# Patient Record
Sex: Female | Born: 1950 | Race: Black or African American | Hispanic: No | Smoking: Never smoker
Health system: Southern US, Community
[De-identification: ages and names within clinical notes are randomized; demographics above are authoritative.]

## PROBLEM LIST (undated history)

## (undated) DIAGNOSIS — R102 Pelvic and perineal pain unspecified side: Secondary | ICD-10-CM

## (undated) DIAGNOSIS — K219 Gastro-esophageal reflux disease without esophagitis: Secondary | ICD-10-CM

## (undated) DIAGNOSIS — F419 Anxiety disorder, unspecified: Secondary | ICD-10-CM

## (undated) DIAGNOSIS — I6529 Occlusion and stenosis of unspecified carotid artery: Secondary | ICD-10-CM

## (undated) DIAGNOSIS — Z9889 Other specified postprocedural states: Secondary | ICD-10-CM

## (undated) DIAGNOSIS — G8929 Other chronic pain: Secondary | ICD-10-CM

## (undated) DIAGNOSIS — K589 Irritable bowel syndrome without diarrhea: Secondary | ICD-10-CM

## (undated) DIAGNOSIS — K449 Diaphragmatic hernia without obstruction or gangrene: Secondary | ICD-10-CM

## (undated) DIAGNOSIS — K2970 Gastritis, unspecified, without bleeding: Secondary | ICD-10-CM

## (undated) DIAGNOSIS — R112 Nausea with vomiting, unspecified: Secondary | ICD-10-CM

## (undated) DIAGNOSIS — E039 Hypothyroidism, unspecified: Secondary | ICD-10-CM

## (undated) DIAGNOSIS — E669 Obesity, unspecified: Secondary | ICD-10-CM

## (undated) DIAGNOSIS — B9681 Helicobacter pylori [H. pylori] as the cause of diseases classified elsewhere: Secondary | ICD-10-CM

## (undated) DIAGNOSIS — I2510 Atherosclerotic heart disease of native coronary artery without angina pectoris: Secondary | ICD-10-CM

## (undated) HISTORY — DX: Pelvic and perineal pain unspecified side: R10.20

## (undated) HISTORY — DX: Anxiety disorder, unspecified: F41.9

## (undated) HISTORY — PX: COLONOSCOPY: SHX174

## (undated) HISTORY — DX: Gastro-esophageal reflux disease without esophagitis: K21.9

## (undated) HISTORY — PX: UPPER GASTROINTESTINAL ENDOSCOPY: SHX188

## (undated) HISTORY — DX: Obesity, unspecified: E66.9

## (undated) HISTORY — DX: Hypothyroidism, unspecified: E03.9

## (undated) HISTORY — DX: Irritable bowel syndrome without diarrhea: K58.9

## (undated) HISTORY — PX: OTHER SURGICAL HISTORY: SHX169

## (undated) HISTORY — DX: Pelvic and perineal pain: R10.2

## (undated) HISTORY — PX: LAPAROSCOPY: SHX197

## (undated) HISTORY — DX: Gastritis, unspecified, without bleeding: K29.70

## (undated) HISTORY — PX: SALPINGOOPHORECTOMY: SHX82

## (undated) HISTORY — DX: Other chronic pain: G89.29

## (undated) HISTORY — DX: Helicobacter pylori (H. pylori) as the cause of diseases classified elsewhere: B96.81

## (undated) HISTORY — PX: ABDOMINAL HYSTERECTOMY: SHX81

## (undated) HISTORY — DX: Diaphragmatic hernia without obstruction or gangrene: K44.9

---

## 1997-11-15 ENCOUNTER — Ambulatory Visit (HOSPITAL_COMMUNITY): Admission: RE | Admit: 1997-11-15 | Discharge: 1997-11-15 | Payer: Self-pay | Admitting: Gastroenterology

## 1998-08-14 ENCOUNTER — Other Ambulatory Visit: Admission: RE | Admit: 1998-08-14 | Discharge: 1998-08-14 | Payer: Self-pay | Admitting: *Deleted

## 1999-06-14 ENCOUNTER — Encounter: Admission: RE | Admit: 1999-06-14 | Discharge: 1999-06-14 | Payer: Self-pay | Admitting: Obstetrics and Gynecology

## 1999-06-14 ENCOUNTER — Encounter: Payer: Self-pay | Admitting: Obstetrics and Gynecology

## 1999-11-21 ENCOUNTER — Encounter: Payer: Self-pay | Admitting: Internal Medicine

## 1999-11-21 ENCOUNTER — Ambulatory Visit (HOSPITAL_COMMUNITY): Admission: RE | Admit: 1999-11-21 | Discharge: 1999-11-21 | Payer: Self-pay | Admitting: Internal Medicine

## 2000-06-15 ENCOUNTER — Encounter: Payer: Self-pay | Admitting: Obstetrics and Gynecology

## 2000-06-15 ENCOUNTER — Encounter: Admission: RE | Admit: 2000-06-15 | Discharge: 2000-06-15 | Payer: Self-pay | Admitting: Obstetrics and Gynecology

## 2001-03-30 ENCOUNTER — Emergency Department (HOSPITAL_COMMUNITY): Admission: EM | Admit: 2001-03-30 | Discharge: 2001-03-30 | Payer: Self-pay | Admitting: *Deleted

## 2001-05-25 ENCOUNTER — Encounter: Admission: RE | Admit: 2001-05-25 | Discharge: 2001-05-25 | Payer: Self-pay | Admitting: Internal Medicine

## 2001-05-25 ENCOUNTER — Encounter: Payer: Self-pay | Admitting: Internal Medicine

## 2001-06-15 ENCOUNTER — Other Ambulatory Visit: Admission: RE | Admit: 2001-06-15 | Discharge: 2001-06-15 | Payer: Self-pay | Admitting: Obstetrics and Gynecology

## 2001-06-18 ENCOUNTER — Encounter: Admission: RE | Admit: 2001-06-18 | Discharge: 2001-06-18 | Payer: Self-pay | Admitting: Obstetrics and Gynecology

## 2001-06-18 ENCOUNTER — Encounter: Admission: RE | Admit: 2001-06-18 | Discharge: 2001-06-18 | Payer: Self-pay | Admitting: Internal Medicine

## 2001-06-18 ENCOUNTER — Encounter: Payer: Self-pay | Admitting: Obstetrics and Gynecology

## 2001-06-30 DIAGNOSIS — K589 Irritable bowel syndrome without diarrhea: Secondary | ICD-10-CM

## 2001-06-30 DIAGNOSIS — K449 Diaphragmatic hernia without obstruction or gangrene: Secondary | ICD-10-CM

## 2001-06-30 HISTORY — DX: Irritable bowel syndrome, unspecified: K58.9

## 2001-06-30 HISTORY — DX: Diaphragmatic hernia without obstruction or gangrene: K44.9

## 2001-08-11 ENCOUNTER — Ambulatory Visit (HOSPITAL_COMMUNITY): Admission: RE | Admit: 2001-08-11 | Discharge: 2001-08-11 | Payer: Self-pay | Admitting: Internal Medicine

## 2001-08-18 ENCOUNTER — Ambulatory Visit (HOSPITAL_COMMUNITY): Admission: RE | Admit: 2001-08-18 | Discharge: 2001-08-18 | Payer: Self-pay | Admitting: Cardiology

## 2001-08-18 ENCOUNTER — Encounter: Payer: Self-pay | Admitting: Cardiology

## 2002-02-02 ENCOUNTER — Encounter: Payer: Self-pay | Admitting: Gastroenterology

## 2002-02-02 ENCOUNTER — Encounter: Admission: RE | Admit: 2002-02-02 | Discharge: 2002-02-02 | Payer: Self-pay | Admitting: Gastroenterology

## 2002-03-29 ENCOUNTER — Observation Stay (HOSPITAL_COMMUNITY): Admission: RE | Admit: 2002-03-29 | Discharge: 2002-03-30 | Payer: Self-pay | Admitting: General Surgery

## 2002-06-21 ENCOUNTER — Encounter: Payer: Self-pay | Admitting: Obstetrics and Gynecology

## 2002-06-21 ENCOUNTER — Encounter: Admission: RE | Admit: 2002-06-21 | Discharge: 2002-06-21 | Payer: Self-pay | Admitting: Obstetrics and Gynecology

## 2002-06-21 ENCOUNTER — Other Ambulatory Visit: Admission: RE | Admit: 2002-06-21 | Discharge: 2002-06-21 | Payer: Self-pay | Admitting: Obstetrics and Gynecology

## 2002-12-05 ENCOUNTER — Encounter: Payer: Self-pay | Admitting: Internal Medicine

## 2002-12-05 ENCOUNTER — Encounter: Admission: RE | Admit: 2002-12-05 | Discharge: 2002-12-05 | Payer: Self-pay | Admitting: Internal Medicine

## 2003-06-14 ENCOUNTER — Other Ambulatory Visit: Admission: RE | Admit: 2003-06-14 | Discharge: 2003-06-14 | Payer: Self-pay | Admitting: Obstetrics and Gynecology

## 2003-07-19 ENCOUNTER — Encounter: Admission: RE | Admit: 2003-07-19 | Discharge: 2003-07-19 | Payer: Self-pay | Admitting: Obstetrics and Gynecology

## 2003-10-27 ENCOUNTER — Ambulatory Visit (HOSPITAL_COMMUNITY): Admission: RE | Admit: 2003-10-27 | Discharge: 2003-10-27 | Payer: Self-pay | Admitting: Internal Medicine

## 2003-10-27 ENCOUNTER — Encounter (INDEPENDENT_AMBULATORY_CARE_PROVIDER_SITE_OTHER): Payer: Self-pay | Admitting: *Deleted

## 2004-02-15 ENCOUNTER — Encounter: Admission: RE | Admit: 2004-02-15 | Discharge: 2004-02-15 | Payer: Self-pay | Admitting: Obstetrics & Gynecology

## 2004-08-19 ENCOUNTER — Encounter: Admission: RE | Admit: 2004-08-19 | Discharge: 2004-08-19 | Payer: Self-pay | Admitting: Obstetrics and Gynecology

## 2005-08-01 ENCOUNTER — Encounter: Admission: RE | Admit: 2005-08-01 | Discharge: 2005-08-01 | Payer: Self-pay | Admitting: Obstetrics and Gynecology

## 2005-08-08 ENCOUNTER — Encounter: Admission: RE | Admit: 2005-08-08 | Discharge: 2005-08-08 | Payer: Self-pay | Admitting: Obstetrics and Gynecology

## 2005-08-26 ENCOUNTER — Ambulatory Visit: Payer: Self-pay | Admitting: Internal Medicine

## 2005-08-27 ENCOUNTER — Ambulatory Visit: Payer: Self-pay | Admitting: Internal Medicine

## 2005-08-27 ENCOUNTER — Encounter (INDEPENDENT_AMBULATORY_CARE_PROVIDER_SITE_OTHER): Payer: Self-pay | Admitting: Specialist

## 2006-08-20 ENCOUNTER — Encounter: Admission: RE | Admit: 2006-08-20 | Discharge: 2006-08-20 | Payer: Self-pay | Admitting: Obstetrics and Gynecology

## 2006-09-14 ENCOUNTER — Ambulatory Visit (HOSPITAL_COMMUNITY): Admission: RE | Admit: 2006-09-14 | Discharge: 2006-09-14 | Payer: Self-pay | Admitting: Gastroenterology

## 2006-09-14 ENCOUNTER — Ambulatory Visit: Payer: Self-pay | Admitting: Gastroenterology

## 2006-09-16 ENCOUNTER — Emergency Department (HOSPITAL_COMMUNITY): Admission: EM | Admit: 2006-09-16 | Discharge: 2006-09-16 | Payer: Self-pay | Admitting: Emergency Medicine

## 2007-07-01 DIAGNOSIS — E039 Hypothyroidism, unspecified: Secondary | ICD-10-CM

## 2007-07-01 HISTORY — DX: Hypothyroidism, unspecified: E03.9

## 2007-09-09 ENCOUNTER — Encounter: Admission: RE | Admit: 2007-09-09 | Discharge: 2007-09-09 | Payer: Self-pay | Admitting: Obstetrics and Gynecology

## 2007-09-22 ENCOUNTER — Encounter: Admission: RE | Admit: 2007-09-22 | Discharge: 2007-09-22 | Payer: Self-pay | Admitting: Family Medicine

## 2008-05-24 ENCOUNTER — Ambulatory Visit (HOSPITAL_COMMUNITY): Admission: RE | Admit: 2008-05-24 | Discharge: 2008-05-24 | Payer: Self-pay | Admitting: Family Medicine

## 2008-09-11 ENCOUNTER — Encounter: Admission: RE | Admit: 2008-09-11 | Discharge: 2008-09-11 | Payer: Self-pay | Admitting: Obstetrics and Gynecology

## 2009-01-18 ENCOUNTER — Emergency Department (HOSPITAL_COMMUNITY): Admission: EM | Admit: 2009-01-18 | Discharge: 2009-01-18 | Payer: Self-pay | Admitting: Emergency Medicine

## 2009-06-07 ENCOUNTER — Encounter (INDEPENDENT_AMBULATORY_CARE_PROVIDER_SITE_OTHER): Payer: Self-pay | Admitting: *Deleted

## 2009-06-07 ENCOUNTER — Ambulatory Visit: Payer: Self-pay | Admitting: Gastroenterology

## 2009-06-07 ENCOUNTER — Telehealth (INDEPENDENT_AMBULATORY_CARE_PROVIDER_SITE_OTHER): Payer: Self-pay

## 2009-06-07 DIAGNOSIS — R1013 Epigastric pain: Secondary | ICD-10-CM

## 2009-06-07 DIAGNOSIS — R1310 Dysphagia, unspecified: Secondary | ICD-10-CM | POA: Insufficient documentation

## 2009-06-07 DIAGNOSIS — R1084 Generalized abdominal pain: Secondary | ICD-10-CM | POA: Insufficient documentation

## 2009-06-07 DIAGNOSIS — K3189 Other diseases of stomach and duodenum: Secondary | ICD-10-CM | POA: Insufficient documentation

## 2009-06-07 DIAGNOSIS — K219 Gastro-esophageal reflux disease without esophagitis: Secondary | ICD-10-CM | POA: Insufficient documentation

## 2009-06-08 ENCOUNTER — Encounter: Payer: Self-pay | Admitting: Gastroenterology

## 2009-06-18 ENCOUNTER — Ambulatory Visit: Payer: Self-pay | Admitting: Gastroenterology

## 2009-06-18 ENCOUNTER — Ambulatory Visit (HOSPITAL_COMMUNITY): Admission: RE | Admit: 2009-06-18 | Discharge: 2009-06-18 | Payer: Self-pay | Admitting: Gastroenterology

## 2009-06-21 ENCOUNTER — Encounter: Payer: Self-pay | Admitting: Gastroenterology

## 2009-06-28 ENCOUNTER — Telehealth (INDEPENDENT_AMBULATORY_CARE_PROVIDER_SITE_OTHER): Payer: Self-pay

## 2009-06-28 ENCOUNTER — Telehealth (INDEPENDENT_AMBULATORY_CARE_PROVIDER_SITE_OTHER): Payer: Self-pay | Admitting: *Deleted

## 2009-07-31 ENCOUNTER — Ambulatory Visit: Payer: Self-pay | Admitting: Gastroenterology

## 2009-08-20 ENCOUNTER — Ambulatory Visit (HOSPITAL_COMMUNITY): Admission: RE | Admit: 2009-08-20 | Discharge: 2009-08-20 | Payer: Self-pay | Admitting: Family Medicine

## 2009-09-13 ENCOUNTER — Encounter: Admission: RE | Admit: 2009-09-13 | Discharge: 2009-09-13 | Payer: Self-pay | Admitting: Obstetrics and Gynecology

## 2009-11-05 ENCOUNTER — Encounter: Payer: Self-pay | Admitting: Physician Assistant

## 2009-11-08 ENCOUNTER — Encounter: Payer: Self-pay | Admitting: Physician Assistant

## 2009-11-08 ENCOUNTER — Encounter: Admission: RE | Admit: 2009-11-08 | Discharge: 2009-11-08 | Payer: Self-pay | Admitting: Family Medicine

## 2009-11-09 ENCOUNTER — Ambulatory Visit: Payer: Self-pay | Admitting: Family Medicine

## 2009-11-09 DIAGNOSIS — E039 Hypothyroidism, unspecified: Secondary | ICD-10-CM | POA: Insufficient documentation

## 2009-11-09 DIAGNOSIS — R259 Unspecified abnormal involuntary movements: Secondary | ICD-10-CM | POA: Insufficient documentation

## 2009-11-13 ENCOUNTER — Telehealth: Payer: Self-pay | Admitting: Physician Assistant

## 2009-12-13 ENCOUNTER — Encounter (INDEPENDENT_AMBULATORY_CARE_PROVIDER_SITE_OTHER): Payer: Self-pay | Admitting: *Deleted

## 2009-12-17 ENCOUNTER — Encounter: Payer: Self-pay | Admitting: Physician Assistant

## 2009-12-17 ENCOUNTER — Ambulatory Visit: Payer: Self-pay | Admitting: Family Medicine

## 2009-12-17 DIAGNOSIS — G479 Sleep disorder, unspecified: Secondary | ICD-10-CM | POA: Insufficient documentation

## 2009-12-17 DIAGNOSIS — F411 Generalized anxiety disorder: Secondary | ICD-10-CM | POA: Insufficient documentation

## 2010-01-01 ENCOUNTER — Ambulatory Visit: Admission: RE | Admit: 2010-01-01 | Discharge: 2010-01-01 | Payer: Self-pay | Admitting: Family Medicine

## 2010-01-16 ENCOUNTER — Telehealth: Payer: Self-pay | Admitting: Family Medicine

## 2010-02-11 ENCOUNTER — Ambulatory Visit: Payer: Self-pay | Admitting: Gastroenterology

## 2010-02-11 DIAGNOSIS — R109 Unspecified abdominal pain: Secondary | ICD-10-CM | POA: Insufficient documentation

## 2010-03-12 ENCOUNTER — Ambulatory Visit: Payer: Self-pay | Admitting: Family Medicine

## 2010-03-12 DIAGNOSIS — M542 Cervicalgia: Secondary | ICD-10-CM | POA: Insufficient documentation

## 2010-03-12 DIAGNOSIS — M79609 Pain in unspecified limb: Secondary | ICD-10-CM | POA: Insufficient documentation

## 2010-03-14 ENCOUNTER — Encounter: Payer: Self-pay | Admitting: Physician Assistant

## 2010-04-10 ENCOUNTER — Telehealth (INDEPENDENT_AMBULATORY_CARE_PROVIDER_SITE_OTHER): Payer: Self-pay

## 2010-05-02 ENCOUNTER — Telehealth: Payer: Self-pay | Admitting: Family Medicine

## 2010-05-15 ENCOUNTER — Encounter (INDEPENDENT_AMBULATORY_CARE_PROVIDER_SITE_OTHER): Payer: Self-pay | Admitting: *Deleted

## 2010-06-11 ENCOUNTER — Ambulatory Visit: Payer: Self-pay | Admitting: Gastroenterology

## 2010-06-11 DIAGNOSIS — R1013 Epigastric pain: Secondary | ICD-10-CM | POA: Insufficient documentation

## 2010-07-26 ENCOUNTER — Encounter: Payer: Self-pay | Admitting: Gastroenterology

## 2010-08-01 NOTE — Letter (Signed)
Summary: Out of Work Note  [facility]  [address]   [place], [place] [address]   Phone: [phone]  Fax: [phone]    06/07/2009  TO: WHOM IT MAY CONCERN  RE: Kim Grimes [address] [place] 01/03/1951       The above named individual is currently under my care and will be out of work    FROM: 06/07/2009   THROUGH: 06/08/2009    REASON: Under Doctor's care.    MAY RETURN ON: 06/08/09     If you have any further questions or need additional information, please call.     Sincerely,     [facility] R. Roetta Sessions, M.D.    Kassie Mends, M.D. Lorenza Burton, FNP-BC    Tana Coast, PA-C Phone: [phone]    Fax: [phone]

## 2010-08-01 NOTE — Assessment & Plan Note (Signed)
Summary: EPIGSTRIC PAIN,GERD   Visit Type:  Follow-up Visit Primary Care Provider:  Lodema Hong, M.D.  Chief Complaint:  follow up visit- still having burning in throat and stomach and pain when eating.  History of Present Illness: C/o burning in her stomach after eating. Feeling full after eating. Nausea: 1-2x/week. No vomiting. Feels like things bubble up. Sun: only had salmon and baked potatoes, calorie: Dr. Reino Kent, [place] Si Tea: Verdis Frederickson, Caffeine. Felt baking soda made her feel better. PT CONCERNED HER HIATAL HERNIA IS CAUSING PAIN.  Current Medications (verified): 1)  Estradiol 1 Mg Tabs (Estradiol) .... One Tab By Mouth Once Daily 2)  Levothroid 75 Mcg Tabs (Levothyroxine Sodium) .... One Tab By Mouth Once Daily 3)  Norel Sr 8-50-40-325 Mg Xr12h-Tab (Chlorphen-Phenyltolox-Pe-Apap) .... One Tab By Mouth Two Times A Day For Congestion As Needed 4)  Diazepam 2 Mg Tabs (Diazepam) .... Take 1-2 Tabs Every 8 Hrs As Needed For Muscle Spasms 5)  Paroxetine Hcl 20 Mg Tabs (Paroxetine Hcl) .... Take 1 At Bedtime As Needed 6)  Lansoprazole 30 Mg Cpdr (Lansoprazole) .... Two Times A Day  Allergies (verified): 1)  ! Steroids 2)  ! Valium  Past History:  Past Surgical History: Last updated: 06/07/2009 Hysterectomy 2003 LAPAROSCOPY/ADHESIOLYSIS: dense adhesion between rectum and sigmoid, & pelvis SALPINGOOPHORECTOMY Bilateral R FOOT SX   Past Medical History: IBS 2003 Anxiety Disorder Endometriosis/CHRONIC PELVIC PAIN OSTEOPOROSIS H. pylori gastritis (Dr. Juanda Chance) HIATAL HERNIA ON UGI 2003 TCS 2003: abd pain/diarhea-WNLS TCS 2008: WNLs Hypothyroidism GERD  Vital Signs:  Patient profile:   60 year old female Height:      59 inches Weight:      145 pounds BMI:     29.39 Temp:     97.7 degrees F oral Pulse rate:   64 / minute BP sitting:   104 / 78  (left arm) Cuff size:   regular  Vitals Entered By: Hendricks Limes LPN (June 11, 2010 8:23 AM)  Physical Exam  General:  Well  developed, well nourished, no acute distress. Head:  Normocephalic and atraumatic. Lungs:  Clear throughout to auscultation. Heart:  Regular rate and rhythm; no murmurs. Abdomen:  Soft, mild TTP in epigastrium and suprapubic area w/o rebound or guarding, nondistended. No masses, hepatosplenomegaly or hernias noted. Normal bowel sounds.  Impression & Recommendations:  Problem # 1:  EPIGASTRIC PAIN (ICD-789.06) Assessment Unchanged likley 2o to IBS and/or uncontrolled GERD. The differential diagnosis includes non-ulcer dypspesia, OR less likely intermittent SBO from adhesions. Weight stable. Pt advised against caffeine and carbonated beverages. Continue two times a day PPI and Paroxetine.   Problem # 2:  GERD (ICD-530.81) Assessment: Improved Low fat diet. two times a day PPI. OPV in 6 mos.  CC: PCP  Appended Document: Orders Update    Clinical Lists Changes  Orders: Added new Service order of Est. Patient Level III (95284) - Signed

## 2010-08-01 NOTE — Progress Notes (Signed)
Summary: reflux and hemorroids  Phone Note Call from Patient Call back at Home Phone [phone]   Caller: Patient Summary of Call: pt called- she is having alot of burning in her throat. she is taking omeprazole three times a day now and its not helping. wants to know if she can try something else. pt has already tried nexium and her insurance will not pay for aciphex. She has BCBS and it will only pay for generics.   pt also got constipated last week and had bleeding with her hemorroids x 1 day. She is having alot of burning now with bm.   Pt would like generic ppi and something for hemorroids sent to [facility] [address] [place]. please advise Initial call taken by: Hendricks Limes LPN,  April 10, 2010 2:01 PM     Appended Document: reflux and hemorroids Please call pt. She may use Prevacid 30 mg 30 minutes before meals for two times a day one month then one 30 minutes before her first meal, #60 rfx5. She should follow a full lqiuid diet for 3 days the a low fat diet. For her hemorrhoidal pain she may have Anusol HC 1 pr two times a day for 10 days, #20, rfx0.  Appended Document: reflux and hemorroids Rx called to [facility] [place]-[phone]  Appended Document: reflux and hemorroids pt called- [facility]- call to [phone]. Rxs called in

## 2010-08-01 NOTE — Letter (Signed)
Summary: EGD/ED ORDER  EGD/ED ORDER   Imported By: Ave Filter 06/08/2009 13:08:06  _____________________________________________________________________  External Attachment:    Type:   Image     Comment:   External Document

## 2010-08-01 NOTE — Progress Notes (Signed)
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 can she have a statement that it is not safe? Additional Follow-up by: Adella Hare LPN,  May 02, 2010 2:31 PM    Additional Follow-up for Phone Call Additional follow up Details #2::    pls let her know no neurology repts on file to establish this , she  needs this dx established review of record stated that this was to be obtained. Alternatively to get done faster I suggest she just call the neurologist directly for the letter Follow-up by: [name] MD,  May 02, 2010 5:21 PM  Additional Follow-up for Phone Call Additional follow up Details #3:: Details for Additional Follow-up Action Taken: patient aware, states that [facility] had put her on paroxetine, wants to know if it is safe i did advise to call neurologist but she is still asking questions Additional Follow-up by: Adella Hare LPN,  May 03, 2010 3:57 PM  she needs to have all "safety" questions re her job answered by the neurologist patient calling neurologist Adella Hare LPN  May 06, 2010 2:49 PM

## 2010-08-01 NOTE — Progress Notes (Signed)
Summary: CTA Request  ---- Converted from flag ---- ---- 06/28/2009 10:16 AM, West Bali MD wrote: No will wait for pt's OPV to reassess.  ---- 06/27/2009 4:44 PM, Ave Filter wrote: Your office note states pt needs CTA pancreatic protocol..Do you still want her to have this done? ------------------------------

## 2010-08-01 NOTE — Progress Notes (Signed)
Summary: pt seeing two family practices  Phone Note Call from Patient   Summary of Call: this pt just saw dr. hill on 11/05/2009. he is family practice to. so she is seeing [place] and him  shenika from dr. hills office called to let us know.  Initial call taken by: Rudene Anda,  Nov 13, 2009 9:21 AM  Follow-up for Phone Call        Pls call them back & let them know I think she came here more out of fear & for another opinion.   If she comes to [place] again I'll let her know she needs to choose, & can't have 2 family practice docs. Follow-up by: Esperanza Sheets PA,  Nov 13, 2009 11:56 AM  Additional Follow-up for Phone Call Additional follow up Details #1::        called dr. hills office and explained and they are okay with this.  Additional Follow-up by: Rudene Anda,  Nov 13, 2009 1:40 PM

## 2010-08-01 NOTE — Assessment & Plan Note (Signed)
Summary: GERD/ABD PAIN   Visit Type:  Follow-up Visit Primary Care Provider:  Mirna Mires, M.D.  Chief Complaint:  F/U GERD/ABD PAIN.  History of Present Illness: Been exercising. Lost 5 lbs. Concerned about pain in left flank. Counting calories. Gave up coffee. Thyroid medicine adjusted. Taking omeprazole two times a day. Wants more probiotics GDF.  Current Medications (verified): 1)  Fosamax 70 Mg Tabs (Alendronate Sodium) .Marland Kitchen.. 1 Oral Tablet Once A Week 2)  Estradiol .... 1mg   By Mouth Once Daily 3)  Omeprazole 20 Mg Cpdr (Omeprazole) .... Take 1 Tablet By Mouth 30 Minutes Before Breakfast and Supper 4)  Synthroid 88 Mcg Tabs (Levothyroxine Sodium) .... Take 1 Tablet By Mouth Once A Day  Allergies (verified): No Known Drug Allergies  Past History:  Past Medical History: Last updated: 06/07/2009 IBS 2003 Anxiety Disorder Endometriosis/CHRONIC PELVIC PAIN OSTEOPOROSIS H. pylori gastritis (Dr. Juanda Chance) TCS 2003: abd pain/diarhea-WNLS TCS 2008: WNLs  Vital Signs:  Patient profile:   60 year old female Height:      58.5 inches Weight:      143 pounds BMI:     29.48 Temp:     98.1 degrees F oral Pulse rate:   72 / minute BP sitting:   120 / 80  (right arm) Cuff size:   regular  Vitals Entered By: Cloria Spring LPN (July 31, 2009 9:48 AM)  Physical Exam  General:  Well developed, well nourished, no acute distress. Head:  Normocephalic and atraumatic. Lungs:  Clear throughout to auscultation. Heart:  Regular rate and rhythm; no murmurs Abdomen:  Soft, nondistended. No masses, or hernias noted. Normal bowel sounds. Mild TTP in LUQ without guarding and without rebound.    Impression & Recommendations:  Problem # 1:  GASTROESOPHAGEAL REFLUX DISEASE (ICD-530.81) Assessment Improved  Continue OMP two times a day. OPV in 4 mos.  Orders: Est. Patient Level II (04540)  Problem # 2:  ABDOMINAL PAIN, GENERALIZED, CHRONIC (ICD-789.07) Assessment: Unchanged  c/o  Left flank pain and mild LUQ TTP likely 2o to increased physical activity. DA-GDF #32 given.  CC: PCP  Orders: Est. Patient Level II ([id number])

## 2010-08-01 NOTE — Assessment & Plan Note (Signed)
Summary: office visit   Vital Signs:  Patient profile:   60 year old female Height:      59 inches Weight:      144.75 pounds O2 Sat:      98 % on Room air Pulse rate:   73 / minute BP sitting:   122 / 78  (right arm) CC: follow up and states her left arm hurt for about a week and her face went a little numb, not feeling this way now, off and on for awhile, not sure if its how she is sitting at work or not. She is also interested in getting checked for diabeties it runs in her family. room 1 Is Patient Diabetic? No   Primary Provider:  Esperanza Sheets, PA  CC:  follow up and states her left arm hurt for about a week and her face went a little numb, not feeling this way now, off and on for awhile, and not sure if its how she is sitting at work or not. She is also interested in getting checked for diabeties it runs in her family. room 1.  History of Present Illness: Pt reports pain in her entire Lt arm from shoulder to fingers last week.  Is better this week.  Seemed to be aggrevated by leaning on Lt arm.  Also had numbness in the Lt side of her face last week.  Pt states the symptoms were persistent, not episodic.  She had normal sensation to touch in her face, but states it felt numb when she wasnt touching it.  She denies chest pain, palpitations, difficulty breathing, or diaphoresis. Hx of intermittent neck pain.  Pt points to Lt C6-7 area.  No trauman.  Also recently dx with anxiety d/o.  Had muscle jerking episodes.  Head CT and neurological eval neg.  Was told by neurologist that this was due to anxiety and no need to return. She has a prescription for Paxil but is not taking it.  States the episodes of muscle jerking have improved.  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    Allergies: 1)  ! Steroids 2)  ! Valium  Past History:  Past medical history reviewed for relevance to current acute and chronic problems.  Past Medical  History: Reviewed history from 11/09/2009 and no changes required. IBS 2003 Anxiety Disorder Endometriosis/CHRONIC PELVIC PAIN OSTEOPOROSIS H. pylori gastritis (Dr. Juanda Chance) TCS 2003: abd pain/diarhea-WNLS TCS 2008: WNLs Hypothyroidism GERD  Review of Systems General:  Denies chills and fever. CV:  Denies chest pain or discomfort, lightheadness, and palpitations. Resp:  Denies shortness of breath. MS:  Complains of muscle aches; denies joint pain, joint swelling, loss of strength, low back pain, and mid back pain. Neuro:  Complains of numbness; denies headaches, tingling, and visual disturbances. Psych:  Complains of anxiety.  Physical Exam  General:  Well-developed,well-nourished,in no acute distress; alert,appropriate and cooperative throughout examination Head:  Normocephalic and atraumatic without obvious abnormalities. No apparent alopecia or balding. Ears:  External ear exam shows no significant lesions or deformities.  Otoscopic examination reveals clear canals, tympanic membranes are intact bilaterally without bulging, retraction, inflammation or discharge. Hearing is grossly normal bilaterally. Nose:  External nasal examination shows no deformity or inflammation. Nasal mucosa are pink and moist without lesions or exudates. Mouth:  Oral mucosa and oropharynx without lesions or exudates.   Neck:  No deformities, masses, or tenderness noted. Lungs:  Normal respiratory effort, chest expands symmetrically.  Lungs are clear to auscultation, no crackles or wheezes. Heart:  Normal rate and regular rhythm. S1 and S2 normal without gallop, murmur, click, rub or other extra sounds. Msk:  LUE:  normal ROM, no joint tenderness, no joint swelling, no joint deformities, no joint instability, and no crepitation.   CSPine:  mild TTP Lt C6-7.  FROM. Pulses:  R radial normal and L radial normal.   Extremities:  No clubbing, cyanosis, edema, or deformity noted with normal full range of motion of  all joints.   Neurologic:  alert & oriented X3, strength normal in all extremities, sensation intact to light touch, gait normal, and DTRs symmetrical and normal.   Skin:  Intact without suspicious lesions or rashes Cervical Nodes:  No lymphadenopathy noted Psych:  Cognition and judgment appear intact. Alert and cooperative with normal attention span and concentration. No apparent delusions, illusions, hallucinations   Impression & Recommendations:  Problem # 1:  NECK PAIN, CHRONIC (ICD-723.1) Assessment Comment Only  Orders: Miscellaneous Other Radiology (Misc Other Rad)  Problem # 2:  ARM PAIN, LEFT (ICD-729.5) Assessment: Improved  Orders: EKG w/ Interpretation (93000)  Problem # 3:  ANXIETY DISORDER (ICD-300.00) Assessment: Unchanged Encouraged pt to take Paxil daily.  Her updated medication list for this problem includes:    Diazepam 2 Mg Tabs (Diazepam) .Marland Kitchen... Take 1-2 tabs every 8 hrs as needed for muscle spasms    Paroxetine Hcl 20 Mg Tabs (Paroxetine hcl) .Marland Kitchen... Take 1 at bedtime as needed  Complete Medication List: 1)  Omeprazole 20 Mg Cpdr (Omeprazole) .... One cap by mouth two times a day 2)  Estradiol 1 Mg Tabs (Estradiol) .... One tab by mouth once daily 3)  Levothroid 75 Mcg Tabs (Levothyroxine sodium) .... One tab by mouth once daily 4)  Norel Sr 8-50-40-325 Mg Xr12h-tab (Chlorphen-phenyltolox-pe-apap) .... One tab by mouth two times a day for congestion 5)  Diazepam 2 Mg Tabs (Diazepam) .... Take 1-2 tabs every 8 hrs as needed for muscle spasms 6)  Paroxetine Hcl 20 Mg Tabs (Paroxetine hcl) .... Take 1 at bedtime as needed  Patient Instructions: 1)  Please schedule a follow-up appointment in 1 month. 2)  Have xray of your neck done today. 3)  Take Tylenol or Aleve as needed for neck or Lt arm pain. 4)  Take your Paroxetine every day at bedtime.  This is for anxiety.

## 2010-08-01 NOTE — Progress Notes (Signed)
Summary: sleep study  Phone Note Call from Patient   Summary of Call: results of sleep study [phone] ext [phone] Initial call taken by: Rudene Anda,  January 16, 2010 10:05 AM  Follow-up for Phone Call        patient aware study was unremarkable Follow-up by: Adella Hare LPN,  January 16, 2010 11:46 AM

## 2010-08-01 NOTE — Assessment & Plan Note (Signed)
Summary: DIFFICULTY SWALLOWING & ABD PAIN- CG   Visit Type:  Follow-up Visit Primary Care Provider:  Mirna Mires, M.D.  Chief Complaint:  difficulty swallowing/ abd pain.  History of Present Illness: Pt c/o bloating and regurgitation. Sx been bothering her for 1-2 years. Also c/o difficulty swalllowing. Insurance won't pay for Aciphex. Feels heartburn: fluid bubbling back up. When swallows solid food, it hurts in middle underneath ribs and then liquid comes. Sx usu last  ~5-10 minutes. Has Sx whenever she eats. Sx have gotten worse over the past 5 years. Nausea: twice a week. No vomiting or weight loss. Exercises 3-4 x/wk and sometimes 2x/day. Been on Fosamax over 5 years. Sometimes triggers a bowel movement.  Bowels have changed. Stools were pasty, normal, or diarrhea. Stoppped spicy. Can't tie it down to a food. No milk. Eats ice cream 2x/mo/ Cheese: 2x/mo. Can't drink Pepsi or Coca Cola. Pain in LLQ with the diarrhea and it has happened for about 20 years ago after she had a hysterectomy. No blood in stool, no black tarry stool.   No ASA, BC, Goody's, or Ibuprofen. Takes Aleve: last dose over one year ago.  Preventive Screening-Counseling & Management  Alcohol-Tobacco     Smoking Status: never  Current Medications (verified): 1)  Fosamax 70 Mg Tabs (Alendronate Sodium) .Marland Kitchen.. 1 Oral Tablet Once A Week 2)  Estradiol .... 1mg   By Mouth Once Daily 3)  Omeprazole 20 Mg Cpdr (Omeprazole) .... Take 1 Tablet By Mouth Once A Day 4)  Synthroid 75 Mcg Tabs (Levothyroxine Sodium) .... Take 1 Tablet By Mouth Once A Day  Allergies (verified): No Known Drug Allergies  Past History:  Past Medical History: IBS 2003 Anxiety Disorder Endometriosis/CHRONIC PELVIC PAIN OSTEOPOROSIS H. pylori gastritis (Dr. Juanda Chance) TCS 2003: abd pain/diarhea-WNLS TCS 2008: WNLs  Past Surgical History: Hysterectomy 2003 LAPAROSCOPY/ADHESIOLYSIS: dense adhesion between rectum and sigmoid, &  pelvis SALPINGOOPHORECTOMY Bilateral R FOOT SX   Family History: FH of Colon Cancer: DAD, age < 5 yo Family History of Pancreatic Cancer: MOTHER  Social History: Occupation: [employer] Patient has never smoked.  Alcohol Use - wine 2x/year 1 kid: 89, divorced Smoking Status:  never  Review of Systems       CT A/P w/ ivc: 2003, NAIAP, U/S: 2007-RIGHT HEPATIC CYST, NL PANCREAS  Per HPI otherwise all systems are negative.  Vital Signs:  Patient profile:   60 year old female Height:      58 inches Weight:      148.50 pounds BMI:     31.15 Temp:     98.0 degrees F oral Pulse rate:   72 / minute BP sitting:   110 / 80  (left arm) Cuff size:   regular  Vitals Entered By: Cloria Spring LPN (June 07, 2009 8:57 AM)  Physical Exam  General:  Well developed, well nourished, no acute distress. Head:  Normocephalic and atraumatic. Eyes:  PERRLA, no icterus. Mouth:  No deformity or lesions. Neck:  Supple; no masses. Lungs:  Clear throughout to auscultation. Heart:  Regular rate and rhythm; no murmurs, rubs,  or bruits. Abdomen:  Soft, nondistended. No masses noted. Normal bowel sounds. obese.  generalized TTP most pronounced in the epigastrium without rebound or guarding. Msk:  Symmetrical with no gross deformities. Normal posture. Extremities:  No cyanosis, edema or deformities noted. Neurologic:  Alert and  oriented x4;  grossly normal neurologically.  Impression & Recommendations:  Problem # 1:  DYSPEPSIA (ICD-536.8) Assessment New Most likely 2o to non-ulcer  dyspepsia, lactose intolerance, IBS-mixed pattern, uncontrolled GERD, NSAID gastritis and low likelihood of gastric/esophageal malignancy or refractory H. pylori. EGD planned. Lactose free diet.  Problem # 2:  DYSPHAGIA UNSPECIFIED (ICD-787.20) Assessment: New Most likely 2o to non-ulcer dyspepsia,uncontrolled GERD, and low likelihood of gastric/esophageal malignancy or peptic stricture or stricture 2o to Fosamax.  EGD/dilation planned.    Problem # 3:  ABDOMINAL PAIN, GENERALIZED, CHRONIC (ICD-789.07) Assessment: Unchanged Most likely 2o to non-ulcer dyspepsia, lactose intolerance, IBS-mixed pattern, gastritis and low likelihood of adhesions or pancreatic CA.  ADD PROBIOTICS DAILY. Last imaging 2007. EGD/dil and if no source for pain revealed. Pt needs CTA-pancreatic protocol. OPV in 2 mos. IF no stricture identified, could consider Bravo study.  Problem # 4:  GASTROESOPHAGEAL REFLUX DISEASE (ICD-530.81) Assessment: Deteriorated Sx most likely 2o to non-ulcer dyspepsia and low likelihood of uncontrolled GERD, gastritis,or gastric/esophageal malignancy or refractory H. pylori. Increase omeprazole to two times a day. Lose 10 lbs. WILL GIVE GERD HO AFTER EGD.  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  CC: Dr. Loleta Chance  Patient Instructions: 1)  Lactose free diet. See handout. 2)  Upper endoscopy and possible dilation within the next week. 3)  Take Omeprazole two times a day. 4)  Lose 10 lbs which will help your reflux and feeling full after eating. 5)  Add probiotics daily to prevent gas and bloating. 6)  Return in 2 mos. 7)  The medication list was reviewed and reconciled.  All changed / newly prescribed medications were explained.  A complete medication list was provided to the patient / caregiver. 8)  The medication list was reviewed and reconciled.  All changed / newly prescribed medications were explained.  A complete medication list was provided to the patient / caregiver. Prescriptions: OMEPRAZOLE 20 MG CPDR (OMEPRAZOLE) Take 1 tablet by mouth 30 minutes before breakfast and supper  #60 x 5   Entered and Authorized by:   West Bali MD   Signed by:   West Bali MD on 06/07/2009   Method used:   Electronically to        [facility]  [address] (retail)       [address]       [place], [place]   [address]       Ph: [phone]       Fax: [phone]   RxID:   [id number]      Appended Document: Orders Update-charge    Clinical Lists Changes  Orders: Added new Service order of Est. Patient Level V (84132) - Signed

## 2010-08-01 NOTE — Letter (Signed)
Summary: MEDICAL RELEASE  MEDICAL RELEASE   Imported By: Lind Guest 12/19/2009 13:02:10  _____________________________________________________________________  External Attachment:    Type:   Image     Comment:   External Document

## 2010-08-01 NOTE — Medication Information (Signed)
Summary: prevacid rx  prevacid rx   Imported By: Hendricks Limes LPN 56/21/3086 57:84:69  _____________________________________________________________________  External Attachment:    Type:   Image     Comment:   External Document

## 2010-08-01 NOTE — Assessment & Plan Note (Signed)
Summary: follow up- room 1   Vital Signs:  Patient profile:   60 year old female Height:      59 inches Weight:      146.50 pounds BMI:     29.70 O2 Sat:      99 % on Room air Pulse rate:   85 / minute Resp:     16 per minute BP sitting:   90 / 58  (left arm)  Vitals Entered By: Adella Hare LPN (December 17, 2009 4:00 PM) CC: follow up Is Patient Diabetic? No Comments states she is experiencing soreness in neck and left arm from jerking   Primary Provider:  Esperanza Sheets PA  CC:  follow up.  History of Present Illness: See 11-09-09 new pt OV. Pt is transferring care here from Dr Loleta Chance.  She has seen the neurologist.  States she was dx with a "nervous" condition.  Did not prescribe any meds, order any further tests  or change her medication.  [facility].  Was told to come back as needed.  Pt admits that she does not sleep well.  Awakens frequently though is able to get back to sleep.  She thinks she may be jerking and moving during the night because her neck is sore when she awakens in the morning.  She has no bed partners to comment on this.  She does snore and sometimes awakens herself.      Current Medications (verified): 1)  Omeprazole 20 Mg Cpdr (Omeprazole) .... One Cap By Mouth Two Times A Day 2)  Estradiol 1 Mg Tabs (Estradiol) .... One Tab By Mouth Once Daily 3)  Levothroid 75 Mcg Tabs (Levothyroxine Sodium) .... One Tab By Mouth Once Daily 4)  Norel Sr 8-50-40-325 Mg Xr12h-Tab (Chlorphen-Phenyltolox-Pe-Apap) .... One Tab By Mouth Two Times A Day For Congestion 5)  Diazepam 2 Mg Tabs (Diazepam) .... Take 1-2 Tabs Every 8 Hrs As Needed For Muscle Spasms  Allergies (verified): 1)  ! Steroids 2)  ! Valium  Past History:  Past medical history reviewed for relevance to current acute and chronic problems.  Past Medical History: Reviewed history from 11/09/2009 and no changes required. IBS 2003 Anxiety Disorder Endometriosis/CHRONIC PELVIC  PAIN OSTEOPOROSIS H. pylori gastritis (Dr. Juanda Chance) TCS 2003: abd pain/diarhea-WNLS TCS 2008: WNLs Hypothyroidism GERD  Review of Systems CV:  Denies chest pain or discomfort and palpitations. Resp:  Denies shortness of breath. MS:  Denies joint pain, low back pain, and mid back pain. Neuro:  Denies headaches, numbness, tingling, and tremors. Psych:  Complains of anxiety; denies depression, suicidal thoughts/plans, and thoughts /plans of harming others.  Physical Exam  General:  Well-developed,well-nourished,in no acute distress; alert,appropriate and cooperative throughout examination Head:  Normocephalic and atraumatic without obvious abnormalities. No apparent alopecia or balding. Ears:  External ear exam shows no significant lesions or deformities.  Otoscopic examination reveals clear canals, tympanic membranes are intact bilaterally without bulging, retraction, inflammation or discharge. Hearing is grossly normal bilaterally. Nose:  External nasal examination shows no deformity or inflammation. Nasal mucosa are pink and moist without lesions or exudates. Mouth:  Oral mucosa and oropharynx without lesions or exudates.  Teeth in good repair. Neck:  No deformities, masses, or tenderness noted. Lungs:  Normal respiratory effort, chest expands symmetrically. Lungs are clear to auscultation, no crackles or wheezes. Heart:  Normal rate and regular rhythm. S1 and S2 normal without gallop, murmur, click, rub or other extra sounds. Msk:  UE:  normal ROM, no joint  swelling, and no joint instability.   Pulses:  R radial normal and L radial normal.   Extremities:  No clubbing, cyanosis, edema, or deformity noted with normal full range of motion of all joints.   Neurologic:  alert & oriented X3, strength normal in all extremities, sensation intact to light touch, gait normal, and DTRs symmetrical and normal.   Cervical Nodes:  No lymphadenopathy noted Psych:  Cognition and judgment appear intact.  Alert and cooperative with normal attention span and concentration. No apparent delusions, illusions, hallucinations   Impression & Recommendations:  Problem # 1:  ANXIETY DISORDER (ICD-300.00) Assessment Unchanged  Her updated medication list for this problem includes:    Diazepam 2 Mg Tabs (Diazepam) .Marland Kitchen... Take 1-2 tabs every 8 hrs as needed for muscle spasms    Paroxetine Hcl 20 Mg Tabs (Paroxetine hcl) .Marland Kitchen... Take 1 at bedtime  Problem # 2:  SLEEP DISORDER (ICD-780.50) Assessment: Comment Only  Orders: Sleep Study (Sleep Study)  Problem # 3:  ABNORMAL INVOLUNTARY MOVEMENTS (ICD-781.0) Assessment: Unchanged Probably due to anxiety d/o.  Await neurology reports.  Complete Medication List: 1)  Omeprazole 20 Mg Cpdr (Omeprazole) .... One cap by mouth two times a day 2)  Estradiol 1 Mg Tabs (Estradiol) .... One tab by mouth once daily 3)  Levothroid 75 Mcg Tabs (Levothyroxine sodium) .... One tab by mouth once daily 4)  Norel Sr 8-50-40-325 Mg Xr12h-tab (Chlorphen-phenyltolox-pe-apap) .... One tab by mouth two times a day for congestion 5)  Diazepam 2 Mg Tabs (Diazepam) .... Take 1-2 tabs every 8 hrs as needed for muscle spasms 6)  Paroxetine Hcl 20 Mg Tabs (Paroxetine hcl) .... Take 1 at bedtime  Patient Instructions: 1)  Please schedule a follow-up appointment in 2 months. 2)  I have ordered a sleep study for you. 3)  I have prescribed Paxil (paroxetine) for you to take at bedtime.  This should help you sleep and will help with your nervous condition. Prescriptions: PAROXETINE HCL 20 MG TABS (PAROXETINE HCL) take 1 at bedtime  #30 x 2   Entered and Authorized by:   Esperanza Sheets PA   Signed by:   Esperanza Sheets PA on 12/17/2009   Method used:   Electronically to        [facility]  [address] (retail)       [address]       [place], [place]  [address]       Ph: [phone]       Fax: [phone]   RxID:   [id number]

## 2010-08-01 NOTE — Progress Notes (Signed)
  Phone Note Call from Patient   Caller: Patient Details for Reason: inquire about drinking tea Summary of Call: Pt said tea is on the list not to drink. She wants to know if it is OK to drink green tea or herbal tea. She is aware this might have to wait til next week. Initial call taken by: Cloria Spring LPN,  June 28, 2009 1:49 PM     Appended Document:  Call pt. She may have green or herbal tea.  Appended Document:  LMOM that pt can have green or herbal tea.  Appended Document:  Pt informed

## 2010-08-01 NOTE — Assessment & Plan Note (Signed)
Summary: ABD PAIN, GERD   Visit Type:  Follow-up Visit Primary Care Provider:  Esperanza Sheets, [place]  Chief Complaint:  Gerd/Abd pain.  History of Present Illness: c/o incision from Hsy hurting. Had lysis of adhesions. Ate fried fish and made her constipated. Hemorrhoid burning. Exercising: 2-3 times a week. Losing inches.   Current Medications (verified): 1)  Omeprazole 20 Mg Cpdr (Omeprazole) .... One Cap By Mouth Two Times A Day 2)  Estradiol 1 Mg Tabs (Estradiol) .... One Tab By Mouth Once Daily 3)  Levothroid 75 Mcg Tabs (Levothyroxine Sodium) .... One Tab By Mouth Once Daily 4)  Norel Sr 8-50-40-325 Mg Xr12h-Tab (Chlorphen-Phenyltolox-Pe-Apap) .... One Tab By Mouth Two Times A Day For Congestion 5)  Diazepam 2 Mg Tabs (Diazepam) .... Take 1-2 Tabs Every 8 Hrs As Needed For Muscle Spasms 6)  Paroxetine Hcl 20 Mg Tabs (Paroxetine Hcl) .... Take 1 At Bedtime As Needed  Allergies (verified): 1)  ! Steroids 2)  ! Valium  Past History:  Past Medical History: Last updated: 11/09/2009 IBS 2003 Anxiety Disorder Endometriosis/CHRONIC PELVIC PAIN OSTEOPOROSIS H. pylori gastritis (Dr. Juanda Chance) TCS 2003: abd pain/diarhea-WNLS TCS 2008: WNLs Hypothyroidism GERD  Past Surgical History: Last updated: 06/07/2009 Hysterectomy 2003 LAPAROSCOPY/ADHESIOLYSIS: dense adhesion between rectum and sigmoid, & pelvis SALPINGOOPHORECTOMY Bilateral R FOOT SX   Social History: Last updated: 11/09/2009 Occupation: fulltime [employer] divorced one grown child Patient has never smoked.  Alcohol Use - wine 2x/year Drug use-no Regular exercise-yes  Vital Signs:  Patient profile:   60 year old female Height:      59 inches Weight:      145 pounds BMI:     29.39 Temp:     97.8 degrees F oral Pulse rate:   88 / minute BP sitting:   120 / 82  (right arm) Cuff size:   regular  Vitals Entered By: Cloria Spring LPN (February 11, 2010 4:10 PM)  Impression &  Recommendations:  Problem # 1:  ABDOMINAL PAIN, LOWER (ICD-789.09) Assessment Unchanged  Pt having pain 2o to IBS-c +/- adhesions. Seen and evaluated by dr. Cherly Hensen. No surgical intervention warranted unless pt having NV, and persistent unremitting pain. Avoid constipation. Eat the right things. Encourage exercise and weight loss efforts.  Orders: Est. Patient Level II (13086)  Problem # 2:  GERD (ICD-530.81) Assessment: Improved  Continue PPI. OPV in 4 mos.  CC: PCP  Orders: Est. Patient Level II (57846)  Appended Document: ABD PAIN, GERD 4 MONTH OPV IS IN THE COMPUTER

## 2010-08-01 NOTE — Letter (Signed)
Summary: Recall Office Visit  [facility]  [address]   [place], [place] [address]   Phone: [phone]  Fax: [phone]      December 13, 2009   Kim Grimes [address] [place], [place]  [address] 10/05/1950   Dear Ms. Kassin,   According to our records, it is time for you to schedule a follow-up office visit with [place].   At your convenience, please call [phone] to schedule an office visit. If you have any questions, concerns, or feel that this letter is in error, we would appreciate your call.   Sincerely,    Diana Eves  [facility] Ph: [phone]   Fax: [phone]

## 2010-08-01 NOTE — Letter (Signed)
Summary: Recall Office Visit  [facility]  [address]   [place], [place] [address]   Phone: [phone]  Fax: [phone]      May 15, 2010   RUHANI UMLAND [address] [place], [place]  [address] 1950-10-12   Dear Ms. Ornstein,   According to our records, it is time for you to schedule a follow-up office visit with [place].   At your convenience, please call [phone] to schedule an office visit. If you have any questions, concerns, or feel that this letter is in error, we would appreciate your call.   Sincerely,    Diana Eves  [facility] Ph: [phone]   Fax: [phone]

## 2010-08-01 NOTE — Progress Notes (Signed)
  Phone Note Call from Patient   Caller: Patient Summary of Call: T/C from pt, said [facility] did not have her Rx for Omeprazole that Dr. Darrick Penna was going to send over. I checked with [facility] @ [facility]  and he said they did not have it, so he took the verbal order for #60 two times a day and 5 refills. Initial call taken by: Cloria Spring LPN,  June 07, 2009 2:41 PM

## 2010-08-01 NOTE — Assessment & Plan Note (Signed)
Summary: new patient- room 2   Vital Signs:  Patient profile:   60 year old female Height:      59 inches Weight:      145 pounds BMI:     29.39 O2 Sat:      100 % on Room air Pulse rate:   78 / minute Resp:     16 per minute BP sitting:   120 / 80  (left arm)  Vitals Entered By: Adella Hare LPN (Nov 09, 2009 9:05 AM) CC: new patient Is Patient Diabetic? No Pain Assessment Patient in pain? no      Comments patient has recently been suffering from involuntary muscle movement, has occasional fits where her body jerks, onset on week ago   Primary Provider:  Esperanza Sheets PA  CC:  new patient.  History of Present Illness: New pt here to establish care with new PCP. Onset of involuntary muscle mvmt x 1 week.  No hx of previously.  No FH of neurological d/o. Started after an argument with her grandson. Began having the involuntary mvmts, then passed out x 1 min or less.  When she got up she felt funny, but no loss of bowel or bladder control.  Mvmts have persisted since then.  No more syncopal episodes.  Did see her previous physcian, Dr. Loleta Chance.  Per patient had normal labs done.  Had Head CT yesterday at [facility] imaging.  Has not received the results yet.  Dr Loleta Chance referred her to a neurologist, and she has an appt on Monday 11/12/09.  Thought processes are clear.  No difficulty with voluntary mvmts, including picking things up, driving, folding laundry etc.  Some days are worse than others.  Sleep is good, no awakening.  No numbness or tingling.  No weakness.  No visiual changes, except the other night 8's looked like 6's.  Reading magazine today & letters and numbers were normal.  Cramping in bilat calves x about 2 weeks.  Not full cramps, but wakes up feeling like going to have cramps in them.  Pt states Dr Loleta Chance wanted to give her a prescription for valium until she saw the neurologist.  She didnt take it because she doesnt like to take these sorts of medicines, and wants to find  out what is wrong with her.      Current Medications (verified): 1)  Omeprazole 20 Mg Cpdr (Omeprazole) .... One Cap By Mouth Once Daily 2)  Estradiol 1 Mg Tabs (Estradiol) .... One Tab By Mouth Once Daily 3)  Levothroid 75 Mcg Tabs (Levothyroxine Sodium) .... One Tab By Mouth Once Daily 4)  Norel Sr 8-50-40-325 Mg Xr12h-Tab (Chlorphen-Phenyltolox-Pe-Apap) .... One Tab By Mouth Two Times A Day For Congestion  Allergies (verified): 1)  ! Steroids 2)  ! Valium  Past History:  Past medical, surgical, family and social histories (including risk factors) reviewed for relevance to current acute and chronic problems.  Past Medical History: IBS 2003 Anxiety Disorder Endometriosis/CHRONIC PELVIC PAIN OSTEOPOROSIS H. pylori gastritis (Dr. Juanda Chance) TCS 2003: abd pain/diarhea-WNLS TCS 2008: WNLs Hypothyroidism GERD  Past Surgical History: Reviewed history from 06/07/2009 and no changes required. Hysterectomy 2003 LAPAROSCOPY/ADHESIOLYSIS: dense adhesion between rectum and sigmoid, & pelvis SALPINGOOPHORECTOMY Bilateral R FOOT SX   Family History: Reviewed history from 06/07/2009 and no changes required. father deceased-prostate and colon cancer mother deceased- pancreatic cancer two sisters living- htn, thyroid dz two brothers living- DM, htn, heart dz  Social History: Reviewed history from 06/07/2009 and no changes required.  Occupation: fulltime [employer] divorced one grown child Patient has never smoked.  Alcohol Use - wine 2x/year Drug use-no Regular exercise-yes Drug Use:  no Does Patient Exercise:  yes  Review of Systems General:  Denies chills, fever, sleep disorder, and weakness. Eyes:  Denies blurring, double vision, vision loss-1 eye, and vision loss-both eyes. ENT:  Complains of nasal congestion; denies difficulty swallowing, earache, sinus pressure, and sore throat. CV:  Denies chest pain or discomfort and palpitations. Resp:  Denies cough and  shortness of breath. GI:  Denies abdominal pain and change in bowel habits. GU:  Denies incontinence. MS:  Complains of cramps; denies joint pain, joint swelling, loss of strength, low back pain, mid back pain, muscle weakness, and stiffness. Neuro:  Complains of visual disturbances; denies brief paralysis, headaches, inability to speak, memory loss, numbness, tingling, and weakness. Psych:  Complains of anxiety.  Physical Exam  General:  Well-developed,well-nourished,in no acute distress; alert,appropriate and cooperative throughout examination Head:  Normocephalic and atraumatic without obvious abnormalities. No apparent alopecia or balding. Eyes:  pupils equal, pupils round, pupils reactive to light, pupils react to accomodation, no injection, no optic disk abnormalities, no retinal abnormalitiies, and no nystagmus.   Ears:  External ear exam shows no significant lesions or deformities.  Otoscopic examination reveals clear canals, tympanic membranes are intact bilaterally without bulging, retraction, inflammation or discharge. Hearing is grossly normal bilaterally. Nose:  External nasal examination shows no deformity or inflammation. Nasal mucosa are pink and moist without lesions or exudates. Mouth:  Oral mucosa and oropharynx without lesions or exudates.   Neck:  No deformities, masses, or tenderness noted. Lungs:  Normal respiratory effort, chest expands symmetrically. Lungs are clear to auscultation, no crackles or wheezes. Heart:  Normal rate and regular rhythm. S1 and S2 normal without gallop, murmur, click, rub or other extra sounds. Msk:  normal ROM, no joint tenderness, and no joint swelling.   Pulses:  R radial normal, R posterior tibial normal, R dorsalis pedis normal, L radial normal, L posterior tibial normal, and L dorsalis pedis normal.   Extremities:  No clubbing, cyanosis, edema, or deformity noted with normal full range of motion of all joints.  Involuntary mvmt of  extremities noted during the visit, mvmts are bilat, and more pronounced in UE. Neurologic:  alert & oriented X3, cranial nerves II-XII intact, strength normal in all extremities, sensation intact to light touch, and gait normal.   DTR  +3/4 bilat patellar, biceps and achilles. Skin:  Intact without suspicious lesions or rashes Cervical Nodes:  No lymphadenopathy noted Psych:  Cognition and judgment appear intact. Alert and cooperative with normal attention span and concentration. No apparent delusions, illusions, hallucinations   Impression & Recommendations:  Problem # 1:  ABNORMAL INVOLUNTARY MOVEMENTS (ICD-781.0) Assessment New Discussed with pt that she apparently has some sort of neurological disorder and that she will have to wait until she sees the Neurologist on Monday for further evaluation & recommendations.  That beyond what Dr Loleta Chance has already ordered there is nothing more that I could order or test for before Monday. I also discussed with pt that the Valium is a good idea.  May help with some of her anxiety about this, but also is a muscle relaxer.  Pt agrees to try it only if it "is a very low dose."  Also advised pt that she is not to be driving until she has clearance from the neurologist.  Not only due to the severity of her  spasms/involuntary mvmts, but also because she had a syncopal episode.  Complete Medication List: 1)  Omeprazole 20 Mg Cpdr (Omeprazole) .... One cap by mouth once daily 2)  Estradiol 1 Mg Tabs (Estradiol) .... One tab by mouth once daily 3)  Levothroid 75 Mcg Tabs (Levothyroxine sodium) .... One tab by mouth once daily 4)  Norel Sr 8-50-40-325 Mg Xr12h-tab (Chlorphen-phenyltolox-pe-apap) .... One tab by mouth two times a day for congestion 5)  Diazepam 2 Mg Tabs (Diazepam) .... Take 1-2 tabs every 8 hrs as needed for muscle spasms  Patient Instructions: 1)  Please schedule a follow-up appointment in 1 month. 2)  We have requested your CT results.  If  they send them to [place] today we will notify you of the results. 3)  I have prescribed the lowest dose of Valium available to try to help with some of the spasms. 4)  YOU SHOULD NOT DRIVE AT THIS TIME.  YOU ARE A POTENTIAL DANGER TO YOURSELF AND OTHERS WHILE DRIVING.  YOU NEED CLEARANCE FROM THE NEUROLOGIST TO DRIVE. Prescriptions: DIAZEPAM 2 MG TABS (DIAZEPAM) take 1-2 tabs every 8 hrs as needed for muscle spasms  #30 x 0   Entered and Authorized by:   Esperanza Sheets PA   Signed by:   Esperanza Sheets PA on 11/09/2009   Method used:   Printed then faxed to ...       [facility]  [address] [facility] (retail)       [address]       [place], [place]  [address]       Ph: [phone]       Fax: [phone]   RxID:   [id number]

## 2010-09-03 ENCOUNTER — Other Ambulatory Visit: Payer: Self-pay | Admitting: Obstetrics and Gynecology

## 2010-09-03 DIAGNOSIS — Z1231 Encounter for screening mammogram for malignant neoplasm of breast: Secondary | ICD-10-CM

## 2010-09-16 ENCOUNTER — Ambulatory Visit: Payer: Self-pay

## 2010-09-19 ENCOUNTER — Ambulatory Visit
Admission: RE | Admit: 2010-09-19 | Discharge: 2010-09-19 | Disposition: A | Discharge status: Home / Self Care | Payer: BC Managed Care – PPO | Source: Ambulatory Visit | Attending: Obstetrics and Gynecology | Admitting: Obstetrics and Gynecology

## 2010-09-19 DIAGNOSIS — Z1231 Encounter for screening mammogram for malignant neoplasm of breast: Secondary | ICD-10-CM

## 2010-10-21 ENCOUNTER — Encounter: Payer: Self-pay | Admitting: Family Medicine

## 2010-10-21 ENCOUNTER — Encounter: Payer: Self-pay | Admitting: Physician Assistant

## 2010-10-22 ENCOUNTER — Encounter: Payer: Self-pay | Admitting: Family Medicine

## 2010-10-22 ENCOUNTER — Encounter: Payer: Self-pay | Admitting: Physician Assistant

## 2010-10-22 ENCOUNTER — Ambulatory Visit (INDEPENDENT_AMBULATORY_CARE_PROVIDER_SITE_OTHER): Payer: BC Managed Care – PPO | Admitting: Family Medicine

## 2010-10-22 VITALS — BP 104/80 | HR 68 | Resp 16 | Ht 59.5 in | Wt 142.1 lb

## 2010-10-22 DIAGNOSIS — E785 Hyperlipidemia, unspecified: Secondary | ICD-10-CM

## 2010-10-22 DIAGNOSIS — M549 Dorsalgia, unspecified: Secondary | ICD-10-CM

## 2010-10-22 DIAGNOSIS — F411 Generalized anxiety disorder: Secondary | ICD-10-CM

## 2010-10-22 DIAGNOSIS — M949 Disorder of cartilage, unspecified: Secondary | ICD-10-CM

## 2010-10-22 DIAGNOSIS — K219 Gastro-esophageal reflux disease without esophagitis: Secondary | ICD-10-CM

## 2010-10-22 DIAGNOSIS — M899 Disorder of bone, unspecified: Secondary | ICD-10-CM

## 2010-10-22 DIAGNOSIS — R5381 Other malaise: Secondary | ICD-10-CM

## 2010-10-22 DIAGNOSIS — E039 Hypothyroidism, unspecified: Secondary | ICD-10-CM

## 2010-10-22 DIAGNOSIS — R5383 Other fatigue: Secondary | ICD-10-CM

## 2010-10-22 LAB — BASIC METABOLIC PANEL
BUN: 14 mg/dL (ref 6–23)
CO2: 27 mEq/L (ref 19–32)
Calcium: 9.3 mg/dL (ref 8.4–10.5)
Chloride: 106 mEq/L (ref 96–112)
Creat: 0.83 mg/dL (ref 0.40–1.20)
Glucose, Bld: 84 mg/dL (ref 70–99)
Potassium: 4.4 mEq/L (ref 3.5–5.3)
Sodium: 143 mEq/L (ref 135–145)

## 2010-10-22 LAB — CBC WITH DIFFERENTIAL/PLATELET
Basophils Absolute: 0 10*3/uL (ref 0.0–0.1)
Basophils Relative: 1 % (ref 0–1)
Eosinophils Absolute: 0.1 10*3/uL (ref 0.0–0.7)
Eosinophils Relative: 2 % (ref 0–5)
HCT: 42.1 % (ref 36.0–46.0)
Hemoglobin: 14.2 g/dL (ref 12.0–15.0)
Lymphocytes Relative: 36 % (ref 12–46)
Lymphs Abs: 1.9 10*3/uL (ref 0.7–4.0)
MCH: 29.3 pg (ref 26.0–34.0)
MCHC: 33.7 g/dL (ref 30.0–36.0)
MCV: 87 fL (ref 78.0–100.0)
Monocytes Absolute: 0.4 10*3/uL (ref 0.1–1.0)
Monocytes Relative: 8 % (ref 3–12)
Neutro Abs: 2.8 10*3/uL (ref 1.7–7.7)
Neutrophils Relative %: 53 % (ref 43–77)
Platelets: 220 10*3/uL (ref 150–400)
RBC: 4.84 MIL/uL (ref 3.87–5.11)
RDW: 13 % (ref 11.5–15.5)
WBC: 5.3 10*3/uL (ref 4.0–10.5)

## 2010-10-22 LAB — LIPID PANEL
Cholesterol: 205 mg/dL — ABNORMAL HIGH (ref 0–200)
HDL: 55 mg/dL (ref >39)
LDL Cholesterol: 130 mg/dL — ABNORMAL HIGH (ref 0–99)
Total CHOL/HDL Ratio: 3.7 Ratio
Triglycerides: 100 mg/dL (ref <150)
VLDL: 20 mg/dL (ref 0–40)

## 2010-10-22 MED ORDER — KETOROLAC TROMETHAMINE 30 MG/ML IJ SOLN
60.0000 mg | Freq: Once | INTRAMUSCULAR | Status: AC
  Administered 2010-10-22: 60 mg via INTRAMUSCULAR

## 2010-10-22 MED ORDER — IBUPROFEN 800 MG PO TABS: 800.0000 mg | ORAL_TABLET | Freq: Three times a day (TID) | ORAL | Status: AC | PRN

## 2010-10-22 MED ORDER — DIAZEPAM 2 MG PO TABS: 2.0000 mg | ORAL_TABLET | Freq: Three times a day (TID) | ORAL | Status: DC | PRN

## 2010-10-22 MED ORDER — METHYLPREDNISOLONE ACETATE 80 MG/ML IJ SUSP
80.0000 mg | Freq: Once | INTRAMUSCULAR | Status: AC
  Administered 2010-10-22: 80 mg via INTRAMUSCULAR

## 2010-10-22 NOTE — Progress Notes (Signed)
  Subjective:    Patient ID: Kim Grimes, female    DOB: 03/08/1951, 60 y.o.   MRN: [id number]  HPI Left leg pain from buttock to post left thigh, sometimes down to the toes, for several years, worse in the past 2 months.Has had injection in the past which helped by orthopedics Pain is 10 plus keeps her up, she has left arm numbness at times  Big toe feels as if it has been hit with a hammer at times Reports weakness in the left leg which has actually given out at times , No falling, reports urinary incontinence, she is unaware of the need to go at some times, this in the past 2 months Sleep deprived, pain keeps her awake She has no other complaints at this time  Review of Systems Denies recent fever or chills. Denies sinus pressure, nasal congestion, ear pain or sore throat. Denies chest congestion, productive cough or wheezing. Denies chest pains, palpitations, paroxysmal nocturnal dyspnea, orthopnea and leg swelling Denies abdominal pain, nausea, vomiting,diarrhea or constipation.  Denies rectal bleeding or change in bowel movement. Denies dysuria, frequency, hesitancy or incontinence. Denies headaches, seizure, numbness, or tingling. Denies uncontrolled  Depression or  anxiety , her sleep is disturbed by pain Denies skin break down or rash.        Objective:   Physical Exam Patient alert and oriented and in no Cardiopulmonary distress.Pt in pain  HEENT: No facial asymmetry, EOMI, no sinus tenderness, TM's clear, Oropharynx pink and moist.  Neck supple no adenopathy.  Chest: Clear to auscultation bilaterally.  CVS: S1, S2 no murmurs, no S3.  ABD: Soft non tender. Bowel sounds normal.  Ext: No edema  WU:[id number] ROM thoracic and  lumbar  Spine,adequate in  shoulders, hips and knees.  Skin: Intact, no ulcerations or rash noted.  Psych: Good eye contact, normal affect. Memory intact not anxious or depressed appearing.  CNS: CN 2-12 intact,reduced  power, tone and  sensation in left lower extremity       Assessment & Plan:

## 2010-10-22 NOTE — Patient Instructions (Signed)
F/u in 4 months.  You will get 2 injections today for your back pain, toradol and depomedrol.  You will be referred for an MRI of your mid and low back to evaluate the pain  cBC , fasting chem 7, lipid, Vit D  Today.  Med is sent to your pharmacy. Start ibuprofen tomorrow

## 2010-10-23 LAB — VITAMIN D 25 HYDROXY (VIT D DEFICIENCY, FRACTURES): Vit D, 25-Hydroxy: 19 ng/mL — ABNORMAL LOW (ref 30–89)

## 2010-10-28 ENCOUNTER — Encounter: Payer: Self-pay | Admitting: Family Medicine

## 2010-10-28 ENCOUNTER — Telehealth: Payer: Self-pay | Admitting: Physician Assistant

## 2010-10-28 DIAGNOSIS — M549 Dorsalgia, unspecified: Secondary | ICD-10-CM | POA: Insufficient documentation

## 2010-10-28 NOTE — Assessment & Plan Note (Signed)
Deteriorated x 2 months, injections and tests

## 2010-10-28 NOTE — Assessment & Plan Note (Signed)
Controlled on current medication, no change

## 2010-10-28 NOTE — Assessment & Plan Note (Signed)
Followed by GI was seen in March

## 2010-10-28 NOTE — Assessment & Plan Note (Signed)
Will need tsh checked if does not follow with endocrine, currently on medication

## 2010-10-28 NOTE — Telephone Encounter (Signed)
Orders have been entered for MRI without contrast on thoracic and lumbar spine pls set up as soon as possible and let her know sorry about the delay

## 2010-10-29 NOTE — Telephone Encounter (Signed)
Patient aware, does she thyroid specialist, and states she received two injections in the office

## 2010-10-31 ENCOUNTER — Ambulatory Visit (HOSPITAL_COMMUNITY)
Admission: RE | Admit: 2010-10-31 | Discharge: 2010-10-31 | Disposition: A | Discharge status: Home / Self Care | Payer: BC Managed Care – PPO | Source: Ambulatory Visit | Attending: Family Medicine | Admitting: Family Medicine

## 2010-10-31 DIAGNOSIS — M51379 Other intervertebral disc degeneration, lumbosacral region without mention of lumbar back pain or lower extremity pain: Secondary | ICD-10-CM | POA: Insufficient documentation

## 2010-10-31 DIAGNOSIS — M549 Dorsalgia, unspecified: Secondary | ICD-10-CM

## 2010-10-31 DIAGNOSIS — M5137 Other intervertebral disc degeneration, lumbosacral region: Secondary | ICD-10-CM | POA: Insufficient documentation

## 2010-10-31 DIAGNOSIS — M545 Low back pain, unspecified: Secondary | ICD-10-CM | POA: Insufficient documentation

## 2010-11-02 ENCOUNTER — Telehealth: Payer: Self-pay | Admitting: Family Medicine

## 2010-11-02 DIAGNOSIS — M549 Dorsalgia, unspecified: Secondary | ICD-10-CM

## 2010-11-02 NOTE — Progress Notes (Signed)
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 I recommend pain management referral , if she agrees pls refer I am entering the referral , if she refuses pls document

## 2010-11-02 NOTE — Telephone Encounter (Signed)
Response to abn MRI

## 2010-11-04 ENCOUNTER — Encounter: Payer: Self-pay | Admitting: Family Medicine

## 2010-11-15 NOTE — Op Note (Signed)
Kim Grimes, Kim Grimes           ACCOUNT NO.:  [id number]   MEDICAL RECORD NO.:  [id number]          PATIENT TYPE:  AMB   LOCATION:  DAY                           FACILITY:  [facility]   PHYSICIAN:  Kassie Mends, M.D.      DATE OF BIRTH:  12/27/1950   DATE OF PROCEDURE:  09/14/2006  DATE OF DISCHARGE:                                PROCEDURE NOTE   REFERRING PHYSICIAN:  Maxie Better, M.D.   PROCEDURE:  Colonoscopy.   ENDOSCOPIST:  Kassie Mends, M.D.   INDICATION FOR EXAM:  Kim Grimes is a 61 year old female who had a  first-degree relative with colon cancer diagnosed at age less than 59.  She presents with for colon cancer screening.  Her last colonoscopy was  in 2002.   FINDINGS:  1. Normal colon without evidence of polyps, masses, inflammatory      changes, diverticula or AVMs.  2. Normal retroflexed view of the rectum.   RECOMMENDATIONS:  1. High-fiber diet.  Kim Grimes was given a handout on high-fiber      diet.  High-fiber diet is associated with decreased risk of colon      cancer.  2. Follow up with Dr. Maxie Better.  3. Screening colonoscopy in 5 years.   MEDICATIONS:  1. Demerol 75 mg IV.  2. Versed 6 mg IV.  3. Phenergan 12.5 mg IV given prior to procedure secondary to nausea      induced by bowel prep.   PROCEDURAL TECHNIQUE:  Physical exam was performed and informed consent  was obtained from the patient after explaining the benefits, risks and  alternatives to the procedure.  The patient was connected to a monitor  and placed in the left lateral position.  Continuous oxygen was provided  by nasal cannula and IV medicine administered through an indwelling  cannula.  After administration of sedation and rectal exam, the  patient's rectum was intubated  and scope was advanced under direct visualization to the cecum.  The  scope was subsequently removed slowly by carefully examining the color,  texture, anatomy and integrity of the mucosa on the  way out.  The  patient was recovered in the endoscopy suite and discharged to home in  satisfactory condition.      Kassie Mends, M.D.  Electronically Signed     SM/MEDQ  D:  09/14/2006  T:  09/15/2006  Job:  [id number]   cc:   Maxie Better, M.D.  Fax: [phone]

## 2010-11-15 NOTE — Discharge Summary (Signed)
   Kim Grimes, Kim Grimes                       ACCOUNT NO.:  [id number]   MEDICAL RECORD NO.:  [id number]                  PATIENT TYPE:   LOCATION:                                       FACILITY:   PHYSICIAN:  Dirk Dress. Katrinka Blazing, M.D.                DATE OF BIRTH:   DATE OF ADMISSION:  03/29/2002  DATE OF DISCHARGE:  03/30/2002                                 DISCHARGE SUMMARY   POSTOPERATIVE DIAGNOSES:  1. 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  2. Endometriosis.  3. Irritable bowel syndrome.   SPECIAL PROCEDURE:  Diagnostic laparoscopy with adhesiolysis.   DISPOSITION:  The patient discharged home in stable satisfactory condition.   DISCHARGE MEDICATIONS:  1. Tylox one every 4 hours as needed for pain.  2. Levsin 0.125 mg q.i.d.   SUMMARY:  The patient is a 60 year old female with a history of pain in her  left lower abdomen for quite some time.  She had been treated with multiple  medications without improvement.  CT of the abdomen, small-bowel follow-  through, colonoscopy, upper endoscopy were normal.  The pain was severe and  occurred with all movement.  It was mostly in the left lower quadrant and  extended to her left flank.  There was no evidence of diverticulosis.  The  patient has a history of endometriosis but this had been quiescent for quite  some time.  She was scheduled for diagnostic laparoscopy.  On examination  she had no abnormal findings except for moderate tenderness in the left  lower quadrant in the suprapubic area extending into the left flank.  The  patient underwent diagnostic laparoscopy on March 29, 2002.  She was  found to have dense adhesions of the distal sigmoid colon to the left  lateral sidewall and pelvis.  Adhesiolysis was done and this totally freed  up.  There were no adhesions in the deep pelvis.  There were no small bowel  adhesions noted.  The patient did well in the postoperative period.  She was  stable and was discharged  home the first postoperative day in satisfactory  condition.                                               Dirk Dress. Katrinka Blazing, M.D.    LCS/MEDQ  D:  06/19/2002  T:  06/20/2002  Job:  [id number]

## 2010-11-15 NOTE — Op Note (Signed)
   NAME:  Kim Grimes, Kim Grimes                     ACCOUNT NO.:  [id number]   MEDICAL RECORD NO.:  [id number]                   PATIENT TYPE:  AMB   LOCATION:  DAY                                  FACILITY:  [facility]   PHYSICIAN:  Jerolyn Shin C. Katrinka Blazing, M.D.                DATE OF BIRTH:  01-25-1951   DATE OF PROCEDURE:  DATE OF DISCHARGE:                                 OPERATIVE REPORT   PREOPERATIVE DIAGNOSIS:  Chronic pelvic pain.   POSTOPERATIVE DIAGNOSIS:  Chronic pelvic pain and pelvic and sigmoid colon  effusions.   PROCEDURE:  Diagnostic laparoscopy with adhesiolysis.   SURGEON:  Dirk Dress. Katrinka Blazing, M.D.   DESCRIPTION OF PROCEDURE:  Under general anesthesia the patient's abdomen  was prepped and draped in a sterile field.  A supraumbilical incision was  made and Veress needle was inserted.  Abdomen was then insufflated with 2.5  L of CO2.  Using a Vis-A-Port guide a 10 mm port was placed uneventfully.  Laparoscope was placed.  Under videoscopic guidance a 5-mm port was placed  in the suprapubic area and another in the left lateral abdomen.   Inspection of the small-bowel revealed no evidence of small-bowel adhesions  in the pelvis.  The pelvis was free.  There were dense adhesions between the  rectum and sigmoid and the right lateral abdomen in the upper pelvis.  These  adhesions were taken down using endoshears to totally free up the sigmoid  extending all the way down to the rectum.  No other adhesions were noted.   Inspection of the upper abdomen was unremarkable.  The patient tolerated the  procedure well.  It was felt that Interceed placed in this area would not be  of any significance, it would just be an added cost so it was not placed.  The patient tolerated the procedure well.   Copious irrigation was carried out.  There was no significant bleeding.  CO2  was allowed to escape from the abdomen and then the ports were removed.  The  large incision was closed with #0 Dexon on  the fascia.  The skin of each  incision was closed with staples.  The patient tolerated the procedure well.  Foley catheter was removed.  Sterile dressings were placed.  She was  awakened from anesthesia, transferred to a bed, and taken to the  postanesthetic care unit.                                               Dirk Dress. Katrinka Blazing, M.D.    LCS/MEDQ  D:  03/29/2002  T:  03/29/2002  Job:  [id number]

## 2010-11-15 NOTE — H&P (Signed)
NAME:  Kim Grimes, Kim Grimes                     ACCOUNT NO.:  [id number]   MEDICAL RECORD NO.:  [id number]                   PATIENT TYPE:  AMB   LOCATION:  DAY                                  FACILITY:  [facility]   PHYSICIAN:  Jerolyn Shin C. Katrinka Blazing, M.D.                DATE OF BIRTH:  Mar 23, 1951   DATE OF ADMISSION:  DATE OF DISCHARGE:                                HISTORY & PHYSICAL   HISTORY OF PRESENT ILLNESS:  The patient is a 60 year old  female with a  history of pain in the left lower quadrant.  The pain has always been on the  left side.  She has been treated with multiple medications without them  being effective.  CT of the abdomen, small follow through, colonoscopy and  upper endoscopy were normal.  The pain is quite severe.  She has pain with  movement.  The pain is mostly in her left lower quadrant and expands to her  left flank.  She did not have diverticulosis on colonoscopy.  She does have  a history of endometriosis.  She has had laparoscopy in the past many years  ago.  The patient is scheduled for diagnostic laparoscopy and possible  adhesiolysis.   PAST SURGICAL HISTORY:  1. Total abdominal hysterectomy.  2. Diagnostic laparoscopy.   OTHER MEDICAL CONDITIONS:  Osteoporosis.   MEDICATIONS:  1. Fosamax 10 mg q.d.  2. Estrace 1 q.d.  3. Levbid 0.375 mg b.i.d.   FAMILY HISTORY:  Positive for colon cancer, prostate cancer, pancreatic  cancer and diabetes.   ALLERGIES:  She has no known drug allergies.   SOCIAL HISTORY:  She is divorced.  Employed.  She does not drink, smoke or  use drugs.   PHYSICAL EXAMINATION:   VITAL SIGNS:  On exam blood pressure 126/82, pulse 76, respirations 16 and  weight 129 pounds.   HEENT:  Unremarkable.   NECK:  Supple without JVD or bruit.   CHEST:  Clear to auscultation.  No rales, rubs, rhonchi or wheezes.   HEART:  Regular rate and rhythm without murmur, gallop or rub.   ABDOMEN:  Soft with moderate tenderness in left lower  quadrant and  suprapubic area and extending into the left flank.  There is no CVA  tenderness.   RECTAL:  There is tenderness on rectal, but no palpable masses.   EXTREMITIES:  No cyanosis, clubbing or edema.   NEUROLOGIC EXAMINATION:  No focal motor, sensory or cerebellar deficit.   IMPRESSION:  1. Chronic severe pelvic pain of uncertain etiology probably due to adhesive     disease.  2. Endometriosis.  3. Irritable bowel syndrome.   PLAN:  Diagnostic laparoscopy with adhesiolysis if indicated.  Dirk Dress. Katrinka Blazing, M.D.    LCS/MEDQ  D:  03/28/2002  T:  03/29/2002  Job:  [id number]

## 2010-11-15 NOTE — Procedures (Signed)
[place]  Patient:    Kim Grimes, Kim Grimes Visit Number: [id number] MRN: [id number]          Service Type: END Location: ENDO Attending Physician:  Mervin Hack Dictated by:   Hedwig Morton. Juanda Chance, M.D. [facility] Proc. Date: 08/11/01 Admit Date:  08/11/2001   CC:         Barbette Hair. Vaughan Basta., M.D.   Procedure Report  PROCEDURE: Colonoscopy.  ENDOSCOPIST: Hedwig Morton. Juanda Chance, M.D.  INDICATIONS FOR PROCEDURE: This 60 year old female has complained of severe left lower quadrant abdominal pain radiating to her back.  She had a normal CT scan of the abdomen and pelvis three months ago, but the pain has been getting worse.  She has positive family history of colon cancer in her father, who died in his 41s.  Mother died of pancreatic CA.  Her stool was slightly Hemoccult positive.  Her urinalysis was normal, but she did have CVA tenderness on the left side.  She has complained of nocturnal diarrhea.  The patient says that she had a colonoscopy elsewhere about five to six years ago and was found to have polyps.  She is now undergoing colonoscopy to evaluate her left lower quadrant abdominal pain.  ENDOSCOPE: Fujinon single-channel video endoscope.  SEDATION: Versed 12 mg IV, Fentanyl 100 mcg IV.  FINDINGS: The Fujinon single-channel video endoscope was passed under direct vision through the rectum to the sigmoid colon.  The patient was monitored by pulse oximetry and oxygen saturations were 95-98%.  She had a lot of discomfort as the endoscope traversed through the sigmoid colon.  There were no hemorrhoids.  The sigmoid colon was somewhat tortuous but mucosa was normal.  There was no obstruction, no diverticula, no evidence of colitis.  As the endoscope withdrew the sigmoid and descending colon the procedure was very easy, with the scope advancing through to the transverse colon and hepatic flexure to the ascending colon, and easier to the cecum.   Appendiceal opening and cecal pouch were normal.  The colonoscope was then retracted and again the left colon was examined thoroughly, but appeared normal.  There was no evidence of extrinsic compression.  IMPRESSION: Essentially normal colonoscopy to the cecum.  Nothing to account for left lower quadrant abdominal pain.  PLAN:  1. We will proceed with CT scan of the abdomen and pelvis, with attention to    the left lower quadrant.  2. May need gynecologic evaluation to further work up her pain. Dictated by:   Hedwig Morton. Juanda Chance, M.D. [facility] Attending Physician:  Mervin Hack DD:  08/11/01 TD:  08/11/01 Job: [id number]

## 2010-11-18 ENCOUNTER — Other Ambulatory Visit (HOSPITAL_COMMUNITY): Payer: Self-pay | Admitting: Physical Medicine and Rehabilitation

## 2010-11-18 DIAGNOSIS — R200 Anesthesia of skin: Secondary | ICD-10-CM

## 2010-11-19 ENCOUNTER — Ambulatory Visit (HOSPITAL_COMMUNITY)
Admission: RE | Admit: 2010-11-19 | Discharge: 2010-11-19 | Disposition: A | Discharge status: Home / Self Care | Payer: BC Managed Care – PPO | Source: Ambulatory Visit | Attending: Physical Medicine and Rehabilitation | Admitting: Physical Medicine and Rehabilitation

## 2010-11-19 DIAGNOSIS — M5020 Other cervical disc displacement, unspecified cervical region: Secondary | ICD-10-CM | POA: Insufficient documentation

## 2010-11-19 DIAGNOSIS — R209 Unspecified disturbances of skin sensation: Secondary | ICD-10-CM | POA: Insufficient documentation

## 2010-11-19 DIAGNOSIS — R200 Anesthesia of skin: Secondary | ICD-10-CM

## 2010-11-19 DIAGNOSIS — M542 Cervicalgia: Secondary | ICD-10-CM | POA: Insufficient documentation

## 2010-11-19 DIAGNOSIS — M25519 Pain in unspecified shoulder: Secondary | ICD-10-CM | POA: Insufficient documentation

## 2010-12-19 ENCOUNTER — Encounter: Payer: BC Managed Care – PPO | Attending: Physical Medicine & Rehabilitation

## 2010-12-19 ENCOUNTER — Ambulatory Visit (HOSPITAL_BASED_OUTPATIENT_CLINIC_OR_DEPARTMENT_OTHER): Payer: BC Managed Care – PPO | Admitting: Physical Medicine & Rehabilitation

## 2010-12-19 DIAGNOSIS — R209 Unspecified disturbances of skin sensation: Secondary | ICD-10-CM

## 2010-12-19 DIAGNOSIS — G5610 Other lesions of median nerve, unspecified upper limb: Secondary | ICD-10-CM

## 2010-12-19 DIAGNOSIS — M79609 Pain in unspecified limb: Secondary | ICD-10-CM

## 2011-01-06 ENCOUNTER — Ambulatory Visit: Payer: BC Managed Care – PPO | Admitting: Gastroenterology

## 2011-01-07 ENCOUNTER — Encounter: Payer: Self-pay | Admitting: Gastroenterology

## 2011-01-07 ENCOUNTER — Ambulatory Visit (INDEPENDENT_AMBULATORY_CARE_PROVIDER_SITE_OTHER): Payer: BC Managed Care – PPO | Admitting: Gastroenterology

## 2011-01-07 VITALS — BP 121/82 | HR 81 | Temp 97.1°F | Ht <= 58 in | Wt 143.6 lb

## 2011-01-07 DIAGNOSIS — K589 Irritable bowel syndrome without diarrhea: Secondary | ICD-10-CM

## 2011-01-07 DIAGNOSIS — K219 Gastro-esophageal reflux disease without esophagitis: Secondary | ICD-10-CM

## 2011-01-07 MED ORDER — LANSOPRAZOLE 30 MG PO CPDR: 30.0000 mg | DELAYED_RELEASE_CAPSULE | Freq: Two times a day (BID) | ORAL | Status: DC

## 2011-01-07 NOTE — Assessment & Plan Note (Signed)
Sx fairly well controlled.  Pt declines antispasmodics because she says it makes her Sx worse. Continue diet modification. HO given in IBS and etiology of pain explained. Follow up in 6 mos.

## 2011-01-07 NOTE — Progress Notes (Signed)
Cc to PCP 

## 2011-01-07 NOTE — Assessment & Plan Note (Addendum)
Sx fairly well controlled. Pt not strictly adhering to diet recommendations.  Continue Prevacid BID. Refill X 1 year. Continue low fat diet and weight loss efforts. OPV in 6 mos.

## 2011-01-07 NOTE — Patient Instructions (Signed)
Continue weight loss efforts. AVOID CAFFEINE and chocolate. It will make you have more reflux Sx. Follow up in 6 mos.  Irritable Bowel Syndrome (Spastic Colon) Irritable Bowel Syndrome (IBS) is caused by a disturbance of normal bowel function. Other terms used are spastic colon, mucous colitis, and irritable colon. It does not require surgery, nor does it lead to cancer. There is no cure for IBS. But with proper diet, stress reduction, and medication, you will find that your problems (symptoms) will gradually disappear or improve. IBS is a common digestive disorder. It usually appears in late adolescence or early adulthood. Women develop it twice as often as men.  CAUSES After food has been digested and absorbed in the small intestine, waste material is moved into the colon (large intestine). In the colon, water and salts are absorbed from the undigested products coming from the small intestine. The remaining residue, or fecal material, is held for elimination. Under normal circumstances, gentle, rhythmic contractions on the bowel walls push the fecal material along the colon towards the rectum. In IBS, however, these contractions are irregular and poorly coordinated. The fecal material is either retained too long, resulting in constipation, or expelled too soon, producing diarrhea.  SYMPTOMS  The most common symptom of IBS is pain. It is typically in the lower left side of the belly (abdomen). But it may occur anywhere in the abdomen. It can be felt as heartburn, backache, or even as a dull pain in the arms or shoulders. The pain comes from excessive bowel-muscle spasms and from the buildup of gas and fecal material in the colon. This pain:  Can range from sharp belly (abdominal) cramps to a dull, continuous ache.   Usually worsens soon after eating.   Is typically relieved by having a bowel movement or passing gas.  Abdominal pain is usually accompanied by constipation. But it may also produce  diarrhea. The diarrhea typically occurs right after a meal or upon arising in the morning. The stools are typically soft and watery. They are often flecked with secretions (mucus). Other symptoms of IBS include:  Bloating.  Loss of appetite.   Heartburn.  Feeling sick to your stomach  (nausea).   Belching  Vomiting   Gas.  IBS may also cause a number of symptoms that are unrelated to the digestive system:  Fatigue.  Headaches.   Anxiety  Shortness of breath   Difficulty in concentrating.  Dizziness.   These symptoms tend to come and go.  DIAGNOSIS The symptoms of IBS closely mimic the symptoms of other, more serious digestive disorders. So your caregiver may wish to perform a variety of additional tests to exclude these disorders. He/she wants to be certain of learning what is wrong (diagnosis). The nature and purpose of each test will be explained to you.  TREATMENT A number of medications are available to help correct bowel function and/or relieve bowel spasms and abdominal pain. Among the drugs available are:  Mild, non-irritating laxatives for severe constipation and to help restore normal bowel habits.   Specific anti-diarrheal medications to treat severe or prolonged diarrhea.   Anti-spasmodic agents to relieve intestinal cramps.   Your caregiver may also decide to treat you with a mild tranquilizer or sedative during unusually stressful periods in your life.    The important thing to remember is that if any drug is prescribed for you, make sure that you take it exactly as directed. Make sure that your caregiver knows how well it worked for you.  HOME CARE INSTRUCTIONS   Avoid foods that are high in fat or oils. Some examples [id number] cream, butter, frankfurters, sausage, and other fatty meats.   Avoid foods that have a laxative effect, such as fruit, fruit juice, and dairy products.   Cut out carbonated drinks, chewing gum, and "gassy" foods, such as beans and  cabbage. This may help relieve bloating and belching.   Bran taken with plenty of liquids may help relieve constipation.   Keep track of what foods seem to trigger your symptoms.   Avoid emotionally charged situations or circumstances that produce anxiety.   Start or continue exercising.   Get plenty of rest and sleep.  Document Released: 06/16/2005 Document Re-Released: 11/02/2008 [facility] Patient Information 2011 [place], [place].

## 2011-01-07 NOTE — Progress Notes (Signed)
Subjective:    Patient ID: Kim Grimes, female    DOB: 04/01/1951, 60 y.o.   MRN: [id number]  PCP: [facility]  HPI Exercising, but having back pain. Able to workout 3x/wk. Queasy feeling in stomach 3x/week. BMs: can be 3x/day-bad day, good day:1x/day. Can have days when she goes once a week: still diarrhea. Nausea: a little, 1-2x/wk. Reflux Sx occur-this week, burning in throat. Had a Pepsi and drinking chocolate protein drinks. Also drinks Green Tea. Drinks coffee, caffeinated. Needs refill on Prevacid.   Past Medical History  Diagnosis Date  . IBS (irritable bowel syndrome) 2003  . Anxiety disorder   . Endometriosis   . Chronic pelvic pain in female   . Osteoporosis   . Helicobacter pylori gastritis     Dr. Juanda Chance   . Hiatal hernia 2003    on UGI   . GERD (gastroesophageal reflux disease)   . Hypothyroidism   . Obesity (BMI 30-39.9) DEC 2011 145 LBS   Past Surgical History  Procedure Date  . Vesicovaginal fistula closure w/ tah   . Laparoscopy   . Adhesiolysis     dense adhesion between rectum and sigmoid,  & pelvis   . Salpingoophorectomy   . Rt foot sx   . Colonoscopy 03: AP/D & 08: SCREENING    WNL'S  . Upper gastrointestinal endoscopy DEC 2010: DYSPEPSIA/DYSPHAGIA    SCH RING/ESO DIL 16 MM, GASTRITIS/DUODENITIS 2o to Aleve/on OMP PRN   Allergies  Allergen Reactions  . Diazepam     REACTION: aggitation   Current Outpatient Prescriptions  Medication Sig Dispense Refill  . estradiol (ESTRACE) 1 MG tablet Take 1 mg by mouth daily.        . lansoprazole (PREVACID) 30 MG capsule Take 1 capsule (30 mg total) by mouth 2 (two) times daily.  120 capsule  3  . levothyroxine (SYNTHROID, LEVOTHROID) 75 MCG tablet Take 75 mcg by mouth daily.        Marland Kitchen DISCONTD: lansoprazole (PREVACID) 30 MG capsule Take 30 mg by mouth 2 (two) times daily.        . diazepam (VALIUM) 2 MG tablet Take 1 tablet (2 mg total) by mouth every 8 (eight) hours as needed for anxiety. Take 1-2 tablets  by mouth every 8 hours as needed for muscle spasms  30 tablet  3  . PARoxetine (PAXIL) 20 MG tablet Take 20 mg by mouth at bedtime as needed.         Family History  Problem Relation Age of Onset  . Prostate cancer Father   . Colon cancer Father     < 60 YO  .         Marland Kitchen Pancreatic cancer Mother   .         Marland Kitchen Diabetes Mother   . Diabetes Brother   . Colon polyps Neg Hx      Review of Systems  All other systems reviewed and are negative.       Objective:   Physical Exam  Vitals reviewed. Constitutional: She is oriented to person, place, and time. She appears well-developed and well-nourished. No distress.  HENT:  Head: Normocephalic and atraumatic.  Cardiovascular: Normal rate, regular rhythm and normal heart sounds.   Pulmonary/Chest: Effort normal and breath sounds normal.  Abdominal: Soft. Bowel sounds are normal. She exhibits no distension. There is tenderness (mild TTP in LLQ).  Musculoskeletal: She exhibits no edema.  Neurological: She is alert and oriented to person, place, and time.  Assessment & Plan:

## 2011-01-14 NOTE — Progress Notes (Signed)
Reminder in epic for 70m follow appt

## 2011-02-21 ENCOUNTER — Ambulatory Visit: Payer: BC Managed Care – PPO | Admitting: Family Medicine

## 2011-06-02 ENCOUNTER — Other Ambulatory Visit: Payer: Self-pay | Admitting: Obstetrics and Gynecology

## 2011-06-02 DIAGNOSIS — Z1231 Encounter for screening mammogram for malignant neoplasm of breast: Secondary | ICD-10-CM

## 2011-06-18 ENCOUNTER — Encounter: Payer: Self-pay | Admitting: Gastroenterology

## 2011-07-16 ENCOUNTER — Encounter: Payer: Self-pay | Admitting: Gastroenterology

## 2011-07-16 ENCOUNTER — Ambulatory Visit (INDEPENDENT_AMBULATORY_CARE_PROVIDER_SITE_OTHER): Payer: BC Managed Care – PPO | Admitting: Gastroenterology

## 2011-07-16 VITALS — BP 106/76 | HR 80 | Temp 98.1°F | Ht <= 58 in | Wt 141.2 lb

## 2011-07-16 DIAGNOSIS — R1030 Lower abdominal pain, unspecified: Secondary | ICD-10-CM

## 2011-07-16 DIAGNOSIS — Z1211 Encounter for screening for malignant neoplasm of colon: Secondary | ICD-10-CM

## 2011-07-16 DIAGNOSIS — R109 Unspecified abdominal pain: Secondary | ICD-10-CM

## 2011-07-16 MED ORDER — SOD PHOS MONO-SOD PHOS DIBASIC 1.102-0.398 G PO TABS: ORAL_TABLET | ORAL | Status: DC

## 2011-07-16 NOTE — Assessment & Plan Note (Signed)
MOST LIKELY DUE TO IBS FLARE.  UA TO LOOK FOR CYSTITIS. FOLLOW UP IN 6 MOS.

## 2011-07-16 NOTE — Patient Instructions (Signed)
Your symptoms are most likely due to IBS.  SUBMIT YOUR URINE SAMPLE. COLONOSCOPY JAN 28. FOLLOW UP IN 6 MOS.  Irritable Bowel Syndrome (Spastic Colon) Irritable Bowel Syndrome (IBS) is caused by a disturbance of normal bowel function. Other terms used are spastic colon, mucous colitis, and irritable colon. It does not require surgery, nor does it lead to cancer. There is no cure for IBS. But with proper diet, stress reduction, and medication, you will find that your problems (symptoms) will gradually disappear or improve. IBS is a common digestive disorder. It usually appears in late adolescence or early adulthood. Women develop it twice as often as men.  CAUSES After food has been digested and absorbed in the small intestine, waste material is moved into the colon (large intestine). In the colon, water and salts are absorbed from the undigested products coming from the small intestine. The remaining residue, or fecal material, is held for elimination. Under normal circumstances, gentle, rhythmic contractions on the bowel walls push the fecal material along the colon towards the rectum. In IBS, however, these contractions are irregular and poorly coordinated. The fecal material is either retained too long, resulting in constipation, or expelled too soon, producing diarrhea.  SYMPTOMS  The most common symptom of IBS is pain. It is typically in the lower left side of the belly (abdomen). But it may occur anywhere in the abdomen. It can be felt as heartburn, backache, or even as a dull pain in the arms or shoulders. The pain comes from excessive bowel-muscle spasms and from the buildup of gas and fecal material in the colon. This pain:   Can range from sharp belly (abdominal) cramps to a dull, continuous ache.   Usually worsens soon after eating.   Is typically relieved by having a bowel movement or passing gas.   Abdominal pain is usually accompanied by constipation. But it may also produce  diarrhea. The diarrhea typically occurs right after a meal or upon arising in the morning. The stools are typically soft and watery. They are often flecked with secretions (mucus).  Other symptoms of IBS include:  Bloating.  Loss of appetite.   Heartburn.  Feeling sick to your stomach  (nausea).   Belching  Vomiting   Gas.  IBS may also cause a number of symptoms that are unrelated to the digestive system:  Fatigue.  Headaches.   Anxiety  Shortness of breath   Difficulty in concentrating.  Dizziness.   These symptoms tend to come and go.  TREATMENT A number of medications are available to help correct bowel function and/or relieve bowel spasms and abdominal pain. Among the drugs available are:   Mild, non-irritating laxatives for severe constipation and to help restore normal bowel habits.   Specific anti-diarrheal medications to treat severe or prolonged diarrhea.   Anti-spasmodic agents to relieve intestinal cramps.   HOME CARE INSTRUCTIONS   Avoid foods that are high in fat or oils. Some examples ZOX:WRUEA cream, butter, frankfurters, sausage, and other fatty meats.   Avoid foods that have a laxative effect, such as fruit, fruit juice, and dairy products.   Cut out carbonated drinks, chewing gum, and "gassy" foods, such as beans and cabbage. This may help relieve bloating and belching.   Bran taken with plenty of liquids may help relieve constipation.   Keep track of what foods seem to trigger your symptoms.   Avoid emotionally charged situations or circumstances that produce anxiety.   Start or continue exercising.   Get plenty  of rest and sleep.

## 2011-07-16 NOTE — Progress Notes (Signed)
Cc to PCP 

## 2011-07-16 NOTE — Assessment & Plan Note (Addendum)
FATHER HAD COLON CA AGE < 60. LAST TCS 2008.  TCS JAN 2013.

## 2011-07-16 NOTE — Progress Notes (Signed)
  Subjective:    Patient ID: Kim Grimes, female    DOB: 12/22/1950, 61 y.o.   MRN: [id number]  PCP: SIMPSON, MD  HPI Since last visit had one flare 3 weeks ago to where it hurt in her lower abdomen in LLQ but then got  real tender on left side. Fells like flare is still going on. No nausea or vomiting. Had diarrhea 3/day-no blood. ?one stool looked red. Has rectal itching and burning. Hasn't tried anything yet. Bms: now hard stool & then nl stool yesterday. Feels bloated when she drinks carbonated. ALSO DRINKS DECAF COFFEE. NO DYSURIA OR HEMATURIA.  Past Medical History  Diagnosis Date  . IBS (irritable bowel syndrome) 2003  . Anxiety disorder   . Endometriosis   . Chronic pelvic pain in female   . Osteoporosis   . Helicobacter pylori gastritis     Dr. Brodie   . Hiatal hernia 2003    on UGI   . GERD (gastroesophageal reflux disease)   . Hypothyroidism   . Obesity (BMI 30-39.9) DEC 2011 145 LBS   Past Surgical History  Procedure Date  . Vesicovaginal fistula closure w/ tah   . Laparoscopy   . Adhesiolysis     dense adhesion between rectum and sigmoid,  & pelvis   . Salpingoophorectomy   . Rt foot sx   . Colonoscopy 03: AP/D & 08: SCREENING    WNL'S  . Upper gastrointestinal endoscopy DEC 2010: DYSPEPSIA/DYSPHAGIA    SCH RING/ESO DIL 16 MM, GASTRITIS/DUODENITIS 2o to Aleve/on OMP PRN       Review of Systems     Objective:   Physical Exam  Vitals reviewed. Constitutional: She is oriented to person, place, and time. She appears well-developed and well-nourished. No distress.  HENT:  Head: Normocephalic and atraumatic.  Mouth/Throat: Oropharynx is clear and moist. No oropharyngeal exudate.  Eyes: Pupils are equal, round, and reactive to light. No scleral icterus.  Neck: Neck supple.  Cardiovascular: Normal rate, regular rhythm and normal heart sounds.   Pulmonary/Chest: Effort normal and breath sounds normal. No respiratory distress.  Abdominal: Bowel sounds  are normal. She exhibits no distension. There is tenderness (MILD TTP IN LLQ). There is no rebound and no guarding.  Musculoskeletal: Normal range of motion. She exhibits no edema.  Lymphadenopathy:    She has no cervical adenopathy.  Neurological: She is alert and oriented to person, place, and time.       NO FOCAL DEFICITS   Skin: She is not diaphoretic.  Psychiatric:       ANXIOUS MOOD          Assessment & Plan:   

## 2011-07-17 LAB — URINALYSIS, ROUTINE W REFLEX MICROSCOPIC
Bilirubin Urine: NEGATIVE
Glucose, UA: NEGATIVE mg/dL
Hgb urine dipstick: NEGATIVE
Ketones, ur: NEGATIVE mg/dL
Leukocytes, UA: NEGATIVE
Nitrite: NEGATIVE
Protein, ur: NEGATIVE mg/dL
Specific Gravity, Urine: 1.019 (ref 1.005–1.030)
Urobilinogen, UA: 0.2 mg/dL (ref 0.0–1.0)
pH: 6.5 (ref 5.0–8.0)

## 2011-07-17 NOTE — Progress Notes (Signed)
Quick Note:  Pt called back and was informed. ______ 

## 2011-07-17 NOTE — Progress Notes (Signed)
Quick Note:  LMOM for pt to call. ______ 

## 2011-07-22 ENCOUNTER — Encounter (HOSPITAL_COMMUNITY): Payer: Self-pay | Admitting: Pharmacy Technician

## 2011-07-25 MED ORDER — SODIUM CHLORIDE 0.45 % IV SOLN
Freq: Once | INTRAVENOUS | Status: AC
  Administered 2011-07-28: 10:00:00 via INTRAVENOUS

## 2011-07-25 NOTE — Progress Notes (Signed)
Reminder in epic to follow up in 6 months °

## 2011-07-27 NOTE — Interval H&P Note (Signed)
History and Physical Interval Note:  07/27/2011 4:46 PM  Kim Grimes  has presented today for surgery, with the diagnosis of screening  The various methods of treatment have been discussed with the patient and family. After consideration of risks, benefits and other options for treatment, the patient has consented to  Procedure(s): COLONOSCOPY as a surgical intervention .  The patients' history has been reviewed, patient examined, no change in status, stable for surgery.  I have reviewed the patients' chart and labs.  Questions were answered to the patient's satisfaction.     [employer]

## 2011-07-27 NOTE — H&P (View-Only) (Signed)
  Subjective:    Patient ID: Kim Grimes, female    DOB: 07-29-1950, 61 y.o.   MRN: [id number]  PCP: Lodema Hong, MD  HPI Since last visit had one flare 3 weeks ago to where it hurt in her lower abdomen in LLQ but then got  real tender on left side. Fells like flare is still going on. No nausea or vomiting. Had diarrhea 3/day-no blood. ?one stool looked red. Has rectal itching and burning. Hasn't tried anything yet. Bms: now hard stool & then nl stool yesterday. Feels bloated when she drinks carbonated. ALSO DRINKS DECAF COFFEE. NO DYSURIA OR HEMATURIA.  Past Medical History  Diagnosis Date  . IBS (irritable bowel syndrome) 2003  . Anxiety disorder   . Endometriosis   . Chronic pelvic pain in female   . Osteoporosis   . Helicobacter pylori gastritis     Dr. Juanda Chance   . Hiatal hernia 2003    on UGI   . GERD (gastroesophageal reflux disease)   . Hypothyroidism   . Obesity (BMI 30-39.9) DEC 2011 145 LBS   Past Surgical History  Procedure Date  . Vesicovaginal fistula closure w/ tah   . Laparoscopy   . Adhesiolysis     dense adhesion between rectum and sigmoid,  & pelvis   . Salpingoophorectomy   . Rt foot sx   . Colonoscopy 03: AP/D & 08: SCREENING    WNL'S  . Upper gastrointestinal endoscopy DEC 2010: DYSPEPSIA/DYSPHAGIA    SCH RING/ESO DIL 16 MM, GASTRITIS/DUODENITIS 2o to Aleve/on OMP PRN       Review of Systems     Objective:   Physical Exam  Vitals reviewed. Constitutional: She is oriented to person, place, and time. She appears well-developed and well-nourished. No distress.  HENT:  Head: Normocephalic and atraumatic.  Mouth/Throat: Oropharynx is clear and moist. No oropharyngeal exudate.  Eyes: Pupils are equal, round, and reactive to light. No scleral icterus.  Neck: Neck supple.  Cardiovascular: Normal rate, regular rhythm and normal heart sounds.   Pulmonary/Chest: Effort normal and breath sounds normal. No respiratory distress.  Abdominal: Bowel sounds  are normal. She exhibits no distension. There is tenderness (MILD TTP IN LLQ). There is no rebound and no guarding.  Musculoskeletal: Normal range of motion. She exhibits no edema.  Lymphadenopathy:    She has no cervical adenopathy.  Neurological: She is alert and oriented to person, place, and time.       NO FOCAL DEFICITS   Skin: She is not diaphoretic.  Psychiatric:       ANXIOUS MOOD          Assessment & Plan:

## 2011-07-28 ENCOUNTER — Encounter (HOSPITAL_COMMUNITY)
Admission: RE | Disposition: A | Discharge status: Home / Self Care | Payer: Self-pay | Source: Ambulatory Visit | Attending: Gastroenterology

## 2011-07-28 ENCOUNTER — Other Ambulatory Visit: Payer: Self-pay | Admitting: Gastroenterology

## 2011-07-28 ENCOUNTER — Encounter (HOSPITAL_COMMUNITY): Payer: Self-pay

## 2011-07-28 ENCOUNTER — Ambulatory Visit (HOSPITAL_COMMUNITY)
Admission: RE | Admit: 2011-07-28 | Discharge: 2011-07-28 | Disposition: A | Discharge status: Home / Self Care | Payer: BC Managed Care – PPO | Source: Ambulatory Visit | Attending: Gastroenterology | Admitting: Gastroenterology

## 2011-07-28 DIAGNOSIS — Z800 Family history of malignant neoplasm of digestive organs: Secondary | ICD-10-CM | POA: Insufficient documentation

## 2011-07-28 DIAGNOSIS — D126 Benign neoplasm of colon, unspecified: Secondary | ICD-10-CM | POA: Insufficient documentation

## 2011-07-28 DIAGNOSIS — K648 Other hemorrhoids: Secondary | ICD-10-CM | POA: Insufficient documentation

## 2011-07-28 DIAGNOSIS — Z1211 Encounter for screening for malignant neoplasm of colon: Secondary | ICD-10-CM

## 2011-07-28 HISTORY — DX: Nausea with vomiting, unspecified: R11.2

## 2011-07-28 HISTORY — DX: Other specified postprocedural states: Z98.890

## 2011-07-28 HISTORY — PX: COLONOSCOPY: SHX5424

## 2011-07-28 SURGERY — COLONOSCOPY
Anesthesia: Moderate Sedation

## 2011-07-28 MED ORDER — STERILE WATER FOR IRRIGATION IR SOLN
Status: DC | PRN
  Administered 2011-07-28: 11:00:00

## 2011-07-28 MED ORDER — PROMETHAZINE HCL 25 MG/ML IJ SOLN
INTRAMUSCULAR | Status: DC | PRN
  Administered 2011-07-28: 12.5 mg via INTRAVENOUS

## 2011-07-28 MED ORDER — MIDAZOLAM HCL 5 MG/5ML IJ SOLN
INTRAMUSCULAR | Status: DC | PRN
  Administered 2011-07-28: 1 mg via INTRAVENOUS
  Administered 2011-07-28: 2 mg via INTRAVENOUS

## 2011-07-28 MED ORDER — MEPERIDINE HCL 100 MG/ML IJ SOLN
INTRAMUSCULAR | Status: DC | PRN
  Administered 2011-07-28: 50 mg via INTRAVENOUS

## 2011-07-28 MED ORDER — PROMETHAZINE HCL 25 MG/ML IJ SOLN
INTRAMUSCULAR | Status: AC
  Filled 2011-07-28: qty 1

## 2011-07-28 MED ORDER — MIDAZOLAM HCL 5 MG/5ML IJ SOLN
INTRAMUSCULAR | Status: AC
  Filled 2011-07-28: qty 10

## 2011-07-28 MED ORDER — MEPERIDINE HCL 100 MG/ML IJ SOLN
INTRAMUSCULAR | Status: AC
  Filled 2011-07-28: qty 2

## 2011-07-28 MED ORDER — SODIUM CHLORIDE 0.9 % IJ SOLN
INTRAMUSCULAR | Status: AC
  Filled 2011-07-28: qty 10

## 2011-07-28 NOTE — Discharge Instructions (Signed)
You had 1 small polyps removed. You have internal hemorrhoids. THE LAST PART OF YOUR SMALL BOWEL IS NORMAL.  Next colonoscopy in 5 years. FOLLOW A HIGH FIBER DIET. AVOID ITEMS THAT CAUSE BLOATING. SEE INFO BELOW. YOUR BIOPSY RESULTS SHOULD BE BACK IN 7 DAYS.  ENDOSCOPY Care After Read the instructions outlined below and refer to this sheet in the next week. These discharge instructions provide you with general information on caring for yourself after you leave the hospital. While your treatment has been planned according to the most current medical practices available, unavoidable complications occasionally occur. If you have any problems or questions after discharge, call DR. Jaceon Heiberger, [phone].  ACTIVITY  You may resume your regular activity, but move at a slower pace for the next 24 hours.   Take frequent rest periods for the next 24 hours.   Walking will help get rid of the air and reduce the bloated feeling in your belly (abdomen).   No driving for 24 hours (because of the medicine (anesthesia) used during the test).   You may shower.   Do not sign any important legal documents or operate any machinery for 24 hours (because of the anesthesia used during the test).    NUTRITION  Drink plenty of fluids.   You may resume your normal diet as instructed by your doctor.   Begin with a light meal and progress to your normal diet. Heavy or fried foods are harder to digest and may make you feel sick to your stomach (nauseated).   Avoid alcoholic beverages for 24 hours or as instructed.    MEDICATIONS  You may resume your normal medications.   WHAT YOU CAN EXPECT TODAY  Some feelings of bloating in the abdomen.   Passage of more gas than usual.   Spotting of blood in your stool or on the toilet paper  .  IF YOU HAD POLYPS REMOVED DURING THE ENDOSCOPY:  Eat a soft diet IF YOU HAVE NAUSEA, BLOATING, ABDOMINAL PAIN, OR VOMITING.    FINDING OUT THE RESULTS OF YOUR  TEST Not all test results are available during your visit. DR. Darrick Penna WILL CALL YOU WITHIN 7 DAYS OF YOUR PROCEDUE WITH YOUR RESULTS. Do not assume everything is normal if you have not heard from DR. Alverda Nazzaro IN ONE WEEK, CALL HER OFFICE AT [phone].  SEEK IMMEDIATE MEDICAL ATTENTION AND CALL THE OFFICE: [phone] IF:  You have more than a spotting of blood in your stool.   Your belly is swollen (abdominal distention).   You are nauseated or vomiting.   You have a temperature over 101F.   You have abdominal pain or discomfort that is severe or gets worse throughout the day.  Polyps, Colon  A polyp is extra tissue that grows inside your body. Colon polyps grow in the large intestine. The large intestine, also called the colon, is part of your digestive system. It is a long, hollow tube at the end of your digestive tract where your body makes and stores stool. Most polyps are not dangerous. They are benign. This means they are not cancerous. But over time, some types of polyps can turn into cancer. Polyps that are smaller than a pea are usually not harmful. But larger polyps could someday become or may already be cancerous. To be safe, doctors remove all polyps and test them.   WHO GETS POLYPS? Anyone can get polyps, but certain people are more likely than others. You may have a greater chance of getting polyps  if:  You are over 50.   You have had polyps before.   Someone in your family has had polyps.   Someone in your family has had cancer of the large intestine.   Find out if someone in your family has had polyps. You may also be more likely to get polyps if you:   Eat a lot of fatty foods   Smoke   Drink alcohol   Do not exercise  Eat too much   TREATMENT  The caregiver will remove the polyp during sigmoidoscopy or colonoscopy.  PREVENTION There is not one sure way to prevent polyps. You might be able to lower your risk of getting them if you:  Eat more fruits and  vegetables and less fatty food.   Do not smoke.   Avoid alcohol.   Exercise every day.   Lose weight if you are overweight.   Eating more calcium and folate can also lower your risk of getting polyps. Some foods that are rich in calcium are milk, cheese, and broccoli. Some foods that are rich in folate are chickpeas, kidney beans, and spinach.   High-Fiber Diet A high-fiber diet changes your normal diet to include more whole grains, legumes, fruits, and vegetables. Changes in the diet involve replacing refined carbohydrates with unrefined foods. The calorie level of the diet is essentially unchanged. The Dietary Reference Intake (recommended amount) for adult males is 38 grams per day. For adult females, it is 25 grams per day. Pregnant and lactating women should consume 28 grams of fiber per day. Fiber is the intact part of a plant that is not broken down during digestion. Functional fiber is fiber that has been isolated from the plant to provide a beneficial effect in the body. PURPOSE  Increase stool bulk.   Ease and regulate bowel movements.   Lower cholesterol.  INDICATIONS THAT YOU NEED MORE FIBER  Constipation and hemorrhoids.   Uncomplicated diverticulosis (intestine condition) and irritable bowel syndrome.   Weight management.   As a protective measure against hardening of the arteries (atherosclerosis), diabetes, and cancer.   GUIDELINES FOR INCREASING FIBER IN THE DIET  Start adding fiber to the diet slowly. A gradual increase of about 5 more grams (2 slices of whole-wheat bread, 2 servings of most fruits or vegetables, or 1 bowl of high-fiber cereal) per day is best. Too rapid an increase in fiber may result in constipation, flatulence, and bloating.   Drink enough water and fluids to keep your urine clear or pale yellow. Water, juice, or caffeine-free drinks are recommended. Not drinking enough fluid may cause constipation.   Eat a variety of high-fiber foods rather  than one type of fiber.   Try to increase your intake of fiber through using high-fiber foods rather than fiber pills or supplements that contain small amounts of fiber.   The goal is to change the types of food eaten. Do not supplement your present diet with high-fiber foods, but replace foods in your present diet.  INCLUDE A VARIETY OF FIBER SOURCES  Replace refined and processed grains with whole grains, canned fruits with fresh fruits, and incorporate other fiber sources. White rice, white breads, and most bakery goods contain little or no fiber.   Brown whole-grain rice, buckwheat oats, and many fruits and vegetables are all good sources of fiber. These include: broccoli, Brussels sprouts, cabbage, cauliflower, beets, sweet potatoes, white potatoes (skin on), carrots, tomatoes, eggplant, squash, berries, fresh fruits, and dried fruits.   Cereals appear  to be the richest source of fiber. Cereal fiber is found in whole grains and bran. Bran is the fiber-rich outer coat of cereal grain, which is largely removed in refining. In whole-grain cereals, the bran remains. In breakfast cereals, the largest amount of fiber is found in those with "bran" in their names. The fiber content is sometimes indicated on the label.   You may need to include additional fruits and vegetables each day.   In baking, for 1 cup white flour, you may use the following substitutions:   1 cup whole-wheat flour minus 2 tablespoons.   1/2 cup white flour plus 1/2 cup whole-wheat flour.   Hemorrhoids Hemorrhoids are dilated (enlarged) veins around the rectum. Sometimes clots will form in the veins. This makes them swollen and painful. These are called thrombosed hemorrhoids. Causes of hemorrhoids include:  Constipation.   Straining to have a bowel movement.   HEAVY LIFTING HOME CARE INSTRUCTIONS  Eat a well balanced diet and drink 6 to 8 glasses of water every day to avoid constipation. You may also use a bulk  laxative.   Avoid straining to have bowel movements.   Keep anal area dry and clean.   Do not use a donut shaped pillow or sit on the toilet for long periods. This increases blood pooling and pain.   Move your bowels when your body has the urge; this will require less straining and will decrease pain and pressure.

## 2011-07-28 NOTE — Op Note (Signed)
[facility] [address] [place], [place]  [address]  COLONOSCOPY PROCEDURE REPORT  PATIENT:  Kim Grimes, Kim Grimes  MR#:  [id number] BIRTHDATE:  [birth year], 61 yrs. old  GENDER:  female  ENDOSCOPIST:  Jonette Eva, MD REF. BY:  [name], M.D. ASSISTANT:  PROCEDURE DATE:  07/28/2011 PROCEDURE:  ILEOColonoscopy with biopsy  INDICATIONS:  SCREENING FAM hX COLON CA AGE < 60 PT REQUESTED EGD DUE TO ABD PIN THAT OCURRED AFTER SHE STARTED VOMITING FROM THE OSMOPREP.  MEDICATIONS:   Demerol 50 mg IV, Versed 3 mg IV, promethazine (Phenergan) 12.5 mg IV  DESCRIPTION OF PROCEDURE:    Physical exam was performed. Informed consent was obtained from the patient after explaining the benefits, risks, and alternatives to procedure.  The patient was connected to monitor and placed in left lateral position. Continuous oxygen was provided by nasal cannula and IV medicine administered through an indwelling cannula.  After administration of sedation and rectal exam, the patient's rectum was intubated and the EC-[id number] ([id number]) colonoscope was advanced under direct visualization to the ILEUM.  The scope was removed slowly by carefully examining the color, texture, anatomy, and integrity mucosa on the way out.  The patient was recovered in endoscopy and discharged home in satisfactory condition. <<PROCEDUREIMAGES>>  FINDINGS:  A 2 MM sessile polyp was found in the ascending colon  REMOVED VIA COLD FORCEPS. NL ILEUM  10 CM VISUALIZED. SMALL Internal Hemorrhoids were found.  PREP QUALITY: EXCELLENT CECAL W/D TIME:    10 minutes  COMPLICATIONS:    None  ENDOSCOPIC IMPRESSION: 1) Sessile polyp in the ascending colon 2) Internal hemorrhoids 3)ANGULATED RS JUNCTION  RECOMMENDATIONS: TCS IN 5 YEARS WITH  PREPOPIK, PEDS SCOPE, AND PT SUPINE/LLQ PRESSURE HIGH FIBER DIET AWAIT BIOPSIES IBS MANAGEMENT  REPEAT EXAM:  No  ______________________________ Jonette Eva,  MD  CC:  [name], M.D.  n. eSIGNED:   Sandi Fields at 07/28/2011 11:32 AM  Ashok Croon, [phone]

## 2011-07-30 ENCOUNTER — Telehealth: Payer: Self-pay | Admitting: Gastroenterology

## 2011-07-30 NOTE — Telephone Encounter (Signed)
Pt returned call. I called and got VM again. So I left complete message of results on her VM.

## 2011-07-30 NOTE — Telephone Encounter (Signed)
LMOM to call.

## 2011-07-30 NOTE — Telephone Encounter (Signed)
Please call pt. She had A simple adenoma removed from her colon. TCS in 5 years. High fiber diet.  

## 2011-07-30 NOTE — Telephone Encounter (Signed)
Results Cc to PCP  

## 2011-08-05 ENCOUNTER — Encounter (HOSPITAL_COMMUNITY): Payer: Self-pay | Admitting: Gastroenterology

## 2011-08-11 ENCOUNTER — Telehealth: Payer: Self-pay

## 2011-08-11 NOTE — Telephone Encounter (Signed)
Called pt. Said she had colonoscopy x 2 weeks ago. Ever since she has had a salty taste in her mouth. She thinks it came from the pills of her prep. She has not used anything to try to get rid of the taste. Brushes teeth only once a day in the AM. Has not been using mouthwash, she is afraid it will cause her to vomit. She has had some nausea. She has had to use a suppository or enema to have BM's. Last BM was yesterday and  it was very hard. Please advise!

## 2011-08-11 NOTE — Telephone Encounter (Signed)
Pt called this morning about a bad taste in her month and wanted to know if it was from her test. Can you please call her back at [phone] to see what we can do for her.

## 2011-08-11 NOTE — Telephone Encounter (Signed)
pLEASE CALL PT. A BAD TASTE AFTER tcs IS NOT A USUAL SIDE EFFECT ESPECIALLY 2 WEEKS LATER. sHE MAY TRY SALINE RINSES TWICE DIALY AND SEE HER DENTIST IF THE BAD TASTE CONTINUES. SHE SHOULD USE MOM CAPSULE 2 PO BID TO HELP SOFTEN STOOL.

## 2011-08-12 NOTE — Telephone Encounter (Signed)
Pt returned call and was informed.  

## 2011-08-12 NOTE — Telephone Encounter (Signed)
LMOM to call.

## 2011-08-18 ENCOUNTER — Other Ambulatory Visit: Payer: Self-pay | Admitting: Obstetrics and Gynecology

## 2011-08-26 ENCOUNTER — Other Ambulatory Visit: Payer: Self-pay | Admitting: Urgent Care

## 2011-08-26 ENCOUNTER — Telehealth: Payer: Self-pay

## 2011-08-26 DIAGNOSIS — K219 Gastro-esophageal reflux disease without esophagitis: Secondary | ICD-10-CM

## 2011-08-26 MED ORDER — LANSOPRAZOLE 30 MG PO CPDR: 30.0000 mg | DELAYED_RELEASE_CAPSULE | Freq: Two times a day (BID) | ORAL | Status: DC

## 2011-08-26 NOTE — Telephone Encounter (Signed)
Pt called and said she has been getting her prescriptions through a mail order/ [facility]. Her insurance has changed and she now needs prescription called to [facility] in [place] for the Lansoprazole 30 mg bid.

## 2011-09-04 ENCOUNTER — Telehealth: Payer: Self-pay | Admitting: Family Medicine

## 2011-09-04 NOTE — Telephone Encounter (Signed)
Needs an ov scheduled, has not been here since 09/2010, nurse cannot address this over telephone, pls explain and sched a visit

## 2011-09-12 NOTE — Telephone Encounter (Signed)
Pt has appt with dr. Lodema Hong on 09/18/2011 9:15. Pt was called and aware

## 2011-09-17 ENCOUNTER — Ambulatory Visit
Admission: RE | Admit: 2011-09-17 | Discharge: 2011-09-17 | Disposition: A | Discharge status: Home / Self Care | Payer: BC Managed Care – PPO | Source: Ambulatory Visit | Attending: Obstetrics and Gynecology | Admitting: Obstetrics and Gynecology

## 2011-09-17 DIAGNOSIS — Z1231 Encounter for screening mammogram for malignant neoplasm of breast: Secondary | ICD-10-CM

## 2011-09-18 ENCOUNTER — Encounter: Payer: Self-pay | Admitting: Family Medicine

## 2011-09-18 ENCOUNTER — Ambulatory Visit (INDEPENDENT_AMBULATORY_CARE_PROVIDER_SITE_OTHER): Payer: BC Managed Care – PPO | Admitting: Family Medicine

## 2011-09-18 VITALS — BP 114/78 | HR 76 | Resp 18 | Ht 59.5 in | Wt 140.0 lb

## 2011-09-18 DIAGNOSIS — E039 Hypothyroidism, unspecified: Secondary | ICD-10-CM

## 2011-09-18 DIAGNOSIS — R5383 Other fatigue: Secondary | ICD-10-CM

## 2011-09-18 DIAGNOSIS — Z23 Encounter for immunization: Secondary | ICD-10-CM

## 2011-09-18 DIAGNOSIS — R002 Palpitations: Secondary | ICD-10-CM

## 2011-09-18 DIAGNOSIS — Z13220 Encounter for screening for lipoid disorders: Secondary | ICD-10-CM

## 2011-09-18 DIAGNOSIS — R5381 Other malaise: Secondary | ICD-10-CM

## 2011-09-18 DIAGNOSIS — R0989 Other specified symptoms and signs involving the circulatory and respiratory systems: Secondary | ICD-10-CM

## 2011-09-18 DIAGNOSIS — Z2911 Encounter for prophylactic immunotherapy for respiratory syncytial virus (RSV): Secondary | ICD-10-CM

## 2011-09-18 NOTE — Progress Notes (Signed)
  Subjective:    Patient ID: Kim Grimes, female    DOB: Apr 10, 1951, 61 y.o.   MRN: [id number]  HPI The PT is here for follow up and re-evaluation of chronic medical conditions, medication management and review of any available recent lab and radiology data.  Preventive health is updated, specifically  Cancer screening and Immunization.   Questions or concerns regarding consultations or procedures which the PT has had in the interim are  addressed. The PT denies any adverse reactions to current medications since the last visit.  3 year h/o intermittent heart racing, and loud heartbeat in the  Right ear, happens only while asleep, on avg 3 to 4 times per month.Duration less than 5 mins, she also reports intermittent left chest discomfort, non radiating, not aggravated by activity, and not associated with light headedness       Review of Systems See HPI Denies recent fever or chills. Denies sinus pressure, nasal congestion, ear pain or sore throat. Denies chest congestion, productive cough or wheezing.  Denies abdominal pain, nausea, vomiting,diarrhea or constipation.   Denies dysuria, frequency, hesitancy or incontinence. Denies joint pain, swelling and limitation in mobility. Denies headaches, seizures, numbness, or tingling. Denies depression, does report mild anxiety, but nothing "out of the norm" Denies skin break down or rash.        Objective:   Physical Exam  Patient alert and oriented and in no cardiopulmonary distress.  HEENT: No facial asymmetry, EOMI, no sinus tenderness,  oropharynx pink and moist.  Neck supple no adenopathy.Carotid bruits  Chest: Clear to auscultation bilaterally.  CVS: S1, S2 no murmurs, no S3.  ABD: Soft non tender. Bowel sounds normal.  Ext: No edema  MS: Adequate ROM spine, shoulders, hips and knees.  Skin: Intact, no ulcerations or rash noted.  Psych: Good eye contact, normal affect. Memory intact mildly  anxious not   depressed appearing.  CNS: CN 2-12 intact, power, tone and sensation normal throughout.       Assessment & Plan:

## 2011-09-18 NOTE — Patient Instructions (Signed)
F/u in 6 month.  Your EKG is totally normal, you are referred for a study to evaluate neck arteries for blockage.  CBC , fasting chem 7, lipid and vit D end April please.  Your cholesterol was high and vit D level low last April, I will specifically look at these to see if you need any treatment.  Calcium 1200mg  with vit D 1000IU one gel capsule once daily is recommended for bone health. This is OTC  You will get TdaP and zostavax vaccines today  It is important that you exercise regularly at least 30 minutes 5 times a week. If you develop chest pain, have severe difficulty breathing, or feel very tired, stop exercising immediately and seek medical attention   A healthy diet is rich in fruit, vegetables and whole grains. Poultry fish, nuts and beans are a healthy choice for protein rather then red meat. A low sodium diet and drinking 64 ounces of water daily is generally recommended. Oils and sweet should be limited.  l. It is important to eat on a regular schedule, at least 3 times daily. Snacks should be primarily fruits, vegetables or nuts.

## 2011-09-20 NOTE — Assessment & Plan Note (Signed)
Intermittent palpitations, with atypical chest pain , office EKG: normal sinus rythm , no ischemic changes

## 2011-09-20 NOTE — Assessment & Plan Note (Signed)
Followed by endo.  

## 2011-09-20 NOTE — Assessment & Plan Note (Signed)
Refer for carotid doppler 

## 2011-09-22 ENCOUNTER — Other Ambulatory Visit (HOSPITAL_COMMUNITY): Payer: BC Managed Care – PPO

## 2011-09-24 ENCOUNTER — Ambulatory Visit (HOSPITAL_COMMUNITY)
Admission: RE | Admit: 2011-09-24 | Discharge: 2011-09-24 | Disposition: A | Discharge status: Home / Self Care | Payer: BC Managed Care – PPO | Source: Ambulatory Visit | Attending: Family Medicine | Admitting: Family Medicine

## 2011-09-24 DIAGNOSIS — R0989 Other specified symptoms and signs involving the circulatory and respiratory systems: Secondary | ICD-10-CM | POA: Insufficient documentation

## 2011-09-24 DIAGNOSIS — I6529 Occlusion and stenosis of unspecified carotid artery: Secondary | ICD-10-CM | POA: Insufficient documentation

## 2011-09-29 ENCOUNTER — Telehealth: Payer: Self-pay | Admitting: Family Medicine

## 2011-09-29 ENCOUNTER — Encounter: Payer: Self-pay | Admitting: Family Medicine

## 2011-10-01 ENCOUNTER — Other Ambulatory Visit: Payer: Self-pay | Admitting: Family Medicine

## 2011-10-01 DIAGNOSIS — I771 Stricture of artery: Secondary | ICD-10-CM

## 2011-10-01 NOTE — Telephone Encounter (Signed)
Can you interpret her ultrasound results. I don't know how to explain it to her

## 2011-10-01 NOTE — Telephone Encounter (Signed)
Pt aware that she needs further testing of arteries, she agrees to the test. Pls reprint her lab sheet, she is coming on Thursday to collect it.Pls order the chenm 7 only, as a sta, incase she can   have the test tomorrow , she is off work then   [employer] pls order study for pt that I am entering to further evaluate subclavian artery, pt off work tomorrow, so if she can get it then that would be good

## 2011-10-02 ENCOUNTER — Other Ambulatory Visit: Payer: Self-pay

## 2011-10-02 DIAGNOSIS — M549 Dorsalgia, unspecified: Secondary | ICD-10-CM

## 2011-10-02 NOTE — Telephone Encounter (Signed)
Pt received lab order and lab aware that only bmp should be stat.

## 2011-10-02 NOTE — Telephone Encounter (Signed)
Pt has appt at [facility] for 10/03/2011. Pt is aware

## 2011-10-03 ENCOUNTER — Ambulatory Visit (HOSPITAL_COMMUNITY)
Admission: RE | Admit: 2011-10-03 | Discharge: 2011-10-03 | Disposition: A | Discharge status: Home / Self Care | Payer: BC Managed Care – PPO | Source: Ambulatory Visit | Attending: Family Medicine | Admitting: Family Medicine

## 2011-10-03 DIAGNOSIS — R0989 Other specified symptoms and signs involving the circulatory and respiratory systems: Secondary | ICD-10-CM | POA: Insufficient documentation

## 2011-10-03 DIAGNOSIS — I771 Stricture of artery: Secondary | ICD-10-CM | POA: Insufficient documentation

## 2011-10-03 LAB — BASIC METABOLIC PANEL
BUN: 16 mg/dL (ref 6–23)
CO2: 29 mEq/L (ref 19–32)
Calcium: 9.6 mg/dL (ref 8.4–10.5)
Chloride: 105 mEq/L (ref 96–112)
Creat: 0.83 mg/dL (ref 0.50–1.10)
Glucose, Bld: 90 mg/dL (ref 70–99)
Potassium: 4.1 mEq/L (ref 3.5–5.3)
Sodium: 141 mEq/L (ref 135–145)

## 2011-10-03 LAB — CBC
HCT: 42.1 % (ref 36.0–46.0)
Hemoglobin: 13.6 g/dL (ref 12.0–15.0)
MCH: 29.1 pg (ref 26.0–34.0)
MCHC: 32.3 g/dL (ref 30.0–36.0)
MCV: 90.1 fL (ref 78.0–100.0)
Platelets: 239 10*3/uL (ref 150–400)
RBC: 4.67 MIL/uL (ref 3.87–5.11)
RDW: 13.5 % (ref 11.5–15.5)
WBC: 4.3 10*3/uL (ref 4.0–10.5)

## 2011-10-03 LAB — VITAMIN D 25 HYDROXY (VIT D DEFICIENCY, FRACTURES): Vit D, 25-Hydroxy: 22 ng/mL — ABNORMAL LOW (ref 30–89)

## 2011-10-03 LAB — LIPID PANEL
Cholesterol: 209 mg/dL — ABNORMAL HIGH (ref 0–200)
HDL: 68 mg/dL (ref >39)
LDL Cholesterol: 123 mg/dL — ABNORMAL HIGH (ref 0–99)
Total CHOL/HDL Ratio: 3.1 Ratio
Triglycerides: 89 mg/dL (ref <150)
VLDL: 18 mg/dL (ref 0–40)

## 2011-10-03 MED ORDER — GADOBENATE DIMEGLUMINE 529 MG/ML IV SOLN
20.0000 mL | Freq: Once | INTRAVENOUS | Status: AC | PRN
  Administered 2011-10-03: 20 mL via INTRAVENOUS

## 2011-10-07 ENCOUNTER — Telehealth: Payer: Self-pay | Admitting: Family Medicine

## 2011-10-07 NOTE — Telephone Encounter (Signed)
Advised pt no blockage in subclavian arteries and no further tests needed.  Reviewed labs , she is to take calcium with D 1200mg  /1000IU one gel capsule once daily  Also advised her to reduce fatty food intake to improve her cholesterol

## 2011-10-13 ENCOUNTER — Telehealth: Payer: Self-pay | Admitting: Family Medicine

## 2011-10-13 NOTE — Telephone Encounter (Signed)
Nothing else needs to be done at this time, pls let her know, (I discussed this with her personally already)

## 2011-10-13 NOTE — Telephone Encounter (Signed)
Pt had angiogram 2 weeks ago and had some blockages and wants to know if there is anything she needs to be doing. Wants a call back

## 2011-10-13 NOTE — Telephone Encounter (Signed)
Called patient and left message for them to return call at the office   

## 2011-11-20 ENCOUNTER — Telehealth: Payer: Self-pay

## 2011-11-20 NOTE — Telephone Encounter (Signed)
VM from [place], from [employer] of [address].  I transferred VM to [facility] phone. She was calling in reference to the pt's billing and the coding of colonoscopy. The number called from was [phone].

## 2011-11-25 NOTE — Telephone Encounter (Signed)
I called and spoke with a rep about the patient.  They wanted know what codes were used for the claim.

## 2011-12-09 ENCOUNTER — Telehealth: Payer: Self-pay

## 2011-12-09 NOTE — Telephone Encounter (Signed)
Needs next available regular OV please

## 2011-12-09 NOTE — Telephone Encounter (Signed)
LMOM needs to schedule OV.

## 2011-12-09 NOTE — Telephone Encounter (Signed)
Pt called and said that she is taking Lansoprazole 30 mg bid and for the last 2 weeks she has had a burning in her throat that she cannot get rid of. Please advise!

## 2011-12-10 NOTE — Telephone Encounter (Signed)
Informed pt. She has OV on 12/30/2011 @ 8:30 Am with Tana Coast, PA.

## 2011-12-29 ENCOUNTER — Encounter: Payer: Self-pay | Admitting: Gastroenterology

## 2011-12-30 ENCOUNTER — Encounter: Payer: Self-pay | Admitting: Gastroenterology

## 2011-12-30 ENCOUNTER — Ambulatory Visit (INDEPENDENT_AMBULATORY_CARE_PROVIDER_SITE_OTHER): Payer: BC Managed Care – PPO | Admitting: Gastroenterology

## 2011-12-30 ENCOUNTER — Other Ambulatory Visit: Payer: Self-pay | Admitting: Gastroenterology

## 2011-12-30 VITALS — BP 125/83 | HR 72 | Temp 97.3°F | Ht 58.5 in | Wt 139.4 lb

## 2011-12-30 DIAGNOSIS — R1310 Dysphagia, unspecified: Secondary | ICD-10-CM | POA: Insufficient documentation

## 2011-12-30 DIAGNOSIS — J387 Other diseases of larynx: Secondary | ICD-10-CM

## 2011-12-30 DIAGNOSIS — K219 Gastro-esophageal reflux disease without esophagitis: Secondary | ICD-10-CM

## 2011-12-30 MED ORDER — DEXLANSOPRAZOLE 60 MG PO CPDR: 60.0000 mg | DELAYED_RELEASE_CAPSULE | Freq: Every day | ORAL | Status: DC

## 2011-12-30 NOTE — Assessment & Plan Note (Signed)
Odynophagia for three weeks. Patient reports taking lansoprazole 60mg  in am, but now 30mg  bid for 3 days. No better. No typical heartburn. No dysphagia. Sticking with soft foods due to pain. No erythema of pharynx today on exam. ?LPR vs viral esophagitis vs candida esophagitis. Recommend EGD/ED in near future.  I have discussed the risks, alternatives, benefits with regards to but not limited to the risk of reaction to medication, bleeding, infection, perforation and the patient is agreeable to proceed. Written consent to be obtained.  Stop lansoprazole. Start Dexilant 60mg  daily in am before breakfast. Sampels provided.   Call if symptoms worsen. If EGD unremarkable and she does not respond to change in PPI, she may need ENT evaluation.

## 2011-12-30 NOTE — Progress Notes (Signed)
Faxed to PCP

## 2011-12-30 NOTE — Patient Instructions (Addendum)
Stop lansoprazole. Start Dexilant 60mg  30 minutes before breakfast daily.  Upper endoscopy as planned.

## 2011-12-30 NOTE — Progress Notes (Signed)
Primary Care Physician: [name], MD  Primary Gastroenterologist:  Jonette Eva, MD   Chief Complaint  Patient presents with  . Dysphagia    burns when she eats anything    HPI: RAILEY GLAD is a 61 y.o. female here for burning in her throat with eating. Last seen in 07/2011 by Dr. Darrick Penna.   Patient c/o sorethroat/hoarseness/burning for three weeks.  No typical heartburn. Eating solid food makes it worse. Significant burning and pain with eating. Drinking okay. No dysphagia. Previously failed omeprazole once daily. Aciphex worked great, once daily but insurance would not pay. Lately taking lansoprazole 30mg , two in AM. Over past one week, BID instead. BM 3 times day, then may not go for three days. No melena, brbpr. No NSAIDS/ASA on regular basis.     Current Outpatient Prescriptions  Medication Sig Dispense Refill  . estradiol (ESTRACE) 1 MG tablet Take 1 mg by mouth daily.        . lansoprazole (PREVACID) 30 MG capsule Take 1 capsule (30 mg total) by mouth 2 (two) times daily.  31 capsule  11  . levothyroxine (SYNTHROID, LEVOTHROID) 88 MCG tablet Take 88 mcg by mouth daily.       Marland Kitchen DISCONTD: levothyroxine (SYNTHROID, LEVOTHROID) 75 MCG tablet Take 88 mcg by mouth daily.         Allergies as of 12/30/2011 - Review Complete 12/30/2011  Allergen Reaction Noted  . Diazepam     Past Medical History  Diagnosis Date  . IBS (irritable bowel syndrome) 2003  . Anxiety disorder   . Endometriosis   . Chronic pelvic pain in female   . Osteoporosis   . Helicobacter pylori gastritis     Dr. Juanda Chance   . Hiatal hernia 2003    on UGI   . GERD (gastroesophageal reflux disease)   . Hypothyroidism   . Obesity (BMI 30-39.9) DEC 2011 145 LBS  . PONV (postoperative nausea and vomiting)    Past Surgical History  Procedure Date  . S/p hysterectomy   . Laparoscopy   . Adhesiolysis     dense adhesion between rectum and sigmoid,  & pelvis   . Salpingoophorectomy   . Rt foot sx     . Colonoscopy 03: AP/D & 08: SCREENING    WNL'S  . Upper gastrointestinal endoscopy DEC 2010: DYSPEPSIA/DYSPHAGIA    SCH RING/ESO DIL 16 MM, GASTRITIS/DUODENITIS 2o to Aleve/on OMP PRN  . Colonoscopy 07/28/2011    sessile polyp in ascending colon/internal hemorrhoids   Family History  Problem Relation Age of Onset  . Prostate cancer Father   . Colon cancer Father     < 60 YO  . Cancer Father     prostate and colon  . Pancreatic cancer Mother   . Cancer Mother     pancreas  . Diabetes Mother   . Diabetes Brother   . Colon polyps Neg Hx    History   Social History  . Marital Status: Divorced    Spouse Name: N/A    Number of Children: N/A  . Years of Education: N/A   Occupational History  . fulltime at [employer]     Social History Main Topics  . Smoking status: Never Smoker   . Smokeless tobacco: None  . Alcohol Use: No     occasional  . Drug Use: No  . Sexually Active: None   Other Topics Concern  . None   Social History Narrative  . None  ROS:  General: Negative for anorexia, weight loss, fever, chills, fatigue, weakness. ENT: see hpi. No nasal congestion. CV: Negative for chest pain, angina, palpitations, dyspnea on exertion, peripheral edema.  Respiratory: Negative for dyspnea at rest, dyspnea on exertion, cough, sputum, wheezing.  GI: See history of present illness. GU:  Negative for dysuria, hematuria, urinary incontinence, urinary frequency, nocturnal urination.  Endo: Negative for unusual weight change.    Physical Examination:   BP 125/83  Pulse 72  Temp 97.3 F (36.3 C) (Temporal)  Ht 4' 10.5" (1.486 m)  Wt 139 lb 6.4 oz (63.231 kg)  BMI 28.64 kg/m2  General: Well-nourished, well-developed in no acute distress.  Eyes: No icterus. Mouth: Oropharyngeal mucosa moist and pink , no lesions erythema or exudate. Lungs: Clear to auscultation bilaterally.  Heart: Regular rate and rhythm, no murmurs rubs or gallops.  Abdomen: Bowel  sounds are normal, nontender, nondistended, no hepatosplenomegaly or masses, no abdominal bruits or hernia , no rebound or guarding.   Extremities: No lower extremity edema. No clubbing or deformities. Neuro: Alert and oriented x 4   Skin: Warm and dry, no jaundice.   Psych: Alert and cooperative, normal mood and affect.

## 2012-01-06 ENCOUNTER — Encounter (HOSPITAL_COMMUNITY): Payer: Self-pay | Admitting: Pharmacy Technician

## 2012-01-19 ENCOUNTER — Ambulatory Visit (HOSPITAL_COMMUNITY)
Admission: RE | Admit: 2012-01-19 | Discharge: 2012-01-19 | Disposition: A | Discharge status: Home / Self Care | Payer: BC Managed Care – PPO | Source: Ambulatory Visit | Attending: Gastroenterology | Admitting: Gastroenterology

## 2012-01-19 ENCOUNTER — Encounter (HOSPITAL_COMMUNITY): Payer: Self-pay | Admitting: *Deleted

## 2012-01-19 ENCOUNTER — Encounter (HOSPITAL_COMMUNITY)
Admission: RE | Disposition: A | Discharge status: Home / Self Care | Payer: Self-pay | Source: Ambulatory Visit | Attending: Gastroenterology

## 2012-01-19 DIAGNOSIS — K449 Diaphragmatic hernia without obstruction or gangrene: Secondary | ICD-10-CM

## 2012-01-19 DIAGNOSIS — K219 Gastro-esophageal reflux disease without esophagitis: Secondary | ICD-10-CM

## 2012-01-19 DIAGNOSIS — R1310 Dysphagia, unspecified: Secondary | ICD-10-CM | POA: Insufficient documentation

## 2012-01-19 DIAGNOSIS — R070 Pain in throat: Secondary | ICD-10-CM

## 2012-01-19 DIAGNOSIS — K2940 Chronic atrophic gastritis without bleeding: Secondary | ICD-10-CM | POA: Insufficient documentation

## 2012-01-19 DIAGNOSIS — K2970 Gastritis, unspecified, without bleeding: Secondary | ICD-10-CM

## 2012-01-19 DIAGNOSIS — K2990 Gastroduodenitis, unspecified, without bleeding: Secondary | ICD-10-CM

## 2012-01-19 DIAGNOSIS — K3189 Other diseases of stomach and duodenum: Secondary | ICD-10-CM | POA: Insufficient documentation

## 2012-01-19 HISTORY — PX: ESOPHAGOGASTRODUODENOSCOPY: SHX5428

## 2012-01-19 HISTORY — DX: Occlusion and stenosis of unspecified carotid artery: I65.29

## 2012-01-19 HISTORY — PX: BRAVO PH STUDY: SHX5421

## 2012-01-19 HISTORY — DX: Atherosclerotic heart disease of native coronary artery without angina pectoris: I25.10

## 2012-01-19 SURGERY — EGD (ESOPHAGOGASTRODUODENOSCOPY)
Anesthesia: Moderate Sedation

## 2012-01-19 MED ORDER — MEPERIDINE HCL 100 MG/ML IJ SOLN
INTRAMUSCULAR | Status: AC
  Filled 2012-01-19: qty 2

## 2012-01-19 MED ORDER — MEPERIDINE HCL 100 MG/ML IJ SOLN
INTRAMUSCULAR | Status: DC | PRN
  Administered 2012-01-19: 50 mg via INTRAVENOUS

## 2012-01-19 MED ORDER — STERILE WATER FOR IRRIGATION IR SOLN
Status: DC | PRN
  Administered 2012-01-19: 10:00:00

## 2012-01-19 MED ORDER — SODIUM CHLORIDE 0.45 % IV SOLN
Freq: Once | INTRAVENOUS | Status: AC
  Administered 2012-01-19: 09:00:00 via INTRAVENOUS

## 2012-01-19 MED ORDER — MIDAZOLAM HCL 5 MG/5ML IJ SOLN
INTRAMUSCULAR | Status: AC
  Filled 2012-01-19: qty 10

## 2012-01-19 MED ORDER — MIDAZOLAM HCL 5 MG/5ML IJ SOLN
INTRAMUSCULAR | Status: DC | PRN
  Administered 2012-01-19: 2 mg via INTRAVENOUS
  Administered 2012-01-19: 1 mg via INTRAVENOUS

## 2012-01-19 NOTE — H&P (Signed)
Primary Care Physician:  [name], MD Primary Gastroenterologist:  Dr. Darrick Penna  Pre-Procedure History & Physical: HPI:  Kim Grimes is a 61 y.o. female here for DYSPEPSIA.  Past Medical History  Diagnosis Date  . IBS (irritable bowel syndrome) 2003  . Anxiety disorder   . Endometriosis   . Chronic pelvic pain in female   . Osteoporosis   . Helicobacter pylori gastritis     Dr. Juanda Chance   . Hiatal hernia 2003    on UGI   . GERD (gastroesophageal reflux disease)   . Hypothyroidism   . Obesity (BMI 30-39.9) DEC 2011 145 LBS  . PONV (postoperative nausea and vomiting)   . Coronary artery disease   . Carotid artery stenosis     Past Surgical History  Procedure Date  . S/p hysterectomy   . Laparoscopy   . Adhesiolysis     dense adhesion between rectum and sigmoid,  & pelvis   . Salpingoophorectomy   . Rt foot sx   . Colonoscopy 03: AP/D & 08: SCREENING    WNL'S  . Upper gastrointestinal endoscopy DEC 2010: DYSPEPSIA/DYSPHAGIA    SCH RING/ESO DIL 16 MM, GASTRITIS/DUODENITIS 2o to Aleve/on OMP PRN  . Colonoscopy 07/28/2011    sessile polyp in ascending colon/internal hemorrhoids    Prior to Admission medications   Medication Sig Start Date End Date Taking? Authorizing Provider  dexlansoprazole (DEXILANT) 60 MG capsule Take 1 capsule (60 mg total) by mouth daily. Take 30 mins before breakfast. 12/30/11 01/29/12  Tiffany Kocher, PA  estradiol (ESTRACE) 1 MG tablet Take 1 mg by mouth daily.      Historical Provider, MD  levothyroxine (SYNTHROID, LEVOTHROID) 88 MCG tablet Take 88 mcg by mouth daily.  11/29/11   Historical Provider, MD  PRESCRIPTION MEDICATION Apply 1 application topically daily as needed. A cream that consists of Amantadine, Diclofenac, Baclofen, Bupivacaine, Propylene. Used to help with leg pain.    Historical Provider, MD    Allergies as of 12/30/2011 - Review Complete 12/30/2011  Allergen Reaction Noted  . Diazepam      Family History  Problem  Relation Age of Onset  . Prostate cancer Father   . Colon cancer Father     < 60 YO  . Cancer Father     prostate and colon  . Pancreatic cancer Mother   . Cancer Mother     pancreas  . Diabetes Mother   . Diabetes Brother   . Colon polyps Neg Hx     History   Social History  . Marital Status: Divorced    Spouse Name: N/A    Number of Children: N/A  . Years of Education: N/A   Occupational History  . fulltime at [employer]     Social History Main Topics  . Smoking status: Never Smoker   . Smokeless tobacco: Not on file  . Alcohol Use: Yes     occasional, maybe once/year  . Drug Use: No  . Sexually Active: Not on file   Other Topics Concern  . Not on file   Social History Narrative  . No narrative on file    Review of Systems: See HPI, otherwise negative ROS   Physical Exam: BP 135/91  Temp 97.7 F (36.5 C) (Oral)  Resp 17  Ht 4' 10.5" (1.486 m)  Wt 139 lb (63.05 kg)  BMI 28.56 kg/m2  SpO2 97% General:   Alert,  pleasant and cooperative in NAD Head:  Normocephalic and  atraumatic. Neck:  Supple;  Lungs:  Clear throughout to auscultation.    Heart:  Regular rate and rhythm. Abdomen:  Soft, nontender and nondistended. Normal bowel sounds, without guarding, and without rebound.   Neurologic:  Alert and  oriented x4;  grossly normal neurologically.  Impression/Plan:     DYSPEPSIA  PLAN:  EGD TODAY

## 2012-01-19 NOTE — Op Note (Signed)
[facility] [address] [place], [place]  [address]  ENDOSCOPY PROCEDURE REPORT  PATIENT:  Kim, Grimes  MR#:  [id number] BIRTHDATE:  28-Feb-1951, 61 yrs. old  GENDER:  female  ENDOSCOPIST:  Jonette Eva, MD Referred by:  Kim Grimes, M.D.  PROCEDURE DATE:  01/19/2012 PROCEDURE:  EGD with biopsy, 43239 WITH BRAVO CAPSULE PLACEMET ASA CLASS: INDICATIONS:  BURNING THROAT PAIN MADE BETTER WITH DEXILANT  MEDICATIONS:   Demerol 50 mg IV, Versed 3 mg IV TOPICAL ANESTHETIC:  Cetacaine Spray  DESCRIPTION OF PROCEDURE:     Physical exam was performed. Informed consent was obtained from the patient after explaining the benefits, risks, and alternatives to the procedure.  The patient was connected to the monitor and placed in the left lateral position.  Continuous oxygen was provided by nasal cannula and IV medicine administered through an indwelling cannula.  After administration of sedation, the patient's esophagus was intubated and the EG-[id number] ([id number]) endoscope was advanced under direct visualization to the second portion of the duodenum.  The scope was removed slowly by carefully examining the color, texture, anatomy, and integrity of the mucosa on the way out.  The patient was recovered in endoscopy and discharged home in satisfactory condition. <<PROCEDUREIMAGES>>  NO BARRETT'S. GE JXN LOCATED 31 CM FROM THE TEETH. BRAVO CAPSULE PLACED 25-26 CM FROM THE TEETH. A 2-3 CM hiatal hernia was found. Mild gastritis was found & BIOPSIED VIA COLD FORCEPS. NL DUODENUM.  COMPLICATIONS:    None  ENDOSCOPIC IMPRESSION: 1) SMALL Hiatal hernia 2) Mild gastritis  RECOMMENDATIONS: AWAIT BIOPSY CONTINUE DEXILANT 48 HOUR BRAVO STUDY LOW FAT DIET LOSE WEIGHT OPV IN 4 MOS  REPEAT EXAM:  No  ______________________________ Jonette Eva, MD  CC:  n. eSIGNED:   Sandi Fields at 01/19/2012 03:44 PM  [name], [name], [phone]

## 2012-01-22 ENCOUNTER — Encounter (HOSPITAL_COMMUNITY): Payer: Self-pay | Admitting: Gastroenterology

## 2012-01-22 ENCOUNTER — Telehealth: Payer: Self-pay | Admitting: Gastroenterology

## 2012-01-22 NOTE — Telephone Encounter (Signed)
Called and informed pt. [facility] [place].

## 2012-01-22 NOTE — Telephone Encounter (Signed)
PLEASE CALL PT.  HER STOMACH BIOPSIES SHOW BEING STOMACH POLYPS. HER BRAVO STUDY SHOWS SHE HER DEXILANT SHUTS DOWN HER ACID PUMPS JUST FINE. SHE HAS NON-ULCER DYSPEPSIA. IN 48 HOURS SHE HAD 29 EPISODE OF REFLUX WHICH IS NOT SURPRISING IF SHE IS EATING CHEESES, LUNCH MEAT,BACON, AND HAMBURGER. SHE MAY ADD A MEDICINE AT BEDTIME TO HELP CONTROL HER BURNING THROAT PAIN: NORTRIPTYLINE 10 MG QHS FOR 7 DAYS THEN 2 PO QHS FOR 7 DAYS THEN 3 PO QHS, #57M [id number]. SHE SHOULD MODIFY HER DIET & LOSE WEIGHT. FOLLOW UP IN 4 MOS.

## 2012-01-22 NOTE — Brief Op Note (Signed)
01/19/2012  3:21 PM  PATIENT:  Kim Grimes  61 y.o. female  PRE-OPERATIVE DIAGNOSIS:  GERD, Dysphagia  POST-OPERATIVE DIAGNOSIS:  GE junction at 31, gastritis, hiatal hernia  PROCEDURE:  Procedure(s) (LRB): ESOPHAGOGASTRODUODENOSCOPY (EGD) (N/A) BRAVO PH STUDY ()  SURGEON:  Surgeon(s) and Role:    * West Bali, MD - Primary   POST-OPERATIVE DIAGNOSIS:  NON-ULCER DYSPEPSIA   PROCEDURE:  Procedure(s): BRAVO PH STUDY ON NEXIUM BID  SURGEON:  Surgeon(s): Arlyce Harman, MD  FINDINGS:  PT HAD RECORDER FOR 1 DAY AND 20 HOURS+   FRACTION Ph TOTAL:  DAY 1 0.9  DAY 2 0.4   # OF REFLUXES:    DAY 1 18 DAY 2 11   LONG REFLUX > 5:   DAY 1 0 DAY 2 0    LONGEST REFLUX:   DAY 1 2 DAY 2 1   REFLUX TABLE: UPRIGHT 24 EPISODES, SUPINE 5  DEMEESTER SCORE DAY 1:  3.6 (NL < 14.72)  DEMEESTER SCORE DAY 2:  1.8 SAP TABLE: ACID REFLUX ANALYSIS: 82.8 % SUPINE  DIAGNOSIS: NON-ULCER DYSPEPSIA  PLAN: 1. ADD IMIPRAMINE 10 MG QHS AND INCREASE TO 30 MG QHS. 2. CONTINUE DEXILANT. LOW FAT DIET. LOSE WEIGHT. 3. OPV IN NOV 2013-E:30 VISIT.

## 2012-01-22 NOTE — Telephone Encounter (Signed)
Recall made for 4 months

## 2012-02-10 ENCOUNTER — Telehealth: Payer: Self-pay | Admitting: Gastroenterology

## 2012-02-10 NOTE — Telephone Encounter (Signed)
Pt said her Dexilant would be $54.00  And she cannot afford. Please advise!

## 2012-02-10 NOTE — Telephone Encounter (Signed)
Pt called while nurse was at lunch. She doesn't remember name of medicine but it costs too much and she wants [facility] [place]. [phone] Please advise and call patient

## 2012-02-11 NOTE — Telephone Encounter (Signed)
If she does not have Medicare plan, you can give her a rebate card.   If she does have Medicare, then we can try generic omeprazole 20mg  BID before a meal. #60, 11 refills.

## 2012-02-11 NOTE — Telephone Encounter (Signed)
Pt is not on Medicare. She requested samples of Dexilant. I left 2 boxes at front for pick up, #10. I also left instant savings and rebate info.

## 2012-04-06 ENCOUNTER — Telehealth: Payer: Self-pay | Admitting: *Deleted

## 2012-04-06 NOTE — Telephone Encounter (Signed)
Kim Grimes called today. She is concerned about her losing her voice. She would like to know what she can do about this.

## 2012-04-06 NOTE — Telephone Encounter (Signed)
I called pt. She said she is still having problems with losing her voice. 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 By the end of the day, her voice is gone. She said she saw ENT doctor and also endocrinologist about thyroid and both said that it was coming from her reflux. She is taking Lansoprazole 30 mg bid. She could not afford the Dexilant. Please advise! ( she is aware that Dr. Darrick Penna is at the hospital and it will probably be tomorrow before I call her back).

## 2012-04-07 NOTE — Telephone Encounter (Signed)
Called and informed pt. Mailed the list copy to her.

## 2012-04-07 NOTE — Progress Notes (Signed)
Pt aware I am mailing  her an Psychologist, counselling Card.

## 2012-04-07 NOTE — Telephone Encounter (Signed)
PLEASE CALL PT.  IF SHE IS CONCERNED ABOUT CONTROLLING HER REFLUX,  SHE SHOULD      1. Control HER weight. SHE SHOULD LOSE 10LBS.   2. Eat smaller meals. 4 TO 6 MEALS A DAY.     3. Loosen your belt.    4. Eliminate heartburn triggers. Everyone has specific triggers.     5. AVOID Common triggers such as fatty or fried foods, spicy food, tomato sauce, carbonated beverages, alcohol, chocolate, mint, garlic, onion, caffeine and nicotine may make heartburn worse.      6. Don't lie down after a meal. Wait at least three to four hours after eating before going to bed, and don't lie down right after eating.      7. FIGURE OUT A WAY TO GET HER DEXILANT

## 2012-05-10 ENCOUNTER — Encounter: Payer: Self-pay | Admitting: Gastroenterology

## 2012-06-07 ENCOUNTER — Other Ambulatory Visit: Payer: Self-pay

## 2012-06-07 MED ORDER — DEXLANSOPRAZOLE 60 MG PO CPDR: 60.0000 mg | DELAYED_RELEASE_CAPSULE | Freq: Every day | ORAL | Status: DC

## 2012-07-21 ENCOUNTER — Encounter: Payer: Self-pay | Admitting: Gastroenterology

## 2012-07-22 ENCOUNTER — Ambulatory Visit (INDEPENDENT_AMBULATORY_CARE_PROVIDER_SITE_OTHER): Payer: BC Managed Care – PPO | Admitting: Gastroenterology

## 2012-07-22 ENCOUNTER — Encounter: Payer: Self-pay | Admitting: Gastroenterology

## 2012-07-22 VITALS — BP 127/78 | HR 76 | Temp 98.2°F | Ht 63.0 in | Wt 143.2 lb

## 2012-07-22 DIAGNOSIS — K219 Gastro-esophageal reflux disease without esophagitis: Secondary | ICD-10-CM

## 2012-07-22 DIAGNOSIS — K3189 Other diseases of stomach and duodenum: Secondary | ICD-10-CM

## 2012-07-22 DIAGNOSIS — K589 Irritable bowel syndrome without diarrhea: Secondary | ICD-10-CM

## 2012-07-22 MED ORDER — RABEPRAZOLE SODIUM 20 MG PO TBEC: 20.0000 mg | DELAYED_RELEASE_TABLET | Freq: Every day | ORAL | Status: DC

## 2012-07-22 MED ORDER — NORTRIPTYLINE HCL 10 MG PO CAPS: ORAL_CAPSULE | ORAL | Status: DC

## 2012-07-22 NOTE — Progress Notes (Signed)
Faxed to PCP

## 2012-07-22 NOTE — Progress Notes (Signed)
Subjective:    Patient ID: Kim Grimes, female    DOB: 1951-04-02, 62 y.o.   MRN: [id number]  PCP: Lodema Hong  HPI STARTED DEXILANT. IT HELPS. DIDN'T PICK UP NORTRIPTYLINE. CAN GET ACIPHEX CHEAPER THAN ACIPHEX. SUN WAS IN PAIN IN LEFT SIDE OF ABD. GOT BETTER ON ITS OWN. STILL TENDER TO THE TOUCH. HAPPENED AFTER SHE DID NOT GO TO HAVE A BM. MAY HAPPEN IF SHE EATS THE WRONG THING. WALKING 2 MILES A DAY. CHANGES ROUTE IN CASE SOMEONE IS WATCHING HER.  ORIGINALLY FROM [place], [place].  Past Medical History  Diagnosis Date  . IBS (irritable bowel syndrome) 2003  . Anxiety disorder   . Endometriosis   . Chronic pelvic pain in female   . Osteoporosis   . Helicobacter pylori gastritis     Dr. Juanda Chance   . Hiatal hernia 2003    on UGI   . GERD (gastroesophageal reflux disease)   . Hypothyroidism   . Obesity (BMI 30-39.9) DEC 2011 145 LBS  . PONV (postoperative nausea and vomiting)   . Coronary artery disease   . Carotid artery stenosis     Past Surgical History  Procedure Date  . S/p hysterectomy   . Laparoscopy   . Adhesiolysis     dense adhesion between rectum and sigmoid,  & pelvis   . Salpingoophorectomy   . Rt foot sx   . Colonoscopy 03: AP/D & 08: SCREENING    WNL'S  . Upper gastrointestinal endoscopy DEC 2010: DYSPEPSIA/DYSPHAGIA    SCH RING/ESO DIL 16 MM, GASTRITIS/DUODENITIS 2o to Aleve/on OMP PRN  . Colonoscopy 07/28/2011    sessile polyp in ascending colon/internal hemorrhoids  . Esophagogastroduodenoscopy 01/19/2012    SLF: SMALL Hiatal hernia/Mild gastritis  . Bravo ph study 01/19/2012    Procedure: BRAVO PH STUDY;  Surgeon: West Bali, MD;  [facility]: [facility];  Service: Endoscopy;;   Allergies  Allergen Reactions  . Diazepam Other (See Comments)    Aggitation    Current Outpatient Prescriptions  Medication Sig Dispense Refill  . dexlansoprazole (DEXILANT) 60 MG capsule Take 1 capsule (60 mg total) by mouth daily. Take 30 mins before breakfast.    .  estradiol (ESTRACE) 1 MG tablet Take 1 mg by mouth daily.        Marland Kitchen levothyroxine (SYNTHROID, LEVOTHROID) 88 MCG tablet Take 88 mcg by mouth daily.       Marland Kitchen PRESCRIPTION MEDICATION Apply 1 application topically daily as needed. A cream that consists of Amantadine, Diclofenac, Baclofen, Bupivacaine, Propylene. Used to help with leg pain.          Review of Systems     Objective:   Physical Exam  Vitals reviewed. Constitutional: She is oriented to person, place, and time. She appears well-nourished. No distress.  HENT:  Head: Normocephalic and atraumatic.  Mouth/Throat: Oropharynx is clear and moist. No oropharyngeal exudate.  Eyes: Pupils are equal, round, and reactive to light. No scleral icterus.  Cardiovascular: Normal rate and normal heart sounds.   Pulmonary/Chest: Effort normal and breath sounds normal. No respiratory distress.  Abdominal: Soft. Bowel sounds are normal. She exhibits no distension. There is tenderness (MILD TTP IN LLQ). There is no rebound and no guarding.  Musculoskeletal: She exhibits no edema.  Neurological: She is alert and oriented to person, place, and time.       NO FOCAL DEFICITS   Psychiatric: She has a normal mood and affect.          Assessment &  Plan:

## 2012-07-22 NOTE — Progress Notes (Signed)
Reminder in epic to follow up with SF in E30 in 6 months °

## 2012-07-22 NOTE — Assessment & Plan Note (Signed)
SX FAIRLY WELL CONTROLLED.  ADD PAMELOR QHS. MED SIDE EFFECTS DISCUSSED. OPV IN 6 MOS.

## 2012-07-22 NOTE — Patient Instructions (Signed)
CONTINUE TO WALK.  CONTINUE ACIPHEX EVERY MORNING.  FOLLOW UP IN 6 MOS.  Irritable Bowel Syndrome Irritable Bowel Syndrome (IBS) is caused by a disturbance of normal bowel function. Other terms used are spastic colon, mucous colitis, and irritable colon. It does not require surgery, nor does it lead to cancer. There is no cure for IBS. But with proper diet, stress reduction, and medication, you will find that your problems (symptoms) will gradually disappear or improve. IBS is a common digestive disorder. It usually appears in late adolescence or early adulthood. Women develop it twice as often as men.  CAUSES  After food has been digested and absorbed in the small intestine, waste material is moved into the colon (large intestine). In the colon, water and salts are absorbed from the undigested products coming from the small intestine. The remaining residue, or fecal material, is held for elimination. Under normal circumstances, gentle, rhythmic contractions on the bowel walls push the fecal material along the colon towards the rectum. In IBS, however, these contractions are irregular and poorly coordinated. The fecal material is either retained too long, resulting in constipation, or expelled too soon, producing diarrhea.  SYMPTOMS  The most common symptom of IBS is pain. It is typically in the lower left side of the belly (abdomen). But it may occur anywhere in the abdomen. It can be felt as heartburn, backache, or even as a dull pain in the arms or shoulders. The pain comes from excessive bowel-muscle spasms and from the buildup of gas and fecal material in the colon. This pain:  Can range from sharp belly (abdominal) cramps to a dull, continuous ache.   Usually worsens soon after eating.   Is typically relieved by having a bowel movement or passing gas.  Abdominal pain is usually accompanied by constipation. But it may also produce diarrhea. The diarrhea typically occurs right after a meal or  upon arising in the morning. The stools are typically soft and watery. They are often flecked with secretions (mucus). Other symptoms of IBS include:  Bloating.   Loss of appetite.   Heartburn.   Feeling sick to your stomach (nausea).   Belching   Vomiting   Gas.  IBS may also cause a number of symptoms that are unrelated to the digestive system:  Fatigue.   Headaches.   Anxiety   Shortness of breath   Difficulty in concentrating.   Dizziness.  These symptoms tend to come and go.  DIAGNOSIS  The symptoms of IBS closely mimic the symptoms of other, more serious digestive disorders. So your caregiver may wish to perform a variety of additional tests to exclude these disorders. He/she wants to be certain of learning what is wrong (diagnosis). The nature and purpose of each test will be explained to you.  TREATMENT A number of medications are available to help correct bowel function and/or relieve bowel spasms and abdominal pain. Among the drugs available are:  Mild, non-irritating laxatives for severe constipation and to help restore normal bowel habits.   Specific anti-diarrheal medications to treat severe or prolonged diarrhea.   Anti-spasmodic agents to relieve intestinal cramps.   Your caregiver may also decide to treat you with a mild tranquilizer or sedative during unusually stressful periods in your life.  The important thing to remember is that if any drug is prescribed for you, make sure that you take it exactly as directed. Make sure that your caregiver knows how well it worked for you.  HOME CARE INSTRUCTIONS  Avoid foods that are high in fat or oils. Some examples AVW:UJWJX cream, butter, frankfurters, sausage, and other fatty meats.   Avoid foods that have a laxative effect, such as fruit, fruit juice, and dairy products.   Cut out carbonated drinks, chewing gum, and "gassy" foods, such as beans and cabbage. This may help relieve bloating and belching.     Bran taken with plenty of liquids may help relieve constipation.   Keep track of what foods seem to trigger your symptoms.   Avoid emotionally charged situations or circumstances that produce anxiety.   Start or continue exercising.   Get plenty of rest and sleep.

## 2012-07-22 NOTE — Assessment & Plan Note (Signed)
DUE TO NON-ULCER DYSPEPSIA  ADD PAMELOR QHS. MED WARNING GIVEN. OPV IN 6 MOS.

## 2012-07-22 NOTE — Assessment & Plan Note (Signed)
SX CONTROLLED WITH DEXILANT. ACIPHEX WORKED IN THE PAST AND IS CHEAPER FOR HER.  STOP DEXILANT. ACIPHEX EVERY AM. OPV IN 6 MOS

## 2012-08-19 ENCOUNTER — Other Ambulatory Visit: Payer: Self-pay | Admitting: Obstetrics and Gynecology

## 2012-08-19 DIAGNOSIS — Z1231 Encounter for screening mammogram for malignant neoplasm of breast: Secondary | ICD-10-CM

## 2012-09-17 ENCOUNTER — Ambulatory Visit (INDEPENDENT_AMBULATORY_CARE_PROVIDER_SITE_OTHER): Payer: BC Managed Care – PPO | Admitting: Family Medicine

## 2012-09-17 ENCOUNTER — Encounter: Payer: Self-pay | Admitting: Family Medicine

## 2012-09-17 ENCOUNTER — Ambulatory Visit
Admission: RE | Admit: 2012-09-17 | Discharge: 2012-09-17 | Disposition: A | Discharge status: Home / Self Care | Payer: BC Managed Care – PPO | Source: Ambulatory Visit | Attending: Obstetrics and Gynecology | Admitting: Obstetrics and Gynecology

## 2012-09-17 VITALS — BP 104/76 | HR 88 | Resp 18 | Ht 59.5 in | Wt 140.0 lb

## 2012-09-17 DIAGNOSIS — Z13220 Encounter for screening for lipoid disorders: Secondary | ICD-10-CM

## 2012-09-17 DIAGNOSIS — J387 Other diseases of larynx: Secondary | ICD-10-CM

## 2012-09-17 DIAGNOSIS — J0110 Acute frontal sinusitis, unspecified: Secondary | ICD-10-CM

## 2012-09-17 DIAGNOSIS — Z1231 Encounter for screening mammogram for malignant neoplasm of breast: Secondary | ICD-10-CM

## 2012-09-17 DIAGNOSIS — F411 Generalized anxiety disorder: Secondary | ICD-10-CM

## 2012-09-17 DIAGNOSIS — E039 Hypothyroidism, unspecified: Secondary | ICD-10-CM

## 2012-09-17 DIAGNOSIS — J309 Allergic rhinitis, unspecified: Secondary | ICD-10-CM | POA: Insufficient documentation

## 2012-09-17 DIAGNOSIS — E559 Vitamin D deficiency, unspecified: Secondary | ICD-10-CM

## 2012-09-17 DIAGNOSIS — K219 Gastro-esophageal reflux disease without esophagitis: Secondary | ICD-10-CM

## 2012-09-17 MED ORDER — ERGOCALCIFEROL 1.25 MG (50000 UT) PO CAPS: 50000.0000 [IU] | ORAL_CAPSULE | ORAL | Status: DC

## 2012-09-17 MED ORDER — AZITHROMYCIN 250 MG PO TABS: ORAL_TABLET | ORAL | Status: AC

## 2012-09-17 NOTE — Patient Instructions (Addendum)
F/u in 6 month, please call if you need me before.  One multivitamin eg centrum is recommended once daily also  You are being treated for acute frontal sinusitis, antibiotics are prescribed for 5 days.  You are having uncontrolled allergies, start zyrtec 1 daily, and for the next 5 days take 1 sudafed tablet once daily to reduce rigth ear pressure and excess drainage.  You are vit D deficient, take vit D one weekly, sent to your pharmacy  You need calcium with D supplement . I recommend OTC gel capsule calcium1200mg  /1000IU Vit D once daily    It is important that you exercise regularly at least 30 minutes 5 times a week. If you develop chest pain, have severe difficulty breathing, or feel very tired, stop exercising immediately and seek medical attention    Fasting lipid, bloog glucose and vit D in 6 month

## 2012-09-17 NOTE — Progress Notes (Signed)
  Subjective:    Patient ID: Kim Grimes, female    DOB: 1950/09/26, 62 y.o.   MRN: [id number]  HPI Right ear pressure and excessive coughing and choking this week.Does have intermittent nasal stuffiness and drainage, has ha chills, but no documented fever On reflux meds daily, states that since on acipphex reflux symptoms are controlled well.  Has been to ENT last year for hoarseness,reports normal exam, review of record shows conclusion that intermittent hoarseness not due to laryngeal pathology , but gERD   Review of Systems See HPI  Denies chest pains, palpitations and leg swelling Denies abdominal pain, nausea, vomiting,diarrhea or constipation.   Denies dysuria, frequency, hesitancy or incontinence. Denies joint pain, swelling and limitation in mobility. Denies headaches, seizures, numbness, or tingling. Denies depression, anxiety or insomnia. Denies skin break down or rash.        Objective:   Physical Exam Patient alert and oriented and in no cardiopulmonary distress.  HEENT: No facial asymmetry, EOMI, frontal  sinus tenderness,  oropharynx pink and moist.  Neck supple left anterior cervical  Adenopathy.TM clear bilaterally  Chest: Clear to auscultation bilaterally.  CVS: S1, S2 no murmurs, no S3.  ABD: Soft non tender. Bowel sounds normal.  Ext: No edema  MS: Adequate ROM spine, shoulders, hips and knees.  Skin: Intact, no ulcerations or rash noted.  Psych: Good eye contact, normal affect. Memory intact not anxious or depressed appearing.  CNS: CN 2-12 intact, power, tone and sensation normal throughout.        Assessment & Plan:

## 2012-09-18 NOTE — Assessment & Plan Note (Signed)
Acute infection, zpack prescribed

## 2012-09-18 NOTE — Assessment & Plan Note (Signed)
Importance of supplementation to correct stressed and med prescribed. Pt to also start calcium with D daily, unsure why already not on this

## 2012-09-18 NOTE — Assessment & Plan Note (Signed)
Improved control on aciphx which pt will continue

## 2012-09-18 NOTE — Assessment & Plan Note (Signed)
Established dx by ENT. Most recent laryngoscopy was 1 year ago

## 2012-09-18 NOTE — Assessment & Plan Note (Signed)
Managed by endo with supplemental med

## 2012-09-18 NOTE — Assessment & Plan Note (Signed)
Reports use of estrogen as needed for anxiety, prescribed by gyne. Data on recommendation to d/c ERT after 5 yrs presented to pt

## 2012-09-22 ENCOUNTER — Ambulatory Visit: Payer: BC Managed Care – PPO

## 2012-10-25 ENCOUNTER — Other Ambulatory Visit: Payer: Self-pay

## 2012-10-25 MED ORDER — RABEPRAZOLE SODIUM 20 MG PO TBEC: DELAYED_RELEASE_TABLET | ORAL | Status: DC

## 2012-12-09 ENCOUNTER — Ambulatory Visit (INDEPENDENT_AMBULATORY_CARE_PROVIDER_SITE_OTHER): Payer: BC Managed Care – PPO | Admitting: Family Medicine

## 2012-12-09 ENCOUNTER — Encounter: Payer: Self-pay | Admitting: Family Medicine

## 2012-12-09 VITALS — BP 104/72 | HR 78 | Temp 98.4°F | Resp 18 | Ht 59.5 in | Wt 139.0 lb

## 2012-12-09 DIAGNOSIS — E559 Vitamin D deficiency, unspecified: Secondary | ICD-10-CM

## 2012-12-09 DIAGNOSIS — K219 Gastro-esophageal reflux disease without esophagitis: Secondary | ICD-10-CM

## 2012-12-09 DIAGNOSIS — J209 Acute bronchitis, unspecified: Secondary | ICD-10-CM

## 2012-12-09 DIAGNOSIS — J309 Allergic rhinitis, unspecified: Secondary | ICD-10-CM

## 2012-12-09 MED ORDER — AZITHROMYCIN 250 MG PO TABS: ORAL_TABLET | ORAL | Status: AC

## 2012-12-09 MED ORDER — PSEUDOEPHEDRINE HCL 30 MG PO TABS: ORAL_TABLET | ORAL | Status: DC

## 2012-12-09 MED ORDER — LORATADINE 10 MG PO TABS: 10.0000 mg | ORAL_TABLET | Freq: Every day | ORAL | Status: DC

## 2012-12-09 MED ORDER — AZELASTINE HCL 0.1 % NA SOLN: 2.0000 | Freq: Two times a day (BID) | NASAL | Status: DC

## 2012-12-09 NOTE — Progress Notes (Signed)
  Subjective:    Patient ID: Kim Grimes, female    DOB: 12-21-1950, 62 y.o.   MRN: [id number]  HPI 2 week h/o head and chest congestion with chills, loss of voice, worsening, no documented fever, sleep disturbed due to symptoms.Also has had loss of voice with the symptoms.States sputum when produced is thick Denies symptoms of uncontrolled reflux at this time, and is followed by GI.   Review of Systems See HPI Denies chest pains, palpitations and leg swelling Denies abdominal pain, nausea, vomiting,diarrhea or constipation.   Denies dysuria, frequency, hesitancy or incontinence. Denies joint pain, swelling and limitation in mobility. Denies headaches, seizures, numbness, or tingling. Denies depression, anxiety or insomnia. Denies skin break down or rash.        Objective:   Physical Exam Patient alert and oriented and in no cardiopulmonary distress.  HEENT: No facial asymmetry, EOMI, maxillary  sinus tenderness,  oropharynx pink and moist.  Neck supple no adenopathy.TM clear  Chest: adequate air entry throughout few basilar crackles, no wheezes  CVS: S1, S2 no murmurs, no S3.  ABD: Soft non tender. Bowel sounds normal.  Ext: No edema  MS: Adequate ROM spine, shoulders, hips and knees.  Skin: Intact, no ulcerations or rash noted.  Psych: Good eye contact, normal affect. Memory intact not anxious or depressed appearing.  CNS: CN 2-12 intact, power, tone and sensation normal throughout.        Assessment & Plan:

## 2012-12-09 NOTE — Patient Instructions (Addendum)
F/u in 5 month, call if you need me before, pls cancel any sooner appt.  You are being treated for uncontrolled allergie and bronchitis, loratidine, sudafed, z pack and astelin are prescribed as we discussed If cough  Persists and allergy symptoms improve, please call for referral to GI for uncontrolled reflux

## 2012-12-11 NOTE — Assessment & Plan Note (Signed)
Controlled and followed by GI 

## 2012-12-11 NOTE — Assessment & Plan Note (Signed)
Uncontrolled , daily use of medication discussed and encouraged

## 2012-12-11 NOTE — Assessment & Plan Note (Signed)
antibiotic prescribed, pt to call if no improvement

## 2012-12-11 NOTE — Assessment & Plan Note (Signed)
Continue weekly supplement for 6 month total then re eval

## 2012-12-11 NOTE — Assessment & Plan Note (Addendum)
Uncontrolled, aggressive use of allergy medication discusssed and is to be started

## 2013-01-20 ENCOUNTER — Telehealth: Payer: Self-pay

## 2013-01-20 ENCOUNTER — Encounter: Payer: Self-pay | Admitting: Gastroenterology

## 2013-01-20 ENCOUNTER — Ambulatory Visit (INDEPENDENT_AMBULATORY_CARE_PROVIDER_SITE_OTHER): Payer: BC Managed Care – PPO | Admitting: Gastroenterology

## 2013-01-20 VITALS — BP 108/72 | HR 74 | Temp 98.0°F | Ht <= 58 in | Wt 142.6 lb

## 2013-01-20 DIAGNOSIS — K589 Irritable bowel syndrome without diarrhea: Secondary | ICD-10-CM

## 2013-01-20 DIAGNOSIS — E039 Hypothyroidism, unspecified: Secondary | ICD-10-CM

## 2013-01-20 DIAGNOSIS — Z13220 Encounter for screening for lipoid disorders: Secondary | ICD-10-CM

## 2013-01-20 DIAGNOSIS — K219 Gastro-esophageal reflux disease without esophagitis: Secondary | ICD-10-CM

## 2013-01-20 NOTE — Progress Notes (Signed)
Subjective:    Patient ID: Kim Grimes, female    DOB: 1950-07-25, 62 y.o.   MRN: [id number]  Kim Overman, MD  HPI Two things: sour stomach (AFTER BM GETS FUNNY TASTE IN HER MOUTH). PROBLEMS WITH LOSING VOICE AND TOLD IT WAS DUE TO HER ACID REFLUX. DOESN'T HAPPEN THAT OFTEN. TOLD HER TO KEEP TAKING HER REFLUX MEDS. STILL TAKING ACIPHEX, BUT IF SHE MISSES A PILL IT TAKES HER WEEK TO GET BACK IN SHAPE. CAN'T TAKE PAMELOR BECAUSE IT MAKES HER TOO SLEEPY. BUT WILL TAKE IT IF SHE HAS A BAD SPELL WITH HER STOMACH. MAY GET A GAS POCKET IN LLQ/FLANK.  Past Medical History  Diagnosis Date  . IBS (irritable bowel syndrome) 2003  . Anxiety disorder   . Endometriosis   . Chronic pelvic pain in female   . Osteoporosis   . Helicobacter pylori gastritis     Dr. Juanda Chance   . Hiatal hernia 2003    on UGI   . GERD (gastroesophageal reflux disease)   . Hypothyroidism   . Obesity (BMI 30-39.9) DEC 2011 145 LBS  . PONV (postoperative nausea and vomiting)   . Coronary artery disease   . Carotid artery stenosis    Past Surgical History  Procedure Laterality Date  . S/p hysterectomy    . Laparoscopy    . Adhesiolysis      dense adhesion between rectum and sigmoid,  & pelvis   . Salpingoophorectomy    . Rt foot sx    . Colonoscopy  03: AP/D & 08: SCREENING    WNL'S  . Upper gastrointestinal endoscopy  DEC 2010: DYSPEPSIA/DYSPHAGIA    SCH RING/ESO DIL 16 MM, GASTRITIS/DUODENITIS 2o to Aleve/on OMP PRN  . Colonoscopy  07/28/2011    sessile polyp in ascending colon/internal hemorrhoids  . Esophagogastroduodenoscopy  01/19/2012    SLF: SMALL Hiatal hernia/Mild gastritis  . Bravo ph study  01/19/2012    Procedure: BRAVO PH STUDY;  Surgeon: West Bali, MD;  Location: [facility];  Service: Endoscopy;;   Allergies  Allergen Reactions  . Diazepam Other (See Comments)    Aggitation   Current Outpatient Prescriptions  Medication Sig Dispense Refill  . ergocalciferol (VITAMIN D2) 50000  UNITS capsule Take 1 capsule (50,000 Units total) by mouth once a week. One capsule once weekly    . estradiol (ESTRACE) 1 MG tablet Take 1 mg by mouth daily.      Marland Kitchen levothyroxine (SYNTHROID, LEVOTHROID) 100 MCG tablet Take 100 mcg by mouth daily.    Marland Kitchen PRESCRIPTION MEDICATION Apply 1 application topically daily as needed. A cream that consists of Amantadine, Diclofenac, Baclofen, Bupivacaine, Propylene. Used to help with leg pain.    . pseudoephedrine (SUDAFED) 30 MG tablet One tablet once daily, as needed, for uncontrolled drainage and head congestion    . RABEprazole (ACIPHEX) 20 MG tablet 1 PO EVERY MORNING WITH BREAKFAST    . azelastine (ASTELIN) 137 MCG/SPRAY nasal spray Place 2 sprays into the nose 2 (two) times daily. Use in each nostril as directed    . loratadine (CLARITIN) 10 MG tablet Take 1 tablet (10 mg total) by mouth daily.          Review of Systems     Objective:   Physical Exam  Vitals reviewed. Constitutional: She is oriented to person, place, and time. She appears well-nourished. No distress.  HENT:  Head: Normocephalic and atraumatic.  Mouth/Throat: Oropharynx is clear and moist. No oropharyngeal exudate.  Eyes: Pupils  are equal, round, and reactive to light. No scleral icterus.  Neck: Normal range of motion. Neck supple.  Cardiovascular: Normal rate, regular rhythm and normal heart sounds.   Pulmonary/Chest: Effort normal and breath sounds normal. No respiratory distress.  Abdominal: Soft. Bowel sounds are normal. She exhibits no distension. There is tenderness. There is no rebound and no guarding.  MILD LUQ TTP. MILD TTP IN THE EPIGASTRIUM    Musculoskeletal: She exhibits no edema.  Lymphadenopathy:    She has no cervical adenopathy.  Neurological: She is alert and oriented to person, place, and time.  NO FOCAL DEFICITS   Psychiatric: She has a normal mood and affect.          Assessment & Plan:

## 2013-01-20 NOTE — Patient Instructions (Signed)
FOLLOW A LOW FAT DIET. SEE INFO BELOW.  CONTINUE ACIPHEX.  CONTINUE YOUR WEIGHT LOSS EFFORTS.  FOLLOW UP IN 6 MOS.

## 2013-01-20 NOTE — Progress Notes (Signed)
CC PCP 

## 2013-01-20 NOTE — Assessment & Plan Note (Signed)
SX FAIRLY WELL CONTROLLED.  ACIPHEX QAM LOW FAT DIET CONTINUE YOUR WEIGHT LOSS EFFORTS. OPV IN 6 MOS

## 2013-01-20 NOTE — Telephone Encounter (Signed)
Appropriate labs ordered: lipid, cbc, cmp, tsh Patient aware that form will be completed once results are received

## 2013-01-20 NOTE — Assessment & Plan Note (Signed)
SX FAIRLY WELL CONTROLLED. UNABLE TO TOLERATE PAMELOR QHS.  PAMELOR PRN. DIET MODIFICATION OPV IN 6 MOS

## 2013-01-22 LAB — CBC
HCT: 39.4 % (ref 36.0–46.0)
Hemoglobin: 12.9 g/dL (ref 12.0–15.0)
MCH: 28.4 pg (ref 26.0–34.0)
MCHC: 32.7 g/dL (ref 30.0–36.0)
MCV: 86.8 fL (ref 78.0–100.0)
Platelets: 236 10*3/uL (ref 150–400)
RBC: 4.54 MIL/uL (ref 3.87–5.11)
RDW: 13.7 % (ref 11.5–15.5)
WBC: 4.3 10*3/uL (ref 4.0–10.5)

## 2013-01-22 LAB — LIPID PANEL
Cholesterol: 185 mg/dL (ref 0–200)
HDL: 54 mg/dL (ref >39)
LDL Cholesterol: 117 mg/dL — ABNORMAL HIGH (ref 0–99)
Total CHOL/HDL Ratio: 3.4 Ratio
Triglycerides: 69 mg/dL (ref <150)
VLDL: 14 mg/dL (ref 0–40)

## 2013-01-22 LAB — TSH: TSH: 0.333 u[IU]/mL — ABNORMAL LOW (ref 0.350–4.500)

## 2013-01-22 LAB — BASIC METABOLIC PANEL
BUN: 18 mg/dL (ref 6–23)
CO2: 26 mEq/L (ref 19–32)
Calcium: 9.1 mg/dL (ref 8.4–10.5)
Chloride: 106 mEq/L (ref 96–112)
Creat: 0.92 mg/dL (ref 0.50–1.10)
Glucose, Bld: 82 mg/dL (ref 70–99)
Potassium: 4.5 mEq/L (ref 3.5–5.3)
Sodium: 140 mEq/L (ref 135–145)

## 2013-01-28 NOTE — Progress Notes (Signed)
Reminder in epic °

## 2013-03-25 ENCOUNTER — Ambulatory Visit: Payer: BC Managed Care – PPO | Admitting: Family Medicine

## 2013-04-13 ENCOUNTER — Encounter: Payer: Self-pay | Admitting: Family Medicine

## 2013-04-13 ENCOUNTER — Ambulatory Visit (INDEPENDENT_AMBULATORY_CARE_PROVIDER_SITE_OTHER): Payer: BC Managed Care – PPO | Admitting: Family Medicine

## 2013-04-13 VITALS — BP 112/68 | HR 76 | Resp 18 | Ht 59.75 in | Wt 144.1 lb

## 2013-04-13 DIAGNOSIS — R232 Flushing: Secondary | ICD-10-CM

## 2013-04-13 DIAGNOSIS — R0989 Other specified symptoms and signs involving the circulatory and respiratory systems: Secondary | ICD-10-CM

## 2013-04-13 DIAGNOSIS — E559 Vitamin D deficiency, unspecified: Secondary | ICD-10-CM

## 2013-04-13 DIAGNOSIS — Z23 Encounter for immunization: Secondary | ICD-10-CM

## 2013-04-13 DIAGNOSIS — Z13220 Encounter for screening for lipoid disorders: Secondary | ICD-10-CM

## 2013-04-13 DIAGNOSIS — K219 Gastro-esophageal reflux disease without esophagitis: Secondary | ICD-10-CM

## 2013-04-13 DIAGNOSIS — Z130 Encounter for screening for diseases of the blood and blood-forming organs and certain disorders involving the immune mechanism: Secondary | ICD-10-CM

## 2013-04-13 DIAGNOSIS — F411 Generalized anxiety disorder: Secondary | ICD-10-CM

## 2013-04-13 DIAGNOSIS — N951 Menopausal and female climacteric states: Secondary | ICD-10-CM

## 2013-04-13 DIAGNOSIS — Z1321 Encounter for screening for nutritional disorder: Secondary | ICD-10-CM

## 2013-04-13 MED ORDER — VENLAFAXINE HCL ER 37.5 MG PO CP24: 37.5000 mg | ORAL_CAPSULE | Freq: Every day | ORAL | Status: DC

## 2013-04-13 NOTE — Progress Notes (Signed)
  Subjective:    Patient ID: Kim Grimes, female    DOB: [birth year], 62 y.o.   MRN: [id number]  HPI The PT is here for follow up and re-evaluation of chronic medical conditions, medication management and review of any available recent lab and radiology data.  Preventive health is updated, specifically  Cancer screening and Immunization.  Requests flu vaccine today Questions or concerns regarding consultations or procedures which the PT has had in the interim are  Addressed.Continues to follow regularly with GI and also endocrine and gyne. C/o intermittent hoarseness, has seen ENT in the past, and she noted that tends to flare when reflux is less well controled The PT denies any adverse reactions to current medications since the last visit.  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 C/o worsening hot flashes , with disturbed sleep, maintained on HRT for years  y gyne and her thyroid is followed by endo  States willing to try additional drug for help, due to poor quality of life  Specifically asks about abnormal vascular study last year, needs to be reviewed by vascular surgeon, and will refer      Review of Systems See HPI Denies recent fever or chills. Denies sinus pressure, nasal congestion, ear pain or sore throat. Denies chest congestion, productive cough or wheezing. Denies chest pains, palpitations and leg swelling Denies abdominal pain, nausea, vomiting,diarrhea or constipation.   Denies dysuria, frequency, hesitancy or incontinence. Denies joint pain, swelling and limitation in mobility. Denies headaches, seizures, numbness, or tingling. . Denies skin break down or rash.        Objective:   Physical Exam  Patient alert and oriented and in no cardiopulmonary distress.  HEENT: No facial asymmetry, EOMI, no sinus tenderness,  oropharynx pink and moist.   Neck supple no adenopathy.Carotid bruit  Chest: Clear to auscultation bilaterally.  CVS: S1, S2 no murmurs, no S3.  ABD: Soft non tender. Bowel sounds normal.  Ext: No edema  MS: Adequate ROM spine, shoulders, hips and knees.  Skin: Intact, no ulcerations or rash noted.  Psych: Good eye contact, normal affect. Memory intact not anxious or depressed appearing.  CNS: CN 2-12 intact, power, tone and sensation normal throughout.       Assessment & Plan:

## 2013-04-13 NOTE — Patient Instructions (Signed)
F/u in 4.5 month, call if you need me before  Flu vaccine today  New for "hot flashes at bedtime is effexor as we discussed, this is sent to your pharmacy   Fasting lipid and vit D in 4.5 month

## 2013-04-14 ENCOUNTER — Telehealth: Payer: Self-pay | Admitting: Family Medicine

## 2013-04-14 NOTE — Telephone Encounter (Signed)
STATED THAT YOU WERE GOING TO SEND HER TO VASCULAR ON HER NECK

## 2013-04-15 ENCOUNTER — Other Ambulatory Visit: Payer: Self-pay | Admitting: Family Medicine

## 2013-04-15 DIAGNOSIS — I739 Peripheral vascular disease, unspecified: Secondary | ICD-10-CM

## 2013-04-15 NOTE — Telephone Encounter (Signed)
Referral has been entered, psl fax the carotid study and MRA from 2013 and follow through on appointment , thanks

## 2013-04-17 NOTE — Assessment & Plan Note (Signed)
Controlled and followed by GI 

## 2013-04-17 NOTE — Assessment & Plan Note (Signed)
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

## 2013-04-17 NOTE — Assessment & Plan Note (Signed)
Progressively worsening, disturbs sleep. Will try effexor, has been on ERT from gyne for years

## 2013-04-17 NOTE — Assessment & Plan Note (Signed)
Daily vit D supplement recommended

## 2013-04-17 NOTE — Assessment & Plan Note (Signed)
Uncontrolled, poor sleep and "stress at work" are both contributing factors. Hopefully pt will comply with effexor and get some relief

## 2013-04-20 ENCOUNTER — Other Ambulatory Visit: Payer: Self-pay | Admitting: *Deleted

## 2013-04-20 DIAGNOSIS — I739 Peripheral vascular disease, unspecified: Secondary | ICD-10-CM

## 2013-04-20 DIAGNOSIS — L98499 Non-pressure chronic ulcer of skin of other sites with unspecified severity: Secondary | ICD-10-CM

## 2013-05-13 ENCOUNTER — Encounter: Payer: Self-pay | Admitting: Surgery

## 2013-05-16 ENCOUNTER — Ambulatory Visit (INDEPENDENT_AMBULATORY_CARE_PROVIDER_SITE_OTHER): Payer: BC Managed Care – PPO | Admitting: Surgery

## 2013-05-16 ENCOUNTER — Ambulatory Visit (HOSPITAL_COMMUNITY)
Admission: RE | Admit: 2013-05-16 | Discharge: 2013-05-16 | Disposition: A | Discharge status: Home / Self Care | Payer: BC Managed Care – PPO | Source: Ambulatory Visit | Attending: Surgery | Admitting: Surgery

## 2013-05-16 ENCOUNTER — Encounter: Payer: Self-pay | Admitting: *Deleted

## 2013-05-16 ENCOUNTER — Encounter: Payer: Self-pay | Admitting: Family Medicine

## 2013-05-16 ENCOUNTER — Encounter (INDEPENDENT_AMBULATORY_CARE_PROVIDER_SITE_OTHER): Payer: Self-pay

## 2013-05-16 ENCOUNTER — Encounter: Payer: Self-pay | Admitting: Surgery

## 2013-05-16 DIAGNOSIS — Z01810 Encounter for preprocedural cardiovascular examination: Secondary | ICD-10-CM

## 2013-05-16 DIAGNOSIS — H93A3 Pulsatile tinnitus, bilateral: Secondary | ICD-10-CM

## 2013-05-16 DIAGNOSIS — I6529 Occlusion and stenosis of unspecified carotid artery: Secondary | ICD-10-CM

## 2013-05-16 DIAGNOSIS — L98499 Non-pressure chronic ulcer of skin of other sites with unspecified severity: Secondary | ICD-10-CM | POA: Insufficient documentation

## 2013-05-16 DIAGNOSIS — H9319 Tinnitus, unspecified ear: Secondary | ICD-10-CM

## 2013-05-16 DIAGNOSIS — I739 Peripheral vascular disease, unspecified: Secondary | ICD-10-CM | POA: Insufficient documentation

## 2013-05-16 NOTE — Progress Notes (Signed)
[facility] [place]   Patient name: Kim Grimes MRN: [id number] DOB: 04-May-1951 Sex: female   Referred by: Dr. Lodema Hong  Reason for referral:  Chief Complaint  Patient presents with  . New Evaluation    pt c/o being able to hear her heartbeat in her ears x 1 year off and on.     HISTORY OF PRESENT ILLNESS: This is a very pleasant 62 year old female who is referred for evaluation of hearing her heartbeat through her years.  She states that this is going on for approximately one year.  It happens when she wakes up from sleep and then slowly dissipates.  She denies any other neurologic symptoms.  She denies numbness or weakness in either extremity.  She denies slurred speech.  She denies amaurosis fugax.  Last year the patient had a carotid ultrasound which showed nearly 50% stenosis in both carotid prolapse.  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  This was followed up with a MR angiogram.  The carotid system appeared normal as did the left subclavian.  The patient's vascular risk factors include elevated cholesterol which is managed with dietary measures at this time.  She is a nonsmoker.  She does suffer from gastroesophageal reflux disease.  Past Medical History  Diagnosis Date  . IBS (irritable bowel syndrome) 2003  . Anxiety disorder   . Endometriosis   . Chronic pelvic pain in female   . Osteoporosis   . Helicobacter pylori gastritis     Dr. Juanda Chance   . Hiatal hernia 2003    on UGI   . GERD (gastroesophageal reflux disease)   . Obesity (BMI 30-39.9) DEC 2011 145 LBS  . PONV (postoperative nausea and vomiting)   . Coronary artery disease   . Carotid artery stenosis   . Hypothyroidism 2009    Past Surgical History  Procedure Laterality Date  . S/p hysterectomy    . Laparoscopy    . Adhesiolysis      dense adhesion between rectum and sigmoid,  & pelvis   . Salpingoophorectomy    . Rt foot sx    . Colonoscopy  03: AP/D  & 08: SCREENING    WNL'S  . Upper gastrointestinal endoscopy  DEC 2010: DYSPEPSIA/DYSPHAGIA    SCH RING/ESO DIL 16 MM, GASTRITIS/DUODENITIS 2o to Aleve/on OMP PRN  . Colonoscopy  07/28/2011    sessile polyp in ascending colon/internal hemorrhoids  . Esophagogastroduodenoscopy  01/19/2012    SLF: SMALL Hiatal hernia/Mild gastritis  . Bravo ph study  01/19/2012    Procedure: BRAVO PH STUDY;  Surgeon: West Bali, MD;  Location: [facility];  Service: Endoscopy;;    History   Social History  . Marital Status: Divorced    Spouse Name: N/A    Number of Children: N/A  . Years of Education: N/A   Occupational History  . fulltime at [employer]     Social History Main Topics  . Smoking status: Never Smoker   . Smokeless tobacco: Not on file  . Alcohol Use: Yes     Comment: occasional, maybe once/year  . Drug Use: No  . Sexual Activity: Not on file   Other Topics Concern  . Not on file   Social History Narrative  . No narrative on file    Family History  Problem Relation Age of Onset  . Prostate cancer Father   . Colon cancer Father     < 60 YO  .  Cancer Father     prostate and colon  . Pancreatic cancer Mother   . Cancer Mother     pancreas  . Diabetes Mother   . Diabetes Brother   . Colon polyps Neg Hx     Allergies as of 05/16/2013 - Review Complete 05/16/2013  Allergen Reaction Noted  . Diazepam Other (See Comments)     Current Outpatient Prescriptions on File Prior to Visit  Medication Sig Dispense Refill  . estradiol (ESTRACE) 1 MG tablet Take 1 mg by mouth daily.        Marland Kitchen levothyroxine (SYNTHROID, LEVOTHROID) 100 MCG tablet Take 100 mcg by mouth daily.      Marland Kitchen loratadine (CLARITIN) 10 MG tablet Take 1 tablet (10 mg total) by mouth daily.  30 tablet  2  . PRESCRIPTION MEDICATION Apply 1 application topically daily as needed. A cream that consists of Amantadine, Diclofenac, Baclofen, Bupivacaine, Propylene. Used to help with leg pain.      .  RABEprazole (ACIPHEX) 20 MG tablet 1 PO EVERY MORNING WITH BREAKFAST  90 tablet  3  . venlafaxine XR (EFFEXOR XR) 37.5 MG 24 hr capsule Take 1 capsule (37.5 mg total) by mouth daily.  30 capsule  3   No current facility-administered medications on file prior to visit.     REVIEW OF SYSTEMS: Cardiovascular: No chest pain, chest pressure, palpitations, orthopnea, or dyspnea on exertion. No claudication or rest pain,  No history of DVT or phlebitis. Pulmonary: No productive cough, asthma or wheezing. Neurologic: No weakness, paresthesias, aphasia, or amaurosis. No dizziness. Hematologic: No bleeding problems or clotting disorders. Musculoskeletal: No joint pain or joint swelling. Gastrointestinal: No blood in stool or hematemesis Genitourinary: No dysuria or hematuria. Psychiatric:: No history of major depression. Integumentary: No rashes or ulcers. Constitutional: No fever or chills.  PHYSICAL EXAMINATION: General: The patient appears their stated age.  Vital signs are BP 111/56  Pulse 80  Resp 16  Ht 4' 10.5" (1.486 m)  Wt 143 lb (64.864 kg)  BMI 29.37 kg/m2  SpO2 100% HEENT:  No gross abnormalities Pulmonary: Respirations are non-labored Musculoskeletal: There are no major deformities.   Neurologic: No focal weakness or paresthesias are detected, Skin: There are no ulcer or rashes noted. Psychiatric: The patient has normal affect. Cardiovascular: There is a regular rate and rhythm without significant murmur appreciated.  No carotid bruits  Diagnostic Studies: Ultrasound was ordered and reviewed today.  This shows less than 40% carotid stenosis bilaterally.   Assessment:  Pulsatile tinnitus Plan: Ultrasound was essentially normal today.  However, with the patient's description appearing her heartbeat in her ear, I wanted to rule out potential intracranial vascular anomalies such as dissection or aneurysmal changes.  I think this needs to be done with a CT scan.  This will be  scheduled in order to the patient will followup afterwards.     Jorge Ny, M.D. [facility] [place] Office: [phone] Pager:  [phone]

## 2013-05-24 ENCOUNTER — Other Ambulatory Visit: Payer: Self-pay | Admitting: Surgery

## 2013-05-24 LAB — CREATININE, SERUM: Creat: 0.85 mg/dL (ref 0.50–1.10)

## 2013-05-24 LAB — BUN: BUN: 13 mg/dL (ref 6–23)

## 2013-05-25 ENCOUNTER — Encounter: Payer: Self-pay | Admitting: Surgery

## 2013-05-30 ENCOUNTER — Ambulatory Visit
Admission: RE | Admit: 2013-05-30 | Discharge: 2013-05-30 | Disposition: A | Discharge status: Home / Self Care | Payer: BC Managed Care – PPO | Source: Ambulatory Visit | Attending: Surgery | Admitting: Surgery

## 2013-05-30 ENCOUNTER — Ambulatory Visit (INDEPENDENT_AMBULATORY_CARE_PROVIDER_SITE_OTHER): Payer: BC Managed Care – PPO | Admitting: Surgery

## 2013-05-30 ENCOUNTER — Encounter: Payer: Self-pay | Admitting: Surgery

## 2013-05-30 VITALS — BP 130/80 | HR 87 | Ht 58.5 in | Wt 146.0 lb

## 2013-05-30 DIAGNOSIS — H9319 Tinnitus, unspecified ear: Secondary | ICD-10-CM

## 2013-05-30 DIAGNOSIS — H93A3 Pulsatile tinnitus, bilateral: Secondary | ICD-10-CM

## 2013-05-30 DIAGNOSIS — Z01810 Encounter for preprocedural cardiovascular examination: Secondary | ICD-10-CM

## 2013-05-30 DIAGNOSIS — H93A9 Pulsatile tinnitus, unspecified ear: Secondary | ICD-10-CM | POA: Insufficient documentation

## 2013-05-30 MED ORDER — IOHEXOL 350 MG/ML SOLN
100.0000 mL | Freq: Once | INTRAVENOUS | Status: AC | PRN
  Administered 2013-05-30: 100 mL via INTRAVENOUS

## 2013-05-30 NOTE — Progress Notes (Signed)
Patient name: Kim Grimes MRN: [id number] DOB: 06/22/1951 Sex: female     Chief Complaint  Patient presents with  . Re-evaluation    1-2 wk f/u CTA neck/head prior    HISTORY OF PRESENT ILLNESS: The patient is back today for followup.  I met her last week for evaluation of hearing her heartbeat through her ears.  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  Ultrasound in our office showed less than 40% carotid stenosis.  The patient had no other neurologic symptoms.  She also had approximately 50% stenosis in the carotid arteries. I sent her for a CT scan to rule out intracranial pathology.  She is back for followup.  There is no changes since I last saw her.  Past Medical History  Diagnosis Date  . IBS (irritable bowel syndrome) 2003  . Anxiety disorder   . Endometriosis   . Chronic pelvic pain in female   . Osteoporosis   . Helicobacter pylori gastritis     Dr. Juanda Chance   . Hiatal hernia 2003    on UGI   . GERD (gastroesophageal reflux disease)   . Obesity (BMI 30-39.9) DEC 2011 145 LBS  . PONV (postoperative nausea and vomiting)   . Coronary artery disease   . Carotid artery stenosis   . Hypothyroidism 2009    Past Surgical History  Procedure Laterality Date  . S/p hysterectomy    . Laparoscopy    . Adhesiolysis      dense adhesion between rectum and sigmoid,  & pelvis   . Salpingoophorectomy    . Rt foot sx    . Colonoscopy  03: AP/D & 08: SCREENING    WNL'S  . Upper gastrointestinal endoscopy  DEC 2010: DYSPEPSIA/DYSPHAGIA    [facility] RING/ESO DIL 16 MM, GASTRITIS/DUODENITIS 2o to Aleve/on OMP PRN  . Colonoscopy  07/28/2011    sessile polyp in ascending colon/internal hemorrhoids  . Esophagogastroduodenoscopy  01/19/2012    SLF: SMALL Hiatal hernia/Mild gastritis  . Bravo ph study  01/19/2012    Procedure: BRAVO PH STUDY;  Surgeon: West Bali, MD;  Location: [facility];  Service: Endoscopy;;     History   Social History  . Marital Status: Divorced    Spouse Name: N/A    Number of Children: N/A  . Years of Education: N/A   Occupational History  . fulltime at [employer]     Social History Main Topics  . Smoking status: Never Smoker   . Smokeless tobacco: Not on file  . Alcohol Use: Yes     Comment: occasional, maybe once/year  . Drug Use: No  . Sexual Activity: Not on file   Other Topics Concern  . Not on file   Social History Narrative  . No narrative on file    Family History  Problem Relation Age of Onset  . Prostate cancer Father   . Colon cancer Father     < 60 YO  . Cancer Father     prostate and colon  . Pancreatic cancer Mother   . Cancer Mother     pancreas  . Diabetes Mother   . Diabetes Brother   . Colon polyps Neg Hx     Allergies as of 05/30/2013 - Review Complete 05/30/2013  Allergen Reaction Noted  . Diazepam Other (See Comments)     Current Outpatient Prescriptions on File Prior to Visit  Medication Sig Dispense  Refill  . estradiol (ESTRACE) 1 MG tablet Take 1 mg by mouth daily.        Marland Kitchen levothyroxine (SYNTHROID, LEVOTHROID) 100 MCG tablet Take 100 mcg by mouth daily.      Marland Kitchen loratadine (CLARITIN) 10 MG tablet Take 1 tablet (10 mg total) by mouth daily.  30 tablet  2  . PRESCRIPTION MEDICATION Apply 1 application topically daily as needed. A cream that consists of Amantadine, Diclofenac, Baclofen, Bupivacaine, Propylene. Used to help with leg pain.      . RABEprazole (ACIPHEX) 20 MG tablet 1 PO EVERY MORNING WITH BREAKFAST  90 tablet  3  . venlafaxine XR (EFFEXOR XR) 37.5 MG 24 hr capsule Take 1 capsule (37.5 mg total) by mouth daily.  30 capsule  3   No current facility-administered medications on file prior to visit.     REVIEW OF SYSTEMS: No changes from prior visit  PHYSICAL EXAMINATION:   Vital signs are BP 130/80  Pulse 87  Ht 4' 10.5" (1.486 m)  Wt 146 lb (66.225 kg)  BMI 29.99 kg/m2  SpO2  100% General: The patient appears their stated age. HEENT:  No gross abnormalities Pulmonary:  Non labored breathing Musculoskeletal: There are no major deformities. Neurologic: No focal weakness or paresthesias are detected, Psychiatric: The patient has normal affect.    Diagnostic Studies  I have reviewed the CT angiogram.  No dissection or aneurysmal changes were noted within the carotid arterial system.  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    Assessment:  hearing her heartbeat in her ears  Plan:  I discussed the CT scan findings today with the patient.  I find no vascular etiology for the patient's complaints.  I reassured her that there was no evidence of dissection or aneurysm on the CT scan findings.  I did discuss with her that she had an ABI the right subclavian artery.  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  I will keep a nylon this as she is prone to develop aneurysmal changes in this area, although she has no evidence of this to date.  I recommended repeating a CT angiogram of the chest in 5 years.  She will come back following that and also obtain a carotid ultrasound. she has already seen in your nose and throat doctor for the above complaints.  Hopefully her symptoms will resolve with time.  I spent greater than 30 minutes discussing the findings with the and reviewing the CT scan images  V. Charlena Cross, M.D. [facility] Office: [phone] Pager:  [phone]

## 2013-05-31 NOTE — Addendum Note (Signed)
Addended by: Adria Dill L on: 05/31/2013 10:28 AM   Modules accepted: Orders

## 2013-07-27 ENCOUNTER — Ambulatory Visit: Payer: BC Managed Care – PPO | Admitting: Gastroenterology

## 2013-08-18 ENCOUNTER — Encounter: Payer: Self-pay | Admitting: Gastroenterology

## 2013-08-18 ENCOUNTER — Ambulatory Visit (INDEPENDENT_AMBULATORY_CARE_PROVIDER_SITE_OTHER): Payer: BC Managed Care – PPO | Admitting: Gastroenterology

## 2013-08-18 VITALS — BP 128/88 | HR 73 | Temp 97.6°F | Wt 142.0 lb

## 2013-08-18 DIAGNOSIS — K589 Irritable bowel syndrome without diarrhea: Secondary | ICD-10-CM

## 2013-08-18 DIAGNOSIS — K219 Gastro-esophageal reflux disease without esophagitis: Secondary | ICD-10-CM

## 2013-08-18 NOTE — Assessment & Plan Note (Signed)
SX NOT IDEALLY CONTROLLED.  TRY LINZESS 145 MCG. IT MAY CAUSE EXPLOSIVE DIARRHEA. CONTINUE YOUR WEIGHT LOSS EFFORTS. DRINK WATER TO KEEP YOUR URINE LIGHT YELLOW. FOLLOW A HIGH FIBER DIET. HO GIVEN. FOLLOW UP IN 6 MOS.

## 2013-08-18 NOTE — Assessment & Plan Note (Signed)
SX FAIRLY WELL CONTROLLED.  CONTINUE ACIPHEX. MAY USE ADDITIONAL ONE AS NEEDED LOW FAT DIET CONTINUE YOUR WEIGHT LOSS EFFORTS. OPV IN 6 MOS.

## 2013-08-18 NOTE — Progress Notes (Signed)
Subjective:    Patient ID: Kim Grimes, female    DOB: 01/28/1951, 63 y.o.   MRN: [id number] [name], MD   HPI CONSTIPATED EVERY TIME SHE EATS/OFF AND ON. HAS TO KEEP GOING. DRINKS WATER AND IT GETS BETTER/ GAVE UP SODA. WEIGHT SAME. HASN'T GIVEN UP COFFEE. NOT TAKING PAMELOR QHS. TAKES IT EVER NOW AND THEN. MAY NEED TO TAKE IT TWICE A DAY. ONCE FOR A WHOLE WEEK-FLARE(BURNING IN THROAT, COULDN'T TALK). BEEN GOING TO GYM WORKING OUT ABS AND HAS TENDERNESS IN EPIGASTRIUM.  Past Medical History  Diagnosis Date  . IBS (irritable bowel syndrome) 2003  . Anxiety disorder   . Endometriosis   . Chronic pelvic pain in female   . Osteoporosis   . Helicobacter pylori gastritis     Dr. Olevia Perches   . Hiatal hernia 2003    on UGI   . GERD (gastroesophageal reflux disease)   . Obesity (BMI 30-39.9) DEC 2011 145 LBS  . PONV (postoperative nausea and vomiting)   . Coronary artery disease   . Carotid artery stenosis   . Hypothyroidism 2009   Past Surgical History  Procedure Laterality Date  . S/p hysterectomy    . Laparoscopy    . Adhesiolysis      dense adhesion between rectum and sigmoid,  & pelvis   . Salpingoophorectomy    . Rt foot sx    . Colonoscopy  03: AP/D & 08: SCREENING    WNL'S  . Upper gastrointestinal endoscopy  DEC 2010: DYSPEPSIA/DYSPHAGIA    [place] RING/ESO DIL 16 MM, GASTRITIS/DUODENITIS 2o to Aleve/on OMP PRN  . Colonoscopy  07/28/2011    sessile polyp in ascending colon/internal hemorrhoids  . Esophagogastroduodenoscopy  01/19/2012    SLF: SMALL Hiatal hernia/Mild gastritis  . Bravo ph study  01/19/2012    Procedure: BRAVO [place];  Surgeon: [name], MD;  Location: [facility];  Service: Endoscopy;;   Allergies  Allergen Reactions  . Diazepam Other (See Comments)    Aggitation    Current Outpatient Prescriptions  Medication Sig Dispense Refill  . estradiol (ESTRACE) 1 MG tablet Take 1 mg by mouth daily.        Marland Kitchen levothyroxine (SYNTHROID,  LEVOTHROID) 100 MCG tablet Take 100 mcg by mouth daily.      Marland Kitchen loratadine (CLARITIN) 10 MG tablet Take 1 tablet (10 mg total) by mouth daily.    Marland Kitchen PRESCRIPTION MEDICATION Apply 1 application topically daily as needed. A cream that consists of Amantadine, Diclofenac, Baclofen, Bupivacaine, Propylene. Used to help with leg pain.    . RABEprazole (ACIPHEX) 20 MG tablet 1 PO EVERY MORNING WITH BREAKFAST    .          Review of Systems     Objective:   Physical Exam  Vitals reviewed. Constitutional: She is oriented to person, place, and time. She appears well-nourished. No distress.  HENT:  Head: Normocephalic and atraumatic.  Mouth/Throat: Oropharynx is clear and moist. No oropharyngeal exudate.  Eyes: Pupils are equal, round, and reactive to light. No scleral icterus.  Neck: Normal range of motion. Neck supple.  Cardiovascular: Normal rate, regular rhythm and normal heart sounds.   Pulmonary/Chest: Effort normal and breath sounds normal. No respiratory distress.  Abdominal: Soft. Bowel sounds are normal. She exhibits no distension. There is tenderness. There is no rebound and no guarding.  MILD TO MODERATE TTP IN EPIGASTRIUM AND LLQ  Musculoskeletal: She exhibits no edema.  Lymphadenopathy:  She has no cervical adenopathy.  Neurological: She is alert and oriented to person, place, and time.  NO FOCAL DEFICITS   Psychiatric: She has a normal mood and affect.          Assessment & Plan:

## 2013-08-18 NOTE — Progress Notes (Signed)
cc'd to pcp 

## 2013-08-18 NOTE — Patient Instructions (Signed)
TRY LINZESS 145 MCG. IT MAY CAUSE EXPLOSIVE DIARRHEA. IF IT WORKS CALL FOR A PRESCRIPTION.  IF LINZESS CAUSES DIARRHEA, USE MIRALAX DAILY.  CONTINUE EXERCISING AND YOUR WEIGHT LOSS EFFORTS.  DRINK WATER TO KEEP YOUR URINE LIGHT YELLOW.  FOLLOW A HIGH FIBER DIET. SEE INFO BELOW.  FOLLOW UP IN 6 MOS.   High-Fiber Diet A high-fiber diet changes your normal diet to include more whole grains, legumes, fruits, and vegetables. Changes in the diet involve replacing refined carbohydrates with unrefined foods. The calorie level of the diet is essentially unchanged. The Dietary Reference Intake (recommended amount) for adult males is 38 grams per day. For adult females, it is 25 grams per day. Pregnant and lactating women should consume 28 grams of fiber per day. Fiber is the intact part of a plant that is not broken down during digestion. Functional fiber is fiber that has been isolated from the plant to provide a beneficial effect in the body. PURPOSE  Increase stool bulk.   Ease and regulate bowel movements.   Lower cholesterol.  INDICATIONS THAT YOU NEED MORE FIBER  Constipation and hemorrhoids.   Uncomplicated diverticulosis (intestine condition) and irritable bowel syndrome.   Weight management.   As a protective measure against hardening of the arteries (atherosclerosis), diabetes, and cancer.   GUIDELINES FOR INCREASING FIBER IN THE DIET  Start adding fiber to the diet slowly. A gradual increase of about 5 more grams (2 slices of whole-wheat bread, 2 servings of most fruits or vegetables, or 1 bowl of high-fiber cereal) per day is best. Too rapid an increase in fiber may result in constipation, flatulence, and bloating.   Drink enough water and fluids to keep your urine clear or pale yellow. Water, juice, or caffeine-free drinks are recommended. Not drinking enough fluid may cause constipation.   Eat a variety of high-fiber foods rather than one type of fiber.   Try to increase  your intake of fiber through using high-fiber foods rather than fiber pills or supplements that contain small amounts of fiber.   The goal is to change the types of food eaten. Do not supplement your present diet with high-fiber foods, but replace foods in your present diet.  INCLUDE A VARIETY OF FIBER SOURCES  Replace refined and processed grains with whole grains, canned fruits with fresh fruits, and incorporate other fiber sources. White rice, white breads, and most bakery goods contain little or no fiber.   Brown whole-grain rice, buckwheat oats, and many fruits and vegetables are all good sources of fiber. These include: broccoli, Brussels sprouts, cabbage, cauliflower, beets, sweet potatoes, white potatoes (skin on), carrots, tomatoes, eggplant, squash, berries, fresh fruits, and dried fruits.   Cereals appear to be the richest source of fiber. Cereal fiber is found in whole grains and bran. Bran is the fiber-rich outer coat of cereal grain, which is largely removed in refining. In whole-grain cereals, the bran remains. In breakfast cereals, the largest amount of fiber is found in those with "bran" in their names. The fiber content is sometimes indicated on the label.   You may need to include additional fruits and vegetables each day.   In baking, for 1 cup white flour, you may use the following substitutions:   1 cup whole-wheat flour minus 2 tablespoons.   1/2 cup white flour plus 1/2 cup whole-wheat flour.

## 2013-08-23 NOTE — Progress Notes (Signed)
Reminder in epic °

## 2013-09-06 ENCOUNTER — Ambulatory Visit: Payer: BC Managed Care – PPO | Admitting: Family Medicine

## 2013-09-12 ENCOUNTER — Other Ambulatory Visit: Payer: Self-pay

## 2013-09-12 DIAGNOSIS — Z1231 Encounter for screening mammogram for malignant neoplasm of breast: Secondary | ICD-10-CM

## 2013-09-13 ENCOUNTER — Other Ambulatory Visit: Payer: Self-pay | Admitting: Obstetrics and Gynecology

## 2013-09-13 DIAGNOSIS — Z8739 Personal history of other diseases of the musculoskeletal system and connective tissue: Secondary | ICD-10-CM

## 2013-09-13 DIAGNOSIS — M8580 Other specified disorders of bone density and structure, unspecified site: Secondary | ICD-10-CM

## 2013-09-16 ENCOUNTER — Ambulatory Visit: Payer: BC Managed Care – PPO | Admitting: Family Medicine

## 2013-09-19 ENCOUNTER — Telehealth: Payer: Self-pay | Admitting: Family Medicine

## 2013-09-19 NOTE — Telephone Encounter (Signed)
Scratchy throat, coughing a lot, dry cough. Tried nyquil. Has been going on since last week. Advised to try OTC robitussin med and call back if no better after a few days

## 2013-09-19 NOTE — Telephone Encounter (Signed)
Called patient and left message for them to return call at the office   

## 2013-09-27 ENCOUNTER — Ambulatory Visit: Payer: BC Managed Care – PPO

## 2013-09-27 ENCOUNTER — Other Ambulatory Visit: Payer: BC Managed Care – PPO

## 2013-09-28 ENCOUNTER — Ambulatory Visit: Payer: BC Managed Care – PPO | Admitting: Family Medicine

## 2013-10-05 ENCOUNTER — Other Ambulatory Visit: Payer: BC Managed Care – PPO

## 2013-10-05 ENCOUNTER — Ambulatory Visit: Payer: BC Managed Care – PPO

## 2013-10-14 ENCOUNTER — Ambulatory Visit
Admission: RE | Admit: 2013-10-14 | Discharge: 2013-10-14 | Disposition: A | Discharge status: Home / Self Care | Payer: BC Managed Care – PPO | Source: Ambulatory Visit | Attending: Obstetrics and Gynecology | Admitting: Obstetrics and Gynecology

## 2013-10-14 ENCOUNTER — Ambulatory Visit
Admission: RE | Admit: 2013-10-14 | Discharge: 2013-10-14 | Disposition: A | Discharge status: Home / Self Care | Payer: BC Managed Care – PPO | Source: Ambulatory Visit

## 2013-10-14 DIAGNOSIS — Z8739 Personal history of other diseases of the musculoskeletal system and connective tissue: Secondary | ICD-10-CM

## 2013-10-14 DIAGNOSIS — Z1231 Encounter for screening mammogram for malignant neoplasm of breast: Secondary | ICD-10-CM

## 2013-10-14 DIAGNOSIS — M8580 Other specified disorders of bone density and structure, unspecified site: Secondary | ICD-10-CM

## 2013-10-18 HISTORY — PX: EYE SURGERY: SHX253

## 2013-10-19 ENCOUNTER — Encounter: Payer: Self-pay | Admitting: Family Medicine

## 2013-10-19 ENCOUNTER — Ambulatory Visit (INDEPENDENT_AMBULATORY_CARE_PROVIDER_SITE_OTHER): Payer: BC Managed Care – PPO | Admitting: Family Medicine

## 2013-10-19 VITALS — BP 134/80 | HR 68 | Resp 18 | Wt 147.1 lb

## 2013-10-19 DIAGNOSIS — R0989 Other specified symptoms and signs involving the circulatory and respiratory systems: Secondary | ICD-10-CM

## 2013-10-19 DIAGNOSIS — E039 Hypothyroidism, unspecified: Secondary | ICD-10-CM

## 2013-10-19 DIAGNOSIS — R1900 Intra-abdominal and pelvic swelling, mass and lump, unspecified site: Secondary | ICD-10-CM

## 2013-10-19 LAB — BASIC METABOLIC PANEL
BUN: 15 mg/dL (ref 6–23)
CO2: 29 mEq/L (ref 19–32)
Calcium: 9.3 mg/dL (ref 8.4–10.5)
Chloride: 106 mEq/L (ref 96–112)
Creat: 0.83 mg/dL (ref 0.50–1.10)
Glucose, Bld: 90 mg/dL (ref 70–99)
Potassium: 4.1 mEq/L (ref 3.5–5.3)
Sodium: 144 mEq/L (ref 135–145)

## 2013-10-19 NOTE — Progress Notes (Signed)
   Subjective:    Patient ID: Kim Grimes, female    DOB: 1950/09/07, 63 y.o.   MRN: [id number]  HPI The PT is here for follow up and re-evaluation of chronic medical conditions, medication management and review of any available recent lab and radiology data.  Preventive health is updated, specifically  Cancer screening and Immunization.   Over 4 year h/o epigastric swelling, which she feels is enlarging, wants this removed Recently had right cataract extraction and has the other eye upcoming, has had no complications  The PT denies any adverse reactions to current medications since the last visit.  C/o progressive hoarseness, she has established reflux, and I advise ENT evaluation however states 'not at this time"      Review of Systems See HPI Denies recent fever or chills. Denies sinus pressure, nasal congestion, ear pain or sore throat. Denies chest congestion, productive cough or wheezing. Denies chest pains, palpitations and leg swelling Denies uncontrolled abdominal pain,however doe c/o intermittent epigastric discomfort with bloating, denies  nausea, vomiting,diarrhea or constipation.   Denies dysuria, frequency, hesitancy or incontinence. C/o chronic back pain with some limitation in mobility. Denies headaches, seizures, numbness, or tingling. Denies depression, anxiety or insomnia. Denies skin break down or rash.        Objective:   Physical Exam BP 134/80  Pulse 68  Resp 18  Wt 147 lb 1.3 oz (66.715 kg)  SpO2 96% Patient alert and oriented and in no cardiopulmonary distress.  HEENT: No facial asymmetry, EOMI,   oropharynx pink and moist.  Neck supple no JVD, no mass.TM clear  Chest: Clear to auscultation bilaterally.  CVS: S1, S2 no murmurs, no S3.Regular rate.  ABD: Soft mild epigastric tenderness to deep palpation. No plpable mass or organomegaly, possible umbilical hernia, normal bS  Ext: No edema   though reduced  ROM lumbar  Spine,normal in   shoulders, hips and knees.  Skin: Intact, no ulcerations or rash noted.  Psych: Good eye contact, normal affect. Memory intact not anxious or depressed appearing.  CNS: CN 2-12 intact, power,  normal throughout.no focal deficits noted.        Assessment & Plan:  Abdominal mass Pt c/o abdominal mass, clinical exam is consistent with a hernia, she is referred for furhter eval with scan and willrefer to the surgeon of her choice once result is available per her request  Carotid artery bruit Needs vascular follow up, pt will sched as already an established patient  HYPOTHYROIDISM Asymptomatic, stable, treated by endo  GERD (gastroesophageal reflux disease) Uncontrolled due to c/o chronic progressive hoarseness, ENT eval suggested, however pt elects to defer at this time, but knows to call in for referral when /if she changes her mind

## 2013-10-19 NOTE — Patient Instructions (Signed)
F/u in 6 month, call if you need me before  You are referred for abdominal scan to further assess the area of concern  Chem 7 today stat  Recurrent voice loss is likely due to uncontrolled reflux, call for ENT eval when you decide please  Happy about successful right eye surgery and all the best with upcoming left eye

## 2013-10-20 ENCOUNTER — Ambulatory Visit (HOSPITAL_COMMUNITY)
Admission: RE | Admit: 2013-10-20 | Discharge: 2013-10-20 | Disposition: A | Discharge status: Home / Self Care | Payer: BC Managed Care – PPO | Source: Ambulatory Visit | Attending: Family Medicine | Admitting: Family Medicine

## 2013-10-20 DIAGNOSIS — R1900 Intra-abdominal and pelvic swelling, mass and lump, unspecified site: Secondary | ICD-10-CM | POA: Insufficient documentation

## 2013-10-20 DIAGNOSIS — K429 Umbilical hernia without obstruction or gangrene: Secondary | ICD-10-CM | POA: Insufficient documentation

## 2013-10-20 MED ORDER — IOHEXOL 300 MG/ML  SOLN
100.0000 mL | Freq: Once | INTRAMUSCULAR | Status: AC | PRN
  Administered 2013-10-20: 100 mL via INTRAVENOUS

## 2013-10-26 ENCOUNTER — Telehealth: Payer: Self-pay | Admitting: Family Medicine

## 2013-10-26 NOTE — Telephone Encounter (Signed)
Called patient and left message for them to return call at the office   

## 2013-10-27 ENCOUNTER — Telehealth: Payer: Self-pay | Admitting: Family Medicine

## 2013-10-28 ENCOUNTER — Other Ambulatory Visit: Payer: Self-pay | Admitting: Family Medicine

## 2013-10-28 DIAGNOSIS — K429 Umbilical hernia without obstruction or gangrene: Secondary | ICD-10-CM

## 2013-10-28 NOTE — Telephone Encounter (Signed)
2nd attempt maade to cqll pt fom the office today, message left on her phone to contact me

## 2013-10-28 NOTE — Telephone Encounter (Signed)
Letter typed

## 2013-10-28 NOTE — Telephone Encounter (Signed)
Courtney spoke with patient

## 2013-10-28 NOTE — Telephone Encounter (Signed)
Pl type letter stating that due to chronic  medical condition, patient should be limited to lifting no more than 20 pounds on a continual basis. I will sign. I have discussed this with her. He will collect next Monday Thanks

## 2013-10-28 NOTE — Telephone Encounter (Signed)
Patient would like to know if letter can be written stating that she cannot lift amount of weight specified in job description due to results of recent imaging.     Patient is also asking if she can speak with the doctor personally regarding results of imaging.

## 2013-10-28 NOTE — Telephone Encounter (Signed)
Message left for pt to return my call by having hospital page me as I am working outside of the office today

## 2013-10-31 ENCOUNTER — Telehealth: Payer: Self-pay | Admitting: Family Medicine

## 2013-10-31 NOTE — Telephone Encounter (Signed)
Noted  

## 2013-12-21 ENCOUNTER — Encounter (INDEPENDENT_AMBULATORY_CARE_PROVIDER_SITE_OTHER): Payer: BC Managed Care – PPO | Admitting: Ophthalmology

## 2013-12-21 ENCOUNTER — Encounter (INDEPENDENT_AMBULATORY_CARE_PROVIDER_SITE_OTHER): Payer: Self-pay | Admitting: Ophthalmology

## 2013-12-21 DIAGNOSIS — H2510 Age-related nuclear cataract, unspecified eye: Secondary | ICD-10-CM

## 2013-12-21 DIAGNOSIS — H43819 Vitreous degeneration, unspecified eye: Secondary | ICD-10-CM

## 2013-12-21 DIAGNOSIS — D3130 Benign neoplasm of unspecified choroid: Secondary | ICD-10-CM

## 2013-12-21 DIAGNOSIS — H2700 Aphakia, unspecified eye: Secondary | ICD-10-CM

## 2014-01-02 ENCOUNTER — Encounter: Payer: Self-pay | Admitting: Family Medicine

## 2014-01-02 NOTE — Assessment & Plan Note (Signed)
Asymptomatic, stable, treated by endo

## 2014-01-02 NOTE — Assessment & Plan Note (Signed)
Needs vascular follow up, pt will sched as already an established patient

## 2014-01-02 NOTE — Assessment & Plan Note (Signed)
Uncontrolled due to c/o chronic progressive hoarseness, ENT eval suggested, however pt elects to defer at this time, but knows to call in for referral when /if she changes her mind

## 2014-01-02 NOTE — Assessment & Plan Note (Signed)
Pt c/o abdominal mass, clinical exam is consistent with a hernia, she is referred for furhter eval with scan and willrefer to the surgeon of her choice once result is available per her request

## 2014-02-06 ENCOUNTER — Encounter: Payer: Self-pay | Admitting: Gastroenterology

## 2014-03-15 ENCOUNTER — Ambulatory Visit: Payer: BC Managed Care – PPO | Admitting: Gastroenterology

## 2014-04-27 ENCOUNTER — Ambulatory Visit: Payer: BC Managed Care – PPO

## 2014-05-01 ENCOUNTER — Other Ambulatory Visit: Payer: Self-pay | Admitting: Gastroenterology

## 2014-05-08 ENCOUNTER — Telehealth: Payer: Self-pay | Admitting: Gastroenterology

## 2014-05-08 MED ORDER — DEXLANSOPRAZOLE 60 MG PO CPDR: 60.0000 mg | DELAYED_RELEASE_CAPSULE | Freq: Every day | ORAL | Status: DC

## 2014-05-08 NOTE — Telephone Encounter (Signed)
Sure. I sent in [place].

## 2014-05-08 NOTE — Telephone Encounter (Signed)
Patient states the prescription aciphex is too expensive, can she be put back on dexilant.  Pharmacy is [facility] in [place].  Please advise [phone]

## 2014-05-08 NOTE — Telephone Encounter (Signed)
I called pt and told her I will check on this for her.

## 2014-05-08 NOTE — Telephone Encounter (Signed)
PT is aware and said the pharmacy is sending paper work for [place].

## 2014-05-08 NOTE — Addendum Note (Signed)
Addended by: Orvil Feil on: 05/08/2014 11:18 AM   Modules accepted: Orders

## 2014-05-09 ENCOUNTER — Ambulatory Visit (INDEPENDENT_AMBULATORY_CARE_PROVIDER_SITE_OTHER): Payer: BC Managed Care – PPO | Admitting: Family Medicine

## 2014-05-09 ENCOUNTER — Ambulatory Visit (INDEPENDENT_AMBULATORY_CARE_PROVIDER_SITE_OTHER): Payer: BC Managed Care – PPO

## 2014-05-09 ENCOUNTER — Encounter: Payer: Self-pay | Admitting: Family Medicine

## 2014-05-09 ENCOUNTER — Other Ambulatory Visit: Payer: Self-pay | Admitting: Family Medicine

## 2014-05-09 VITALS — BP 118/84 | HR 82 | Resp 16 | Ht 58.5 in | Wt 151.0 lb

## 2014-05-09 DIAGNOSIS — E65 Localized adiposity: Secondary | ICD-10-CM | POA: Insufficient documentation

## 2014-05-09 DIAGNOSIS — Z23 Encounter for immunization: Secondary | ICD-10-CM

## 2014-05-09 DIAGNOSIS — Z13220 Encounter for screening for lipoid disorders: Secondary | ICD-10-CM

## 2014-05-09 DIAGNOSIS — R7302 Impaired glucose tolerance (oral): Secondary | ICD-10-CM | POA: Insufficient documentation

## 2014-05-09 DIAGNOSIS — R7989 Other specified abnormal findings of blood chemistry: Secondary | ICD-10-CM | POA: Insufficient documentation

## 2014-05-09 DIAGNOSIS — E039 Hypothyroidism, unspecified: Secondary | ICD-10-CM

## 2014-05-09 DIAGNOSIS — K219 Gastro-esophageal reflux disease without esophagitis: Secondary | ICD-10-CM

## 2014-05-09 LAB — CBC WITH DIFFERENTIAL/PLATELET
Basophils Absolute: 0 10*3/uL (ref 0.0–0.1)
Basophils Relative: 0 % (ref 0–1)
Eosinophils Absolute: 0.1 10*3/uL (ref 0.0–0.7)
Eosinophils Relative: 2 % (ref 0–5)
HCT: 41.8 % (ref 36.0–46.0)
Hemoglobin: 14.2 g/dL (ref 12.0–15.0)
Lymphocytes Relative: 23 % (ref 12–46)
Lymphs Abs: 1.5 10*3/uL (ref 0.7–4.0)
MCH: 28.7 pg (ref 26.0–34.0)
MCHC: 34 g/dL (ref 30.0–36.0)
MCV: 84.4 fL (ref 78.0–100.0)
Monocytes Absolute: 0.5 10*3/uL (ref 0.1–1.0)
Monocytes Relative: 8 % (ref 3–12)
Neutro Abs: 4.4 10*3/uL (ref 1.7–7.7)
Neutrophils Relative %: 67 % (ref 43–77)
Platelets: 247 10*3/uL (ref 150–400)
RBC: 4.95 MIL/uL (ref 3.87–5.11)
RDW: 13.7 % (ref 11.5–15.5)
WBC: 6.5 10*3/uL (ref 4.0–10.5)

## 2014-05-09 LAB — BASIC METABOLIC PANEL
BUN: 12 mg/dL (ref 6–23)
CO2: 28 mEq/L (ref 19–32)
Calcium: 9.7 mg/dL (ref 8.4–10.5)
Chloride: 104 mEq/L (ref 96–112)
Creat: 0.79 mg/dL (ref 0.50–1.10)
Glucose, Bld: 81 mg/dL (ref 70–99)
Potassium: 4.2 mEq/L (ref 3.5–5.3)
Sodium: 140 mEq/L (ref 135–145)

## 2014-05-09 LAB — LIPID PANEL
Cholesterol: 195 mg/dL (ref 0–200)
HDL: 59 mg/dL (ref >39)
LDL Cholesterol: 113 mg/dL — ABNORMAL HIGH (ref 0–99)
Total CHOL/HDL Ratio: 3.3 Ratio
Triglycerides: 113 mg/dL (ref <150)
VLDL: 23 mg/dL (ref 0–40)

## 2014-05-09 NOTE — Patient Instructions (Signed)
F/u in 4 month, call if you need me before  Flu vaccine  Today  Fasting lipid, chem 7, CBc today  You are referred to  Nutritionist to help to prevent  Or reduce risk of becoming diabetic  It is important that you exercise regularly at least 30 minutes 5 times a week. If you develop chest pain, have severe difficulty breathing, or feel very tired, stop exercising immediately and seek medical attention   Please start daily claritin for allergies , which is OTC, and OK to take sudafed one daily as needed, for extra drainage and pressure  Area on left leg should evntually totally disappear, based on history , this is old blood which is dissolving

## 2014-05-09 NOTE — Assessment & Plan Note (Signed)
Increased CV risk discussed due to elevated LDL, abnormal blpood suagr and large waist Lifestyle changes needed and discussed F/u in 4 month

## 2014-05-09 NOTE — Assessment & Plan Note (Signed)
Hyperlipidemia:Low fat diet discussed and encouraged.  Updated lab needed at/ before next visit.  

## 2014-05-09 NOTE — Progress Notes (Signed)
   Subjective:    Patient ID: Kim Grimes, female    DOB: [birth year], 62 y.o.   MRN: [id number]  HPI The PT is here for follow up and re-evaluation of chronic medical conditions, medication management and review of any available recent lab and radiology data.  Preventive health is updated, specifically  Cancer screening and Immunization.   Questions or concerns regarding consultations or procedures which the PT has had in the interim are  addressed.Has seen vascular, GI and gyne this year The PT denies any adverse reactions to current medications since the last visit.  C/o bruised area on left leg upper outer aspect, first noted after prolonged flight to [place], had been darker, no redness or drainage, fading  Gradually want it chrecked  Aware of weight gain, no longer working and recently advised that blood sugar is elevated, and needs to work on this, positive f/h of diabetes      Review of Systems .See HPI Denies recent fever or chills. Denies sinus pressure, nasal congestion, ear pain or sore throat. Denies chest congestion, productive cough or wheezing. Denies chest pains, palpitations and leg swelling Denies abdominal pain, nausea, vomiting,diarrhea or constipation.   Denies dysuria, frequency, hesitancy or incontinence. Denies joint pain, swelling and limitation in mobility. Denies headaches, seizures, numbness, or tingling. Denies depression, anxiety or insomnia.       Objective:   Physical Exam BP 118/84 mmHg  Pulse 82  Resp 16  Ht 4' 10.5" (1.486 m)  Wt 151 lb (68.493 kg)  BMI 31.02 kg/m2  SpO2 96% Patient alert and oriented and in no cardiopulmonary distress.  HEENT: No facial asymmetry, EOMI,   oropharynx pink and moist.  Neck supple no JVD, no mass.  Chest: Clear to auscultation bilaterally.  CVS: S1, S2 no murmurs, no S3.Regular rate.  ABD: Soft non tender.   Ext: No edema  MS: Adequate ROM spine, shoulders, hips and knees.  Skin: Intact, no  ulcerations or rash noted.Area of fading bruise on left upper leg max dia approx 2cm  Psych: Good eye contact, normal affect. Memory intact not anxious or depressed appearing.  CNS: CN 2-12 intact, power,  normal throughout.no focal deficits noted.        Assessment & Plan:  IGT (impaired glucose tolerance) Reports positive f/y diabetes in ,multiple family members, and states shewas recently told by her gyne that she is prediabetic and has been recommended to nutrtionist will obtain hBa1C result, Patient educated about the importance of limiting  Carbohydrate intake , the need to commit to daily physical activity for a minimum of 30 minutes , and to commit weight loss. The fact that changes in all these areas will reduce or eliminate all together the development of diabetes is stressed.     Hypothyroidism followed annually by endo, feels uinder correction may be causing weight gain, no symptoms of hypothyroidism, opts to wait on her endo to test TSH, since asymptomatic no pressure to check this  GERD (gastroesophageal reflux disease) Controlled, no change in medication Followed by gI  High serum low-density lipoprotein (LDL) Hyperlipidemia:Low fat diet discussed and encouraged.  Updated lab needed at/ before next visit.   Truncal obesity Increased CV risk discussed due to elevated LDL, abnormal blpood suagr and large waist Lifestyle changes needed and discussed F/u in 4 month  Need for prophylactic vaccination and inoculation against influenza Vaccine administered at visit.

## 2014-05-09 NOTE — Assessment & Plan Note (Signed)
Controlled, no change in medication Followed by gI 

## 2014-05-09 NOTE — Assessment & Plan Note (Signed)
followed annually by endo, feels uinder correction may be causing weight gain, no symptoms of hypothyroidism, opts to wait on her endo to test TSH, since asymptomatic no pressure to check this

## 2014-05-09 NOTE — Assessment & Plan Note (Signed)
Reports positive f/y diabetes in ,multiple family members, and states shewas recently told by her gyne that she is prediabetic and has been recommended to nutrtionist will obtain hBa1C result, Patient educated about the importance of limiting  Carbohydrate intake , the need to commit to daily physical activity for a minimum of 30 minutes , and to commit weight loss. The fact that changes in all these areas will reduce or eliminate all together the development of diabetes is stressed.

## 2014-05-09 NOTE — Assessment & Plan Note (Signed)
Vaccine administered at visit.  

## 2014-05-11 LAB — HEMOGLOBIN A1C
Hgb A1c MFr Bld: 5.4 % (ref <5.7)
Mean Plasma Glucose: 108 mg/dL (ref <117)

## 2014-05-16 ENCOUNTER — Telehealth: Payer: Self-pay | Admitting: *Deleted

## 2014-05-16 ENCOUNTER — Other Ambulatory Visit: Payer: Self-pay

## 2014-05-16 MED ORDER — LEVOFLOXACIN 500 MG PO TABS: 500.0000 mg | ORAL_TABLET | Freq: Every day | ORAL | Status: DC

## 2014-05-16 MED ORDER — BENZONATATE 100 MG PO CAPS: 100.0000 mg | ORAL_CAPSULE | Freq: Two times a day (BID) | ORAL | Status: DC | PRN

## 2014-05-16 NOTE — Telephone Encounter (Signed)
pls send in levaquinn 500mg  one daily fopr 1 week only, also send tessalon perles 100mg  twice daily for 10 days , as decongestant if she wishes Advise will need to call and  sched appt if not better in next week or go to UC or Ed if worsening and if we have no appts available when she calls

## 2014-05-16 NOTE — Telephone Encounter (Signed)
Pt called stating she has been having a bad cough and she has tried taking everything over the counter and nothing is helping, pt would like to know if Dr. Moshe Cipro can call her in something, pt stated she is coughing up green stuff pt thinks she may have sinus and it is draining from her ear. Please advise

## 2014-05-16 NOTE — Telephone Encounter (Signed)
Med sent to [facility]

## 2014-05-16 NOTE — Telephone Encounter (Signed)
Coughing- especially at night with some green phlegm x 3 days, sinuses congested and some right ear pressure. Has tried OTC products with no relief. Wants something sent in since she was just here. Please advise

## 2014-05-23 ENCOUNTER — Telehealth: Payer: Self-pay

## 2014-05-23 NOTE — Telephone Encounter (Signed)
PA approved for dexilant for 05/23/14 - 06/29/2038

## 2014-05-24 ENCOUNTER — Ambulatory Visit: Payer: BC Managed Care – PPO

## 2014-05-24 ENCOUNTER — Encounter: Payer: BC Managed Care – PPO | Attending: "Endocrinology | Admitting: Nutrition

## 2014-05-24 VITALS — Ht 58.8 in | Wt 149.8 lb

## 2014-05-24 VITALS — Wt 151.0 lb

## 2014-05-24 DIAGNOSIS — B029 Zoster without complications: Secondary | ICD-10-CM

## 2014-05-24 DIAGNOSIS — E669 Obesity, unspecified: Secondary | ICD-10-CM

## 2014-05-24 MED ORDER — ACYCLOVIR 800 MG PO TABS
800.0000 mg | ORAL_TABLET | Freq: Every day | ORAL | Status: DC
Start: 2014-05-24 — End: 2015-08-31

## 2014-05-24 MED ORDER — GABAPENTIN 300 MG PO CAPS: 300.0000 mg | ORAL_CAPSULE | Freq: Every day | ORAL | Status: DC

## 2014-05-24 NOTE — Progress Notes (Unsigned)
2 rx's given to patient and info regarding shingles. Advised to follow up next week if no better

## 2014-05-24 NOTE — Progress Notes (Signed)
Dr confirmed presence of shingles and gave prescription for gabapentin and acyclovir and to call back next week for appt if no better

## 2014-05-24 NOTE — Patient Instructions (Signed)
Aim for 2-3 Carb Choices per meal (30-45 grams) +/- 1 either way  No snacks between meals.  Follow High Fiber Low Fat Diet Include protein in moderation with your meals  Consider reading food labels for Total Carbohydrate, sodium and Fat Grams of foods Continue your exercise routine. Watch portion sizes and measure foods out. Bake and broil foods and avoid fried foods. Goal: Lose 1 lb per week. 2. Get Triglycerides to less than 100 in 6 months.

## 2014-05-24 NOTE — Progress Notes (Signed)
  Medical Nutrition Therapy:  Appt start time: 0800 end time:  0900.   Assessment:  Primary concerns today: overweight and possible prediabetic per patient. Semi retired. She works at [employer] part time. Exercises three times a day a few times per week. Very active. Has cut back on portions and making better choices when eating out. She does eat out a lot with friends as part of her socialization routine. Most recent A1C was 5.4%. Denies any BP problems. Wants to see Dr.Nida in [place] for her thyroid issues instead of having to see MD in [place].   Preferred Learning Style:  Auditory  Visual  Hands on  No preference indicated    Learning Readiness:  Not ready  Contemplating  Ready  Change in progress  MEDICATIONS: see list   DIETARY INTAKE:  24-hr recall:  B ( AM): 1 slice toast, 1 egg and 2 slices bacon OR 0/9-8/1 c of rasin bran cereal; uses 1% milk Snk ( AM):  Apple L ( PM):  Pork chop, pork and beans Snk ( PM): none D ( PM):  Fried shrimp, OR grilled chicken and toss salad Snk ( PM): none Beverages: water, crystal light  Usual physical activity: exercise three times per day. Walks an hour a day and does some exercise classes  Estimated energy needs: 1200calories 135 g carbohydrates 90 g protein 33 g fat  Progress Towards Goal(s):  In progress.   Nutritional Diagnosis:  NB-1.1 Food and nutrition-related knowledge deficit As related to Overweight.  As evidenced by BMI >25 .    Intervention:  Nutrition coiunseling for weight loss and heart healthy diet to help lower cholesterol levels and obtain a healthy weight.  Plan:  Aim for 2-3 Carb Choices per meal (30-45 grams) +/- 1 either way  No snacks between meals.  Follow High Fiber Low Fat Diet Include protein in moderation with your meals  Consider reading food labels for Total Carbohydrate, sodium and Fat Grams of foods Continue your exercise routine. Watch portion sizes and measure  foods out. Bake and broil foods and avoid fried foods. Goal: Lose 1 lb per week. 2. Get Triglycerides to less than 100 in 6 months.   Teaching Method Utilized: Visual Auditory Hands on  Handouts given during visit include: The Plate Method  Meal Plan Card  Barriers to learning/adherence to lifestyle change: none  Demonstrated degree of understanding via:  Teach Back   Monitoring/Evaluation:  Dietary intake, exercise, meal planning, and body weight in 3 month(s).  Referred her to the Diabetes Prevention Program at the [facility]. She would benefit from it tremendously and prevent her from developing diabetes.

## 2014-06-14 ENCOUNTER — Ambulatory Visit: Payer: BC Managed Care – PPO | Admitting: Gastroenterology

## 2014-07-13 ENCOUNTER — Ambulatory Visit: Payer: BC Managed Care – PPO | Admitting: Gastroenterology

## 2014-07-31 ENCOUNTER — Telehealth: Payer: Self-pay | Admitting: Family Medicine

## 2014-07-31 DIAGNOSIS — E039 Hypothyroidism, unspecified: Secondary | ICD-10-CM

## 2014-07-31 NOTE — Telephone Encounter (Signed)
Pt referred.

## 2014-08-10 ENCOUNTER — Ambulatory Visit (INDEPENDENT_AMBULATORY_CARE_PROVIDER_SITE_OTHER): Payer: 59 | Admitting: Gastroenterology

## 2014-08-10 ENCOUNTER — Encounter: Payer: Self-pay | Admitting: Gastroenterology

## 2014-08-10 VITALS — BP 130/84 | HR 78 | Temp 97.6°F | Ht <= 58 in | Wt 148.4 lb

## 2014-08-10 DIAGNOSIS — K589 Irritable bowel syndrome without diarrhea: Secondary | ICD-10-CM

## 2014-08-10 DIAGNOSIS — K219 Gastro-esophageal reflux disease without esophagitis: Secondary | ICD-10-CM

## 2014-08-10 MED ORDER — OMEPRAZOLE 20 MG PO CPDR: DELAYED_RELEASE_CAPSULE | ORAL | Status: DC

## 2014-08-10 NOTE — Assessment & Plan Note (Addendum)
Mixed sx triggered by CERTAIN FOODS. FAIRLY WELL CONTROLLED.  CONTINUE TO MONITOR SYMPTOMS. FOLLOW UP IN 6 MOS.

## 2014-08-10 NOTE — Progress Notes (Signed)
Subjective:    Patient ID: Kim Grimes, female    DOB: Jan 03, 1951, 64 y.o.   MRN: [id number]  [name], MD  HPI HAVING TROUBLE WITH REFLUX BUT OFF MEDS DUE TO COST. CAN'T AFFORD ACIPHEX. SEES BLOOD ON STOLL WHEN SHE'S CONSTIPATED. HAS AS SOUR STOMACH. BMs: DIARRHEA-1-2X/MO. CONSTIPATED: 1-2X/MO. FOOD CAN TRIGGER DIARRHEA AND CONSTIPATION. BURNS MID-CHEST TO UPPER ABDOMEN. MAY HAVE TROUBLE SWALLOWING OFF MEDS. TOOK ZANTAC, MILK, AND STUFF FROM WORK AND SX RESOLVED BUT CAME BACK.  PT DENIES FEVER, CHILLS, nausea, vomiting, melena, SHORTNESS OF BREATH,  CHANGE IN BOWEL IN HABITS, OR problems swallowing.   Past Medical History  Diagnosis Date  . IBS (irritable bowel syndrome) 2003  . Anxiety disorder   . Endometriosis   . Chronic pelvic pain in female   . Osteoporosis   . Helicobacter pylori gastritis     Dr. Olevia Perches   . Hiatal hernia 2003    on UGI   . GERD (gastroesophageal reflux disease)   . Obesity (BMI 30-39.9) DEC 2011 145 LBS  . PONV (postoperative nausea and vomiting)   . Coronary artery disease   . Carotid artery stenosis   . Hypothyroidism 2009   Past Surgical History  Procedure Laterality Date  . S/p hysterectomy    . Laparoscopy    . Adhesiolysis      dense adhesion between rectum and sigmoid,  & pelvis   . Salpingoophorectomy    . Rt foot sx    . Colonoscopy  03: AP/D & 08: SCREENING    WNL'S  . Upper gastrointestinal endoscopy  DEC 2010: DYSPEPSIA/DYSPHAGIA    [place] RING/ESO DIL 16 MM, GASTRITIS/DUODENITIS 2o to Aleve/on OMP PRN  . Colonoscopy  07/28/2011    sessile polyp in ascending colon/internal hemorrhoids  . Esophagogastroduodenoscopy  01/19/2012    SLF: SMALL Hiatal hernia/Mild gastritis  . Bravo ph study  01/19/2012    Procedure: BRAVO [place];  Surgeon: [name], MD;  Location: [facility];  Service: Endoscopy;;  . Eye surgery Right 10/18/2013    catarat extraction    Allergies  Allergen Reactions  . Aspirin Other (See  Comments)    Intolerant of blood thinners  . Diazepam Other (See Comments)    Aggitation    Current Outpatient Prescriptions  Medication Sig Dispense Refill  . ergocalciferol (VITAMIN D2) 50000 UNITS capsule Take 50,000 Units by mouth once a week.    . estradiol (ESTRACE) 1 MG tablet Take 1 mg by mouth daily.      Marland Kitchen levothyroxine (SYNTHROID, LEVOTHROID) 100 MCG tablet Take 100 mcg by mouth daily.    Marland Kitchen loratadine (CLARITIN) 10 MG tablet Take 1 tablet (10 mg total) by mouth daily. 30 tablet 2  .      . acyclovir (ZOVIRAX) 800 MG tablet Take 1 tablet (800 mg total) by mouth 5 (five) times daily. (Patient not taking: Reported on 08/10/2014) 50 tablet 0   Review of Systems     Objective:   Physical Exam  Constitutional: She is oriented to person, place, and time. She appears well-developed and well-nourished. No distress.  HENT:  Head: Normocephalic and atraumatic.  Mouth/Throat: Oropharynx is clear and moist. No oropharyngeal exudate.  Eyes: Pupils are equal, round, and reactive to light. No scleral icterus.  Neck: Normal range of motion. Neck supple.  Cardiovascular: Normal rate, regular rhythm and normal heart sounds.   Pulmonary/Chest: Effort normal and breath sounds normal. No respiratory distress.  Abdominal: Soft. Bowel sounds are  normal. She exhibits no distension. There is tenderness. There is no rebound and no guarding.  MODERATE TTP IN THE EPIGASTRIUM, MILD BLQs TTP    Musculoskeletal: She exhibits no edema.  Lymphadenopathy:    She has no cervical adenopathy.  Neurological: She is alert and oriented to person, place, and time.  Psychiatric: She has a normal mood and affect.  Vitals reviewed.         Assessment & Plan:

## 2014-08-10 NOTE — Patient Instructions (Signed)
CONTINUE OMEPRAZOLE.  TAKE 30 MINUTES PRIOR TO YOUR MEALS TWICE DAILY.  AVOID FOOD THAT TRIGGERS YOUR HEARTBURN. SEE INFO BELOW.  CONTINUE YOUR WEIGHT LOSS EFFORTS.  FOLLOW UP IN 6 MOS.     Lifestyle and home remedies TO MANAGE REFLUX/CHEST PAIN  You may eliminate or reduce the frequency of heartburn by making the following lifestyle changes:  . Control your weight. Being overweight is a major risk factor for heartburn and GERD. Excess pounds put pressure on your abdomen, pushing up your stomach and causing acid to back up into your esophagus.   . Eat smaller meals. 4 TO 6 MEALS A DAY. This reduces pressure on the lower esophageal sphincter, helping to prevent the valve from opening and acid from washing back into your esophagus.   Dolphus Jenny your belt. Clothes that fit tightly around your waist put pressure on your abdomen and the lower esophageal sphincter.  . Eliminate heartburn triggers. Everyone has specific triggers. Common triggers such as fatty or fried foods, spicy food, tomato sauce, carbonated beverages, alcohol, chocolate, mint, garlic, onion, caffeine and nicotine may make heartburn worse.   Marland Kitchen Avoid stooping or bending. Tying your shoes is OK. Bending over for longer periods to weed your garden isn't, especially soon after eating.   . Don't lie down after a meal. Wait at least three to four hours after eating before going to bed, and don't lie down right after eating.   Marland Kitchen PUT THE HEAD OF YOUR BED ON 6 INCH BLOCKS.   Alternative medicine . Several home remedies exist for treating GERD, but they provide only temporary relief. They include drinking baking soda (sodium bicarbonate) added to water or drinking other fluids such as baking soda mixed with cream of tartar and water.  . Although these liquids create temporary relief by neutralizing, washing away or buffering acids, eventually they aggravate the situation by adding gas and fluid to your stomach, increasing pressure  and causing more acid reflux. Further, adding more sodium to your diet may increase your blood pressure and add stress to your heart, and excessive bicarbonate ingestion can alter the acid-base balance in your body.

## 2014-08-10 NOTE — Assessment & Plan Note (Addendum)
SX UNCONTROLLED OFF MEDS. PT DOES NOT WANT TO PAY $200 OR $80 FOR 30 PILLS.   CONTINUE OMEPRAZOLE.  TAKE 30 MINUTES PRIOR TO YOUR MEALS TWICE DAILY. LOW FAT DIET CONTINUE YOUR WEIGHT LOSS EFFORTS. FOLLOW UP IN 6 MOS.

## 2014-08-11 NOTE — Progress Notes (Signed)
cc'ed to pcp °

## 2014-08-15 NOTE — Progress Notes (Signed)
ON RECALL LIST  °

## 2014-08-24 ENCOUNTER — Encounter: Payer: 59 | Attending: "Endocrinology | Admitting: Nutrition

## 2014-09-06 ENCOUNTER — Ambulatory Visit: Payer: BC Managed Care – PPO | Admitting: Family Medicine

## 2014-12-19 ENCOUNTER — Telehealth: Payer: Self-pay | Admitting: *Deleted

## 2014-12-19 NOTE — Telephone Encounter (Signed)
Pt called stating she needs her medication to go to [facility] in [place].

## 2014-12-21 ENCOUNTER — Other Ambulatory Visit: Payer: Self-pay

## 2014-12-21 DIAGNOSIS — J309 Allergic rhinitis, unspecified: Secondary | ICD-10-CM

## 2014-12-21 MED ORDER — LORATADINE 10 MG PO TABS: 10.0000 mg | ORAL_TABLET | Freq: Every day | ORAL | Status: DC

## 2014-12-21 NOTE — Telephone Encounter (Signed)
Loratadine refilled to [facility]

## 2015-01-03 ENCOUNTER — Encounter: Payer: Self-pay | Admitting: Gastroenterology

## 2015-03-08 ENCOUNTER — Telehealth: Payer: Self-pay | Admitting: Family Medicine

## 2015-03-08 NOTE — Telephone Encounter (Signed)
Patient is calling asking for an appointment today, left elbow is swollen has fluid in it, also left foot hurts and she can barley walk on it, please advise?

## 2015-03-09 NOTE — Telephone Encounter (Signed)
Spoke with patient and she states that cyst on elbow is now gone after trying warm compresses.  She she thinks that problem in her foot is coming from wearing the wrong type of shoe.  She will continue to take Ibuprofen and see if that helps.

## 2015-08-31 ENCOUNTER — Ambulatory Visit (INDEPENDENT_AMBULATORY_CARE_PROVIDER_SITE_OTHER): Payer: BLUE CROSS/BLUE SHIELD | Admitting: Family Medicine

## 2015-08-31 ENCOUNTER — Encounter: Payer: Self-pay | Admitting: Family Medicine

## 2015-08-31 VITALS — BP 126/80 | HR 76 | Resp 18 | Ht 58.5 in | Wt 152.0 lb

## 2015-08-31 DIAGNOSIS — M25522 Pain in left elbow: Secondary | ICD-10-CM | POA: Diagnosis not present

## 2015-08-31 DIAGNOSIS — M25422 Effusion, left elbow: Secondary | ICD-10-CM | POA: Diagnosis not present

## 2015-08-31 DIAGNOSIS — E038 Other specified hypothyroidism: Secondary | ICD-10-CM | POA: Diagnosis not present

## 2015-08-31 DIAGNOSIS — M79672 Pain in left foot: Secondary | ICD-10-CM

## 2015-08-31 DIAGNOSIS — M7022 Olecranon bursitis, left elbow: Secondary | ICD-10-CM | POA: Insufficient documentation

## 2015-08-31 MED ORDER — GABAPENTIN 100 MG PO CAPS: 100.0000 mg | ORAL_CAPSULE | Freq: Every day | ORAL | Status: DC

## 2015-08-31 NOTE — Progress Notes (Signed)
   Subjective:    Patient ID: Kim Grimes, female    DOB: 15-Jul-1950, 65 y.o.   MRN: [id number]  HPI   Kim Grimes     MRN: [id number]      DOB: [birth year]   HPI [name] is here for follow up and re-evaluation of chronic medical conditions, medication management and review of any available recent lab and radiology data.  Preventive health is updated, specifically  Cancer screening and Immunization.   Questions or concerns regarding consultations or procedures which the PT has had in the interim are  Addressed.Recently had labs done through another Practrice The PT denies any adverse reactions to current medications since the last visit.  6 month h/o left elbow swelling also 6 month h/o intermittent left foot swelling with numbness, fluctuates in size, no known inciting trauma,   ROS Denies recent fever or chills. Denies sinus pressure, nasal congestion, ear pain or sore throat. Denies chest congestion, productive cough or wheezing. Denies chest pains, palpitations and leg swelling Denies abdominal pain, nausea, vomiting,diarrhea or constipation.   Denies dysuria, frequency, hesitancy or incontinence.  Denies headaches, seizures, numbness, or tingling. Denies depression, anxiety or insomnia. Denies skin break down or rash.   PE  BP 126/80 mmHg  Pulse 76  Resp 18  Ht 4' 10.5" (1.486 m)  Wt 152 lb (68.947 kg)  BMI 31.22 kg/m2  SpO2 100%  Patient alert and oriented and in no cardiopulmonary distress.  HEENT: No facial asymmetry, EOMI,   oropharynx pink and moist.  Neck supple no JVD, no mass.  Chest: Clear to auscultation bilat  CVS: S1, S2 no murmurs, no S3.Regular rate.  ABD: Soft non tender.   Ext: No edema. Normal exam of left foot, no erythema, warmth or tenderness, normal pulse MS: Adequate ROM spine, shoulders, hips and knees.Cystic swelling on left elbow, tender to deep palpation  Skin: Intact, no ulcerations or rash noted.  Psych: Good eye  contact, normal affect. Memory intact not anxious or depressed appearing.  CNS: CN 2-12 intact, power,  normal throughout.no focal deficits noted.   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

## 2015-08-31 NOTE — Patient Instructions (Signed)
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

## 2015-09-02 NOTE — Assessment & Plan Note (Signed)
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

## 2015-09-02 NOTE — Assessment & Plan Note (Signed)
6 monht h/o left elbow lesion, reports fluctuation in size, refer ortho for further eval and management

## 2015-09-02 NOTE — Assessment & Plan Note (Signed)
Managed by endo

## 2015-09-06 ENCOUNTER — Telehealth: Payer: Self-pay | Admitting: Family Medicine

## 2015-09-06 MED ORDER — BENZONATATE 100 MG PO CAPS: 100.0000 mg | ORAL_CAPSULE | Freq: Two times a day (BID) | ORAL | Status: DC | PRN

## 2015-09-06 NOTE — Telephone Encounter (Signed)
Patient is asking if a nurse to return her call

## 2015-09-06 NOTE — Telephone Encounter (Signed)
Patient asking for mediation for cough.    Verbal order received for tessalon perles. Medication sent to pharmacy.

## 2015-09-24 ENCOUNTER — Ambulatory Visit (INDEPENDENT_AMBULATORY_CARE_PROVIDER_SITE_OTHER): Payer: BLUE CROSS/BLUE SHIELD

## 2015-09-24 ENCOUNTER — Ambulatory Visit (INDEPENDENT_AMBULATORY_CARE_PROVIDER_SITE_OTHER): Payer: BLUE CROSS/BLUE SHIELD | Admitting: Orthopedic Surgery

## 2015-09-24 VITALS — BP 128/81 | Ht <= 58 in | Wt 151.0 lb

## 2015-09-24 DIAGNOSIS — M25522 Pain in left elbow: Secondary | ICD-10-CM

## 2015-09-24 DIAGNOSIS — M7022 Olecranon bursitis, left elbow: Secondary | ICD-10-CM | POA: Diagnosis not present

## 2015-09-24 NOTE — Progress Notes (Signed)
Chief Complaint  Patient presents with  . Elbow Pain    Elbow pain, referred by Dr. Moshe Cipro.   HPI this patient thought she had a mass first did see a mass over her left elbow it came on spontaneously resolved with the compressive warm pack. Location left elbow. Initially had some pain which was mild. Quality of the pain was dull ache. Timing initially constant now intermittent context no trauma  Review of Systems  Constitutional: Negative for malaise/fatigue.  Respiratory: Positive for cough.   Cardiovascular: Positive for leg swelling.  Musculoskeletal: Positive for joint pain.  Endo/Heme/Allergies: Positive for environmental allergies.    Past Medical History  Diagnosis Date  . IBS (irritable bowel syndrome) 2003  . Anxiety disorder   . Endometriosis   . Chronic pelvic pain in female   . Osteoporosis   . Helicobacter pylori gastritis     Dr. Olevia Perches   . Hiatal hernia 2003    on UGI   . GERD (gastroesophageal reflux disease)   . Obesity (BMI 30-39.9) DEC 2011 145 LBS  . PONV (postoperative nausea and vomiting)   . Coronary artery disease   . Carotid artery stenosis   . Hypothyroidism 2009    Past Surgical History  Procedure Laterality Date  . S/p hysterectomy    . Laparoscopy    . Adhesiolysis      dense adhesion between rectum and sigmoid,  & pelvis   . Salpingoophorectomy    . Rt foot sx    . Colonoscopy  03: AP/D & 08: SCREENING    WNL'S  . Upper gastrointestinal endoscopy  DEC 2010: DYSPEPSIA/DYSPHAGIA    [place] RING/ESO DIL 16 MM, GASTRITIS/DUODENITIS 2o to Aleve/on OMP PRN  . Colonoscopy  07/28/2011    sessile polyp in ascending colon/internal hemorrhoids  . Esophagogastroduodenoscopy  01/19/2012    SLF: SMALL Hiatal hernia/Mild gastritis  . Bravo ph study  01/19/2012    Procedure: BRAVO [place];  Surgeon: [name], MD;  Location: [facility];  Service: Endoscopy;;  . Eye surgery Right 10/18/2013    catarat extraction   Family History  Problem  Relation Age of Onset  . Prostate cancer Father   . Colon cancer Father     < 76 YO  . Cancer Father     prostate and colon  . Pancreatic cancer Mother   . Cancer Mother     pancreas  . Diabetes Mother   . Diabetes Brother   . Colon polyps Neg Hx    Social History  Substance Use Topics  . Smoking status: Never Smoker   . Smokeless tobacco: Not on file  . Alcohol Use: Yes     Comment: occasional, maybe once/year    Current outpatient prescriptions:  .  benzonatate (TESSALON PERLES) 100 MG capsule, Take 1 capsule (100 mg total) by mouth 2 (two) times daily as needed for cough., Disp: 14 capsule, Rfl: 0 .  gabapentin (NEURONTIN) 100 MG capsule, Take 1 capsule (100 mg total) by mouth at bedtime., Disp: 30 capsule, Rfl: 4 .  levothyroxine (SYNTHROID, LEVOTHROID) 100 MCG tablet, Take 100 mcg by mouth daily., Disp: , Rfl:  .  ranitidine (ZANTAC) 150 MG tablet, Take 150 mg by mouth 2 (two) times daily., Disp: , Rfl:   BP 128/81 mmHg  Ht 4\' 10"  (1.473 m)  Wt 151 lb (68.493 kg)  BMI 31.57 kg/m2  Physical Exam  Constitutional: She is oriented to person, place, and time. She appears well-developed and well-nourished.  No distress.  Cardiovascular: Normal rate and intact distal pulses.   Neurological: She is alert and oriented to person, place, and time. She has normal reflexes. She exhibits normal muscle tone. Coordination normal.  Skin: Skin is warm and dry. No rash noted. She is not diaphoretic. No erythema. No pallor.  Psychiatric: She has a normal mood and affect. Her behavior is normal. Judgment and thought content normal.    Ortho Exam Elbow exam. Inspection slight prominence left elbow with tenderness. Also has some tenderness in the biceps and triceps. Full range of motion painless elbow is stable to varus valgus stress test. Strength normal extension triceps and flexion biceps. Skin looks clean dry intact no erythema. Good distal pulses normal temperature without edema in the  hand and wrist. Epitrochlear lymph nodes negative sensation in the hand is normal  ASSESSMENT: My personal interpretation of the images:  X-ray show very minimal olecranon spur    PLAN Encounter Diagnoses  Name Primary?  . Elbow pain, left Yes  . Olecranon bursitis, left     Recommend cautious propping of the elbow on anything hard substance such as elbow rest and chair rest.  Call us if it swells again for aspiration

## 2015-09-24 NOTE — Patient Instructions (Addendum)
Ice as needed  Call if it swells again Elbow Bursitis Elbow bursitis is inflammation of the fluid-filled sac (bursa) between the tip of your elbow bone (olecranon) and your skin. Elbow bursitis may also be called olecranon bursitis. Normally, the olecranon bursa has only a small amount of fluid in it to cushion and protect your elbow bone. Elbow bursitis causes fluid to build up inside the bursa. Over time, this swelling and inflammation can cause pain when you bend or lean on your elbow.  CAUSES Elbow bursitis may be caused by:   Elbow injury (acute trauma).  Leaning on hard surfaces for long periods of time.  Infection from an injury that breaks the skin near your elbow.  A bone growth (spur) that forms at the tip of your elbow.  A medical condition that causes inflammation in your body, such as gout or rheumatoid arthritis.  The cause may also be unknown.  SIGNS AND SYMPTOMS  The first sign of elbow bursitis is usually swelling over the tip of your elbow. This can grow to be the size of a golf ball. This may start suddenly or develop gradually. You may also have:  Pain when bending or leaning on your elbow.  Restricted movement of your elbow.  If your bursitis is caused by an infection, symptoms may also include:  Redness, warmth, and tenderness of the elbow.  Drainage of pus from the swollen area over your elbow, if the skin breaks open. DIAGNOSIS  Your health care provider may be able to diagnose elbow bursitis based on your signs and symptoms, especially if you have recently been injured. Your health care provider will also do a physical exam. This may include:  X-rays to look for a bone spur or a bone fracture.  Draining fluid from the bursa to test it for infection.  Blood tests to rule out gout or rheumatoid arthritis. TREATMENT  Treatment for elbow bursitis depends on the cause. Treatment may include:  Medicines. These may include:  Over-the-counter medicines to  relieve pain and inflammation.  Antibiotic medicines to fight infection.  Injections of anti-inflammatory medicines (steroids).  Wrapping your elbow with a bandage.  Draining fluid from the bursa.  Wearing elbow pads.  If your bursitis does not get better with treatment, surgery may be needed to remove the bursa.  HOME CARE INSTRUCTIONS   Take medicines only as directed by your health care provider.  If you were prescribed an antibiotic medicine, finish all of it even if you start to feel better.  If your bursitis is caused by an injury, rest your elbow and wear your bandage as directed by your health care provider. You may alsoapply ice to the injured area as directed by your health care provider:  Put ice in a plastic bag.  Place a towel between your skin and the bag.  Leave the ice on for 20 minutes, 2-3 times per day.  Avoid any activities that cause elbow pain.  Use elbow pads or elbow wraps to cushion your elbow. SEEK MEDICAL CARE IF:  You have a fever.   Your symptoms do not get better with treatment.  Your pain or swelling gets worse.  Your elbow pain or swelling goes away and then returns.  You have drainage of pus from the swollen area over your elbow.   This information is not intended to replace advice given to you by your health care provider. Make sure you discuss any questions you have with your health care provider.  Document Released: 07/16/2006 Document Revised: 07/07/2014 Document Reviewed: 02/22/2014 Elsevier Interactive Patient Education [employer].

## 2015-10-04 ENCOUNTER — Other Ambulatory Visit: Payer: Self-pay | Admitting: "Endocrinology

## 2015-10-07 LAB — TSH: TSH: 6.9 u[IU]/mL — ABNORMAL HIGH (ref 0.450–4.500)

## 2015-10-07 LAB — T4F+T3FREE
Free T-3: 2.5 pg/mL
Free Thyroxine: 1.36 ng/dL
Thyroxine (T4): 8 ug/dL
Triiodothyronine (T-3), Serum: 91 ng/dL

## 2015-10-15 ENCOUNTER — Ambulatory Visit (INDEPENDENT_AMBULATORY_CARE_PROVIDER_SITE_OTHER): Payer: BLUE CROSS/BLUE SHIELD | Admitting: "Endocrinology

## 2015-10-15 ENCOUNTER — Encounter: Payer: Self-pay | Admitting: "Endocrinology

## 2015-10-15 VITALS — BP 121/83 | HR 80 | Ht <= 58 in | Wt 153.0 lb

## 2015-10-15 DIAGNOSIS — E038 Other specified hypothyroidism: Secondary | ICD-10-CM

## 2015-10-15 DIAGNOSIS — R7302 Impaired glucose tolerance (oral): Secondary | ICD-10-CM | POA: Diagnosis not present

## 2015-10-15 MED ORDER — LEVOTHYROXINE SODIUM 112 MCG PO TABS: 112.0000 ug | ORAL_TABLET | Freq: Every day | ORAL | Status: DC

## 2015-10-15 NOTE — Progress Notes (Signed)
Subjective:    Patient ID: Kim Grimes, female    DOB: [birth year], PCP Kim Nakayama, MD   Past Medical History  Diagnosis Date  . IBS (irritable bowel syndrome) 2003  . Anxiety disorder   . Endometriosis   . Chronic pelvic pain in female   . Osteoporosis   . Helicobacter pylori gastritis     Dr. Olevia Perches   . Hiatal hernia 2003    on UGI   . GERD (gastroesophageal reflux disease)   . Obesity (BMI 30-39.9) DEC 2011 145 LBS  . PONV (postoperative nausea and vomiting)   . Coronary artery disease   . Carotid artery stenosis   . Hypothyroidism 2009   Past Surgical History  Procedure Laterality Date  . S/p hysterectomy    . Laparoscopy    . Adhesiolysis      dense adhesion between rectum and sigmoid,  & pelvis   . Salpingoophorectomy    . Rt foot sx    . Colonoscopy  03: AP/D & 08: SCREENING    WNL'S  . Upper gastrointestinal endoscopy  DEC 2010: DYSPEPSIA/DYSPHAGIA    [place] RING/ESO DIL 16 MM, GASTRITIS/DUODENITIS 2o to Aleve/on OMP PRN  . Colonoscopy  07/28/2011    sessile polyp in ascending colon/internal hemorrhoids  . Esophagogastroduodenoscopy  01/19/2012    SLF: SMALL Hiatal hernia/Mild gastritis  . Bravo ph study  01/19/2012    Procedure: BRAVO [place];  Surgeon: [name], MD;  Location: [facility];  Service: Endoscopy;;  . Eye surgery Right 10/18/2013    catarat extraction   Social History   Social History  . Marital Status: Divorced    Spouse Name: N/A  . Number of Children: N/A  . Years of Education: N/A   Occupational History  . fulltime at [employer] History Main Topics  . Smoking status: Never Smoker   . Smokeless tobacco: None  . Alcohol Use: Yes     Comment: occasional, maybe once/year  . Drug Use: No  . Sexual Activity: Not Asked   Other Topics Concern  . None   Social History Narrative   Outpatient Encounter Prescriptions as of 10/15/2015  Medication Sig  . benzonatate (TESSALON PERLES) 100 MG  capsule Take 1 capsule (100 mg total) by mouth 2 (two) times daily as needed for cough.  . gabapentin (NEURONTIN) 100 MG capsule Take 1 capsule (100 mg total) by mouth at bedtime.  Marland Kitchen levothyroxine (SYNTHROID, LEVOTHROID) 112 MCG tablet Take 1 tablet (112 mcg total) by mouth daily.  . ranitidine (ZANTAC) 150 MG tablet Take 150 mg by mouth 2 (two) times daily.  . [DISCONTINUED] levothyroxine (SYNTHROID, LEVOTHROID) 100 MCG tablet Take 100 mcg by mouth daily.   No facility-administered encounter medications on file as of 10/15/2015.   ALLERGIES: Allergies  Allergen Reactions  . Aspirin Other (See Comments)    Intolerant of blood thinners  . Diazepam Other (See Comments)    Aggitation   VACCINATION STATUS: Immunization History  Administered Date(s) Administered  . Influenza,inj,Quad PF,36+ Mos 04/13/2013, 05/09/2014, 03/23/2015  . Tdap 09/18/2011  . Zoster 09/18/2011    HPI   65 yr old female with medical hx as above. She is here to f/u with repeat labs. -Unfortunately she missed her several appointments and states that she ran out of her thyroid hormone for 1 month. She feels fatigued, denies cold intolerance. Her repeat thyroid function tests shows significantly elevated TSH of 6.9.  she was kept on 100 g of levothyroxine last visit.  she denies goiter. she has family hx of hypothyroidism. she denies any exposure to neck radiation. she c/o recent gain of 9 lbs, and fatigue.    Review of Systems Constitutional: + weight gain, + fatigue, no subjective hyperthermia/hypothermia Eyes: no blurry vision, no xerophthalmia ENT: no sore throat, no nodules palpated in throat, no dysphagia/odynophagia, no hoarseness Cardiovascular: no CP/SOB/palpitations/leg swelling Respiratory: no cough/SOB Gastrointestinal: no N/V/D/C Musculoskeletal: no muscle/joint aches Skin: no rashes Neurological: no tremors/numbness/tingling/dizziness Psychiatric: no depression/anxiety  Objective:    BP  121/83 mmHg  Pulse 80  Ht 4\' 10"  (1.473 m)  Wt 153 lb (69.4 kg)  BMI 31.99 kg/m2  SpO2 96%  Wt Readings from Last 3 Encounters:  10/15/15 153 lb (69.4 kg)  09/24/15 151 lb (68.493 kg)  08/31/15 152 lb (68.947 kg)    Physical Exam Constitutional: in NAD Eyes: PERRLA, EOMI, no exophthalmos ENT: moist mucous membranes, no thyromegaly, no cervical lymphadenopathy Cardiovascular: RRR, No MRG Respiratory: CTA B Gastrointestinal: abdomen soft, NT, ND, BS+ Musculoskeletal: no deformities, strength intact in all 4 Skin: moist, warm, no rashes Neurological: no tremor with outstretched hands, DTR normal in all 4     Recent Results (from the past 2160 hour(s))  T4F+T3Free     Status: None   Collection Time: 10/04/15  4:50 PM  Result Value Ref Range   Thyroxine (T4) 8.0 ug/dL    Comment: Reference Range: Adults: 4.2 - 13.0    Free T-3 2.5 pg/mL    Comment: Reference Range: >=20y: 2.0 - 4.4    Triiodothyronine (T-3), Serum 91 ng/dL    Comment: Reference Range: Adults: 55 - 170    Free Thyroxine 1.36 ng/dL    Comment: Reference Range: >=20y: 0.82 - 1.77   TSH     Status: Abnormal   Collection Time: 10/04/15  4:50 PM  Result Value Ref Range   TSH 6.900 (H) 0.450 - 4.500 uIU/mL     Assessment & Plan:   1. Other specified hypothyroidism 2. Noncompliance  She ran out of her thyroid hormone for a month.  She has long standing hypothyroidism , was supposed to be on daily  LT4 176mcg.  I will restart with levothyroxine 112 g by mouth every morning.  - We discussed about correct intake of levothyroxine, at fasting, with water, separated by at least 30 minutes from breakfast, and separated by more than 4 hours from calcium, iron, multivitamins, acid reflux medications (PPIs). -Patient is made aware of the fact that thyroid hormone replacement is needed for life, dose to be adjusted by periodic monitoring of thyroid function tests.  She has no clinical goiter, hence no need for  thyroid ultrasound. she will return in 4 months with repeat labs for reevaluation.    - I advised patient to maintain close follow up with Kim Nakayama, MD for primary care needs. Follow up plan: Return in about 4 months (around 02/14/2016) for underactive thyroid, follow up with pre-visit labs.  Glade Lloyd, MD Phone: [phone]  Fax: [phone]   10/15/2015, 2:54 PM

## 2015-10-17 ENCOUNTER — Encounter: Payer: Self-pay | Admitting: Gastroenterology

## 2015-10-17 ENCOUNTER — Ambulatory Visit (INDEPENDENT_AMBULATORY_CARE_PROVIDER_SITE_OTHER): Payer: BLUE CROSS/BLUE SHIELD | Admitting: Gastroenterology

## 2015-10-17 VITALS — BP 124/83 | HR 78 | Temp 96.6°F | Ht 58.5 in | Wt 150.2 lb

## 2015-10-17 DIAGNOSIS — K219 Gastro-esophageal reflux disease without esophagitis: Secondary | ICD-10-CM

## 2015-10-17 DIAGNOSIS — K589 Irritable bowel syndrome without diarrhea: Secondary | ICD-10-CM

## 2015-10-17 MED ORDER — LANSOPRAZOLE 30 MG PO CPDR
DELAYED_RELEASE_CAPSULE | ORAL | Status: DC
Start: 2015-10-17 — End: 2016-04-09

## 2015-10-17 NOTE — Progress Notes (Signed)
ON RECALL  °

## 2015-10-17 NOTE — Patient Instructions (Signed)
AVOID REFLUX TRIGGERS. SEE INFO BELOW.  DRINK WATER TO KEEP YOUR URINE LIGHT YELLOW.  FOLLOW A HIGH FIBER DIET. AVOID ITEMS THAT CAUSE BLOATING & GAS.  AFTER DEXILANT IS FINISHED, START PREVACID. TAKE 30 MINUTES PRIOR TO BREAKFAST and supper.  USE PEPCID AS NEEDED FOR BREAKTHROUGH HEARTBURN.  OPEN LINZESS CAPSULE. PLACE GRANULES IN 4 TEASPOONS OF WATER. STIR IT FOR 30 SECONDS. TAKE 1-2 TSP OF THE WATER DAILY IF NO SATISFACTORY BM THEN OPEN ANOTHER CAPSULE AND TAKE 2-3 TSP DAILY. YOU DO NOT NEED TO TAKE THE GRANULES THE MEDICINE IS IN THE WATER. IT MAY CAUSE EXPLOSIVE DIARRHEA.  FOLLOW UP IN 6 MOS.     Lifestyle and home remedies TO CONTROL REFLUX You may eliminate or reduce the frequency of heartburn by making the following lifestyle changes:  . Control your weight. Being overweight is a major risk factor for heartburn and GERD. Excess pounds put pressure on your abdomen, pushing up your stomach and causing acid to back up into your esophagus.   . Eat smaller meals. 4 TO 6 MEALS A DAY. This reduces pressure on the lower esophageal sphincter, helping to prevent the valve from opening and acid from washing back into your esophagus.   Dolphus Jenny your belt. Clothes that fit tightly around your waist put pressure on your abdomen and the lower esophageal sphincter.  . Eliminate heartburn triggers. Everyone has specific triggers.  .  Common triggers such as fatty or fried foods, spicy food, tomato sauce, carbonated beverages, alcohol, chocolate, mint, garlic, onion, caffeine and nicotine may make heartburn worse.   Marland Kitchen Avoid stooping or bending. Tying your shoes is OK. Bending over for longer periods to weed your garden isn't, especially soon after eating.   . Don't lie down after a meal. Wait at least three to four hours after eating before going to bed, and don't lie down right after eating.   Alternative medicine . Several home remedies exist for treating GERD, but they provide only  temporary relief. They include drinking baking soda (sodium bicarbonate) added to water or drinking other fluids such as baking soda mixed with cream of tartar and water. . Although these liquids create temporary relief by neutralizing, washing away or buffering acids, eventually they aggravate the situation by adding gas and fluid to your stomach, increasing pressure and causing more acid reflux. Further, adding more sodium to your diet may increase your blood pressure and add stress to your heart, and excessive bicarbonate ingestion can alter the acid-base balance in your body.

## 2015-10-17 NOTE — Progress Notes (Signed)
Subjective:    Patient ID: Kim Grimes, female    DOB: 1951-01-11, 65 y.o.   MRN: [id number]  Kim Nakayama, MD  HPI BURNING SENSATION IN UPPER ABDOMEN: 3-4X/WEEK. HAS BEEN WATCHING WHAT SHE EATS. BMs: NOT NORMAL SINCE BEFORE SHE GOT SICK. IF EATS SHE HAS RUN TO BATHROOM ~1 HR AFTERWARDS. NOT GRIPING ANYMORE. WHEN GOES IN AM: #5. WATERY STOOLS: 1-2X/WEEK W/O LINZESS. MAY HAVE REDDISH OR DARK GREEN STOOL. NG IN HER RLQ(3-4X/WEEK). TAKING PEPCID OR ZANTAC. Heartburn not controlled. Trouble with voice again. BMs: QD TO ONCE A WEEK. TAKES LINZESS WHEN SHE HAS CONSTIPATION. RLQ PAIN: TWINGE 1-2X/WEEK. Eating salads lots lately. PT DENIES FEVER/CHILLS, HEMATOCHEZIA, HEMATEMESIS, nausea, vomiting, melena, CHEST PAIN, SHORTNESS OF BREATH, CHANGE IN BOWEL IN HABITS, OR problems swallowing.  Past Medical History  Diagnosis Date  . IBS (irritable bowel syndrome) 2003  . Anxiety disorder   . Endometriosis   . Chronic pelvic pain in female   . Osteoporosis   . Helicobacter pylori gastritis     Dr. Olevia Perches   . Hiatal hernia 2003    on UGI   . GERD (gastroesophageal reflux disease)   . Obesity (BMI 30-39.9) DEC 2011 145 LBS  . PONV (postoperative nausea and vomiting)   . Coronary artery disease   . Carotid artery stenosis   . Hypothyroidism 2009   Past Surgical History  Procedure Laterality Date  . S/p hysterectomy    . Laparoscopy    . Adhesiolysis      dense adhesion between rectum and sigmoid,  & pelvis   . Salpingoophorectomy    . Rt foot sx    . Colonoscopy  03: AP/D & 08: SCREENING    WNL'S  . Upper gastrointestinal endoscopy  DEC 2010: DYSPEPSIA/DYSPHAGIA    [place] RING/ESO DIL 16 MM, GASTRITIS/DUODENITIS 2o to Aleve/on OMP PRN  . Colonoscopy  07/28/2011    sessile polyp in ascending colon/internal hemorrhoids  . Esophagogastroduodenoscopy  01/19/2012    SLF: SMALL Hiatal hernia/Mild gastritis  . Bravo ph study  01/19/2012    Procedure: BRAVO [place];  Surgeon: [name], MD;  Location: [facility];  Service: Endoscopy;;  . Eye surgery Right 10/18/2013    catarat extraction    Allergies  Allergen Reactions  . Aspirin Other (See Comments)    Intolerant of blood thinners  . Diazepam Other (See Comments)    Aggitation    Current Outpatient Prescriptions  Medication Sig Dispense Refill  . TESSALON PERLES 100 MG capsule Take 1 capsule (100 mg total) by mouth BID PRN for cough.    . famotidine (PEPCID) 20 MG tablet Take 40 mg by mouth daily.    Marland Kitchen NEURONTIN 100 MG capsule Take 1 capsule (100 mg total) by mouth at bedtime.     Marland Kitchen SYNTHROID, LEVOTHROID Take 1 tablet (112 mcg total) by mouth daily.    .       Review of Systems PER HPI OTHERWISE ALL SYSTEMS ARE NEGATIVE.    Objective:   Physical Exam  Constitutional: She is oriented to person, place, and time. She appears well-developed and well-nourished. No distress.  HENT:  Head: Normocephalic and atraumatic.  Mouth/Throat: Oropharynx is clear and moist. No oropharyngeal exudate.  Eyes: Pupils are equal, round, and reactive to light. No scleral icterus.  Neck: Normal range of motion. Neck supple.  Cardiovascular: Normal rate, regular rhythm and normal heart sounds.   Pulmonary/Chest: Effort normal and breath sounds normal. No respiratory distress.  Abdominal: Soft. Bowel sounds are normal. She exhibits no distension. There is tenderness. There is no rebound and no guarding.  MILD TTP IN THE EPIGASTRIUM, and RLQ  Musculoskeletal: She exhibits no edema.  Lymphadenopathy:    She has no cervical adenopathy.  Neurological: She is alert and oriented to person, place, and time.  NO FOCAL DEFICITS  Psychiatric: She has a normal mood and affect.  Vitals reviewed.     Assessment & Plan:

## 2015-10-17 NOTE — Assessment & Plan Note (Signed)
SYMPTOMS NOT IDEALLY CONTROLLED.  DRINK WATER TO KEEP YOUR URINE LIGHT YELLOW. OPEN LINZESS CAPSULE. PLACE GRANULES IN 4 TEASPOONS OF WATER. STIR IT FOR 30 SECONDS. TAKE 1-2 TSP OF THE WATER DAILY IF NO SATISFACTORY BM THEN OPEN ANOTHER CAPSULE AND TAKE 2-3 TSP DAILY. YOU DO NOT NEED TO TAKE THE GRANULES THE MEDICINE IS IN THE WATER. IT MAY CAUSE EXPLOSIVE DIARRHEA. FOLLOW UP IN 6 MOS.

## 2015-10-17 NOTE — Assessment & Plan Note (Signed)
SYMPTOMS NOT IDEALLY CONTROLLED ON PEPCID 40 MG DAILY.  AVOID REFLUX TRIGGERS. SEE INFO BELOW. AFTER DEXILANT IS FINISHED, START PREVACID. TAKE 30 MINUTES PRIOR TO BREAKFAST and supper. USE PEPCID AS NEEDED FOR BREAKTHROUGH HEARTBURN.  FOLLOW UP IN 6 MOS.

## 2015-10-17 NOTE — Progress Notes (Signed)
cc'ed to pcp °

## 2015-12-17 ENCOUNTER — Telehealth: Payer: Self-pay

## 2015-12-17 NOTE — Telephone Encounter (Signed)
Spoke to pt and she said she had a physical by her GYN recently and when they did a rectal and abdominal assessment she nearly went into a tizzy. She said it has never happened before. She has been having some diarrhea.\ She wanted to see Dr. Oneida Alar and she does not have anything for awhile.  I offered her an appt tomorrow morning at 8:00 Am with Neil Crouch, PA , since we had a cancellation and she said it would have to be an afternoon appt. She said for [place] to call if we have afternoon cancellations and she will call if she worsens.

## 2015-12-17 NOTE — Telephone Encounter (Signed)
Pt left Vm about an appt with Dr. Oneida Alar. I returned her call and got Vm and LMOM for a return call.

## 2015-12-17 NOTE — Telephone Encounter (Signed)
Pt left another Vm for me to call and I called and LMOM for a return call.

## 2015-12-18 NOTE — Telephone Encounter (Signed)
REVIEWED-NO ADDITIONAL RECOMMENDATIONS. 

## 2016-01-28 ENCOUNTER — Ambulatory Visit (HOSPITAL_COMMUNITY)
Admission: RE | Admit: 2016-01-28 | Discharge: 2016-01-28 | Disposition: A | Discharge status: Home / Self Care | Payer: BLUE CROSS/BLUE SHIELD | Source: Ambulatory Visit | Attending: Family Medicine | Admitting: Family Medicine

## 2016-01-28 ENCOUNTER — Ambulatory Visit (INDEPENDENT_AMBULATORY_CARE_PROVIDER_SITE_OTHER): Payer: BLUE CROSS/BLUE SHIELD | Admitting: Family Medicine

## 2016-01-28 ENCOUNTER — Encounter: Payer: Self-pay | Admitting: Family Medicine

## 2016-01-28 VITALS — BP 126/80 | HR 96 | Resp 18 | Ht 58.5 in | Wt 153.0 lb

## 2016-01-28 DIAGNOSIS — M25572 Pain in left ankle and joints of left foot: Secondary | ICD-10-CM | POA: Insufficient documentation

## 2016-01-28 DIAGNOSIS — E038 Other specified hypothyroidism: Secondary | ICD-10-CM | POA: Diagnosis not present

## 2016-01-28 DIAGNOSIS — M19072 Primary osteoarthritis, left ankle and foot: Secondary | ICD-10-CM | POA: Insufficient documentation

## 2016-01-28 DIAGNOSIS — L819 Disorder of pigmentation, unspecified: Secondary | ICD-10-CM

## 2016-01-28 DIAGNOSIS — J387 Other diseases of larynx: Secondary | ICD-10-CM

## 2016-01-28 DIAGNOSIS — K219 Gastro-esophageal reflux disease without esophagitis: Secondary | ICD-10-CM

## 2016-01-28 MED ORDER — KETOROLAC TROMETHAMINE 60 MG/2ML IM SOLN
60.0000 mg | Freq: Once | INTRAMUSCULAR | Status: AC
  Administered 2016-01-28: 60 mg via INTRAMUSCULAR

## 2016-01-28 MED ORDER — DICLOFENAC POTASSIUM 50 MG PO TABS: 50.0000 mg | ORAL_TABLET | Freq: Two times a day (BID) | ORAL | 0 refills | Status: DC

## 2016-01-28 NOTE — Patient Instructions (Addendum)
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 1. Commitment to daily physical activity for 30 to 60  minutes, if you are able to do this.  2. Commitment to wise food choices. Aim for half of your  food intake to be vegetable and fruit, one quarter starchy foods, and one quarter protein. Try to eat on a regular schedule  3 meals per day, snacking between meals should be limited to vegetables or fruits or small portions of nuts. 64 ounces of water per day is generally recommended, unless you have specific health conditions, like heart failure or kidney failure where you will need to limit fluid intake.  3. Commitment to sufficient and a  good quality of physical and mental rest daily, generally between 6 to 8 hours per day.  WITH PERSISTANCE AND PERSEVERANCE, THE IMPOSSIBLE , BECOMES THE NORM!  Thank you  for choosing [facility]. We consider it a privelige to serve you.  Delivering excellent health care in a caring and  compassionate way is our goal.  Partnering with you,  so that together we can achieve this goal is our strategy.

## 2016-01-28 NOTE — Assessment & Plan Note (Addendum)
Uncontrolled , x 1 week , remote h/o fracture to left ankle , rated over 10 now, also c/o left big toe pain, will get x ray of left ankle Toradol 60 mg im in office and short course of cataflam

## 2016-01-29 ENCOUNTER — Telehealth: Payer: Self-pay | Admitting: Family Medicine

## 2016-01-29 NOTE — Telephone Encounter (Signed)
Xray results from yesterday, patient advises foot is still swollen, please advise?

## 2016-01-30 ENCOUNTER — Telehealth: Payer: Self-pay

## 2016-01-30 ENCOUNTER — Telehealth: Payer: Self-pay | Admitting: Family Medicine

## 2016-01-30 DIAGNOSIS — M25572 Pain in left ankle and joints of left foot: Secondary | ICD-10-CM

## 2016-01-30 NOTE — Telephone Encounter (Signed)
-----   Message from Fayrene Helper, MD sent at 01/30/2016  3:45 PM EDT ----- I recommend she see ortho or podiatry for eval of foot and ankle pain, pls refer I will sign ----- Message ----- From: Eual Fines, LPN Sent: [id number]   1:31 PM To: Fayrene Helper, MD  States she is still having swelling and pain in her ankle. Symptoms sound more like gout (does report having that in the past) and she said the pain started out in her big toe before settling in her ankle area. The shot in the office hasn't helped. Please advise

## 2016-01-30 NOTE — Telephone Encounter (Signed)
[place] is calling asking for x-ray results, please advise?

## 2016-01-30 NOTE — Telephone Encounter (Signed)
Patient aware of her results 

## 2016-01-30 NOTE — Telephone Encounter (Signed)
Pt aware.

## 2016-02-03 ENCOUNTER — Encounter: Payer: Self-pay | Admitting: Family Medicine

## 2016-02-03 DIAGNOSIS — L819 Disorder of pigmentation, unspecified: Secondary | ICD-10-CM | POA: Insufficient documentation

## 2016-02-03 NOTE — Assessment & Plan Note (Signed)
Upper extremity hypopigmented spots, related to aging, no scaling, pt reassured

## 2016-02-03 NOTE — Progress Notes (Signed)
   Kim Grimes     MRN: [id number]      DOB: 1951/06/09   HPI Kim Grimes is here with acute left ankle pain, no recent but remote trauma history, states breathing makes pain worse, had similar occurrence 1 year ago which she states a [place] cured.  Has to stand a lot on the job, hopes to be permitted to sit on the job fo 1 week to help with symptom relief C/o light spots on forearms. Do not itch , no drainage ROS Denies recent fever or chills. Denies sinus pressure, nasal congestion, ear pain or sore throat. Denies chest congestion, productive cough or wheezing. Denies chest pains, palpitations and leg swelling Denies abdominal pain, nausea, vomiting,diarrhea or constipation.   Denies dysuria, frequency, hesitancy or incontinence.  Denies headaches, seizures, numbness, or tingling. Denies depression, anxiety or insomnia.   PE  BP 126/80   Pulse 96   Resp 18   Ht 4' 10.5" (1.486 m)   Wt 153 lb (69.4 kg)   SpO2 100%   BMI 31.43 kg/m   Patient alert and oriented and in no cardiopulmonary distress.Pt in pain  HEENT: No facial asymmetry, EOMI,   oropharynx pink and moist.  Neck supple no JVD, no mass.  Chest: Clear to auscultation bilaterally.  CVS: S1, S2 no murmurs, no S3.Regular rate.  ABD: Soft non tender.   Ext: No edema  MS: Adequate ROM spine, shoulders, hips and knees. Decreased ROM left ankle with swelling, deformity and tenderness Skin: Intact, no ulcerations or rash noted.  Psych: Good eye contact, normal affect. Memory intact not anxious or depressed appearing.  CNS: CN 2-12 intact, power,  normal throughout.no focal deficits noted.   Assessment & Plan  Acute left ankle pain Uncontrolled , x 1 week , remote h/o fracture to left ankle , rated over 10 now, also c/o left big toe pain, will get x ray of left ankle Toradol 60 mg im in office and short course of cataflam  LPRD (laryngopharyngeal reflux disease) Controlled, no change  in medication   Hypothyroidism Managed by endo  Skin hypopigmentation Upper extremity hypopigmented spots, related to aging, no scaling, pt reassured

## 2016-02-03 NOTE — Assessment & Plan Note (Signed)
Managed by endo

## 2016-02-03 NOTE — Assessment & Plan Note (Signed)
Controlled, no change in medication  

## 2016-02-27 ENCOUNTER — Encounter: Payer: Self-pay | Admitting: Gastroenterology

## 2016-02-28 ENCOUNTER — Ambulatory Visit: Payer: BLUE CROSS/BLUE SHIELD | Admitting: "Endocrinology

## 2016-02-28 ENCOUNTER — Other Ambulatory Visit: Payer: Self-pay | Admitting: "Endocrinology

## 2016-02-29 LAB — TSH: TSH: 2.89 u[IU]/mL (ref 0.450–4.500)

## 2016-02-29 LAB — T4, FREE: Free T4: 1.14 ng/dL (ref 0.82–1.77)

## 2016-03-12 ENCOUNTER — Other Ambulatory Visit (HOSPITAL_COMMUNITY): Payer: Self-pay | Admitting: Podiatry

## 2016-03-12 DIAGNOSIS — M8430XS Stress fracture, unspecified site, sequela: Secondary | ICD-10-CM

## 2016-03-18 ENCOUNTER — Ambulatory Visit (HOSPITAL_COMMUNITY): Payer: BLUE CROSS/BLUE SHIELD

## 2016-04-09 ENCOUNTER — Telehealth: Payer: Self-pay | Admitting: Gastroenterology

## 2016-04-09 ENCOUNTER — Ambulatory Visit (INDEPENDENT_AMBULATORY_CARE_PROVIDER_SITE_OTHER): Payer: BLUE CROSS/BLUE SHIELD | Admitting: Gastroenterology

## 2016-04-09 ENCOUNTER — Encounter: Payer: Self-pay | Admitting: Gastroenterology

## 2016-04-09 VITALS — BP 116/76 | HR 83 | Temp 97.9°F | Ht <= 58 in | Wt 152.2 lb

## 2016-04-09 DIAGNOSIS — K219 Gastro-esophageal reflux disease without esophagitis: Secondary | ICD-10-CM | POA: Diagnosis not present

## 2016-04-09 MED ORDER — PANTOPRAZOLE SODIUM 40 MG PO TBEC: 40.0000 mg | DELAYED_RELEASE_TABLET | Freq: Every day | ORAL | 3 refills | Status: DC

## 2016-04-09 NOTE — Telephone Encounter (Signed)
Spoke with the pt, she was taking otc famotidine. She is going to bring her insurance information tomorrow so LSL can look at it.

## 2016-04-09 NOTE — Telephone Encounter (Signed)
noted 

## 2016-04-09 NOTE — Telephone Encounter (Signed)
Pt was seen today at 2pm by LSL. She has called the office asking for LSL. I told her that she and nurses were with patients and I could take a message. [phone] (regarding her medicines)

## 2016-04-09 NOTE — Patient Instructions (Signed)
1. Please call let us know the name of the current antacid you were taking. 2. Stop current antacid, lansoprazole. Start pantoprazole 40 mg 30 minutes before breakfast each day. Prescription sent to pharmacy. 3. Return to the office in 6 months or call sooner if needed.  Food Choices for Gastroesophageal Reflux Disease, Adult When you have gastroesophageal reflux disease (GERD), the foods you eat and your eating habits are very important. Choosing the right foods can help ease the discomfort of GERD. WHAT GENERAL GUIDELINES DO I NEED TO FOLLOW?  Choose fruits, vegetables, whole grains, low-fat dairy products, and low-fat meat, fish, and poultry.  Limit fats such as oils, salad dressings, butter, nuts, and avocado.  Keep a food diary to identify foods that cause symptoms.  Avoid foods that cause reflux. These may be different for different people.  Eat frequent small meals instead of three large meals each day.  Eat your meals slowly, in a relaxed setting.  Limit fried foods.  Cook foods using methods other than frying.  Avoid drinking alcohol.  Avoid drinking large amounts of liquids with your meals.  Avoid bending over or lying down until 2-3 hours after eating. WHAT FOODS ARE NOT RECOMMENDED? The following are some foods and drinks that may worsen your symptoms: Vegetables Tomatoes. Tomato juice. Tomato and spaghetti sauce. Chili peppers. Onion and garlic. Horseradish. Fruits Oranges, grapefruit, and lemon (fruit and juice). Meats High-fat meats, fish, and poultry. This includes hot dogs, ribs, ham, sausage, salami, and bacon. Dairy Whole milk and chocolate milk. Sour cream. Cream. Butter. Ice cream. Cream cheese.  Beverages Coffee and tea, with or without caffeine. Carbonated beverages or energy drinks. Condiments Hot sauce. Barbecue sauce.  Sweets/Desserts Chocolate and cocoa. Donuts. Peppermint and spearmint. Fats and Oils High-fat foods, including [place] fries and  potato chips. Other Vinegar. Strong spices, such as black pepper, white pepper, red pepper, cayenne, curry powder, cloves, ginger, and chili powder. The items listed above may not be a complete list of foods and beverages to avoid. Contact your dietitian for more information.   This information is not intended to replace advice given to you by your health care provider. Make sure you discuss any questions you have with your health care provider.   Document Released: 06/16/2005 Document Revised: 07/07/2014 Document Reviewed: 04/20/2013 Elsevier Interactive Patient Education [employer].

## 2016-04-09 NOTE — Progress Notes (Addendum)
   REVIEWED-NO ADDITIONAL RECOMMENDATIONS.   Primary Care Physician: [name], MD  Primary Gastroenterologist:  Barney Drain, MD   Chief Complaint  Patient presents with  . Gastroesophageal Reflux    stomach burns and then goes up into esophagus    HPI: Kim Grimes is a 65 y.o. female hereFor follow-up. She was seen in April 2017. She has a history of GERD, IBS  Complains of heartburn up to ears. Some upper abdominal bloating. Worried about the cost of PPIs when starts medicare in January. Has not been taking lansoprazole for couple months. Has been utilizing over-the-counter acid reducers but cannot recall the name. States is not working. Still having heartburn. Has tried cut back on fried foods, coffee. Avoiding onions, tomatoes, cucumbers. Tries not to overeat. She tells me her heartburn is really bad right now, she just ate a cheeseburger and french fries. States she had to eat it because she was in a hurry. Complains of associated hoarseness. No dysphagia, odynophagia.  Sometimes stools is normal, sometimes hard or loose. Loose stools usually associated with certain types of food, always happens after [place]. Usually only 1-2 episodes of stools and that is over. Does not want to try any medication for it. Denies melena or rectal bleeding. Patient states she wants reflux under control, "the rest can wait".   Current Outpatient Prescriptions  Medication Sig Dispense Refill  . diclofenac (CATAFLAM) 50 MG tablet Take 1 tablet (50 mg total) by mouth 2 (two) times daily. 14 tablet 0  . gabapentin (NEURONTIN) 100 MG capsule Take 1 capsule (100 mg total) by mouth at bedtime. 30 capsule 4  . levothyroxine (SYNTHROID, LEVOTHROID) 112 MCG tablet Take 1 tablet (112 mcg total) by mouth daily. 30 tablet 3  . pantoprazole (PROTONIX) 40 MG tablet Take 1 tablet (40 mg total) by mouth daily. 30 tablet 3   No current facility-administered medications for this visit.     Allergies  as of 04/09/2016 - Review Complete 04/09/2016  Allergen Reaction Noted  . Aspirin Other (See Comments) 05/09/2014  . Diazepam Other (See Comments)     ROS:  General: Negative for anorexia, weight loss, fever, chills, fatigue, weakness. ENT: Negative for  difficulty swallowing , nasal congestion.See history of present illness CV: Negative for chest pain, angina, palpitations, dyspnea on exertion, peripheral edema.  Respiratory: Negative for dyspnea at rest, dyspnea on exertion, cough, sputum, wheezing.  GI: See history of present illness. GU:  Negative for dysuria, hematuria, urinary incontinence, urinary frequency, nocturnal urination.  Endo: Negative for unusual weight change.    Physical Examination:   BP 116/76   Pulse 83   Temp 97.9 F (36.6 C) (Oral)   Ht 4\' 10"  (1.473 m)   Wt 152 lb 3.2 oz (69 kg)   BMI 31.81 kg/m   General: Well-nourished, well-developed in no acute distress.  Eyes: No icterus. Mouth: Oropharyngeal mucosa moist and pink , no lesions erythema or exudate. Lungs: Clear to auscultation bilaterally.  Heart: Regular rate and rhythm, no murmurs rubs or gallops.  Abdomen: Bowel sounds are normal, nontender, nondistended, no hepatosplenomegaly or masses, no abdominal bruits or hernia , no rebound or guarding.   Extremities: No lower extremity edema. No clubbing or deformities. Neuro: Alert and oriented x 4   Skin: Warm and dry, no jaundice.   Psych: Alert and cooperative, normal mood and affect.

## 2016-04-10 ENCOUNTER — Encounter: Payer: Self-pay | Admitting: Gastroenterology

## 2016-04-10 NOTE — Progress Notes (Signed)
CC'ED TO PCP 

## 2016-04-10 NOTE — Assessment & Plan Note (Addendum)
Patient presents with complaints of refractory heartburn. No longer on lansoprazole. Was taking twice a day, started in April. She states it seems to work but she is concerned about cost come January when she switches to Medicare. When she ran out last she just follow-up over-the-counter Pepcid taking daily. I encouraged her to resume PPI therapy. We did call him pantoprazole 40 mg once daily to see if that would be beneficial for her and potentially more cost effective. She will let me know if it is more expensive. She will also call if her symptoms do not settle down. Otherwise we will see her back in 6 months.  Please note, antireflux measures were reinforced. Discussed better food choices. Handout provided.

## 2016-06-10 ENCOUNTER — Encounter: Payer: Self-pay | Admitting: Gastroenterology

## 2016-06-10 ENCOUNTER — Telehealth: Payer: Self-pay | Admitting: Gastroenterology

## 2016-06-10 NOTE — Telephone Encounter (Signed)
Per Erline Levine, please disregard, she is doing a Quarry manager.

## 2016-06-19 ENCOUNTER — Telehealth: Payer: Self-pay | Admitting: Gastroenterology

## 2016-06-19 NOTE — Telephone Encounter (Signed)
Pt received a letter that it was time for her 5 yr colonoscopy. Her last one was done January 2013. She has [employer]. Please call [phone]

## 2016-06-26 NOTE — Telephone Encounter (Signed)
LMOM for a return call.  

## 2016-07-03 NOTE — Telephone Encounter (Signed)
Pt is coming into the office in April for a follow up appointment. She would like to go ahead and have the TCS before that appointment.

## 2016-07-03 NOTE — Telephone Encounter (Addendum)
Gastroenterology Pre-Procedure Review  Request Date: Requesting Physician:   PATIENT REVIEW QUESTIONS: The patient responded to the following health history questions as indicated:    1. Diabetes Melitis: NO 2. Joint replacements in the past 12 months: NO 3. Major health problems in the past 3 months: NO 4. Has an artificial valve or MVP: NO 5. Has a defibrillator: NO 6. Has been advised in past to take antibiotics in advance of a procedure like teeth cleaning: NO 7. Family history of colon cancer: YES, FATHER 8. Alcohol Use: NO 9. History of sleep apnea: NO 10. History of coronary artery or other vascular stents placed within the last 12 months: NO    MEDICATIONS & ALLERGIES:    Patient reports the following regarding taking any blood thinners:   Plavix? NO Aspirin? NO Coumadin? NO Brilinta? NO Xarelto? NO Eliquis? NO Pradaxa? NO Savaysa? NO Effient? NO  Patient confirms/reports the following medications:  Current Outpatient Prescriptions  Medication Sig Dispense Refill  . gabapentin (NEURONTIN) 100 MG capsule Take 1 capsule (100 mg total) by mouth at bedtime. 30 capsule 4  . levothyroxine (SYNTHROID, LEVOTHROID) 112 MCG tablet Take 1 tablet (112 mcg total) by mouth daily. 30 tablet 3  . pantoprazole (PROTONIX) 40 MG tablet Take 1 tablet (40 mg total) by mouth daily. 30 tablet 3  . diclofenac (CATAFLAM) 50 MG tablet Take 1 tablet (50 mg total) by mouth 2 (two) times daily. (Patient not taking: Reported on 07/03/2016) 14 tablet 0   No current facility-administered medications for this visit.     Patient confirms/reports the following allergies:  Allergies  Allergen Reactions  . Aspirin Other (See Comments)    Intolerant of blood thinners  . Diazepam Other (See Comments)    Aggitation    No orders of the defined types were placed in this encounter.   AUTHORIZATION INFORMATION Primary Insurance: MEDICARE,  ID # [id number] ,  Group #:   Pre-Cert / Auth required:   Pre-Cert / Auth #:   Secondary Insurance: Holland Falling ,  [place] #[id number] ,  Group #:  Pre-Cert / Auth required:  Pre-Cert / Auth #:   SCHEDULE INFORMATION: Procedure has been scheduled as follows:  Date: , Time: PT WOULD LIKE TO HAVE A Friday WITH  SLF Location:   This Gastroenterology Pre-Precedure Review Form is being routed to the following provider(s):

## 2016-07-07 NOTE — Telephone Encounter (Signed)
Pt is wanting to have EGD done with her TCS

## 2016-07-08 NOTE — Telephone Encounter (Signed)
Is it OK to do the colonoscopy now?

## 2016-07-08 NOTE — Telephone Encounter (Signed)
PLEASE CALL PT. She does not need an EGD. SE HAS REFLUX BECAUSE SHE IS NOT ON APPROPRIATE THERAPY AND SHE IS NOT AVOIDING TRIGGERS LIKE CHEESEBURGERS AND FRIES. SHE NEEDS TO EAT RIGHT, AVOID REFLUX TRIGGERS AND TAKE A PPI-PREVACID 30 MG BID OR OMEPRAZOLE 20 MG BID.

## 2016-07-09 NOTE — Telephone Encounter (Signed)
tcs ok

## 2016-07-09 NOTE — Telephone Encounter (Signed)
Tried to call with no answer  

## 2016-07-09 NOTE — Telephone Encounter (Signed)
Forwarding to Ginger.  

## 2016-07-15 NOTE — Telephone Encounter (Signed)
Tried to call with no answer  

## 2016-07-21 ENCOUNTER — Other Ambulatory Visit: Payer: Self-pay

## 2016-07-21 MED ORDER — PEG 3350-KCL-NA BICARB-NACL 420 G PO SOLR: 4000.0000 mL | ORAL | 0 refills | Status: DC

## 2016-07-21 MED ORDER — NA SULFATE-K SULFATE-MG SULF 17.5-3.13-1.6 GM/177ML PO SOLN: 1.0000 | ORAL | 0 refills | Status: DC

## 2016-07-21 NOTE — Telephone Encounter (Signed)
Pt will call us back tomorrow

## 2016-07-22 ENCOUNTER — Other Ambulatory Visit: Payer: Self-pay

## 2016-07-22 DIAGNOSIS — Z1211 Encounter for screening for malignant neoplasm of colon: Secondary | ICD-10-CM

## 2016-07-22 NOTE — Telephone Encounter (Signed)
Pt is set up for TCS on 08/01/16 @ 930. Instructions are in the mail. She is aware

## 2016-07-29 ENCOUNTER — Ambulatory Visit: Payer: BLUE CROSS/BLUE SHIELD | Admitting: Family Medicine

## 2016-08-01 ENCOUNTER — Encounter (HOSPITAL_COMMUNITY): Payer: Self-pay | Admitting: *Deleted

## 2016-08-01 ENCOUNTER — Ambulatory Visit (HOSPITAL_COMMUNITY)
Admission: RE | Admit: 2016-08-01 | Discharge: 2016-08-01 | Disposition: A | Discharge status: Home / Self Care | Payer: Medicare Other | Source: Ambulatory Visit | Attending: Gastroenterology | Admitting: Gastroenterology

## 2016-08-01 ENCOUNTER — Encounter (HOSPITAL_COMMUNITY)
Admission: RE | Disposition: A | Discharge status: Home / Self Care | Payer: Self-pay | Source: Ambulatory Visit | Attending: Gastroenterology

## 2016-08-01 DIAGNOSIS — Z1211 Encounter for screening for malignant neoplasm of colon: Secondary | ICD-10-CM | POA: Diagnosis not present

## 2016-08-01 DIAGNOSIS — K219 Gastro-esophageal reflux disease without esophagitis: Secondary | ICD-10-CM | POA: Insufficient documentation

## 2016-08-01 DIAGNOSIS — Z800 Family history of malignant neoplasm of digestive organs: Secondary | ICD-10-CM | POA: Insufficient documentation

## 2016-08-01 DIAGNOSIS — K589 Irritable bowel syndrome without diarrhea: Secondary | ICD-10-CM | POA: Insufficient documentation

## 2016-08-01 DIAGNOSIS — E669 Obesity, unspecified: Secondary | ICD-10-CM | POA: Diagnosis not present

## 2016-08-01 DIAGNOSIS — K648 Other hemorrhoids: Secondary | ICD-10-CM | POA: Diagnosis not present

## 2016-08-01 DIAGNOSIS — F419 Anxiety disorder, unspecified: Secondary | ICD-10-CM | POA: Diagnosis not present

## 2016-08-01 DIAGNOSIS — I2510 Atherosclerotic heart disease of native coronary artery without angina pectoris: Secondary | ICD-10-CM | POA: Insufficient documentation

## 2016-08-01 DIAGNOSIS — D123 Benign neoplasm of transverse colon: Secondary | ICD-10-CM | POA: Insufficient documentation

## 2016-08-01 DIAGNOSIS — D122 Benign neoplasm of ascending colon: Secondary | ICD-10-CM | POA: Diagnosis not present

## 2016-08-01 DIAGNOSIS — E039 Hypothyroidism, unspecified: Secondary | ICD-10-CM | POA: Diagnosis not present

## 2016-08-01 DIAGNOSIS — Q438 Other specified congenital malformations of intestine: Secondary | ICD-10-CM | POA: Diagnosis not present

## 2016-08-01 DIAGNOSIS — Z79899 Other long term (current) drug therapy: Secondary | ICD-10-CM | POA: Insufficient documentation

## 2016-08-01 DIAGNOSIS — Z1212 Encounter for screening for malignant neoplasm of rectum: Secondary | ICD-10-CM | POA: Diagnosis not present

## 2016-08-01 DIAGNOSIS — M810 Age-related osteoporosis without current pathological fracture: Secondary | ICD-10-CM | POA: Diagnosis not present

## 2016-08-01 HISTORY — PX: COLONOSCOPY: SHX5424

## 2016-08-01 SURGERY — COLONOSCOPY
Anesthesia: Moderate Sedation

## 2016-08-01 MED ORDER — MEPERIDINE HCL 100 MG/ML IJ SOLN
INTRAMUSCULAR | Status: AC
  Filled 2016-08-01: qty 2

## 2016-08-01 MED ORDER — MEPERIDINE HCL 100 MG/ML IJ SOLN
INTRAMUSCULAR | Status: DC | PRN
  Administered 2016-08-01 (×2): 25 mg via INTRAVENOUS

## 2016-08-01 MED ORDER — MIDAZOLAM HCL 5 MG/5ML IJ SOLN
INTRAMUSCULAR | Status: DC | PRN
  Administered 2016-08-01 (×2): 2 mg via INTRAVENOUS
  Administered 2016-08-01: 1 mg via INTRAVENOUS

## 2016-08-01 MED ORDER — PROMETHAZINE HCL 25 MG/ML IJ SOLN
INTRAMUSCULAR | Status: AC
  Filled 2016-08-01: qty 1

## 2016-08-01 MED ORDER — SODIUM CHLORIDE 0.9 % IV SOLN
INTRAVENOUS | Status: DC
  Administered 2016-08-01: 1000 mL via INTRAVENOUS

## 2016-08-01 MED ORDER — PROMETHAZINE HCL 25 MG/ML IJ SOLN
INTRAMUSCULAR | Status: DC | PRN
  Administered 2016-08-01: 12.5 mg via INTRAVENOUS

## 2016-08-01 MED ORDER — MIDAZOLAM HCL 5 MG/5ML IJ SOLN
INTRAMUSCULAR | Status: AC
  Filled 2016-08-01: qty 10

## 2016-08-01 MED ORDER — SIMETHICONE 40 MG/0.6ML PO SUSP
ORAL | Status: DC | PRN
  Administered 2016-08-01: 100 mL

## 2016-08-01 NOTE — Telephone Encounter (Signed)
Reviewed

## 2016-08-01 NOTE — Op Note (Signed)
[facility] Patient Name: Kim Grimes Procedure Date: 08/01/2016 9:45 AM MRN: [id number] Date of Birth: 1951-04-20 Attending MD: Barney Drain , MD CSN: [id number] Age: 66 Admit Type: Outpatient Procedure:                Colonoscopy WITH SNARE POLYPECTOMY Indications:              Screening for colon cancer: Family history of                            colorectal cancer in distant relative(s) before age                            49 Providers:                Barney Drain, MD, Rosina Lowenstein, RN, Isabella Stalling,                            Technician Referring MD:             Norwood Levo. Simpson MD, MD Medicines:                Promethazine 12.5 mg IV, Meperidine 50 mg IV,                            Midazolam 5 mg IV Complications:            No immediate complications. Estimated Blood Loss:     Estimated blood loss: none. Procedure:                Pre-Anesthesia Assessment:                           - Prior to the procedure, a History and Physical                            was performed, and patient medications and                            allergies were reviewed. The patient's tolerance of                            previous anesthesia was also reviewed. The risks                            and benefits of the procedure and the sedation                            options and risks were discussed with the patient.                            All questions were answered, and informed consent                            was obtained. Prior Anticoagulants: The patient has  taken previous NSAID medication. ASA Grade                            Assessment: II - A patient with mild systemic                            disease. After reviewing the risks and benefits,                            the patient was deemed in satisfactory condition to                            undergo the procedure. After obtaining informed                            consent, the  colonoscope was passed under direct                            vision. Throughout the procedure, the patient's                            blood pressure, pulse, and oxygen saturations were                            monitored continuously. The [id number]                            scope was introduced through the anus and advanced                            to the the cecum, identified by appendiceal orifice                            and ileocecal valve. The ileocecal valve,                            appendiceal orifice, and rectum were photographed.                            The colonoscopy was somewhat difficult due to a                            tortuous colon. Successful completion of the                            procedure was aided by COLOWRAP. The patient                            tolerated the procedure fairly well. The quality of                            the bowel preparation was excellent. Scope In: 10:04:16 AM Scope Out: 10:27:39 AM Scope Withdrawal Time: 0 hours 17 minutes 16 seconds  Total Procedure Duration: 0 hours 23 minutes 23 seconds  Findings:      Two sessile polyps were found in the hepatic flexure and mid ascending       colon. The polyps were 3 to 5 mm in size. These polyps were removed with       a hot snare. Resection and retrieval were complete.      Internal hemorrhoids were found during retroflexion. The hemorrhoids       were moderate.      The recto-sigmoid colon and sigmoid colon were moderately redundant. Impression:               - Two 3 to 5 mm polyps at the hepatic flexure and                            in the mid ascending colon, removed with a hot                            snare. Resected and retrieved.                           - Internal hemorrhoids.                           - Redundant LEFT colon. Moderate Sedation:      Moderate (conscious) sedation was administered by the endoscopy nurse       and supervised by the  endoscopist. The following parameters were       monitored: oxygen saturation, heart rate, blood pressure, and response       to care. Total physician intraservice time was 31 minutes. Recommendation:           - High fiber diet.                           - Continue present medications.                           - Await pathology results.                           - Repeat colonoscopy in 5 years for surveillance.                           - Patient has a contact number available for                            emergencies. The signs and symptoms of potential                            delayed complications were discussed with the                            patient. Return to normal activities tomorrow.                            Written discharge instructions were provided to the  patient. Procedure Code(s):        --- Professional ---                           [phone], Colonoscopy, flexible; with removal of                            tumor(s), polyp(s), or other lesion(s) by snare                            technique                           99152, Moderate sedation services provided by the                            same physician or other qualified health care                            professional performing the diagnostic or                            therapeutic service that the sedation supports,                            requiring the presence of an independent trained                            observer to assist in the monitoring of the                            patient's level of consciousness and physiological                            status; initial 15 minutes of intraservice time,                            patient age 68 years or older                           [phone], Moderate sedation services; each additional                            15 minutes intraservice time Diagnosis Code(s):        --- Professional ---                           Z12.11,  Encounter for screening for malignant                            neoplasm of colon                           Z80.0, Family history of malignant neoplasm of  digestive organs                           D12.3, Benign neoplasm of transverse colon (hepatic                            flexure or splenic flexure)                           D12.2, Benign neoplasm of ascending colon                           K64.8, Other hemorrhoids                           Q43.8, Other specified congenital malformations of                            intestine CPT copyright 2016 American Medical Association. All rights reserved. The codes documented in this report are preliminary and upon coder review may  be revised to meet current compliance requirements. Barney Drain, MD Barney Drain, MD 08/01/2016 10:43:39 AM This report has been signed electronically. Number of Addenda: 0

## 2016-08-01 NOTE — Telephone Encounter (Addendum)
PT CALLED TO REQUEST OSMOPREP BUT SAID SHE WAS TOLD WE HAVEN'T WRITTEN FOR THAT IN A LONG TIME. LAST TCS PT HAD OSMOPREP. TOOK TRI-LYTE ANYWAY, BUT CAME TO ENDOSCOPY UNHAPPY AND STATED "I AM MAD AT YOU(DR. FIELDS). I CALLED YOUR OFFICE TO GET THE PILLS AND YOU DID NOT GIVE THEM TO ME." SHE DRAMK THE LIQUID AND WAS AGAIN NAUSEATED BECAUSE SHE WAS GIVEN THE IMPRESSION THAT THE PILLS WERE NOT AVAILABLE.Marland Kitchen

## 2016-08-01 NOTE — Discharge Instructions (Signed)
You had 2 small polyps removed. You have internal hemorrhoids.   DRINK WATER TO KEEP YOUR URINE LIGHT YELLOW.  FOLLOW A HIGH FIBER DIET. AVOID ITEMS THAT CAUSE BLOATING & GAS. SEE INFO BELOW.  YOUR BIOPSY RESULTS WILL BE AVAILABLE IN MY CHART AFTER FEB 6 AND MY OFFICE WILL CONTACT YOU IN 10-14 DAYS WITH YOUR RESULTS.   Next colonoscopy in 5 years.    Colonoscopy Care After Read the instructions outlined below and refer to this sheet in the next week. These discharge instructions provide you with general information on caring for yourself after you leave the hospital. While your treatment has been planned according to the most current medical practices available, unavoidable complications occasionally occur. If you have any problems or questions after discharge, call DR. Durene Dodge, [phone].  ACTIVITY  You may resume your regular activity, but move at a slower pace for the next 24 hours.   Take frequent rest periods for the next 24 hours.   Walking will help get rid of the air and reduce the bloated feeling in your belly (abdomen).   No driving for 24 hours (because of the medicine (anesthesia) used during the test).   You may shower.   Do not sign any important legal documents or operate any machinery for 24 hours (because of the anesthesia used during the test).    NUTRITION  Drink plenty of fluids.   You may resume your normal diet as instructed by your doctor.   Begin with a light meal and progress to your normal diet. Heavy or fried foods are harder to digest and may make you feel sick to your stomach (nauseated).   Avoid alcoholic beverages for 24 hours or as instructed.    MEDICATIONS  You may resume your normal medications.   WHAT YOU CAN EXPECT TODAY  Some feelings of bloating in the abdomen.   Passage of more gas than usual.   Spotting of blood in your stool or on the toilet paper  .  IF YOU HAD POLYPS REMOVED DURING THE COLONOSCOPY:  Eat a soft  diet IF YOU HAVE NAUSEA, BLOATING, ABDOMINAL PAIN, OR VOMITING.    FINDING OUT THE RESULTS OF YOUR TEST Not all test results are available during your visit. DR. Oneida Alar WILL CALL YOU WITHIN 14 DAYS OF YOUR PROCEDUE WITH YOUR RESULTS. Do not assume everything is normal if you have not heard from DR. Shatonya Passon, CALL HER OFFICE AT [phone].  SEEK IMMEDIATE MEDICAL ATTENTION AND CALL THE OFFICE: [phone] IF:  You have more than a spotting of blood in your stool.   Your belly is swollen (abdominal distention).   You are nauseated or vomiting.   You have a temperature over 101F.   You have abdominal pain or discomfort that is severe or gets worse throughout the day.   High-Fiber Diet A high-fiber diet changes your normal diet to include more whole grains, legumes, fruits, and vegetables. Changes in the diet involve replacing refined carbohydrates with unrefined foods. The calorie level of the diet is essentially unchanged. The Dietary Reference Intake (recommended amount) for adult males is 38 grams per day. For adult females, it is 25 grams per day. Pregnant and lactating women should consume 28 grams of fiber per day. Fiber is the intact part of a plant that is not broken down during digestion. Functional fiber is fiber that has been isolated from the plant to provide a beneficial effect in the body. PURPOSE  Increase stool bulk.   Ease  and regulate bowel movements.   Lower cholesterol.   REDUCE RISK OF COLON CANCER  INDICATIONS THAT YOU NEED MORE FIBER  Constipation and hemorrhoids.   Uncomplicated diverticulosis (intestine condition) and irritable bowel syndrome.   Weight management.   As a protective measure against hardening of the arteries (atherosclerosis), diabetes, and cancer.   GUIDELINES FOR INCREASING FIBER IN THE DIET  Start adding fiber to the diet slowly. A gradual increase of about 5 more grams (2 slices of whole-wheat bread, 2 servings of most fruits or  vegetables, or 1 bowl of high-fiber cereal) per day is best. Too rapid an increase in fiber may result in constipation, flatulence, and bloating.   Drink enough water and fluids to keep your urine clear or pale yellow. Water, juice, or caffeine-free drinks are recommended. Not drinking enough fluid may cause constipation.   Eat a variety of high-fiber foods rather than one type of fiber.   Try to increase your intake of fiber through using high-fiber foods rather than fiber pills or supplements that contain small amounts of fiber.   The goal is to change the types of food eaten. Do not supplement your present diet with high-fiber foods, but replace foods in your present diet.   INCLUDE A VARIETY OF FIBER SOURCES  Replace refined and processed grains with whole grains, canned fruits with fresh fruits, and incorporate other fiber sources. White rice, white breads, and most bakery goods contain little or no fiber.   Brown whole-grain rice, buckwheat oats, and many fruits and vegetables are all good sources of fiber. These include: broccoli, Brussels sprouts, cabbage, cauliflower, beets, sweet potatoes, white potatoes (skin on), carrots, tomatoes, eggplant, squash, berries, fresh fruits, and dried fruits.   Cereals appear to be the richest source of fiber. Cereal fiber is found in whole grains and bran. Bran is the fiber-rich outer coat of cereal grain, which is largely removed in refining. In whole-grain cereals, the bran remains. In breakfast cereals, the largest amount of fiber is found in those with "bran" in their names. The fiber content is sometimes indicated on the label.   You may need to include additional fruits and vegetables each day.   In baking, for 1 cup white flour, you may use the following substitutions:   1 cup whole-wheat flour minus 2 tablespoons.   1/2 cup white flour plus 1/2 cup whole-wheat flour.   Polyps, Colon  A polyp is extra tissue that grows inside your body.  Colon polyps grow in the large intestine. The large intestine, also called the colon, is part of your digestive system. It is a long, hollow tube at the end of your digestive tract where your body makes and stores stool.Most polyps are not dangerous. They are benign. This means they are not cancerous. But over time, some types of polyps can turn into cancer. Polyps that are smaller than a pea are usually not harmful. But larger polyps could someday become or may already be cancerous. To be safe, doctors remove all polyps and test them.   PREVENTION There is not one sure way to prevent polyps. You might be able to lower your risk of getting them if you:  Eat more fruits and vegetables and less fatty food.   Do not smoke.   Avoid alcohol.   Exercise every day.   Lose weight if you are overweight.   Eating more calcium and folate can also lower your risk of getting polyps. Some foods that are rich in calcium  are milk, cheese, and broccoli. Some foods that are rich in folate are chickpeas, kidney beans, and spinach.   Hemorrhoids Hemorrhoids are dilated (enlarged) veins around the rectum. Sometimes clots will form in the veins. This makes them swollen and painful. These are called thrombosed hemorrhoids. Causes of hemorrhoids include:  Constipation.   Straining to have a bowel movement.   HEAVY LIFTING  HOME CARE INSTRUCTIONS  Eat a well balanced diet and drink 6 to 8 glasses of water every day to avoid constipation. You may also use a bulk laxative.   Avoid straining to have bowel movements.   Keep anal area dry and clean.   Do not use a donut shaped pillow or sit on the toilet for long periods. This increases blood pooling and pain.   Move your bowels when your body has the urge; this will require less straining and will decrease pain and pressure.

## 2016-08-01 NOTE — H&P (Signed)
Primary Care Physician:  [name], MD Primary Gastroenterologist:  Dr. Oneida Alar  Pre-Procedure History & Physical: HPI:  Kim Grimes is a 66 y.o. female here for FAMILY Hx COLON CA-FDR HAD COLON CA AGE < 56.  Past Medical History:  Diagnosis Date  . Anxiety disorder   . Carotid artery stenosis   . Chronic pelvic pain in female   . Coronary artery disease   . Endometriosis   . GERD (gastroesophageal reflux disease)   . Helicobacter pylori gastritis    Dr. Olevia Perches   . Hiatal hernia 2003   on UGI   . Hypothyroidism 2009  . IBS (irritable bowel syndrome) 2003  . Obesity (BMI 30-39.9) DEC 2011 145 LBS  . Osteoporosis   . PONV (postoperative nausea and vomiting)     Past Surgical History:  Procedure Laterality Date  . ABDOMINAL HYSTERECTOMY    . adhesiolysis     dense adhesion between rectum and sigmoid,  & pelvis   . BRAVO [place] STUDY  01/19/2012   Procedure: BRAVO [place];  Surgeon: [name], MD;  [facility];  Service: Endoscopy;;  . COLONOSCOPY  03: [facility]/D & 08: SCREENING   WNL'S  . COLONOSCOPY  07/28/2011   sessile polyp in ascending colon/internal hemorrhoids. Next colonoscopy January 2018  . ESOPHAGOGASTRODUODENOSCOPY  01/19/2012   SLF: SMALL Hiatal hernia/Mild gastritis  . EYE SURGERY Right 10/18/2013   catarat extraction  . LAPAROSCOPY    . rt foot sx    . s/p hysterectomy    . SALPINGOOPHORECTOMY    . UPPER GASTROINTESTINAL ENDOSCOPY  DEC 2010: DYSPEPSIA/DYSPHAGIA   [place] RING/ESO DIL 16 MM, GASTRITIS/DUODENITIS 2o to Aleve/on OMP PRN    Prior to Admission medications   Medication Sig Start Date End Date Taking? Authorizing Provider  levothyroxine (SYNTHROID, LEVOTHROID) 112 MCG tablet Take 1 tablet (112 mcg total) by mouth daily. 10/15/15  Yes Cassandria Anger, MD  pantoprazole (PROTONIX) 40 MG tablet Take 1 tablet (40 mg total) by mouth daily. 04/09/16  Yes Mahala Menghini, PA-C  polyethylene glycol-electrolytes (TRILYTE) 420 g  solution Take 4,000 mLs by mouth as directed. 07/21/16  Yes [name], MD  Polyvinyl Alcohol (LUBRICANT DROPS OP) Apply 1 drop to eye daily as needed (dry eyes).   Yes Historical Provider, MD  Chlorphen-Pseudoephed-APAP (ALLERGY/SINUS APAP PO) Take 1 tablet by mouth daily as needed (sinus).    Historical Provider, MD  diclofenac (CATAFLAM) 50 MG tablet Take 1 tablet (50 mg total) by mouth 2 (two) times daily. Patient taking differently: Take 50 mg by mouth daily as needed (pain).  01/28/16   Fayrene Helper, MD  gabapentin (NEURONTIN) 100 MG capsule Take 1 capsule (100 mg total) by mouth at bedtime. Patient taking differently: Take 100 mg by mouth daily as needed (pain).  08/31/15   Fayrene Helper, MD    Allergies as of 07/22/2016 - Review Complete 04/09/2016  Allergen Reaction Noted  . Aspirin Other (See Comments) 05/09/2014  . Diazepam Other (See Comments)     Family History  Problem Relation Age of Onset  . Prostate cancer Father   . Colon cancer Father     < 82 YO  . Cancer Father     prostate and colon  . Pancreatic cancer Mother   . Cancer Mother     pancreas  . Diabetes Mother   . Diabetes Brother   . Colon polyps Neg Hx     Social History  Social History  . Marital status: Divorced    Spouse name: N/A  . Number of children: N/A  . Years of education: N/A   Occupational History  . fulltime at [employer] History Main Topics  . Smoking status: Never Smoker  . Smokeless tobacco: Never Used     Comment: Never smoked  . Alcohol use 0.0 oz/week     Comment: occasional, maybe once/year  . Drug use: No  . Sexual activity: Not on file   Other Topics Concern  . Not on file   Social History Narrative  . No narrative on file    Review of Systems: See HPI, otherwise negative ROS   Physical Exam: BP (!) 143/101   Pulse 77   Temp 97.9 F (36.6 C) (Oral)   Resp 15   Ht 4\' 10"  (1.473 m)   Wt 150 lb (68 kg)   SpO2 99%   BMI  31.35 kg/m  General:   Alert,  pleasant and cooperative in NAD Head:  Normocephalic and atraumatic. Neck:  Supple; Lungs:  Clear throughout to auscultation.    Heart:  Regular rate and rhythm. Abdomen:  Soft, nontender and nondistended. Normal bowel sounds, without guarding, and without rebound.   Neurologic:  Alert and  oriented x4;  grossly normal neurologically.  Impression/Plan:    FAMILY Hx COLON CA-FDR HAD COLON CA AGE < 60.  Plan: 1. TCS TODAY. DISCUSSED PROCEDURE, BENEFITS, & RISKS: < 1% chance of medication reaction, bleeding, perforation, or rupture of spleen/liver.

## 2016-08-01 NOTE — Telephone Encounter (Signed)
Routing to Ginger.  

## 2016-08-06 ENCOUNTER — Telehealth: Payer: Self-pay | Admitting: "Endocrinology

## 2016-08-06 ENCOUNTER — Encounter (HOSPITAL_COMMUNITY): Payer: Self-pay | Admitting: Gastroenterology

## 2016-08-06 DIAGNOSIS — E039 Hypothyroidism, unspecified: Secondary | ICD-10-CM

## 2016-08-06 MED ORDER — LEVOTHYROXINE SODIUM 112 MCG PO TABS: 112.0000 ug | ORAL_TABLET | Freq: Every day | ORAL | 0 refills | Status: DC

## 2016-08-06 NOTE — Telephone Encounter (Signed)
Pt last seen 09-2015. Made appt today for 08-20-16. She is requesting Levothyroxine refill. Can we send this?

## 2016-08-06 NOTE — Telephone Encounter (Signed)
Patient states she changed pharmacies and needs refill on her thyroid medication. She is now using [facility] in [place].

## 2016-08-06 NOTE — Telephone Encounter (Signed)
Refill for 1 month.

## 2016-08-11 ENCOUNTER — Telehealth: Payer: Self-pay

## 2016-08-11 MED ORDER — PROMETHAZINE HCL 12.5 MG PO TABS: 12.5000 mg | ORAL_TABLET | Freq: Four times a day (QID) | ORAL | 0 refills | Status: DC | PRN

## 2016-08-11 NOTE — Telephone Encounter (Signed)
PLEASE CALL PT. SHE SHOULD DRINK WATER TO KEEP HER URINE LIGHT YELLOW. AVOID DAIRY PRODUCTS INCLUDING YOGURT FOR 7 DAYS. USE PHENERGAN IF NEEDED FOR NAUSEA. RX SENT.

## 2016-08-11 NOTE — Telephone Encounter (Signed)
Pt called this morning because she is having some diarrhea (it is green) and she is nausea. She is has been like this for 2 days and is  wanting to know what she can do? Please advise

## 2016-08-11 NOTE — Telephone Encounter (Signed)
Pt is aware.  

## 2016-08-11 NOTE — Telephone Encounter (Signed)
LMOM to call.

## 2016-08-12 DIAGNOSIS — E039 Hypothyroidism, unspecified: Secondary | ICD-10-CM | POA: Diagnosis not present

## 2016-08-13 LAB — TSH: TSH: 5.27 u[IU]/mL — ABNORMAL HIGH (ref 0.450–4.500)

## 2016-08-13 LAB — T4, FREE: Free T4: 1.03 ng/dL (ref 0.82–1.77)

## 2016-08-20 ENCOUNTER — Encounter: Payer: Self-pay | Admitting: "Endocrinology

## 2016-08-20 ENCOUNTER — Ambulatory Visit (INDEPENDENT_AMBULATORY_CARE_PROVIDER_SITE_OTHER): Payer: Medicare Other | Admitting: "Endocrinology

## 2016-08-20 VITALS — BP 121/81 | HR 97 | Ht <= 58 in | Wt 154.0 lb

## 2016-08-20 DIAGNOSIS — E039 Hypothyroidism, unspecified: Secondary | ICD-10-CM

## 2016-08-20 MED ORDER — LEVOTHYROXINE SODIUM 112 MCG PO TABS: 112.0000 ug | ORAL_TABLET | Freq: Every day | ORAL | 6 refills | Status: DC

## 2016-08-20 NOTE — Progress Notes (Signed)
Subjective:    Patient ID: Kim Grimes, female    DOB: Sep 21, 1950, PCP [name], MD   Past Medical History:  Diagnosis Date  . Anxiety disorder   . Carotid artery stenosis   . Chronic pelvic pain in female   . Coronary artery disease   . Endometriosis   . GERD (gastroesophageal reflux disease)   . Helicobacter pylori gastritis    Dr. Olevia Perches   . Hiatal hernia 2003   on UGI   . Hypothyroidism 2009  . IBS (irritable bowel syndrome) 2003  . Obesity (BMI 30-39.9) DEC 2011 145 LBS  . Osteoporosis   . PONV (postoperative nausea and vomiting)    Past Surgical History:  Procedure Laterality Date  . ABDOMINAL HYSTERECTOMY    . adhesiolysis     dense adhesion between rectum and sigmoid,  & pelvis   . BRAVO [place] STUDY  01/19/2012   Procedure: BRAVO [place];  Surgeon: [name], MD;  [facility];  Service: Endoscopy;;  . COLONOSCOPY  03: [facility]/D & 08: SCREENING   WNL'S  . COLONOSCOPY  07/28/2011   sessile polyp in ascending colon/internal hemorrhoids. Next colonoscopy January 2018  . COLONOSCOPY N/A 08/01/2016   Procedure: COLONOSCOPY;  Surgeon: [name], MD;  [facility];  Service: Endoscopy;  Laterality: N/A;  930   . ESOPHAGOGASTRODUODENOSCOPY  01/19/2012   SLF: SMALL Hiatal hernia/Mild gastritis  . EYE SURGERY Right 10/18/2013   catarat extraction  . LAPAROSCOPY    . rt foot sx    . s/p hysterectomy    . SALPINGOOPHORECTOMY    . UPPER GASTROINTESTINAL ENDOSCOPY  DEC 2010: DYSPEPSIA/DYSPHAGIA   [place] RING/ESO DIL 16 MM, GASTRITIS/DUODENITIS 2o to Aleve/on OMP PRN   Social History   Social History  . Marital status: Divorced    Spouse name: N/A  . Number of children: N/A  . Years of education: N/A   Occupational History  . fulltime at [employer] History Main Topics  . Smoking status: Never Smoker  . Smokeless tobacco: Never Used     Comment: Never smoked  . Alcohol use 0.0 oz/week     Comment:  occasional, maybe once/year  . Drug use: No  . Sexual activity: Not Asked   Other Topics Concern  . None   Social History Narrative  . None   Outpatient Encounter Prescriptions as of 08/20/2016  Medication Sig  . Chlorphen-Pseudoephed-APAP (ALLERGY/SINUS APAP PO) Take 1 tablet by mouth daily as needed (sinus).  . diclofenac (CATAFLAM) 50 MG tablet Take 1 tablet (50 mg total) by mouth 2 (two) times daily. (Patient taking differently: Take 50 mg by mouth daily as needed (pain). )  . gabapentin (NEURONTIN) 100 MG capsule Take 1 capsule (100 mg total) by mouth at bedtime. (Patient taking differently: Take 100 mg by mouth daily as needed (pain). )  . levothyroxine (SYNTHROID, LEVOTHROID) 112 MCG tablet Take 1 tablet (112 mcg total) by mouth daily.  . pantoprazole (PROTONIX) 40 MG tablet Take 1 tablet (40 mg total) by mouth daily.  . Polyvinyl Alcohol (LUBRICANT DROPS OP) Apply 1 drop to eye daily as needed (dry eyes).  . promethazine (PHENERGAN) 12.5 MG tablet Take 1 tablet (12.5 mg total) by mouth every 6 (six) hours as needed for nausea or vomiting.  . [DISCONTINUED] levothyroxine (SYNTHROID, LEVOTHROID) 112 MCG tablet Take 1 tablet (112 mcg total) by mouth daily.   No facility-administered encounter medications  on file as of 08/20/2016.    ALLERGIES: Allergies  Allergen Reactions  . Aspirin Other (See Comments)    Intolerant of blood thinners  . Diazepam Other (See Comments)    Aggitation   VACCINATION STATUS: Immunization History  Administered Date(s) Administered  . Influenza,inj,Quad PF,36+ Mos 04/13/2013, 05/09/2014, 03/23/2015  . Tdap 09/18/2011  . Zoster 09/18/2011    HPI   66 yr old female with medical hx as above. She is here to f/u with repeat labs. -Unfortunately, once again,  she missed her several appointments and states that she ran out of her thyroid hormone for 1-2 month. She feels fatigued, denies cold intolerance. Her repeat thyroid function tests shows  significantly elevated TSH of 5.2.    she was kept on 112 g of levothyroxine last visit.  she denies goiter. she has family hx of hypothyroidism. she denies any exposure to neck radiation. she c/o Weight gain and fatigue .    Review of Systems Constitutional: + weight gain, -  fatigue, no subjective hyperthermia/hypothermia Eyes: no blurry vision, no xerophthalmia ENT: no sore throat, no nodules palpated in throat, no dysphagia/odynophagia, no hoarseness Cardiovascular: no CP/SOB/palpitations/leg swelling Respiratory: no cough/SOB Gastrointestinal: no N/V/D/C Musculoskeletal: no muscle/joint aches Skin: no rashes Neurological: no tremors/numbness/tingling/dizziness Psychiatric: no depression/anxiety  Objective:    BP 121/81   Pulse 97   Ht 4\' 10"  (1.473 m)   Wt 154 lb (69.9 kg)   BMI 32.19 kg/m   Wt Readings from Last 3 Encounters:  08/20/16 154 lb (69.9 kg)  08/01/16 150 lb (68 kg)  04/09/16 152 lb 3.2 oz (69 kg)    Physical Exam Constitutional: in NAD Eyes: PERRLA, EOMI, no exophthalmos ENT: moist mucous membranes, no thyromegaly, no cervical lymphadenopathy Cardiovascular: RRR, No MRG Respiratory: CTA B Gastrointestinal: abdomen soft, NT, ND, BS+ Musculoskeletal: no deformities, strength intact in all 4 Skin: moist, warm, no rashes Neurological: no tremor with outstretched hands, DTR normal in all 4     Recent Results (from the past 2160 hour(s))  TSH     Status: Abnormal   Collection Time: 08/12/16  2:43 PM  Result Value Ref Range   TSH 5.270 (H) 0.450 - 4.500 uIU/mL  T4, Free     Status: None   Collection Time: 08/12/16  2:43 PM  Result Value Ref Range   Free T4 1.03 0.82 - 1.77 ng/dL     Assessment & Plan:   1. Other specified hypothyroidism 2. Noncompliance  She ran out of her thyroid hormone for  1-2 month.  She has long standing hypothyroidism , Once again she is not consistent in taking her thyroid hormone.  - I will restart with  levothyroxine 112 g by mouth every morning.  - We discussed about correct intake of levothyroxine, at fasting, with water, separated by at least 30 minutes from breakfast, and separated by more than 4 hours from calcium, iron, multivitamins, acid reflux medications (PPIs). -Patient is made aware of the fact that thyroid hormone replacement is needed for life, dose to be adjusted by periodic monitoring of thyroid function tests.  She has no clinical goiter, hence no need for thyroid ultrasound. she will return in 4 months with repeat labs for reevaluation.    - I advised patient to maintain close follow up with [name], MD for primary care needs. Follow up plan: Return in about 6 months (around 02/17/2017) for follow up with pre-visit labs.  Glade Lloyd, MD Phone: [phone]  Fax: [phone]  08/20/2016, 3:35 PM

## 2016-08-21 ENCOUNTER — Other Ambulatory Visit: Payer: Self-pay

## 2016-08-21 MED ORDER — LEVOTHYROXINE SODIUM 112 MCG PO TABS: 112.0000 ug | ORAL_TABLET | Freq: Every day | ORAL | 6 refills | Status: DC

## 2016-10-01 ENCOUNTER — Encounter: Payer: Self-pay | Admitting: Gastroenterology

## 2016-10-01 ENCOUNTER — Ambulatory Visit (INDEPENDENT_AMBULATORY_CARE_PROVIDER_SITE_OTHER): Payer: Medicare Other | Admitting: Gastroenterology

## 2016-10-01 VITALS — BP 139/80 | HR 87 | Temp 97.5°F | Ht <= 58 in | Wt 153.6 lb

## 2016-10-01 DIAGNOSIS — K219 Gastro-esophageal reflux disease without esophagitis: Secondary | ICD-10-CM | POA: Diagnosis not present

## 2016-10-01 DIAGNOSIS — K580 Irritable bowel syndrome with diarrhea: Secondary | ICD-10-CM | POA: Diagnosis not present

## 2016-10-01 MED ORDER — OMEPRAZOLE 20 MG PO CPDR: 20.0000 mg | DELAYED_RELEASE_CAPSULE | Freq: Every day | ORAL | 3 refills | Status: DC

## 2016-10-01 NOTE — Progress Notes (Signed)
      Primary Care Physician: [name], MD  Primary Gastroenterologist:  Barney Drain, MD   Chief Complaint  Patient presents with  . Gastroesophageal Reflux    HPI: Kim Grimes is a 66 y.o. female here for follow up. She has h/o GERD, IBS. Since last OV she had colonoscopy. She had two simple adenomas removed. Next colonoscopy in five years.   Overall doing well. She has to take pantoprazole at night because it makes her drowsy. She has some breakthrough symptoms during the day though. Working out, trying to get into shape. Has not lost any weight and is disappointed but notes she has lost inches. No dysphagia. Stools loose in the morning. Maybe 1-2 stools per day. No nocturnal. No melena, brbpr. No abdominal pain.     Current Outpatient Prescriptions  Medication Sig Dispense Refill  . Chlorphen-Pseudoephed-APAP (ALLERGY/SINUS APAP PO) Take 1 tablet by mouth daily as needed (sinus).    . diclofenac (CATAFLAM) 50 MG tablet Take 1 tablet (50 mg total) by mouth 2 (two) times daily. (Patient taking differently: Take 50 mg by mouth daily as needed (pain). ) 14 tablet 0  . gabapentin (NEURONTIN) 100 MG capsule Take 1 capsule (100 mg total) by mouth at bedtime. (Patient taking differently: Take 100 mg by mouth daily as needed (pain). ) 30 capsule 4  . levothyroxine (SYNTHROID, LEVOTHROID) 112 MCG tablet Take 1 tablet (112 mcg total) by mouth daily. 30 tablet 6  . pantoprazole (PROTONIX) 40 MG tablet Take 1 tablet (40 mg total) by mouth daily. 30 tablet 3  . Polyvinyl Alcohol (LUBRICANT DROPS OP) Apply 1 drop to eye daily as needed (dry eyes).    . promethazine (PHENERGAN) 12.5 MG tablet Take 1 tablet (12.5 mg total) by mouth every 6 (six) hours as needed for nausea or vomiting. 20 tablet 0   No current facility-administered medications for this visit.     Allergies as of 10/01/2016 - Review Complete 10/01/2016  Allergen Reaction Noted  . Aspirin Other (See Comments)  05/09/2014  . Diazepam Other (See Comments)     ROS:  General: Negative for anorexia, weight loss, fever, chills, fatigue, weakness. ENT: Negative for hoarseness, difficulty swallowing , nasal congestion. CV: Negative for chest pain, angina, palpitations, dyspnea on exertion, peripheral edema.  Respiratory: Negative for dyspnea at rest, dyspnea on exertion, cough, sputum, wheezing.  GI: See history of present illness. GU:  Negative for dysuria, hematuria, urinary incontinence, urinary frequency, nocturnal urination.  Endo: Negative for unusual weight change.    Physical Examination:   BP 139/80   Pulse 87   Temp 97.5 F (36.4 C) (Oral)   Ht 4\' 10"  (1.473 m)   Wt 153 lb 9.6 oz (69.7 kg)   BMI 32.10 kg/m   General: Well-nourished, well-developed in no acute distress.  Eyes: No icterus. Mouth: Oropharyngeal mucosa moist and pink , no lesions erythema or exudate. Lungs: Clear to auscultation bilaterally.  Heart: Regular rate and rhythm, no murmurs rubs or gallops.  Abdomen: Bowel sounds are normal, nontender, nondistended, no hepatosplenomegaly or masses, no abdominal bruits or hernia , no rebound or guarding.   Extremities: No lower extremity edema. No clubbing or deformities. Neuro: Alert and oriented x 4   Skin: Warm and dry, no jaundice.   Psych: Alert and cooperative, normal mood and affect.

## 2016-10-01 NOTE — Progress Notes (Signed)
CC'ED TO PCP 

## 2016-10-01 NOTE — Assessment & Plan Note (Signed)
Pantoprazole causes drowsiness. Will try omeprazole 20mg  30 minutes before breakfast daily. She will let me know if too expensive. Call if any problems. Ov in six months.

## 2016-10-01 NOTE — Patient Instructions (Addendum)
1. We will look at your formulary and change your pantoprazole due to drowsiness. We will be in touch in the next couple of days.  2. Return to the office in 6 months or sooner if needed.

## 2016-10-01 NOTE — Assessment & Plan Note (Signed)
Stable. No abdominal pain. 1-2 stools daily somewhat loose. Continue to monitor. Ov in six months.

## 2016-10-01 NOTE — Addendum Note (Signed)
Addended by: Mahala Menghini on: 10/01/2016 02:27 PM   Modules accepted: Orders

## 2016-10-13 NOTE — Progress Notes (Signed)
REVIEWED-NO ADDITIONAL RECOMMENDATIONS. 

## 2016-11-13 DIAGNOSIS — L292 Pruritus vulvae: Secondary | ICD-10-CM | POA: Diagnosis not present

## 2016-11-13 DIAGNOSIS — Z124 Encounter for screening for malignant neoplasm of cervix: Secondary | ICD-10-CM | POA: Diagnosis not present

## 2016-11-13 DIAGNOSIS — Z1231 Encounter for screening mammogram for malignant neoplasm of breast: Secondary | ICD-10-CM | POA: Diagnosis not present

## 2016-11-13 DIAGNOSIS — Z01419 Encounter for gynecological examination (general) (routine) without abnormal findings: Secondary | ICD-10-CM | POA: Diagnosis not present

## 2017-01-08 ENCOUNTER — Telehealth: Payer: Self-pay | Admitting: Family Medicine

## 2017-01-08 NOTE — Telephone Encounter (Signed)
Patient calling about "bumps" on tongue.  She noticed after she ate popcorn.  She is requesting to speak with Brandi (Dr. Griffin Dakin nurse)

## 2017-01-09 NOTE — Telephone Encounter (Signed)
Called patient and left message for them to return call at the office   

## 2017-01-09 NOTE — Telephone Encounter (Signed)
appt scheduled

## 2017-01-12 ENCOUNTER — Ambulatory Visit: Payer: Medicare Other | Admitting: Family Medicine

## 2017-01-19 ENCOUNTER — Ambulatory Visit: Payer: Medicare Other | Admitting: Family Medicine

## 2017-02-18 ENCOUNTER — Encounter: Payer: Self-pay | Admitting: "Endocrinology

## 2017-02-18 ENCOUNTER — Ambulatory Visit: Payer: Medicare Other | Admitting: "Endocrinology

## 2017-03-09 ENCOUNTER — Encounter: Payer: Self-pay | Admitting: Gastroenterology

## 2017-03-26 ENCOUNTER — Other Ambulatory Visit: Payer: Self-pay

## 2017-03-27 ENCOUNTER — Other Ambulatory Visit: Payer: Self-pay | Admitting: "Endocrinology

## 2017-05-25 ENCOUNTER — Ambulatory Visit: Payer: Medicare Other | Admitting: Family

## 2017-06-15 ENCOUNTER — Ambulatory Visit: Payer: Medicare Other | Admitting: Surgery

## 2017-07-30 DIAGNOSIS — H1089 Other conjunctivitis: Secondary | ICD-10-CM | POA: Diagnosis not present

## 2017-08-14 ENCOUNTER — Other Ambulatory Visit: Payer: Self-pay

## 2017-08-14 ENCOUNTER — Encounter: Payer: Self-pay | Admitting: Family Medicine

## 2017-08-14 ENCOUNTER — Ambulatory Visit (INDEPENDENT_AMBULATORY_CARE_PROVIDER_SITE_OTHER): Payer: PPO | Admitting: Family Medicine

## 2017-08-14 VITALS — BP 114/80 | HR 84 | Temp 97.3°F | Resp 18 | Ht <= 58 in | Wt 149.0 lb

## 2017-08-14 DIAGNOSIS — H9201 Otalgia, right ear: Secondary | ICD-10-CM | POA: Diagnosis not present

## 2017-08-14 DIAGNOSIS — J069 Acute upper respiratory infection, unspecified: Secondary | ICD-10-CM | POA: Diagnosis not present

## 2017-08-14 DIAGNOSIS — R05 Cough: Secondary | ICD-10-CM | POA: Diagnosis not present

## 2017-08-14 DIAGNOSIS — R059 Cough, unspecified: Secondary | ICD-10-CM

## 2017-08-14 MED ORDER — HYDROCOD POLST-CPM POLST ER 10-8 MG/5ML PO SUER: 5.0000 mL | Freq: Two times a day (BID) | ORAL | 0 refills | Status: DC | PRN

## 2017-08-14 MED ORDER — AMOXICILLIN 875 MG PO TABS: 875.0000 mg | ORAL_TABLET | Freq: Two times a day (BID) | ORAL | 0 refills | Status: DC

## 2017-08-14 NOTE — Patient Instructions (Signed)
Push fluids Take the mucinex Dm twice a day Take the cough medicine as needed at night Use flonase twice a day Take the antibiotic as directed Call if not improving by Monday

## 2017-08-14 NOTE — Progress Notes (Signed)
Chief Complaint  Patient presents with  . Cough    x 2 weeks   Patient is here for upper respiratory symptoms.  They have been present for 2 weeks.  It started with a green discharge from her right eye with some redness.  She was treated with antibiotics by her eye doctor, and this went away.  Fairly promptly after this she developed stuffy nose, only on the right, sinus pressure, only on the right, and sore throat that is more present on the right.  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  She is coughing.  She has terrible coughing spells where she coughed to the point that she vomits.  This happens infrequently.  The cough is keeping her awake at night. No history of underlying lung disease COPD or asthma. She states that she does have some underlying allergies and takes an antihistamine. She has tried a couple of over-the-counter medicines and has not had any relief. She has not had fever chills or body aches, she has had a flu shot.  Patient Active Problem List   Diagnosis Date Noted  . Skin hypopigmentation 02/03/2016  . Acute left ankle pain 01/28/2016  . Foot pain, left 08/31/2015  . Pain and swelling of left elbow 08/31/2015  . IGT (impaired glucose tolerance) 05/09/2014  . High serum low-density lipoprotein (LDL) 05/09/2014  . Truncal obesity 05/09/2014  . Pulsatile tinnitus 05/30/2013  . Occlusion and stenosis of carotid artery without mention of cerebral infarction 05/16/2013  . Allergic rhinitis 12/09/2012  . Unspecified vitamin D deficiency 09/17/2012  . 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 Carotid artery bruit 09/18/2011  . Special screening for malignant neoplasms, colon 07/16/2011  . IBS (irritable bowel syndrome) 01/07/2011  . GERD (gastroesophageal reflux disease) 01/07/2011  . Hypothyroidism 11/09/2009    Outpatient Encounter Medications as of 08/14/2017  Medication Sig  . Chlorphen-Pseudoephed-APAP  (ALLERGY/SINUS APAP PO) Take 1 tablet by mouth daily as needed (sinus).  . diclofenac (CATAFLAM) 50 MG tablet Take 1 tablet (50 mg total) by mouth 2 (two) times daily. (Patient taking differently: Take 50 mg by mouth daily as needed (pain). )  . gabapentin (NEURONTIN) 100 MG capsule Take 1 capsule (100 mg total) by mouth at bedtime. (Patient taking differently: Take 100 mg by mouth daily as needed (pain). )  . levothyroxine (SYNTHROID, LEVOTHROID) 112 MCG tablet TAKE 1 TABLET (112 MCG TOTAL) BY MOUTH DAILY.  Marland Kitchen omeprazole (PRILOSEC) 20 MG capsule Take 1 capsule (20 mg total) by mouth daily before breakfast.  . Polyvinyl Alcohol (LUBRICANT DROPS OP) Apply 1 drop to eye daily as needed (dry eyes).  . promethazine (PHENERGAN) 12.5 MG tablet Take 1 tablet (12.5 mg total) by mouth every 6 (six) hours as needed for nausea or vomiting.  Marland Kitchen amoxicillin (AMOXIL) 875 MG tablet Take 1 tablet (875 mg total) by mouth 2 (two) times daily.  . chlorpheniramine-HYDROcodone (TUSSIONEX PENNKINETIC ER) 10-8 MG/5ML SUER Take 5 mLs by mouth every 12 (twelve) hours as needed for cough.   No facility-administered encounter medications on file as of 08/14/2017.     Allergies  Allergen Reactions  . Aspirin Other (See Comments)    Intolerant of blood thinners  . Diazepam Other (See Comments)    Aggitation    Review of Systems  Constitutional: Negative for activity change, appetite change and unexpected weight change.  HENT: Positive for ear pain, sinus pressure, sinus pain and sore throat. Negative  for dental problem, postnasal drip and rhinorrhea.   Eyes: Negative for redness and visual disturbance.  Respiratory: Positive for cough. Negative for shortness of breath and wheezing.   Cardiovascular: Negative for chest pain, palpitations and leg swelling.  Gastrointestinal: Positive for nausea and vomiting. Negative for diarrhea.  Genitourinary: Negative for difficulty urinating, frequency and menstrual problem.    Musculoskeletal: Negative for arthralgias and back pain.  Neurological: Positive for headaches. Negative for dizziness.  Psychiatric/Behavioral: Positive for sleep disturbance. Negative for dysphoric mood. The patient is not nervous/anxious.     BP 114/80 (BP Location: Left Arm, Patient Position: Sitting, Cuff Size: Normal)   Pulse 84   Temp (!) 97.3 F (36.3 C) (Temporal)   Resp 18   Ht 4\' 10"  (1.473 m)   Wt 149 lb (67.6 kg)   SpO2 96%   BMI 31.14 kg/m   Physical Exam  Constitutional: She is oriented to person, place, and time. She appears well-developed and well-nourished. No distress.  HENT:  Head: Normocephalic and atraumatic.  Right Ear: External ear normal.  Left Ear: External ear normal.  Mouth/Throat: Oropharynx is clear and moist. No oropharyngeal exudate.  Right TM is dull and mildly injected.  Mild posterior pharyngeal injection  Eyes: Conjunctivae are normal. Pupils are equal, round, and reactive to light.  Neck: Normal range of motion. Neck supple. No thyromegaly present.  Tender node at right angle of jaw  Cardiovascular: Normal rate, regular rhythm and normal heart sounds.  Pulmonary/Chest: Effort normal and breath sounds normal. No respiratory distress.  Abdominal: Soft. Bowel sounds are normal.  Musculoskeletal: Normal range of motion. She exhibits no edema.  Lymphadenopathy:    She has cervical adenopathy.  Neurological: She is alert and oriented to person, place, and time.  Gait normal  Skin: Skin is warm and dry.  Psychiatric: She has a normal mood and affect. Her behavior is normal. Thought content normal.  Nursing note and vitals reviewed.   ASSESSMENT/PLAN:  1. Cough Gave patient a stronger cough medicine to use at bedtime only.  During the day she can use her Tessalon Perles or Robitussin DM  2. Referred otalgia of right ear Ibuprofen for pain  3. Viral upper respiratory tract infection Likely viral infection to begin with.  After 2 weeks of  symptoms, and congestion only in the right sinuses I am going to cover her with antibiotics.  I think she should start using Flonase to try to open up his nasal passages.  She will call me if not better in a couple of days.   Patient Instructions  Push fluids Take the mucinex Dm twice a day Take the cough medicine as needed at night Use flonase twice a day Take the antibiotic as directed Call if not improving by Monday   Raylene Everts, MD

## 2017-12-11 ENCOUNTER — Other Ambulatory Visit: Payer: Self-pay | Admitting: Gastroenterology

## 2017-12-11 DIAGNOSIS — Z01419 Encounter for gynecological examination (general) (routine) without abnormal findings: Secondary | ICD-10-CM | POA: Diagnosis not present

## 2017-12-11 DIAGNOSIS — R300 Dysuria: Secondary | ICD-10-CM | POA: Diagnosis not present

## 2017-12-11 DIAGNOSIS — Z1231 Encounter for screening mammogram for malignant neoplasm of breast: Secondary | ICD-10-CM | POA: Diagnosis not present

## 2017-12-17 ENCOUNTER — Other Ambulatory Visit: Payer: Self-pay | Admitting: Obstetrics and Gynecology

## 2017-12-17 DIAGNOSIS — M8580 Other specified disorders of bone density and structure, unspecified site: Secondary | ICD-10-CM

## 2018-01-04 ENCOUNTER — Encounter: Payer: Self-pay | Admitting: Family Medicine

## 2018-01-11 ENCOUNTER — Telehealth: Payer: Self-pay | Admitting: Family Medicine

## 2018-01-11 NOTE — Telephone Encounter (Signed)
Pt had called and LMOM.  I called her back and did not get answer.  LMOM for her to call us back.

## 2018-01-14 ENCOUNTER — Ambulatory Visit: Payer: PPO

## 2018-01-26 ENCOUNTER — Telehealth: Payer: Self-pay

## 2018-01-26 ENCOUNTER — Telehealth: Payer: Self-pay | Admitting: Family Medicine

## 2018-01-26 DIAGNOSIS — Z13820 Encounter for screening for osteoporosis: Secondary | ICD-10-CM

## 2018-01-26 NOTE — Telephone Encounter (Signed)
Order entered for dexa scan to be done at [facility]

## 2018-01-26 NOTE — Telephone Encounter (Signed)
Order entered for dexa scan

## 2018-01-26 NOTE — Telephone Encounter (Signed)
Pt would like her Bone Scan at [facility]. Please put a order in so I can schedule, anytime after 3:30

## 2018-02-02 NOTE — Telephone Encounter (Signed)
Pt is scheduled for Friday Aug 9th , pt is aware

## 2018-02-05 ENCOUNTER — Ambulatory Visit (HOSPITAL_COMMUNITY)
Admission: RE | Admit: 2018-02-05 | Discharge: 2018-02-05 | Disposition: A | Discharge status: Home / Self Care | Payer: PPO | Source: Ambulatory Visit | Attending: Family Medicine | Admitting: Family Medicine

## 2018-02-05 DIAGNOSIS — Z13820 Encounter for screening for osteoporosis: Secondary | ICD-10-CM | POA: Diagnosis not present

## 2018-02-05 DIAGNOSIS — Z780 Asymptomatic menopausal state: Secondary | ICD-10-CM | POA: Diagnosis not present

## 2018-02-05 DIAGNOSIS — M8589 Other specified disorders of bone density and structure, multiple sites: Secondary | ICD-10-CM | POA: Insufficient documentation

## 2018-02-08 ENCOUNTER — Other Ambulatory Visit: Payer: Self-pay | Admitting: Family Medicine

## 2018-02-09 ENCOUNTER — Telehealth: Payer: Self-pay | Admitting: Family Medicine

## 2018-02-09 NOTE — Telephone Encounter (Signed)
Pt is calling you back regarding--Result Notes for DG Bone Density

## 2018-02-09 NOTE — Telephone Encounter (Signed)
Patient came into the office. Results given verbally with understanding.

## 2018-02-09 NOTE — Telephone Encounter (Signed)
Returned patients call with no answer. Left message requesting call back and asked her to let front staff know if I could leave a detailed message on her vm.

## 2018-02-15 ENCOUNTER — Ambulatory Visit (INDEPENDENT_AMBULATORY_CARE_PROVIDER_SITE_OTHER): Payer: PPO

## 2018-02-15 ENCOUNTER — Telehealth: Payer: Self-pay

## 2018-02-15 VITALS — BP 135/83 | Resp 16 | Ht <= 58 in | Wt 147.0 lb

## 2018-02-15 DIAGNOSIS — E790 Hyperuricemia without signs of inflammatory arthritis and tophaceous disease: Secondary | ICD-10-CM | POA: Diagnosis not present

## 2018-02-15 DIAGNOSIS — R7989 Other specified abnormal findings of blood chemistry: Secondary | ICD-10-CM

## 2018-02-15 DIAGNOSIS — Z23 Encounter for immunization: Secondary | ICD-10-CM | POA: Diagnosis not present

## 2018-02-15 DIAGNOSIS — Z0000 Encounter for general adult medical examination without abnormal findings: Secondary | ICD-10-CM | POA: Diagnosis not present

## 2018-02-15 DIAGNOSIS — E559 Vitamin D deficiency, unspecified: Secondary | ICD-10-CM

## 2018-02-15 DIAGNOSIS — R0989 Other specified symptoms and signs involving the circulatory and respiratory systems: Secondary | ICD-10-CM

## 2018-02-15 NOTE — Patient Instructions (Signed)
[name] , Thank you for taking time to come for your Medicare Wellness Visit. I appreciate your ongoing commitment to your health goals. Please review the following plan we discussed and let me know if I can assist you in the future.    Ask insurance if shingrix is covered  Take 1273m of calcium and 1000 IU vitamin D daily   Gets labs done fasting     Screening recommendations/referrals: Colonoscopy: Up to date  Mammogram: done  Bone Density: done  Recommended yearly ophthalmology/optometry visit for glaucoma screening and checkup Recommended yearly dental visit for hygiene and checkup  Vaccinations: Influenza vaccine: given  Pneumococcal vaccine: done  Tdap vaccine: done  Shingles vaccine: ask insurance    Advanced directives: given   Conditions/risks identified: done   Next appointment: scheduled   Preventive Care 40-64 Years, Female Preventive care refers to lifestyle choices and visits with your health care provider that can promote health and wellness. What does preventive care include?  A yearly physical exam. This is also called an annual well check.  Dental exams once or twice a year.  Routine eye exams. Ask your health care provider how often you should have your eyes checked.  Personal lifestyle choices, including:  Daily care of your teeth and gums.  Regular physical activity.  Eating a healthy diet.  Avoiding tobacco and drug use.  Limiting alcohol use.  Practicing safe sex.  Taking low-dose aspirin daily starting at age 33464  Taking vitamin and mineral supplements as recommended by your health care provider. What happens during an annual well check? The services and screenings done by your health care provider during your annual well check will depend on your age, overall health, lifestyle risk factors, and family history of disease. Counseling  Your health care provider may ask you questions about your:  Alcohol use.  Tobacco  use.  Drug use.  Emotional well-being.  Home and relationship well-being.  Sexual activity.  Eating habits.  Work and work eStatistician  Method of birth control.  Menstrual cycle.  Pregnancy history. Screening  You may have the following tests or measurements:  Height, weight, and BMI.  Blood pressure.  Lipid and cholesterol levels. These may be checked every 5 years, or more frequently if you are over 582years old.  Skin check.  Lung cancer screening. You may have this screening every year starting at age 3344if you have a 30-pack-year history of smoking and currently smoke or have quit within the past 15 years.  Fecal occult blood test (FOBT) of the stool. You may have this test every year starting at age 33433  Flexible sigmoidoscopy or colonoscopy. You may have a sigmoidoscopy every 5 years or a colonoscopy every 10 years starting at age 33433  Hepatitis C blood test.  Hepatitis B blood test.  Sexually transmitted disease (STD) testing.  Diabetes screening. This is done by checking your blood sugar (glucose) after you have not eaten for a while (fasting). You may have this done every 1-3 years.  Mammogram. This may be done every 1-2 years. Talk to your health care provider about when you should start having regular mammograms. This may depend on whether you have a family history of breast cancer.  BRCA-related cancer screening. This may be done if you have a family history of breast, ovarian, tubal, or peritoneal cancers.  Pelvic exam and Pap test. This may be done every 3 years starting at age 66 Starting at age 66 this may be  done every 5 years if you have a Pap test in combination with an HPV test.  Bone density scan. This is done to screen for osteoporosis. You may have this scan if you are at high risk for osteoporosis. Discuss your test results, treatment options, and if necessary, the need for more tests with your health care provider. Vaccines  Your  health care provider may recommend certain vaccines, such as:  Influenza vaccine. This is recommended every year.  Tetanus, diphtheria, and acellular pertussis (Tdap, Td) vaccine. You may need a Td booster every 10 years.  Zoster vaccine. You may need this after age 54.  Pneumococcal 13-valent conjugate (PCV13) vaccine. You may need this if you have certain conditions and were not previously vaccinated.  Pneumococcal polysaccharide (PPSV23) vaccine. You may need one or two doses if you smoke cigarettes or if you have certain conditions. Talk to your health care provider about which screenings and vaccines you need and how often you need them. This information is not intended to replace advice given to you by your health care provider. Make sure you discuss any questions you have with your health care provider. Document Released: 07/13/2015 Document Revised: 03/05/2016 Document Reviewed: 04/17/2015 Elsevier Interactive Patient Education  2017 [employer] Prevention in the Home Falls can cause injuries. They can happen to people of all ages. There are many things you can do to make your home safe and to help prevent falls. What can I do on the outside of my home?  Regularly fix the edges of walkways and driveways and fix any cracks.  Remove anything that might make you trip as you walk through a door, such as a raised step or threshold.  Trim any bushes or trees on the path to your home.  Use bright outdoor lighting.  Clear any walking paths of anything that might make someone trip, such as rocks or tools.  Regularly check to see if handrails are loose or broken. Make sure that both sides of any steps have handrails.  Any raised decks and porches should have guardrails on the edges.  Have any leaves, snow, or ice cleared regularly.  Use sand or salt on walking paths during winter.  Clean up any spills in your garage right away. This includes oil or grease  spills. What can I do in the bathroom?  Use night lights.  Install grab bars by the toilet and in the tub and shower. Do not use towel bars as grab bars.  Use non-skid mats or decals in the tub or shower.  If you need to sit down in the shower, use a plastic, non-slip stool.  Keep the floor dry. Clean up any water that spills on the floor as soon as it happens.  Remove soap buildup in the tub or shower regularly.  Attach bath mats securely with double-sided non-slip rug tape.  Do not have throw rugs and other things on the floor that can make you trip. What can I do in the bedroom?  Use night lights.  Make sure that you have a light by your bed that is easy to reach.  Do not use any sheets or blankets that are too big for your bed. They should not hang down onto the floor.  Have a firm chair that has side arms. You can use this for support while you get dressed.  Do not have throw rugs and other things on the floor that can make you trip. What  can I do in the kitchen?  Clean up any spills right away.  Avoid walking on wet floors.  Keep items that you use a lot in easy-to-reach places.  If you need to reach something above you, use a strong step stool that has a grab bar.  Keep electrical cords out of the way.  Do not use floor polish or wax that makes floors slippery. If you must use wax, use non-skid floor wax.  Do not have throw rugs and other things on the floor that can make you trip. What can I do with my stairs?  Do not leave any items on the stairs.  Make sure that there are handrails on both sides of the stairs and use them. Fix handrails that are broken or loose. Make sure that handrails are as long as the stairways.  Check any carpeting to make sure that it is firmly attached to the stairs. Fix any carpet that is loose or worn.  Avoid having throw rugs at the top or bottom of the stairs. If you do have throw rugs, attach them to the floor with carpet  tape.  Make sure that you have a light switch at the top of the stairs and the bottom of the stairs. If you do not have them, ask someone to add them for you. What else can I do to help prevent falls?  Wear shoes that:  Do not have high heels.  Have rubber bottoms.  Are comfortable and fit you well.  Are closed at the toe. Do not wear sandals.  If you use a stepladder:  Make sure that it is fully opened. Do not climb a closed stepladder.  Make sure that both sides of the stepladder are locked into place.  Ask someone to hold it for you, if possible.  Clearly mark and make sure that you can see:  Any grab bars or handrails.  First and last steps.  Where the edge of each step is.  Use tools that help you move around (mobility aids) if they are needed. These include:  Canes.  Walkers.  Scooters.  Crutches.  Turn on the lights when you go into a dark area. Replace any light bulbs as soon as they burn out.  Set up your furniture so you have a clear path. Avoid moving your furniture around.  If any of your floors are uneven, fix them.  If there are any pets around you, be aware of where they are.  Review your medicines with your doctor. Some medicines can make you feel dizzy. This can increase your chance of falling. Ask your doctor what other things that you can do to help prevent falls. This information is not intended to replace advice given to you by your health care provider. Make sure you discuss any questions you have with your health care provider. Document Released: 04/12/2009 Document Revised: 11/22/2015 Document Reviewed: 07/21/2014 Elsevier Interactive Patient Education  2017 [employer].

## 2018-02-15 NOTE — Progress Notes (Signed)
Subjective:   Kim Grimes is a 67 y.o. female who presents for an Initial Medicare Annual Wellness Visit.  Review of Systems         Cardiac Risk Factors include: advanced age (>64men, >70 women);obesity (BMI >30kg/m2)      Objective:    Today's Vitals   02/15/18 1610 02/15/18 1613  BP: 135/83   Resp: 16   SpO2: 98%   Weight: 147 lb (66.7 kg)   Height: 4\' 10"  (1.473 m)   PainSc: 0-No pain 0-No pain   Body mass index is 30.72 kg/m.  Advanced Directives 02/15/2018 08/01/2016 05/24/2014 01/19/2012 07/28/2011  Does Patient Have a Medical Advance Directive? No No No Patient has advance directive, copy not in chart Patient does not have advance directive  Type of Advance Directive - - - [place] -  Does patient want to make changes to medical advance directive? Yes (ED - Information included in AVS) - - - -  Would patient like information on creating a medical advance directive? - No - Patient declined No - patient declined information - -  Pre-existing out of facility DNR order (yellow form or pink MOST form) - - - No No    Current Medications (verified) Outpatient Encounter Medications as of 02/15/2018  Medication Sig  . Calcium-Magnesium 500-250 MG TABS Take 1 tablet by mouth daily.  Marland Kitchen levothyroxine (SYNTHROID, LEVOTHROID) 112 MCG tablet TAKE 1 TABLET (112 MCG TOTAL) BY MOUTH DAILY.  Marland Kitchen omeprazole (PRILOSEC) 20 MG capsule TAKE 1 CAPSULE (20 MG TOTAL) BY MOUTH DAILY BEFORE BREAKFAST.  . [DISCONTINUED] Chlorphen-Pseudoephed-APAP (ALLERGY/SINUS APAP PO) Take 1 tablet by mouth daily as needed (sinus).  . [DISCONTINUED] chlorpheniramine-HYDROcodone (TUSSIONEX PENNKINETIC ER) 10-8 MG/5ML SUER Take 5 mLs by mouth every 12 (twelve) hours as needed for cough.  . [DISCONTINUED] diclofenac (CATAFLAM) 50 MG tablet Take 1 tablet (50 mg total) by mouth 2 (two) times daily. (Patient taking differently: Take 50 mg by mouth daily as needed (pain). )  . [DISCONTINUED]  gabapentin (NEURONTIN) 100 MG capsule Take 1 capsule (100 mg total) by mouth at bedtime. (Patient taking differently: Take 100 mg by mouth daily as needed (pain). )  . [DISCONTINUED] Polyvinyl Alcohol (LUBRICANT DROPS OP) Apply 1 drop to eye daily as needed (dry eyes).  . [DISCONTINUED] promethazine (PHENERGAN) 12.5 MG tablet Take 1 tablet (12.5 mg total) by mouth every 6 (six) hours as needed for nausea or vomiting.   No facility-administered encounter medications on file as of 02/15/2018.     Allergies (verified) Aspirin and Diazepam   History: Past Medical History:  Diagnosis Date  . Anxiety disorder   . Carotid artery stenosis   . Chronic pelvic pain in female   . Coronary artery disease   . Endometriosis   . GERD (gastroesophageal reflux disease)   . Helicobacter pylori gastritis    Dr. Olevia Perches   . Hiatal hernia 2003   on UGI   . Hypothyroidism 2009  . IBS (irritable bowel syndrome) 2003  . Obesity (BMI 30-39.9) DEC 2011 145 LBS  . Osteoporosis   . PONV (postoperative nausea and vomiting)    Past Surgical History:  Procedure Laterality Date  . ABDOMINAL HYSTERECTOMY    . adhesiolysis     dense adhesion between rectum and sigmoid,  & pelvis   . BRAVO [place] STUDY  01/19/2012   Procedure: BRAVO [place];  Surgeon: [name], MD;  Location: [facility];  Service: Endoscopy;;  . COLONOSCOPY  03: [facility]/D & 08: SCREENING   WNL'S  . COLONOSCOPY  07/28/2011   sessile polyp in ascending colon/internal hemorrhoids. Next colonoscopy January 2018  . COLONOSCOPY N/A 08/01/2016   Procedure: COLONOSCOPY;  Surgeon: [name], MD;  Location: [facility];  Service: Endoscopy;  Laterality: N/A;  930   . ESOPHAGOGASTRODUODENOSCOPY  01/19/2012   SLF: SMALL Hiatal hernia/Mild gastritis  . EYE SURGERY Right 10/18/2013   catarat extraction  . LAPAROSCOPY    . rt foot sx    . s/p hysterectomy    . SALPINGOOPHORECTOMY    . UPPER GASTROINTESTINAL ENDOSCOPY  DEC 2010: DYSPEPSIA/DYSPHAGIA     Dewy Rose RING/ESO DIL 16 MM, GASTRITIS/DUODENITIS 2o to Aleve/on OMP PRN   Family History  Problem Relation Age of Onset  . Prostate cancer Father   . Colon cancer Father        < 66 YO  . Cancer Father        prostate and colon  . Pancreatic cancer Mother   . Cancer Mother        pancreas  . Diabetes Mother   . Diabetes Brother   . Cancer Brother        prostate   . Diabetes Sister        prediabetic  . Heart disease Brother        CHF  . Colon polyps Neg Hx    Social History   Socioeconomic History  . Marital status: Divorced    Spouse name: Not on file  . Number of children: Not on file  . Years of education: Not on file  . Highest education level: Not on file  Occupational History  . Occupation: fulltime at [employer]  . Financial resource strain: Not hard at all  . Food insecurity:    Worry: Never true    Inability: Never true  . Transportation needs:    Medical: No    Non-medical: No  Tobacco Use  . Smoking status: Never Smoker  . Smokeless tobacco: Never Used  . Tobacco comment: Never smoked  Substance and Sexual Activity  . Alcohol use: Yes    Alcohol/week: 0.0 standard drinks    Comment: occasional, maybe once/year  . Drug use: No  . Sexual activity: Not on file  Lifestyle  . Physical activity:    Days per week: 4 days    Minutes per session: 60 min  . Stress: Only a little  Relationships  . Social connections:    Talks on phone: Three times a week    Gets together: Three times a week    Attends religious service: More than 4 times per year    Active member of club or organization: Yes    Attends meetings of clubs or organizations: Never    Relationship status: Divorced  Other Topics Concern  . Not on file  Social History Narrative  . Not on file    Tobacco Counseling Counseling given: Not Answered Comment: Never smoked   Clinical Intake:  Pre-visit preparation completed: No  Pain : No/denies pain Pain  Score: 0-No pain     Nutritional Status: BMI > 30  Obese Diabetes: No  How often do you need to have someone help you when you read instructions, pamphlets, or other written materials from your doctor or pharmacy?: 1 - Never What is the last grade level you completed in school?: college   Interpreter Needed?: No  Information entered by :: brandi  hudy LPN    Activities of Daily Living In your present state of health, do you have any difficulty performing the following activities: 02/15/2018 08/14/2017  Hearing? N N  Vision? N N  Difficulty concentrating or making decisions? N N  Walking or climbing stairs? N N  Dressing or bathing? N N  Doing errands, shopping? N N  Preparing Food and eating ? N -  Using the Toilet? N -  In the past six months, have you accidently leaked urine? N -  Do you have problems with loss of bowel control? N -  Managing your Medications? N -  Managing your Finances? N -  Housekeeping or managing your Housekeeping? N -  Some recent data might be hidden     Immunizations and Health Maintenance Immunization History  Administered Date(s) Administered  . Influenza,inj,Quad PF,6+ Mos 04/13/2013, 05/09/2014, 03/23/2015  . Tdap 09/18/2011  . Zoster 09/18/2011   Health Maintenance Due  Topic Date Due  . Hepatitis C Screening  23-Nov-1950  . PNA vac Low Risk Adult (1 of 2 - PCV13) 07/02/2016  . INFLUENZA VACCINE  01/28/2018    Patient Care Team: Fayrene Helper, MD as PCP - General (Family Medicine) [name], MD (Gastroenterology)  Indicate any recent Medical Services you may have received from other than [facility] providers in the past year (date may be approximate).     Assessment:   This is a routine wellness examination for [place].  Hearing/Vision screen No exam data present  Dietary issues and exercise activities discussed: Current Exercise Habits: Structured exercise class, Time (Minutes): 60, Frequency (Times/Week): 4, Weekly  Exercise (Minutes/Week): 240, Intensity: Moderate  Goals   None    Depression Screen PHQ 2/9 Scores 02/15/2018 08/14/2017 08/20/2016 10/15/2015 05/24/2014 10/19/2013  PHQ - 2 Score 0 0 0 0 0 1  PHQ- 9 Score 6 - - - - 5    Fall Risk Fall Risk  02/15/2018 08/14/2017 08/20/2016 10/15/2015 05/24/2014  Falls in the past year? No No No No No    Is the patient's home free of loose throw rugs in walkways, pet beds, electrical cords, etc?   yes      Grab bars in the bathroom? yes      Handrails on the stairs?   yes      Adequate lighting?   yes  Timed Get Up and Go Performed   Cognitive Function:     6CIT Screen 02/15/2018  What Year? 0 points  What month? 0 points  What time? 0 points  Count back from 20 0 points  Months in reverse 2 points  Repeat phrase 2 points  Total Score 4    Screening Tests Health Maintenance  Topic Date Due  . Hepatitis C Screening  January 24, 1951  . PNA vac Low Risk Adult (1 of 2 - PCV13) 07/02/2016  . INFLUENZA VACCINE  01/28/2018  . MAMMOGRAM  12/12/2019  . TETANUS/TDAP  09/17/2021  . COLONOSCOPY  08/01/2026  . DEXA SCAN  Completed    Qualifies for Shingles Vaccine? Ask insurance   Cancer Screenings: Lung: Low Dose CT Chest recommended if Age 39-80 years, 30 pack-year currently smoking OR have quit w/in 15years. Patient does not qualify. Breast: Up to date on Mammogram? Yes   Up to date of Bone Density/Dexa? No Colorectal: up to date   Additional Screenings: Hepatitis C Screening:      Plan:     I have personally reviewed and noted the following in the patient's  chart:   . Medical and social history . Use of alcohol, tobacco or illicit drugs  . Current medications and supplements . Functional ability and status . Nutritional status . Physical activity . Advanced directives . List of other physicians . Hospitalizations, surgeries, and ER visits in previous 12 months . Vitals . Screenings to include cognitive, depression, and  falls . Referrals and appointments  In addition, I have reviewed and discussed with patient certain preventive protocols, quality metrics, and best practice recommendations. A written personalized care plan for preventive services as well as general preventive health recommendations were provided to patient.     Kate Sable, LPN, LPN   0/[id number]/[id number]

## 2018-02-15 NOTE — Telephone Encounter (Signed)
Labs ordered per Dr.Simpson  

## 2018-02-16 DIAGNOSIS — E790 Hyperuricemia without signs of inflammatory arthritis and tophaceous disease: Secondary | ICD-10-CM | POA: Diagnosis not present

## 2018-02-16 DIAGNOSIS — Z23 Encounter for immunization: Secondary | ICD-10-CM | POA: Diagnosis not present

## 2018-02-16 DIAGNOSIS — Z0000 Encounter for general adult medical examination without abnormal findings: Secondary | ICD-10-CM | POA: Diagnosis not present

## 2018-02-18 ENCOUNTER — Ambulatory Visit: Payer: PPO | Admitting: Family Medicine

## 2018-02-24 DIAGNOSIS — E790 Hyperuricemia without signs of inflammatory arthritis and tophaceous disease: Secondary | ICD-10-CM | POA: Diagnosis not present

## 2018-02-24 DIAGNOSIS — R7989 Other specified abnormal findings of blood chemistry: Secondary | ICD-10-CM | POA: Diagnosis not present

## 2018-02-24 DIAGNOSIS — E559 Vitamin D deficiency, unspecified: Secondary | ICD-10-CM | POA: Diagnosis not present

## 2018-02-24 DIAGNOSIS — R0989 Other specified symptoms and signs involving the circulatory and respiratory systems: Secondary | ICD-10-CM | POA: Diagnosis not present

## 2018-02-25 LAB — BASIC METABOLIC PANEL
BUN/Creatinine Ratio: 16 (ref 12–28)
BUN: 13 mg/dL (ref 8–27)
CO2: 23 mmol/L (ref 20–29)
Calcium: 9.5 mg/dL (ref 8.7–10.3)
Chloride: 107 mmol/L — ABNORMAL HIGH (ref 96–106)
Creatinine, Ser: 0.82 mg/dL (ref 0.57–1.00)
GFR calc Af Amer: 86 mL/min/{1.73_m2} (ref >59)
GFR calc non Af Amer: 75 mL/min/{1.73_m2} (ref >59)
Glucose: 80 mg/dL (ref 65–99)
Potassium: 4.4 mmol/L (ref 3.5–5.2)
Sodium: 144 mmol/L (ref 134–144)

## 2018-02-25 LAB — LIPID PANEL
Chol/HDL Ratio: 3.3 ratio (ref 0.0–4.4)
Cholesterol, Total: 194 mg/dL (ref 100–199)
HDL: 59 mg/dL (ref >39)
LDL Calculated: 122 mg/dL — ABNORMAL HIGH (ref 0–99)
Triglycerides: 65 mg/dL (ref 0–149)
VLDL Cholesterol Cal: 13 mg/dL (ref 5–40)

## 2018-02-25 LAB — CBC
Hematocrit: 40.9 % (ref 34.0–46.6)
Hemoglobin: 13.9 g/dL (ref 11.1–15.9)
MCH: 29.6 pg (ref 26.6–33.0)
MCHC: 34 g/dL (ref 31.5–35.7)
MCV: 87 fL (ref 79–97)
Platelets: 237 10*3/uL (ref 150–450)
RBC: 4.69 x10E6/uL (ref 3.77–5.28)
RDW: 14 % (ref 12.3–15.4)
WBC: 4.6 10*3/uL (ref 3.4–10.8)

## 2018-02-25 LAB — URIC ACID: Uric Acid: 6.1 mg/dL (ref 2.5–7.1)

## 2018-02-25 LAB — VITAMIN D 25 HYDROXY (VIT D DEFICIENCY, FRACTURES): Vit D, 25-Hydroxy: 18.3 ng/mL — ABNORMAL LOW (ref 30.0–100.0)

## 2018-03-10 ENCOUNTER — Telehealth: Payer: Self-pay | Admitting: Family Medicine

## 2018-03-10 NOTE — Telephone Encounter (Signed)
[phone]   Patient is requesting a call back to discuss lab results

## 2018-03-11 NOTE — Telephone Encounter (Signed)
Called pt no answer.  Pt has appt sept 16. Advised that MD would go over her lab results at that time

## 2018-03-15 ENCOUNTER — Other Ambulatory Visit: Payer: Self-pay

## 2018-03-15 ENCOUNTER — Ambulatory Visit (INDEPENDENT_AMBULATORY_CARE_PROVIDER_SITE_OTHER): Payer: PPO | Admitting: Family Medicine

## 2018-03-15 ENCOUNTER — Encounter: Payer: Self-pay | Admitting: Family Medicine

## 2018-03-15 VITALS — BP 130/80 | HR 80 | Resp 12 | Ht 58.5 in | Wt 147.1 lb

## 2018-03-15 DIAGNOSIS — E559 Vitamin D deficiency, unspecified: Secondary | ICD-10-CM | POA: Diagnosis not present

## 2018-03-15 DIAGNOSIS — Z23 Encounter for immunization: Secondary | ICD-10-CM | POA: Diagnosis not present

## 2018-03-15 DIAGNOSIS — R7989 Other specified abnormal findings of blood chemistry: Secondary | ICD-10-CM

## 2018-03-15 DIAGNOSIS — E65 Localized adiposity: Secondary | ICD-10-CM

## 2018-03-15 DIAGNOSIS — E039 Hypothyroidism, unspecified: Secondary | ICD-10-CM

## 2018-03-15 NOTE — Progress Notes (Signed)
   Kim Grimes     MRN: [id number]      DOB: 10-09-1950   HPI Kim Grimes is here for follow up and re-evaluation of chronic medical conditions, medication management and review of any available recent lab and radiology data.  Preventive health is updated, specifically  Cancer screening and Immunization.   Questions or concerns regarding consultations or procedures which the PT has had in the interim are  Addressed.Has had gyne exam and sees endo re her thyroid management The PT denies any adverse reactions to current medications since the last visit.  Wants to reviewed labs and necessary treatment   ROS Denies recent fever or chills. Denies sinus pressure, nasal congestion, ear pain or sore throat. Denies chest congestion, productive cough or wheezing. Denies chest pains, palpitations and leg swelling Denies abdominal pain, nausea, vomiting,diarrhea or constipation.   Denies dysuria, frequency, hesitancy or incontinence. Denies joint pain, swelling and limitation in mobility. Denies headaches, seizures, numbness, or tingling. Denies depression, anxiety or insomnia. Denies skin break down or rash.   PE  BP 130/80   Pulse 80   Resp 12   Ht 4' 10.5" (1.486 m)   Wt 147 lb 1.3 oz (66.7 kg)   SpO2 97% Comment: room air  BMI 30.22 kg/m   Patient alert and oriented and in no cardiopulmonary distress.  HEENT: No facial asymmetry, EOMI,   oropharynx pink and moist.  Neck supple no JVD, no mass.  Chest: Clear to auscultation bilaterally.  CVS: S1, S2 no murmurs, no S3.Regular rate.  ABD: Soft non tender.   Ext: No edema  MS: Adequate ROM spine, shoulders, hips and knees.  Skin: Intact, no ulcerations or rash noted.  Psych: Good eye contact, normal affect. Memory intact not anxious or depressed appearing.  CNS: CN 2-12 intact, power,  normal throughout.no focal deficits noted.   Assessment & Plan  Truncal obesity Gradually improving over time, she is encouraged  to continue same. Patient re-educated about  the importance of commitment to a  minimum of 150 minutes of exercise per week.  The importance of healthy food choices with portion control discussed. Encouraged to start a food diary, count calories and to consider  joining a support group. Sample diet sheets offered. Goals set by the patient for the next several months.   Weight /BMI 03/15/2018 02/15/2018 08/14/2017  WEIGHT 147 lb 1.3 oz 147 lb 149 lb  HEIGHT 4' 10.5" 4\' 10"  4\' 10"   BMI 30.22 kg/m2 30.72 kg/m2 31.14 kg/m2      Vitamin D deficiency Uncorrected needs to supplement with OTC vitamin D3 2000 IU  daily  Hypothyroidism Managed by endo  High serum low-density lipoprotein (LDL) Hyperlipidemia:Low fat diet discussed and encouraged.   Lipid Panel  Lab Results  Component Value Date   CHOL 194 02/24/2018   HDL 59 02/24/2018   LDLCALC 122 (H) 02/24/2018   TRIG 65 02/24/2018   CHOLHDL 3.3 02/24/2018    Uncontrolled , needs to lower fat intake   Need for vaccination with 13-polyvalent pneumococcal conjugate vaccine After obtaining informed consent, the vaccine is  administered by LPN.

## 2018-03-15 NOTE — Assessment & Plan Note (Signed)
Uncorrected needs to supplement with OTC vitamin D3 2000 IU  daily

## 2018-03-15 NOTE — Assessment & Plan Note (Signed)
Hyperlipidemia:Low fat diet discussed and encouraged.   Lipid Panel  Lab Results  Component Value Date   CHOL 194 02/24/2018   HDL 59 02/24/2018   LDLCALC 122 (H) 02/24/2018   TRIG 65 02/24/2018   CHOLHDL 3.3 02/24/2018    Uncontrolled , needs to lower fat intake

## 2018-03-15 NOTE — Assessment & Plan Note (Signed)
After obtaining informed consent, the vaccine is  administered by LPN.  

## 2018-03-15 NOTE — Assessment & Plan Note (Signed)
Managed by endo

## 2018-03-15 NOTE — Patient Instructions (Signed)
F/U in 6 months, call if you need me before  Fasting lipid in 5.5 months   And vit D   Flu vaccine today  Prevnar today   Please  Start vit D3 @000  IU one daily  Commit to daily exercise for 30 to 40 mins   Excellent weight management and labs with healthy lifestyle , so kep iy up!  Thank you  for choosing [facility]. We consider it a privelige to serve you.  Delivering excellent health care in a caring and  compassionate way is our goal.  Partnering with you,  so that together we can achieve this goal is our strategy.

## 2018-03-15 NOTE — Assessment & Plan Note (Signed)
Gradually improving over time, she is encouraged to continue same. Patient re-educated about  the importance of commitment to a  minimum of 150 minutes of exercise per week.  The importance of healthy food choices with portion control discussed. Encouraged to start a food diary, count calories and to consider  joining a support group. Sample diet sheets offered. Goals set by the patient for the next several months.   Weight /BMI 03/15/2018 02/15/2018 08/14/2017  WEIGHT 147 lb 1.3 oz 147 lb 149 lb  HEIGHT 4' 10.5" 4\' 10"  4\' 10"   BMI 30.22 kg/m2 30.72 kg/m2 31.14 kg/m2

## 2018-05-24 ENCOUNTER — Encounter: Payer: Self-pay | Admitting: Family Medicine

## 2018-05-24 ENCOUNTER — Ambulatory Visit (INDEPENDENT_AMBULATORY_CARE_PROVIDER_SITE_OTHER): Payer: PPO | Admitting: Family Medicine

## 2018-05-24 VITALS — BP 130/80 | HR 82 | Resp 12 | Ht <= 58 in | Wt 147.0 lb

## 2018-05-24 DIAGNOSIS — E65 Localized adiposity: Secondary | ICD-10-CM

## 2018-05-24 DIAGNOSIS — M7022 Olecranon bursitis, left elbow: Secondary | ICD-10-CM | POA: Diagnosis not present

## 2018-05-24 NOTE — Patient Instructions (Addendum)
F/U as before , call if you need me sooner   You have the same problem as you  Did in 2017, and per your decision you are referred again to Dr Aline Brochure for his management  No medication by mouth recommended can be prescribed for you as you report intolerance

## 2018-05-24 NOTE — Assessment & Plan Note (Signed)
Recurrent problem since March, 2017, dx with bursitis by Ortho and advised to return for recurrence and possible aspiration

## 2018-05-26 ENCOUNTER — Encounter: Payer: Self-pay | Admitting: Family Medicine

## 2018-05-26 NOTE — Progress Notes (Signed)
   Kim Grimes     MRN: [id number]      DOB: [birth year]   HPI Kim Grimes is here with a 2 week h/o left elbow pain, after bracing on the elbow getting out of her car, She reports swelling over the area, she has had the same problem been evaluated by Ortho and wishes  To return Reports intolerance to both NSAIDS and oral prednisone , so unable to offer any medication in the interim   ROS See HPI Denies recent fever or chills. Denies sinus pressure, nasal congestion, ear pain or sore throat. Denies chest congestion, productive cough or wheezing. Denies chest pains, palpitations and leg swelling Denies abdominal pain, nausea, vomiting,diarrhea or constipation.    Denies skin break down or rash.   PE  BP 130/80 (BP Location: Right Arm, Patient Position: Sitting, Cuff Size: Normal)   Pulse 82   Resp 12   Ht 4\' 10"  (1.473 m)   Wt 147 lb 0.6 oz (66.7 kg)   SpO2 98% Comment: room air  BMI 30.73 kg/m   Patient alert and oriented and in no cardiopulmonary distress.  HEENT: No facial asymmetry, EOMI,   oropharynx pink and moist.  Neck supple no JVD, no mass.  Chest: Clear to auscultation bilaterally.  CVS: S1, S2 no murmurs, no S3.Regular rate.  ABD: Soft non tender.   Ext: No edema  MS: Adequate ROM spine, shoulders, hips and knees.Tender swelling over left elbow, no erythema or warmth  Skin: Intact, no ulcerations or rash noted.  Psych: Good eye contact, normal affect. Memory intact not anxious or depressed appearing.  CNS: CN 2-12 intact, power,  normal throughout.no focal deficits noted.   Assessment & Plan  Olecranon bursitis of left elbow Recurrent problem since March, 2017, dx with bursitis by Ortho and advised to return for recurrence and possible aspiration  Truncal obesity Deteriorated. Patient re-educated about  the importance of commitment to a  minimum of 150 minutes of exercise per week.  The importance of healthy food choices with portion  control discussed. Encouraged to start a food diary, count calories and to consider  joining a support group. Sample diet sheets offered. Goals set by the patient for the next several months.   Weight /BMI 05/24/2018 03/15/2018 02/15/2018  WEIGHT 147 lb 0.6 oz 147 lb 1.3 oz 147 lb  HEIGHT 4\' 10"  4' 10.5" 4\' 10"   BMI 30.73 kg/m2 30.22 kg/m2 30.72 kg/m2

## 2018-05-26 NOTE — Assessment & Plan Note (Signed)
Deteriorated. Patient re-educated about  the importance of commitment to a  minimum of 150 minutes of exercise per week.  The importance of healthy food choices with portion control discussed. Encouraged to start a food diary, count calories and to consider  joining a support group. Sample diet sheets offered. Goals set by the patient for the next several months.   Weight /BMI 05/24/2018 03/15/2018 02/15/2018  WEIGHT 147 lb 0.6 oz 147 lb 1.3 oz 147 lb  HEIGHT 4\' 10"  4' 10.5" 4\' 10"   BMI 30.73 kg/m2 30.22 kg/m2 30.72 kg/m2

## 2018-06-16 ENCOUNTER — Encounter: Payer: Self-pay | Admitting: Orthopedic Surgery

## 2018-06-16 ENCOUNTER — Ambulatory Visit (INDEPENDENT_AMBULATORY_CARE_PROVIDER_SITE_OTHER): Payer: PPO | Admitting: Orthopedic Surgery

## 2018-06-16 VITALS — BP 135/89 | HR 84 | Ht <= 58 in | Wt 140.0 lb

## 2018-06-16 DIAGNOSIS — M7022 Olecranon bursitis, left elbow: Secondary | ICD-10-CM

## 2018-06-16 NOTE — Progress Notes (Signed)
Recurrent problem OFFICE VISIT  Chief Complaint  Patient presents with  . Elbow Pain    left    67 year old female works for the local [employer] presents for evaluation of recurrent pain over her left elbow with hypersensitivity and nodularity.  She says she was getting out of her car pushed up against her left elbow became swollen and is tender over the olecranon she cannot place it down on anything is extremely sensitive.  She denies loss of motion she denies any joint swelling she denies any joint ache pain is located over the olecranon and she refuses any oral NSAIDs or prednisone she refused to have any padding placed over the elbow   Review of Systems  Skin: Negative.   Neurological: Negative for tingling and sensory change.     Past Medical History:  Diagnosis Date  . Anxiety disorder   . Carotid artery stenosis   . Chronic pelvic pain in female   . Coronary artery disease   . Endometriosis   . GERD (gastroesophageal reflux disease)   . Helicobacter pylori gastritis    Dr. Olevia Perches   . Hiatal hernia 2003   on UGI   . Hypothyroidism 2009  . IBS (irritable bowel syndrome) 2003  . Obesity (BMI 30-39.9) DEC 2011 145 LBS  . Osteoporosis   . PONV (postoperative nausea and vomiting)     Past Surgical History:  Procedure Laterality Date  . ABDOMINAL HYSTERECTOMY    . adhesiolysis     dense adhesion between rectum and sigmoid,  & pelvis   . BRAVO [place] STUDY  01/19/2012   Procedure: BRAVO [place];  Surgeon: [name], MD;  Location: [facility];  Service: Endoscopy;;  . COLONOSCOPY  03: [facility]/D & 08: SCREENING   WNL'S  . COLONOSCOPY  07/28/2011   sessile polyp in ascending colon/internal hemorrhoids. Next colonoscopy January 2018  . COLONOSCOPY N/A 08/01/2016   Procedure: COLONOSCOPY;  Surgeon: [name], MD;  Location: [facility];  Service: Endoscopy;  Laterality: N/A;  930   . ESOPHAGOGASTRODUODENOSCOPY  01/19/2012   SLF: SMALL Hiatal hernia/Mild gastritis  .  EYE SURGERY Right 10/18/2013   catarat extraction  . LAPAROSCOPY    . rt foot sx    . s/p hysterectomy    . SALPINGOOPHORECTOMY    . UPPER GASTROINTESTINAL ENDOSCOPY  DEC 2010: DYSPEPSIA/DYSPHAGIA   [place] RING/ESO DIL 16 MM, GASTRITIS/DUODENITIS 2o to [place]/on OMP PRN    Family History  Problem Relation Age of Onset  . Prostate cancer Father   . Colon cancer Father        < 40 YO  . Cancer Father        prostate and colon  . Pancreatic cancer Mother   . Cancer Mother        pancreas  . Diabetes Mother   . Diabetes Brother   . Cancer Brother        prostate   . Diabetes Sister        prediabetic  . Heart disease Brother        CHF  . Colon polyps Neg Hx    Social History   Tobacco Use  . Smoking status: Never Smoker  . Smokeless tobacco: Never Used  . Tobacco comment: Never smoked  Substance Use Topics  . Alcohol use: Yes    Alcohol/week: 0.0 standard drinks    Comment: occasional, maybe once/year  . Drug use: No    Allergies  Allergen Reactions  .  Aspirin Other (See Comments)    Intolerant of blood thinners  . Diazepam Other (See Comments)    Aggitation    Current Meds  Medication Sig  . Calcium-Magnesium-Vitamin D (CALCIUM MAGNESIUM PO) Take 1 tablet by mouth daily.  Marland Kitchen levothyroxine (SYNTHROID, LEVOTHROID) 112 MCG tablet TAKE 1 TABLET (112 MCG TOTAL) BY MOUTH DAILY.  Marland Kitchen omeprazole (PRILOSEC) 20 MG capsule TAKE 1 CAPSULE (20 MG TOTAL) BY MOUTH DAILY BEFORE BREAKFAST.    BP 135/89   Pulse 84   Ht 4\' 10"  (1.473 m)   Wt 140 lb (63.5 kg)   BMI 29.26 kg/m   Physical Exam Vitals signs reviewed.  Constitutional:      General: She is not in acute distress.    Appearance: She is well-developed and normal weight. She is not ill-appearing.  Neurological:     Mental Status: She is alert and oriented to person, place, and time.  Psychiatric:        Judgment: Judgment normal.     Ortho Exam Right elbow: Normal range of motion skin looks great no  instability strength is normal no tenderness neurovascular exam is intact no atrophy or tremor  On the left side she has a small nodule at the olecranon it is tender to touch joint motion is normal skin is otherwise clean this area is very tender.  Elbow joint is stable muscle tone and strength are normal there is no tremor  Neurovascular exam is intact   MEDICAL DECISION SECTION  Xrays were done at No new x-rays were done x-ray from 2017 showed a very small area of prominence at the olecranon  Encounter Diagnosis  Name Primary?  Marland Kitchen Olecranon bursitis of left elbow Yes    PLAN: (Rx., injectx, surgery, frx, mri/ct) Patient declines any nonoperative treatments, so we have agreed to go ahead and remove the spur in the subcutaneous mass which could be a neurofibroma.  We will contour the elbow around the olecranon at the same time  She understands that she still may have hypersensitivity to this area after the surgery and she did not want to take a week off of work as I advised to let the wound heal properly  She is excepted the risks of wound problem after surgery  Plan for left olecranon bursectomy and spur removal  No orders of the defined types were placed in this encounter.   Arther Abbott, MD  06/16/2018 4:47 PM

## 2018-06-18 NOTE — Patient Instructions (Signed)
Kim Grimes  06/18/2018     @PREFPERIOPPHARMACY @   Your procedure is scheduled on  07/01/2018  Report to [facility] at  940   A.M.  Call this number if you have problems the morning of surgery:  [phone]   Remember:  Do not eat or drink after midnight.                        Take these medicines the morning of surgery with A SIP OF WATER  Levothyroxine, prilosec.    Do not wear jewelry, make-up or nail polish.  Do not wear lotions, powders, or perfumes, or deodorant.  Do not shave 48 hours prior to surgery.  Men may shave face and neck.  Do not bring valuables to the hospital.  [facility] is not responsible for any belongings or valuables.  Contacts, dentures or bridgework may not be worn into surgery.  Leave your suitcase in the car.  After surgery it may be brought to your room.  For patients admitted to the hospital, discharge time will be determined by your treatment team.  Patients discharged the day of surgery will not be allowed to drive home.   Name and phone number of your driver:   family Special instructions:  none  Please read over the following fact sheets that you were given. Anesthesia Post-op Instructions and Care and Recovery After Surgery       Incision Care, Adult An incision is a cut that a doctor makes in your skin for surgery (for a procedure). Most times, these cuts are closed after surgery. Your cut from surgery may be closed with stitches (sutures), staples, skin glue, or skin tape (adhesive strips). You may need to return to your doctor to have stitches or staples taken out. This may happen many days or many weeks after your surgery. The cut needs to be well cared for so it does not get infected. How to care for your cut Cut care   Follow instructions from your doctor about how to take care of your cut. Make sure you: ? Wash your hands with soap and water before you change your bandage (dressing). If you cannot use  soap and water, use hand sanitizer. ? Change your bandage as told by your doctor. ? Leave stitches, skin glue, or skin tape in place. They may need to stay in place for 2 weeks or longer. If tape strips get loose and curl up, you may trim the loose edges. Do not remove tape strips completely unless your doctor says it is okay.  Check your cut area every day for signs of infection. Check for: ? More redness, swelling, or pain. ? More fluid or blood. ? Warmth. ? Pus or a bad smell.  Ask your doctor how to clean the cut. This may include: ? Using mild soap and water. ? Using a clean towel to pat the cut dry after you clean it. ? Putting a cream or ointment on the cut. Do this only as told by your doctor. ? Covering the cut with a clean bandage.  Ask your doctor when you can leave the cut uncovered.  Do not take baths, swim, or use a hot tub until your doctor says it is okay. Ask your doctor if you can take showers. You may only be allowed to take sponge baths for bathing. Medicines  If you were prescribed an antibiotic  medicine, cream, or ointment, take the antibiotic or put it on the cut as told by your doctor. Do not stop taking or putting on the antibiotic even if your condition gets better.  Take over-the-counter and prescription medicines only as told by your doctor. General instructions  Limit movement around your cut. This helps healing. ? Avoid straining, lifting, or exercise for the first month, or for as long as told by your doctor. ? Follow instructions from your doctor about going back to your normal activities. ? Ask your doctor what activities are safe.  Protect your cut from the sun when you are outside for the first 6 months, or for as long as told by your doctor. Put on sunscreen around the scar or cover up the scar.  Keep all follow-up visits as told by your doctor. This is important. Contact a doctor if:  Your have more redness, swelling, or pain around the  cut.  You have more fluid or blood coming from the cut.  Your cut feels warm to the touch.  You have pus or a bad smell coming from the cut.  You have a fever or shaking chills.  You feel sick to your stomach (nauseous) or you throw up (vomit).  You are dizzy.  Your stitches or staples come undone. Get help right away if:  You have a red streak coming from your cut.  Your cut bleeds through the bandage and the bleeding does not stop with gentle pressure.  The edges of your cut open up and separate.  You have very bad (severe) pain.  You have a rash.  You are confused.  You pass out (faint).  You have trouble breathing and you have a fast heartbeat. This information is not intended to replace advice given to you by your health care provider. Make sure you discuss any questions you have with your health care provider. Document Released: 09/08/2011 Document Revised: 02/22/2016 Document Reviewed: 02/22/2016 Elsevier Interactive Patient Education  2019 [employer]  Elbow Bursitis Bursitis is swelling and pain at the tip of your elbow. This happens when fluid builds up in a sac under your skin (bursa). This may also be called olecranon bursitis. Follow these instructions at home: Medicines  Take over-the-counter and prescription medicines only as told by your doctor.  If you were prescribed an antibiotic, take it exactly as told by your doctor. Do not stop taking it even if you start to feel better. Managing pain, stiffness, and swelling   If told, put ice on your elbow: ? Put ice in a plastic bag. ? Place a towel between your skin and the bag. ? Leave the ice on for 20 minutes, 2-3 times a day.  If your bursitis is caused by an injury, follow instructions from your doctor about: ? Resting your elbow. ? Wearing a bandage.  Wear elbow pads or elbow wraps as needed. These help cushion your elbow. General instructions  Avoid any activities that cause elbow pain.  Ask your doctor what activities are safe for you.  Keep all follow-up visits as told by your doctor. This is important. Contact a doctor if you have:  A fever.  Problems that do not get better with treatment.  Pain or swelling that: ? Gets worse. ? Goes away and then comes back.  Pus draining from your elbow. Get help right away if you have:  Trouble moving your arm, hand, or fingers. Summary  Bursitis is swelling and pain at the tip of  the elbow.  You may need to take medicine or put ice on your elbow.  Contact your doctor if your problems do not get better with treatment. This information is not intended to replace advice given to you by your health care provider. Make sure you discuss any questions you have with your health care provider. Document Released: 12/04/2009 Document Revised: 05/26/2017 Document Reviewed: 05/26/2017 Elsevier Interactive Patient Education  2019 [place] Anesthesia is a term that refers to techniques, procedures, and medicines that help a person stay safe and comfortable during a medical procedure. Monitored anesthesia care, or sedation, is one type of anesthesia. Your anesthesia specialist may recommend sedation if you will be having a procedure that does not require you to be unconscious, such as:  Cataract surgery.  A dental procedure.  A biopsy.  A colonoscopy. During the procedure, you may receive a medicine to help you relax (sedative). There are three levels of sedation:  Mild sedation. At this level, you may feel awake and relaxed. You will be able to follow directions.  Moderate sedation. At this level, you will be sleepy. You may not remember the procedure.  Deep sedation. At this level, you will be asleep. You will not remember the procedure. The more medicine you are given, the deeper your level of sedation will be. Depending on how you respond to the procedure, the anesthesia specialist may change  your level of sedation or the type of anesthesia to fit your needs. An anesthesia specialist will monitor you closely during the procedure. Let your health care provider know about:  Any allergies you have.  All medicines you are taking, including vitamins, herbs, eye drops, creams, and over-the-counter medicines.  Any use of steroids (by mouth or as a cream).  Any problems you or family members have had with sedatives and anesthetic medicines.  Any blood disorders you have.  Any surgeries you have had.  Any medical conditions you have, such as sleep apnea.  Whether you are pregnant or may be pregnant.  Any use of cigarettes, alcohol, or street drugs. What are the risks? Generally, this is a safe procedure. However, problems may occur, including:  Getting too much medicine (oversedation).  Nausea.  Allergic reaction to medicines.  Trouble breathing. If this happens, a breathing tube may be used to help with breathing. It will be removed when you are awake and breathing on your own.  Heart trouble.  Lung trouble. Before the procedure Staying hydrated Follow instructions from your health care provider about hydration, which may include:  Up to 2 hours before the procedure - you may continue to drink clear liquids, such as water, clear fruit juice, black coffee, and plain tea. Eating and drinking restrictions Follow instructions from your health care provider about eating and drinking, which may include:  8 hours before the procedure - stop eating heavy meals or foods such as meat, fried foods, or fatty foods.  6 hours before the procedure - stop eating light meals or foods, such as toast or cereal.  6 hours before the procedure - stop drinking milk or drinks that contain milk.  2 hours before the procedure - stop drinking clear liquids. Medicines Ask your health care provider about:  Changing or stopping your regular medicines. This is especially important if you are  taking diabetes medicines or blood thinners.  Taking medicines such as aspirin and ibuprofen. These medicines can thin your blood. Do not take these medicines before your procedure  if your health care provider instructs you not to. Tests and exams  You will have a physical exam.  You may have blood tests done to show: ? How well your kidneys and liver are working. ? How well your blood can clot. General instructions  Plan to have someone take you home from the hospital or clinic.  If you will be going home right after the procedure, plan to have someone with you for 24 hours.  What happens during the procedure?  Your blood pressure, heart rate, breathing, level of pain and overall condition will be monitored.  An IV tube will be inserted into one of your veins.  Your anesthesia specialist will give you medicines as needed to keep you comfortable during the procedure. This may mean changing the level of sedation.  The procedure will be performed. After the procedure  Your blood pressure, heart rate, breathing rate, and blood oxygen level will be monitored until the medicines you were given have worn off.  Do not drive for 24 hours if you received a sedative.  You may: ? Feel sleepy, clumsy, or nauseous. ? Feel forgetful about what happened after the procedure. ? Have a sore throat if you had a breathing tube during the procedure. ? Vomit. This information is not intended to replace advice given to you by your health care provider. Make sure you discuss any questions you have with your health care provider. Document Released: 03/12/2005 Document Revised: 11/23/2015 Document Reviewed: 10/07/2015 Elsevier Interactive Patient Education  2019 [place], Care After These instructions provide you with information about caring for yourself after your procedure. Your health care provider may also give you more specific instructions. Your treatment has been  planned according to current medical practices, but problems sometimes occur. Call your health care provider if you have any problems or questions after your procedure. What can I expect after the procedure? After your procedure, you may:  Feel sleepy for several hours.  Feel clumsy and have poor balance for several hours.  Feel forgetful about what happened after the procedure.  Have poor judgment for several hours.  Feel nauseous or vomit.  Have a sore throat if you had a breathing tube during the procedure. Follow these instructions at home: For at least 24 hours after the procedure:      Have a responsible adult stay with you. It is important to have someone help care for you until you are awake and alert.  Rest as needed.  Do not: ? Participate in activities in which you could fall or become injured. ? Drive. ? Use heavy machinery. ? Drink alcohol. ? Take sleeping pills or medicines that cause drowsiness. ? Make important decisions or sign legal documents. ? Take care of children on your own. Eating and drinking  Follow the diet that is recommended by your health care provider.  If you vomit, drink water, juice, or soup when you can drink without vomiting.  Make sure you have little or no nausea before eating solid foods. General instructions  Take over-the-counter and prescription medicines only as told by your health care provider.  If you have sleep apnea, surgery and certain medicines can increase your risk for breathing problems. Follow instructions from your health care provider about wearing your sleep device: ? Anytime you are sleeping, including during daytime naps. ? While taking prescription pain medicines, sleeping medicines, or medicines that make you drowsy.  If you smoke, do not smoke without supervision.  Keep all follow-up visits as told by your health care provider. This is important. Contact a health care provider if:  You keep feeling  nauseous or you keep vomiting.  You feel light-headed.  You develop a rash.  You have a fever. Get help right away if:  You have trouble breathing. Summary  For several hours after your procedure, you may feel sleepy and have poor judgment.  Have a responsible adult stay with you for at least 24 hours or until you are awake and alert. This information is not intended to replace advice given to you by your health care provider. Make sure you discuss any questions you have with your health care provider. Document Released: 10/07/2015 Document Revised: 01/30/2017 Document Reviewed: 10/07/2015 Elsevier Interactive Patient Education  2019 [employer].

## 2018-06-21 ENCOUNTER — Encounter (HOSPITAL_COMMUNITY)
Admission: RE | Admit: 2018-06-21 | Discharge: 2018-06-21 | Disposition: A | Discharge status: Home / Self Care | Payer: PPO | Source: Ambulatory Visit | Attending: Orthopedic Surgery | Admitting: Orthopedic Surgery

## 2018-06-21 ENCOUNTER — Other Ambulatory Visit: Payer: Self-pay | Admitting: Orthopedic Surgery

## 2018-06-21 DIAGNOSIS — M7022 Olecranon bursitis, left elbow: Secondary | ICD-10-CM

## 2018-06-22 ENCOUNTER — Encounter (HOSPITAL_COMMUNITY)
Admission: RE | Admit: 2018-06-22 | Discharge: 2018-06-22 | Disposition: A | Discharge status: Home / Self Care | Payer: PPO | Source: Ambulatory Visit | Attending: Orthopedic Surgery | Admitting: Orthopedic Surgery

## 2018-06-22 ENCOUNTER — Other Ambulatory Visit: Payer: Self-pay

## 2018-06-22 ENCOUNTER — Encounter (HOSPITAL_COMMUNITY): Payer: Self-pay

## 2018-06-22 DIAGNOSIS — Z01818 Encounter for other preprocedural examination: Secondary | ICD-10-CM | POA: Diagnosis not present

## 2018-06-22 LAB — CBC WITH DIFFERENTIAL/PLATELET
Abs Immature Granulocytes: 0.02 10*3/uL (ref 0.00–0.07)
Basophils Absolute: 0 10*3/uL (ref 0.0–0.1)
Basophils Relative: 1 %
Eosinophils Absolute: 0.1 10*3/uL (ref 0.0–0.5)
Eosinophils Relative: 2 %
HCT: 41.7 % (ref 36.0–46.0)
Hemoglobin: 13.1 g/dL (ref 12.0–15.0)
Immature Granulocytes: 0 %
Lymphocytes Relative: 38 %
Lymphs Abs: 1.9 10*3/uL (ref 0.7–4.0)
MCH: 28.4 pg (ref 26.0–34.0)
MCHC: 31.4 g/dL (ref 30.0–36.0)
MCV: 90.3 fL (ref 80.0–100.0)
Monocytes Absolute: 0.4 10*3/uL (ref 0.1–1.0)
Monocytes Relative: 8 %
Neutro Abs: 2.6 10*3/uL (ref 1.7–7.7)
Neutrophils Relative %: 51 %
Platelets: 233 10*3/uL (ref 150–400)
RBC: 4.62 MIL/uL (ref 3.87–5.11)
RDW: 12.9 % (ref 11.5–15.5)
WBC: 5 10*3/uL (ref 4.0–10.5)
nRBC: 0 % (ref 0.0–0.2)

## 2018-06-22 LAB — BASIC METABOLIC PANEL
Anion gap: 5 (ref 5–15)
BUN: 21 mg/dL (ref 8–23)
CO2: 25 mmol/L (ref 22–32)
Calcium: 8.9 mg/dL (ref 8.9–10.3)
Chloride: 110 mmol/L (ref 98–111)
Creatinine, Ser: 0.87 mg/dL (ref 0.44–1.00)
GFR calc Af Amer: >60 mL/min (ref >60)
GFR calc non Af Amer: >60 mL/min (ref >60)
Glucose, Bld: 87 mg/dL (ref 70–99)
Potassium: 3.9 mmol/L (ref 3.5–5.1)
Sodium: 140 mmol/L (ref 135–145)

## 2018-06-29 ENCOUNTER — Telehealth: Payer: Self-pay

## 2018-06-29 DIAGNOSIS — E039 Hypothyroidism, unspecified: Secondary | ICD-10-CM

## 2018-06-29 NOTE — Telephone Encounter (Signed)
LAB ORDER PLACED

## 2018-06-30 NOTE — H&P (Signed)
Recurrent problem OFFICE VISIT       Chief Complaint  Patient presents with  . Elbow Pain      left      68 year old female works for the local [employer] presents for evaluation of recurrent pain over her left elbow with hypersensitivity and nodularity.  She says she was getting out of her car pushed up against her left elbow became swollen and is tender over the olecranon she cannot place it down on anything is extremely sensitive.  She denies loss of motion she denies any joint swelling she denies any joint ache pain is located over the olecranon and she refuses any oral NSAIDs or prednisone she refused to have any padding placed over the elbow     Review of Systems  Skin: Negative.   Neurological: Negative for tingling and sensory change.            Past Medical History:  Diagnosis Date  . Anxiety disorder    . Carotid artery stenosis    . Chronic pelvic pain in female    . Coronary artery disease    . Endometriosis    . GERD (gastroesophageal reflux disease)    . Helicobacter pylori gastritis      Dr. Olevia Perches   . Hiatal hernia 2003    on UGI   . Hypothyroidism 2009  . IBS (irritable bowel syndrome) 2003  . Obesity (BMI 30-39.9) DEC 2011 145 LBS  . Osteoporosis    . PONV (postoperative nausea and vomiting)             Past Surgical History:  Procedure Laterality Date  . ABDOMINAL HYSTERECTOMY      . adhesiolysis        dense adhesion between rectum and sigmoid,  & pelvis   . BRAVO [place] STUDY   01/19/2012    Procedure: BRAVO [place];  Surgeon: [name], MD;  [facility];  Service: Endoscopy;;  . COLONOSCOPY   03: [facility]/D & 08: SCREENING    WNL'S  . COLONOSCOPY   07/28/2011    sessile polyp in ascending colon/internal hemorrhoids. Next colonoscopy January 2018  . COLONOSCOPY N/A 08/01/2016    Procedure: COLONOSCOPY;  Surgeon: [name], MD;  [facility];  Service: Endoscopy;  Laterality: N/A;  930    . ESOPHAGOGASTRODUODENOSCOPY    01/19/2012    SLF: SMALL Hiatal hernia/Mild gastritis  . EYE SURGERY Right 10/18/2013    catarat extraction  . LAPAROSCOPY      . rt foot sx      . s/p hysterectomy      . SALPINGOOPHORECTOMY      . UPPER GASTROINTESTINAL ENDOSCOPY   DEC 2010: DYSPEPSIA/DYSPHAGIA    [place] RING/ESO DIL 16 MM, GASTRITIS/DUODENITIS 2o to Aleve/on OMP PRN           Family History  Problem Relation Age of Onset  . Prostate cancer Father    . Colon cancer Father          < 8 YO  . Cancer Father          prostate and colon  . Pancreatic cancer Mother    . Cancer Mother          pancreas  . Diabetes Mother    . Diabetes Brother    . Cancer Brother          prostate   . Diabetes Sister  prediabetic  . Heart disease Brother          CHF  . Colon polyps Neg Hx      Social History         Tobacco Use  . Smoking status: Never Smoker  . Smokeless tobacco: Never Used  . Tobacco comment: Never smoked  Substance Use Topics  . Alcohol use: Yes      Alcohol/week: 0.0 standard drinks      Comment: occasional, maybe once/year  . Drug use: No           Allergies  Allergen Reactions  . Aspirin Other (See Comments)      Intolerant of blood thinners  . Diazepam Other (See Comments)      Aggitation      Active Medications      Current Meds  Medication Sig  . Calcium-Magnesium-Vitamin D (CALCIUM MAGNESIUM PO) Take 1 tablet by mouth daily.  Marland Kitchen levothyroxine (SYNTHROID, LEVOTHROID) 112 MCG tablet TAKE 1 TABLET (112 MCG TOTAL) BY MOUTH DAILY.  Marland Kitchen omeprazole (PRILOSEC) 20 MG capsule TAKE 1 CAPSULE (20 MG TOTAL) BY MOUTH DAILY BEFORE BREAKFAST.        BP 135/89   Pulse 84   Ht 4\' 10"  (1.473 m)   Wt 140 lb (63.5 kg)   BMI 29.26 kg/m    Physical Exam Vitals signs reviewed.  Constitutional:      General: She is not in acute distress.    Appearance: She is well-developed and normal weight. She is not ill-appearing.  Neurological:     Mental Status: She is alert and oriented to person,  place, and time.  Psychiatric:        Judgment: Judgment normal.        Ortho Exam Right elbow: Normal range of motion skin looks great no instability strength is normal no tenderness neurovascular exam is intact no atrophy or tremor   On the left side she has a small nodule at the olecranon it is tender to touch joint motion is normal skin is otherwise clean this area is very tender.  Elbow joint is stable muscle tone and strength are normal there is no tremor   Neurovascular exam is intact     MEDICAL DECISION SECTION  Xrays were done at No new x-rays were done x-ray from 2017 showed a very small area of prominence at the olecranon       Encounter Diagnosis  Name Primary?  Marland Kitchen Olecranon bursitis of left elbow Yes      PLAN: (Rx., injectx, surgery, frx, mri/ct) Patient declines any nonoperative treatments, so we have agreed to go ahead and remove the spur in the subcutaneous mass which could be a neurofibroma.  We will contour the elbow around the olecranon at the same time   She understands that she still may have hypersensitivity to this area after the surgery and she did not want to take a week off of work as I advised to let the wound heal properly   She is excepted the risks of wound problem after surgery   Plan for left olecranon bursectomy and spur removal   No orders of the defined types were placed in this encounter.     Arther Abbott, MD

## 2018-07-01 ENCOUNTER — Other Ambulatory Visit: Payer: Self-pay

## 2018-07-01 ENCOUNTER — Encounter (HOSPITAL_COMMUNITY)
Admission: RE | Disposition: A | Discharge status: Home / Self Care | Payer: Self-pay | Source: Home / Self Care | Attending: Orthopedic Surgery

## 2018-07-01 ENCOUNTER — Ambulatory Visit (HOSPITAL_COMMUNITY): Payer: PPO | Admitting: Anesthesiology

## 2018-07-01 ENCOUNTER — Encounter (HOSPITAL_COMMUNITY): Payer: Self-pay | Admitting: Anesthesiology

## 2018-07-01 ENCOUNTER — Ambulatory Visit (HOSPITAL_COMMUNITY)
Admission: RE | Admit: 2018-07-01 | Discharge: 2018-07-01 | Disposition: A | Discharge status: Home / Self Care | Payer: PPO | Attending: Orthopedic Surgery | Admitting: Orthopedic Surgery

## 2018-07-01 DIAGNOSIS — E669 Obesity, unspecified: Secondary | ICD-10-CM | POA: Diagnosis not present

## 2018-07-01 DIAGNOSIS — M778 Other enthesopathies, not elsewhere classified: Secondary | ICD-10-CM | POA: Insufficient documentation

## 2018-07-01 DIAGNOSIS — K449 Diaphragmatic hernia without obstruction or gangrene: Secondary | ICD-10-CM | POA: Insufficient documentation

## 2018-07-01 DIAGNOSIS — E039 Hypothyroidism, unspecified: Secondary | ICD-10-CM | POA: Diagnosis not present

## 2018-07-01 DIAGNOSIS — Z833 Family history of diabetes mellitus: Secondary | ICD-10-CM | POA: Insufficient documentation

## 2018-07-01 DIAGNOSIS — F419 Anxiety disorder, unspecified: Secondary | ICD-10-CM | POA: Diagnosis not present

## 2018-07-01 DIAGNOSIS — I2510 Atherosclerotic heart disease of native coronary artery without angina pectoris: Secondary | ICD-10-CM | POA: Insufficient documentation

## 2018-07-01 DIAGNOSIS — Z8042 Family history of malignant neoplasm of prostate: Secondary | ICD-10-CM | POA: Diagnosis not present

## 2018-07-01 DIAGNOSIS — M810 Age-related osteoporosis without current pathological fracture: Secondary | ICD-10-CM | POA: Insufficient documentation

## 2018-07-01 DIAGNOSIS — Z888 Allergy status to other drugs, medicaments and biological substances status: Secondary | ICD-10-CM | POA: Insufficient documentation

## 2018-07-01 DIAGNOSIS — R102 Pelvic and perineal pain: Secondary | ICD-10-CM | POA: Diagnosis not present

## 2018-07-01 DIAGNOSIS — Z79899 Other long term (current) drug therapy: Secondary | ICD-10-CM | POA: Insufficient documentation

## 2018-07-01 DIAGNOSIS — Z6829 Body mass index (BMI) 29.0-29.9, adult: Secondary | ICD-10-CM | POA: Insufficient documentation

## 2018-07-01 DIAGNOSIS — Z886 Allergy status to analgesic agent status: Secondary | ICD-10-CM | POA: Diagnosis not present

## 2018-07-01 DIAGNOSIS — Z8249 Family history of ischemic heart disease and other diseases of the circulatory system: Secondary | ICD-10-CM | POA: Diagnosis not present

## 2018-07-01 DIAGNOSIS — G8929 Other chronic pain: Secondary | ICD-10-CM | POA: Insufficient documentation

## 2018-07-01 DIAGNOSIS — Z90710 Acquired absence of both cervix and uterus: Secondary | ICD-10-CM | POA: Diagnosis not present

## 2018-07-01 DIAGNOSIS — Z800 Family history of malignant neoplasm of digestive organs: Secondary | ICD-10-CM | POA: Diagnosis not present

## 2018-07-01 DIAGNOSIS — K219 Gastro-esophageal reflux disease without esophagitis: Secondary | ICD-10-CM | POA: Diagnosis not present

## 2018-07-01 DIAGNOSIS — M7022 Olecranon bursitis, left elbow: Secondary | ICD-10-CM | POA: Diagnosis not present

## 2018-07-01 DIAGNOSIS — K589 Irritable bowel syndrome without diarrhea: Secondary | ICD-10-CM | POA: Insufficient documentation

## 2018-07-01 HISTORY — PX: OLECRANON BURSECTOMY: SHX2097

## 2018-07-01 SURGERY — BURSECTOMY, ELBOW
Anesthesia: General | Laterality: Left

## 2018-07-01 MED ORDER — MEPERIDINE HCL 50 MG/ML IJ SOLN: 6.2500 mg | INTRAMUSCULAR | Status: DC | PRN

## 2018-07-01 MED ORDER — DEXAMETHASONE SODIUM PHOSPHATE 10 MG/ML IJ SOLN
INTRAMUSCULAR | Status: DC | PRN
  Administered 2018-07-01: 10 mg via INTRAVENOUS

## 2018-07-01 MED ORDER — PROPOFOL 10 MG/ML IV BOLUS
INTRAVENOUS | Status: DC | PRN
  Administered 2018-07-01: 150 mg via INTRAVENOUS

## 2018-07-01 MED ORDER — FENTANYL CITRATE (PF) 100 MCG/2ML IJ SOLN
INTRAMUSCULAR | Status: DC | PRN
  Administered 2018-07-01 (×2): 25 ug via INTRAVENOUS
  Administered 2018-07-01: 50 ug via INTRAVENOUS

## 2018-07-01 MED ORDER — IBUPROFEN 800 MG PO TABS: 800.0000 mg | ORAL_TABLET | Freq: Three times a day (TID) | ORAL | 0 refills | Status: DC | PRN

## 2018-07-01 MED ORDER — LACTATED RINGERS IV SOLN
INTRAVENOUS | Status: DC
  Administered 2018-07-01: 09:00:00 via INTRAVENOUS

## 2018-07-01 MED ORDER — LIDOCAINE 2% (20 MG/ML) 5 ML SYRINGE
INTRAMUSCULAR | Status: AC
  Filled 2018-07-01: qty 5

## 2018-07-01 MED ORDER — BUPIVACAINE-EPINEPHRINE (PF) 0.25% -1:200000 IJ SOLN
INTRAMUSCULAR | Status: DC | PRN
  Administered 2018-07-01: 20 mL

## 2018-07-01 MED ORDER — ONDANSETRON 4 MG PO TBDP
8.0000 mg | ORAL_TABLET | Freq: Once | ORAL | Status: AC
  Administered 2018-07-01: 8 mg via ORAL

## 2018-07-01 MED ORDER — HYDROMORPHONE HCL 1 MG/ML IJ SOLN
0.2500 mg | INTRAMUSCULAR | Status: DC | PRN
  Administered 2018-07-01: 0.5 mg via INTRAVENOUS
  Filled 2018-07-01: qty 0.5

## 2018-07-01 MED ORDER — ONDANSETRON HCL 4 MG/2ML IJ SOLN: 4.0000 mg | Freq: Once | INTRAMUSCULAR | Status: DC | PRN

## 2018-07-01 MED ORDER — BUPIVACAINE-EPINEPHRINE (PF) 0.25% -1:200000 IJ SOLN
INTRAMUSCULAR | Status: AC
  Filled 2018-07-01: qty 30

## 2018-07-01 MED ORDER — SODIUM CHLORIDE 0.9 % IR SOLN
Status: DC | PRN
  Administered 2018-07-01: 1000 mL

## 2018-07-01 MED ORDER — GLYCOPYRROLATE PF 0.2 MG/ML IJ SOSY
PREFILLED_SYRINGE | INTRAMUSCULAR | Status: AC
  Filled 2018-07-01: qty 1

## 2018-07-01 MED ORDER — GLYCOPYRROLATE PF 0.2 MG/ML IJ SOSY
PREFILLED_SYRINGE | INTRAMUSCULAR | Status: DC | PRN
  Administered 2018-07-01: .2 mg via INTRAVENOUS

## 2018-07-01 MED ORDER — CHLORHEXIDINE GLUCONATE 4 % EX LIQD: 60.0000 mL | Freq: Once | CUTANEOUS | Status: DC

## 2018-07-01 MED ORDER — ONDANSETRON 4 MG PO TBDP
ORAL_TABLET | ORAL | Status: AC
  Filled 2018-07-01: qty 2

## 2018-07-01 MED ORDER — DEXAMETHASONE SODIUM PHOSPHATE 10 MG/ML IJ SOLN
INTRAMUSCULAR | Status: AC
  Filled 2018-07-01: qty 1

## 2018-07-01 MED ORDER — LIDOCAINE 2% (20 MG/ML) 5 ML SYRINGE
INTRAMUSCULAR | Status: DC | PRN
  Administered 2018-07-01: 40 mg via INTRAVENOUS

## 2018-07-01 MED ORDER — ONDANSETRON HCL 4 MG/2ML IJ SOLN
INTRAMUSCULAR | Status: DC | PRN
  Administered 2018-07-01: 4 mg via INTRAVENOUS

## 2018-07-01 MED ORDER — HYDROCODONE-ACETAMINOPHEN 7.5-325 MG PO TABS: 1.0000 | ORAL_TABLET | Freq: Once | ORAL | Status: DC | PRN

## 2018-07-01 MED ORDER — CEFAZOLIN SODIUM-DEXTROSE 2-4 GM/100ML-% IV SOLN
2.0000 g | INTRAVENOUS | Status: AC
  Administered 2018-07-01: 2 g via INTRAVENOUS

## 2018-07-01 MED ORDER — FENTANYL CITRATE (PF) 250 MCG/5ML IJ SOLN
INTRAMUSCULAR | Status: AC
  Filled 2018-07-01: qty 5

## 2018-07-01 MED ORDER — PHENYLEPHRINE HCL 10 MG/ML IJ SOLN
INTRAMUSCULAR | Status: DC | PRN
  Administered 2018-07-01: 80 ug via INTRAVENOUS

## 2018-07-01 MED ORDER — ONDANSETRON HCL 4 MG/2ML IJ SOLN
INTRAMUSCULAR | Status: AC
  Filled 2018-07-01: qty 2

## 2018-07-01 MED ORDER — SUCCINYLCHOLINE CHLORIDE 200 MG/10ML IV SOSY
PREFILLED_SYRINGE | INTRAVENOUS | Status: AC
  Filled 2018-07-01: qty 10

## 2018-07-01 MED ORDER — CEFAZOLIN SODIUM-DEXTROSE 2-4 GM/100ML-% IV SOLN
INTRAVENOUS | Status: AC
  Filled 2018-07-01: qty 100

## 2018-07-01 MED ORDER — KETOROLAC TROMETHAMINE 30 MG/ML IJ SOLN
30.0000 mg | Freq: Once | INTRAMUSCULAR | Status: AC | PRN
  Administered 2018-07-01: 30 mg via INTRAVENOUS
  Filled 2018-07-01: qty 1

## 2018-07-01 MED ORDER — PROPOFOL 10 MG/ML IV BOLUS
INTRAVENOUS | Status: AC
  Filled 2018-07-01: qty 40

## 2018-07-01 SURGICAL SUPPLY — 46 items
BANDAGE ELASTIC 3 LF NS (GAUZE/BANDAGES/DRESSINGS) ×2 IMPLANT
BANDAGE ELASTIC 4 LF NS (GAUZE/BANDAGES/DRESSINGS) ×1 IMPLANT
BANDAGE ESMARK 4X12 BL STRL LF (DISPOSABLE) ×1 IMPLANT
BNDG CMPR 12X4 ELC STRL LF (DISPOSABLE) ×1
BNDG CMPR MED 5X3 ELC HKLP NS (GAUZE/BANDAGES/DRESSINGS) ×1
BNDG CMPR MED 5X4 ELC HKLP NS (GAUZE/BANDAGES/DRESSINGS) ×1
BNDG COHESIVE 4X5 TAN STRL (GAUZE/BANDAGES/DRESSINGS) ×1 IMPLANT
BNDG ESMARK 4X12 BLUE STRL LF (DISPOSABLE) ×2
BNDG GAUZE ELAST 4 BULKY (GAUZE/BANDAGES/DRESSINGS) ×2 IMPLANT
CHLORAPREP W/TINT 26ML (MISCELLANEOUS) ×2 IMPLANT
CLOTH BEACON ORANGE TIMEOUT ST (SAFETY) ×2 IMPLANT
COVER LIGHT HANDLE STERIS (MISCELLANEOUS) ×4 IMPLANT
COVER WAND RF STERILE (DRAPES) ×1 IMPLANT
CUFF TOURNIQUET SINGLE 18IN (TOURNIQUET CUFF) ×1 IMPLANT
DECANTER SPIKE VIAL GLASS SM (MISCELLANEOUS) ×2 IMPLANT
DRAPE HALF SHEET 40X57 (DRAPES) ×2 IMPLANT
ELECT REM PT RETURN 9FT ADLT (ELECTROSURGICAL) ×2
ELECTRODE REM PT RTRN 9FT ADLT (ELECTROSURGICAL) ×1 IMPLANT
GAUZE SPONGE 4X4 12PLY STRL (GAUZE/BANDAGES/DRESSINGS) ×1 IMPLANT
GAUZE XEROFORM 5X9 LF (GAUZE/BANDAGES/DRESSINGS) ×1 IMPLANT
GLOVE BIOGEL M 7.0 STRL (GLOVE) ×1 IMPLANT
GLOVE BIOGEL PI IND STRL 7.0 (GLOVE) ×1 IMPLANT
GLOVE BIOGEL PI INDICATOR 7.0 (GLOVE) ×3
GLOVE ECLIPSE 6.5 STRL STRAW (GLOVE) ×1 IMPLANT
GLOVE SKINSENSE NS SZ8.0 LF (GLOVE) ×1
GLOVE SKINSENSE STRL SZ8.0 LF (GLOVE) ×1 IMPLANT
GLOVE SS N UNI LF 8.5 STRL (GLOVE) ×2 IMPLANT
GOWN STRL REUS W/TWL LRG LVL3 (GOWN DISPOSABLE) ×2 IMPLANT
GOWN STRL REUS W/TWL XL LVL3 (GOWN DISPOSABLE) ×2 IMPLANT
INST SET MINOR BONE (KITS) ×2 IMPLANT
KIT TURNOVER KIT A (KITS) ×2 IMPLANT
MANIFOLD NEPTUNE II (INSTRUMENTS) ×2 IMPLANT
NDL HYPO 21X1.5 SAFETY (NEEDLE) ×1 IMPLANT
NEEDLE HYPO 21X1.5 SAFETY (NEEDLE) ×2 IMPLANT
NS IRRIG 1000ML POUR BTL (IV SOLUTION) ×2 IMPLANT
PACK BASIC LIMB (CUSTOM PROCEDURE TRAY) ×2 IMPLANT
PAD ARMBOARD 7.5X6 YLW CONV (MISCELLANEOUS) ×2 IMPLANT
SET BASIN LINEN APH (SET/KITS/TRAYS/PACK) ×2 IMPLANT
SLING ARM MED ADULT FOAM STRAP (SOFTGOODS) ×1 IMPLANT
SPONGE LAP 18X18 RF (DISPOSABLE) ×2 IMPLANT
SUT ETHILON 3 0 FSL (SUTURE) ×2 IMPLANT
SUT MON AB 2-0 SH 27 (SUTURE) ×2
SUT MON AB 2-0 SH27 (SUTURE) ×1 IMPLANT
SUT VIC AB 1 CT1 27 (SUTURE) ×2
SUT VIC AB 1 CT1 27XBRD ANTBC (SUTURE) IMPLANT
SYR BULB IRRIGATION 50ML (SYRINGE) ×2 IMPLANT

## 2018-07-01 NOTE — Anesthesia Postprocedure Evaluation (Signed)
Anesthesia Post Note  Patient: Kim Grimes  Procedure(s) Performed: LEFT ELBOW OLECRANON BURSECTOMY AND SPUR EXCISION (Left )  Patient location during evaluation: PACU Anesthesia Type: General Level of consciousness: awake and alert and oriented Pain management: pain level controlled Vital Signs Assessment: post-procedure vital signs reviewed and stable Respiratory status: spontaneous breathing Cardiovascular status: stable Postop Assessment: no apparent nausea or vomiting Anesthetic complications: no     Last Vitals:  Vitals:   07/01/18 1051 07/01/18 1100  BP: (!) 144/86 (!) 143/79  Pulse: 80 74  Resp: 12 19  Temp: 36.5 C   SpO2: 96% 93%    Last Pain:  Vitals:   07/01/18 1100  TempSrc:   PainSc: 4                  ADAMS, AMY A

## 2018-07-01 NOTE — Brief Op Note (Signed)
07/01/2018  10:42 AM  PATIENT:  Kim Grimes  68 y.o. female  PRE-OPERATIVE DIAGNOSIS:  Left elbow bursitis and spur left olecranon  POST-OPERATIVE DIAGNOSIS:  Left elbow bursitis and spur left olecranon  PROCEDURE:  Procedure(s): LEFT ELBOW OLECRANON BURSECTOMY AND SPUR EXCISION (Left)   FINDINGS: Small olecranon bursal sac with small spur at the tip of the olecranon  Surgery done as follows  The patient was seen in preop skin was checked it was clean charting was completed site was marked confirmed and patient taken to surgery.  Ancef was given preoperatively.  General anesthesia was administered.  The left arm was prepped and draped sterilely.  Tourniquet was placed on the arm prior to prep and drape.  Timeout was completed.  A slightly curved incision was made around the olecranon extended proximally and distally in linear fashion subcutaneous tissue was divided bursa was excised.  Radiographs were checked and the spur was identified after splitting the olecranon attachment of the triceps tendon.  A rongeur was used to remove the spur.  The area was contoured with a hand rasp and then irrigated.  The split in the tendon was not sutured to decrease suture bulk in the area.  After thorough irrigation of the wound 2-0 interrupted Vicryl sutures were placed followed by running 3-0 nylon suture and quarter percent Marcaine with epinephrine injected 20 cc.  Sterile bandage was applied tourniquet was released Kerlix and Ace bandage were used to wrap the arm in a sling was applied  Assisted by [name] with general anesthetic  SURGEON:  Surgeon(s) and Role:    * [name], MD - Primary   EBL:  5 mL   BLOOD ADMINISTERED:none  DRAINS: none   LOCAL MEDICATIONS USED:  MARCAINE     SPECIMEN:  Source of Specimen:  Olecranon bursa and 2 specimens including subcutaneous fatty tissue  DISPOSITION OF SPECIMEN:  PATHOLOGY  COUNTS:  YES  TOURNIQUET:   Total Tourniquet  Time Documented: Upper Arm (Left) - -122482 minutes Total: Upper Arm (Left) - -500370 minutes   DICTATION: .Viviann Spare Dictation  PLAN OF CARE: Discharge to home after PACU  PATIENT DISPOSITION:  PACU - hemodynamically stable.   Delay start of Pharmacological VTE agent (>24hrs) due to surgical blood loss or risk of bleeding: not applicable

## 2018-07-01 NOTE — Op Note (Signed)
07/01/2018  10:42 AM  PATIENT:  Kim Grimes  68 y.o. female  PRE-OPERATIVE DIAGNOSIS:  Left elbow bursitis and spur left olecranon  POST-OPERATIVE DIAGNOSIS:  Left elbow bursitis and spur left olecranon  PROCEDURE:  Procedure(s): LEFT ELBOW OLECRANON BURSECTOMY AND SPUR EXCISION (Left) - [id number]  FINDINGS: Small olecranon bursal sac with small spur at the tip of the olecranon  Surgery done as follows  The patient was seen in preop skin was checked it was clean charting was completed site was marked confirmed and patient taken to surgery.  Ancef was given preoperatively.  General anesthesia was administered.  The left arm was prepped and draped sterilely.  Tourniquet was placed on the arm prior to prep and drape.  Timeout was completed.  A slightly curved incision was made around the olecranon extended proximally and distally in linear fashion subcutaneous tissue was divided bursa was excised.  Radiographs were checked and the spur was identified after splitting the olecranon attachment of the triceps tendon.  A rongeur was used to remove the spur.  The area was contoured with a hand rasp and then irrigated.  The split in the tendon was not sutured to decrease suture bulk in the area.  After thorough irrigation of the wound 2-0 interrupted Vicryl sutures were placed followed by running 3-0 nylon suture and quarter percent Marcaine with epinephrine injected 20 cc.  Sterile bandage was applied tourniquet was released Kerlix and Ace bandage were used to wrap the arm in a sling was applied  Assisted by [name] with general anesthetic  SURGEON:  Surgeon(s) and Role:    * [name], MD - Primary   EBL:  5 mL   BLOOD ADMINISTERED:none  DRAINS: none   LOCAL MEDICATIONS USED:  MARCAINE     SPECIMEN:  Source of Specimen:  Olecranon bursa and 2 specimens including subcutaneous fatty tissue  DISPOSITION OF SPECIMEN:  PATHOLOGY  COUNTS:  YES  TOURNIQUET:   Total  Tourniquet Time Documented: Upper Arm (Left) - -203559 minutes Total: Upper Arm (Left) - -741638 minutes   DICTATION: .Viviann Spare Dictation  PLAN OF CARE: Discharge to home after PACU  PATIENT DISPOSITION:  PACU - hemodynamically stable.   Delay start of Pharmacological VTE agent (>24hrs) due to surgical blood loss or risk of bleeding: not applicable

## 2018-07-01 NOTE — Interval H&P Note (Signed)
History and Physical Interval Note:  07/01/2018 9:40 AM  Kim Grimes  has presented today for surgery, with the diagnosis of Left elbow bursitis and spur left olecranon  The various methods of treatment have been discussed with the patient and family. After consideration of risks, benefits and other options for treatment, the patient has consented to  Procedure(s): [place] (Left) as a surgical intervention .  The patient's history has been reviewed, patient examined, no change in status, stable for surgery.  I have reviewed the patient's chart and labs.  Questions were answered to the patient's satisfaction.     Arther Abbott

## 2018-07-01 NOTE — Transfer of Care (Signed)
Immediate Anesthesia Transfer of Care Note  Patient: Kim Grimes  Procedure(s) Performed: LEFT ELBOW OLECRANON BURSECTOMY AND SPUR EXCISION (Left )  Patient Location: PACU  Anesthesia Type:General  Level of Consciousness: awake, alert , oriented and patient cooperative  Airway & Oxygen Therapy: Patient Spontanous Breathing  Post-op Assessment: Report given to RN and Post -op Vital signs reviewed and stable  Post vital signs: Reviewed and stable  Last Vitals:  Vitals Value Taken Time  BP 144/86 07/01/2018 10:51 AM  Temp 36.5 C 07/01/2018 10:51 AM  Pulse 83 07/01/2018 10:53 AM  Resp 12 07/01/2018 10:53 AM  SpO2 96 % 07/01/2018 10:53 AM  Vitals shown include unvalidated device data.  Last Pain:  Vitals:   07/01/18 1051  TempSrc:   PainSc: (P) Asleep      Patients Stated Pain Goal: 9 (32/91/91 6606)  Complications: No apparent anesthesia complications

## 2018-07-01 NOTE — Anesthesia Procedure Notes (Signed)
Procedure Name: LMA Insertion Date/Time: 07/01/2018 9:54 AM Performed by: Andree Elk, Amy A, CRNA Pre-anesthesia Checklist: Patient identified, Emergency Drugs available, Suction available, Patient being monitored and Timeout performed Patient Re-evaluated:Patient Re-evaluated prior to induction Oxygen Delivery Method: Simple face mask Preoxygenation: Pre-oxygenation with 100% oxygen Induction Type: IV induction Ventilation: Mask ventilation with difficulty LMA: LMA inserted LMA Size: 3.0 Number of attempts: 1 Placement Confirmation: positive ETCO2 and breath sounds checked- equal and bilateral Tube secured with: Tape Dental Injury: Teeth and Oropharynx as per pre-operative assessment

## 2018-07-01 NOTE — Discharge Instructions (Signed)
The surgery was very successful in removing the spur and the bursal tissue around it  You can remove your dressing on January 4 and apply 4 x 4's and tape to cover it.  You can start moving your arm immediately the sling is only for comfort you can take it off at any point  You can take ibuprofen or Tylenol for pain  You can shower if the wound is covered and Dry starting January 4  The stitches will be taken out on January 17 please call the office for an appointment

## 2018-07-01 NOTE — Anesthesia Preprocedure Evaluation (Signed)
Anesthesia Evaluation  Patient identified by MRN, date of birth, ID band Patient awake    Reviewed: Allergy & Precautions, H&P , NPO status , Patient's Chart, lab work & pertinent test results, reviewed documented beta blocker date and time   History of Anesthesia Complications (+) PONV and history of anesthetic complications  Airway Mallampati: II  TM Distance: >3 FB Neck ROM: full    Dental no notable dental hx.    Pulmonary neg pulmonary ROS,    Pulmonary exam normal breath sounds clear to auscultation       Cardiovascular Exercise Tolerance: Good + CAD   Rhythm:regular Rate:Normal     Neuro/Psych PSYCHIATRIC DISORDERS Anxiety negative neurological ROS     GI/Hepatic Neg liver ROS, hiatal hernia, GERD  ,  Endo/Other  Hypothyroidism   Renal/GU negative Renal ROS  negative genitourinary   Musculoskeletal   Abdominal   Peds  Hematology negative hematology ROS (+)   Anesthesia Other Findings   Reproductive/Obstetrics negative OB ROS                             Anesthesia Physical Anesthesia Plan  ASA: II  Anesthesia Plan: General   Post-op Pain Management:    Induction:   PONV Risk Score and Plan:   Airway Management Planned:   Additional Equipment:   Intra-op Plan:   Post-operative Plan:   Informed Consent: I have reviewed the patients History and Physical, chart, labs and discussed the procedure including the risks, benefits and alternatives for the proposed anesthesia with the patient or authorized representative who has indicated his/her understanding and acceptance.   Dental Advisory Given  Plan Discussed with: CRNA  Anesthesia Plan Comments:         Anesthesia Quick Evaluation

## 2018-07-02 ENCOUNTER — Other Ambulatory Visit: Payer: Self-pay | Admitting: "Endocrinology

## 2018-07-02 ENCOUNTER — Encounter (HOSPITAL_COMMUNITY): Payer: Self-pay | Admitting: Orthopedic Surgery

## 2018-07-02 DIAGNOSIS — E039 Hypothyroidism, unspecified: Secondary | ICD-10-CM | POA: Diagnosis not present

## 2018-07-03 LAB — TSH: TSH: 1.75 u[IU]/mL (ref 0.450–4.500)

## 2018-07-03 LAB — T4, FREE: Free T4: 1.28 ng/dL (ref 0.82–1.77)

## 2018-07-05 ENCOUNTER — Encounter: Payer: Self-pay | Admitting: "Endocrinology

## 2018-07-05 ENCOUNTER — Ambulatory Visit (INDEPENDENT_AMBULATORY_CARE_PROVIDER_SITE_OTHER): Payer: PPO | Admitting: "Endocrinology

## 2018-07-05 ENCOUNTER — Ambulatory Visit: Payer: PPO | Admitting: "Endocrinology

## 2018-07-05 VITALS — BP 138/86 | HR 90 | Ht <= 58 in | Wt 147.0 lb

## 2018-07-05 DIAGNOSIS — E039 Hypothyroidism, unspecified: Secondary | ICD-10-CM

## 2018-07-05 MED ORDER — LEVOTHYROXINE SODIUM 112 MCG PO TABS: 112.0000 ug | ORAL_TABLET | Freq: Every day | ORAL | 4 refills | Status: DC

## 2018-07-05 NOTE — Progress Notes (Signed)
Endocrinology follow-up note  Subjective:    Patient ID: Kim Grimes, female    DOB: Jan 04, 1951, PCP Fayrene Helper, MD   Past Medical History:  Diagnosis Date  . Anxiety disorder   . Carotid artery stenosis   . Chronic pelvic pain in female   . Coronary artery disease   . Endometriosis   . GERD (gastroesophageal reflux disease)   . Helicobacter pylori gastritis    Dr. Olevia Perches   . Hiatal hernia 2003   on UGI   . Hypothyroidism 2009  . IBS (irritable bowel syndrome) 2003  . Obesity (BMI 30-39.9) DEC 2011 145 LBS  . Osteoporosis   . PONV (postoperative nausea and vomiting)    Past Surgical History:  Procedure Laterality Date  . ABDOMINAL HYSTERECTOMY    . adhesiolysis     dense adhesion between rectum and sigmoid,  & pelvis   . BRAVO [place] STUDY  01/19/2012   Procedure: BRAVO [place];  Surgeon: [name], MD;  [facility];  Service: Endoscopy;;  . COLONOSCOPY  03: [facility]/D & 08: SCREENING   WNL'S  . COLONOSCOPY  07/28/2011   sessile polyp in ascending colon/internal hemorrhoids. Next colonoscopy January 2018  . COLONOSCOPY N/A 08/01/2016   Procedure: COLONOSCOPY;  Surgeon: [name], MD;  [facility];  Service: Endoscopy;  Laterality: N/A;  930   . ESOPHAGOGASTRODUODENOSCOPY  01/19/2012   SLF: SMALL Hiatal hernia/Mild gastritis  . EYE SURGERY Right 10/18/2013   catarat extraction  . LAPAROSCOPY    . OLECRANON BURSECTOMY Left 07/01/2018   Procedure: LEFT ELBOW OLECRANON BURSECTOMY AND SPUR EXCISION;  Surgeon: [name], MD;  Location: [facility];  Service: Orthopedics;  Laterality: Left;  . rt foot sx Bilateral   . s/p hysterectomy    . SALPINGOOPHORECTOMY    . UPPER GASTROINTESTINAL ENDOSCOPY  DEC 2010: DYSPEPSIA/DYSPHAGIA   [place] RING/ESO DIL 16 MM, GASTRITIS/DUODENITIS 2o to Aleve/on OMP PRN   Social History   Socioeconomic History  . Marital status: Divorced    Spouse name: Not on file  . Number of children: Not on file   . Years of education: Not on file  . Highest education level: Not on file  Occupational History  . Occupation: fulltime at [employer]  . Financial resource strain: Not hard at all  . Food insecurity:    Worry: Never true    Inability: Never true  . Transportation needs:    Medical: No    Non-medical: No  Tobacco Use  . Smoking status: Never Smoker  . Smokeless tobacco: Never Used  . Tobacco comment: Never smoked  Substance and Sexual Activity  . Alcohol use: Yes    Alcohol/week: 0.0 standard drinks    Comment: occasional, maybe once/year  . Drug use: No  . Sexual activity: Not Currently    Birth control/protection: None  Lifestyle  . Physical activity:    Days per week: 4 days    Minutes per session: 60 min  . Stress: Only a little  Relationships  . Social connections:    Talks on phone: Three times a week    Gets together: Three times a week    Attends religious service: More than 4 times per year    Active member of club or organization: Yes    Attends meetings of clubs or organizations: Never    Relationship status: Divorced  Other Topics Concern  . Not on file  Social History Narrative  . Not on file   Outpatient Encounter Medications as of 07/05/2018  Medication Sig  . Calcium-Magnesium-Vitamin D (CALCIUM MAGNESIUM PO) Take 1 tablet by mouth daily.  Marland Kitchen levothyroxine (SYNTHROID, LEVOTHROID) 112 MCG tablet Take 1 tablet (112 mcg total) by mouth daily.  Marland Kitchen omeprazole (PRILOSEC) 20 MG capsule TAKE 1 CAPSULE (20 MG TOTAL) BY MOUTH DAILY BEFORE BREAKFAST.  . [DISCONTINUED] ibuprofen (ADVIL,MOTRIN) 800 MG tablet Take 1 tablet (800 mg total) by mouth every 8 (eight) hours as needed.  . [DISCONTINUED] levothyroxine (SYNTHROID, LEVOTHROID) 112 MCG tablet TAKE 1 TABLET (112 MCG TOTAL) BY MOUTH DAILY.   No facility-administered encounter medications on file as of 07/05/2018.    ALLERGIES: Allergies  Allergen Reactions  . Aspirin Other (See  Comments)    Intolerant of blood thinners  . Diazepam Other (See Comments)    Aggitation   VACCINATION STATUS: Immunization History  Administered Date(s) Administered  . Influenza,inj,Quad PF,6+ Mos 04/13/2013, 05/09/2014, 03/23/2015, 02/16/2018  . Pneumococcal Conjugate-13 03/15/2018  . Tdap 09/18/2011  . Zoster 09/18/2011    HPI   68 yr old female with medical hx as above. She is here to f/u with repeat labs. -She has taken her levothyroxine in better consistency than in previous visits.  She is currently on levothyroxine 112 mcg p.o. nightly.  She has lost 5 pounds since last visit, denies any palpitations, tremors, nor heat intolerance.   she denies goiter. she has family hx of hypothyroidism. she denies any exposure to neck radiation.   Review of Systems Constitutional: + weight gain, -  fatigue, no subjective hyperthermia/hypothermia Eyes: no blurry vision, no xerophthalmia ENT: no sore throat, no nodules palpated in throat, no dysphagia/odynophagia, no hoarseness Cardiovascular: no CP/SOB/palpitations/leg swelling Musculoskeletal: no muscle/joint aches Skin: no rashes Neurological: no tremors/numbness/tingling/dizziness Psychiatric: no depression/anxiety  Objective:    BP 138/86   Pulse 90   Ht 4\' 10"  (1.473 m)   Wt 147 lb (66.7 kg)   BMI 30.72 kg/m   Wt Readings from Last 3 Encounters:  07/05/18 147 lb (66.7 kg)  07/01/18 140 lb (63.5 kg)  06/22/18 140 lb (63.5 kg)    Physical Exam Constitutional: Not in acute distress Eyes: PERRLA, EOMI, no exophthalmos ENT: moist mucous membranes, no thyromegaly, no cervical lymphadenopathy Musculoskeletal: no deformities, strength intact in all 4 Skin: moist, warm, no rashes Neurological: no tremor with outstretched hands, DTR normal in all 4     Recent Results (from the past 2160 hour(s))  Basic metabolic panel     Status: None   Collection Time: 06/22/18 11:03 AM  Result Value Ref Range   Sodium 140 135 - 145  mmol/L   Potassium 3.9 3.5 - 5.1 mmol/L   Chloride 110 98 - 111 mmol/L   CO2 25 22 - 32 mmol/L   Glucose, Bld 87 70 - 99 mg/dL   BUN 21 8 - 23 mg/dL   Creatinine, Ser 0.87 0.44 - 1.00 mg/dL   Calcium 8.9 8.9 - 10.3 mg/dL   GFR calc non Af Amer >60 >60 mL/min   GFR calc Af Amer >60 >60 mL/min   Anion gap 5 5 - 15    Comment: Performed at [facility], [address], [place], [place] [address]  CBC WITH DIFFERENTIAL     Status: None   Collection Time: 06/22/18 11:03 AM  Result Value Ref Range   WBC 5.0 4.0 - 10.5 K/uL   RBC 4.62 3.87 - 5.11 MIL/uL   Hemoglobin 13.1  12.0 - 15.0 g/dL   HCT 41.7 36.0 - 46.0 %   MCV 90.3 80.0 - 100.0 fL   MCH 28.4 26.0 - 34.0 pg   MCHC 31.4 30.0 - 36.0 g/dL   RDW 12.9 11.5 - 15.5 %   Platelets 233 150 - 400 K/uL   nRBC 0.0 0.0 - 0.2 %   Neutrophils Relative % 51 %   Neutro Abs 2.6 1.7 - 7.7 K/uL   Lymphocytes Relative 38 %   Lymphs Abs 1.9 0.7 - 4.0 K/uL   Monocytes Relative 8 %   Monocytes Absolute 0.4 0.1 - 1.0 K/uL   Eosinophils Relative 2 %   Eosinophils Absolute 0.1 0.0 - 0.5 K/uL   Basophils Relative 1 %   Basophils Absolute 0.0 0.0 - 0.1 K/uL   Immature Granulocytes 0 %   Abs Immature Granulocytes 0.02 0.00 - 0.07 K/uL    Comment: Performed at [facility], [address], [place], [place] [address]  T4, free     Status: None   Collection Time: 07/02/18  8:31 AM  Result Value Ref Range   Free T4 1.28 0.82 - 1.77 ng/dL  TSH     Status: None   Collection Time: 07/02/18  8:31 AM  Result Value Ref Range   TSH 1.750 0.450 - 4.500 uIU/mL     Assessment & Plan:   1. hypothyroidism -Her recent thyroid function tests are consistent with appropriate replacement.  She is advised to continue levothyroxine 112 mcg p.o. every morning.   - We discussed about correct intake of levothyroxine, at fasting, with water, separated by at least 30 minutes from breakfast, and separated by more than 4 hours from calcium, iron, multivitamins, acid reflux  medications (PPIs). -Patient is made aware of the fact that thyroid hormone replacement is needed for life, dose to be adjusted by periodic monitoring of thyroid function tests. She has no clinical goiter, hence no need for thyroid ultrasound.  - I advised patient to maintain close follow up with Fayrene Helper, MD for primary care needs. Follow up plan: Return in about 1 year (around 07/06/2019) for Follow up with Pre-visit Labs.  Glade Lloyd, MD Phone: [phone]  Fax: [phone]   07/05/2018, 3:10 PM

## 2018-07-16 ENCOUNTER — Encounter: Payer: Self-pay | Admitting: Orthopedic Surgery

## 2018-07-16 ENCOUNTER — Ambulatory Visit (INDEPENDENT_AMBULATORY_CARE_PROVIDER_SITE_OTHER): Payer: PPO | Admitting: Orthopedic Surgery

## 2018-07-16 VITALS — BP 113/71 | HR 85 | Ht <= 58 in | Wt 147.0 lb

## 2018-07-16 DIAGNOSIS — M7022 Olecranon bursitis, left elbow: Secondary | ICD-10-CM

## 2018-07-16 NOTE — Progress Notes (Signed)
Chief Complaint  Patient presents with  . Post-op Follow-up    07/01/18 olecranon bursectomy/ improving     07/01/2018  10:42 AM  PATIENT:  Kim Grimes  68 y.o. female  PRE-OPERATIVE DIAGNOSIS:  Left elbow bursitis and spur left olecranon  POST-OPERATIVE DIAGNOSIS:  Left elbow bursitis and spur left olecranon  PROCEDURE:  Procedure(s): LEFT ELBOW OLECRANON BURSECTOMY AND SPUR EXCISION (Left) - 13244  FINDINGS: Small olecranon bursal sac with small spur at the tip of the olecranon   She had bursectomy bone spur removal and she is complaining of burning and raw feeling over the elbow.  Sutures were extracted wound looks fine she has full range of motion she can start lifting weights in about 2 weeks when I see her in 3 to 4 weeks to reevaluate this burning sensation which I do not have an explanation for

## 2018-08-13 ENCOUNTER — Ambulatory Visit: Payer: PPO | Admitting: Orthopedic Surgery

## 2018-08-18 ENCOUNTER — Ambulatory Visit (INDEPENDENT_AMBULATORY_CARE_PROVIDER_SITE_OTHER): Payer: PPO | Admitting: Orthopedic Surgery

## 2018-08-18 ENCOUNTER — Encounter: Payer: Self-pay | Admitting: Orthopedic Surgery

## 2018-08-18 VITALS — BP 108/65 | HR 73 | Ht <= 58 in | Wt 147.0 lb

## 2018-08-18 DIAGNOSIS — M7022 Olecranon bursitis, left elbow: Secondary | ICD-10-CM

## 2018-08-18 NOTE — Progress Notes (Signed)
GLOBAL PERIOD POST OP APPT   POD # 48  Encounter Diagnosis  Name Primary?  Marland Kitchen Olecranon bursitis of left elbow s/p bursectomy 07/01/18 Yes   68 year old female status post surgery on her left elbow for bursectomy and bone spur removal.  She is doing well has regained her range of motion  Her wound is clean dry and intact  She was released  Encounter Diagnosis  Name Primary?  Marland Kitchen Olecranon bursitis of left elbow s/p bursectomy 07/01/18 Yes    07/01/2018  10:42 AM  PATIENT:  Kim Grimes  68 y.o. female  PRE-OPERATIVE DIAGNOSIS:  Left elbow bursitis and spur left olecranon  POST-OPERATIVE DIAGNOSIS:  Left elbow bursitis and spur left olecranon  PROCEDURE:  Procedure(s): LEFT ELBOW OLECRANON BURSECTOMY AND SPUR EXCISION (Left) - 37542  FINDINGS: Small olecranon bursal sac with small spur at the tip of the olecranon

## 2018-09-06 ENCOUNTER — Ambulatory Visit: Payer: Self-pay | Admitting: "Endocrinology

## 2018-09-13 ENCOUNTER — Ambulatory Visit: Payer: PPO | Admitting: Family Medicine

## 2018-09-22 ENCOUNTER — Encounter: Payer: Self-pay | Admitting: Family Medicine

## 2018-09-22 ENCOUNTER — Other Ambulatory Visit: Payer: Self-pay

## 2018-09-22 ENCOUNTER — Ambulatory Visit (INDEPENDENT_AMBULATORY_CARE_PROVIDER_SITE_OTHER): Payer: PPO | Admitting: Family Medicine

## 2018-09-22 DIAGNOSIS — R519 Headache, unspecified: Secondary | ICD-10-CM

## 2018-09-22 DIAGNOSIS — R51 Headache: Secondary | ICD-10-CM | POA: Diagnosis not present

## 2018-09-22 DIAGNOSIS — J302 Other seasonal allergic rhinitis: Secondary | ICD-10-CM

## 2018-09-22 MED ORDER — FLUTICASONE PROPIONATE 50 MCG/ACT NA SUSP: 2.0000 | Freq: Every day | NASAL | 6 refills | Status: DC

## 2018-09-22 MED ORDER — LEVOCETIRIZINE DIHYDROCHLORIDE 5 MG PO TABS: 5.0000 mg | ORAL_TABLET | Freq: Every evening | ORAL | 2 refills | Status: DC

## 2018-09-22 NOTE — Progress Notes (Signed)
Virtual Visit via Telephone Note  I connected with Kim Grimes on 09/22/18 at 4:00pm by telephone and verified that I am speaking with the correct person using two identifiers.   I discussed the limitations, risks, security and privacy concerns of performing an evaluation and management service by telephone. I also discussed with the patient that there may be a patient responsible charge related to this service. The patient expressed understanding and agreed to proceed.  Location of patient: Home  Location of provider: Office   History of Present Illness:    Kim Grimes is a 68 y.o. female who presents for possible sinus infection. Onset was    and symptoms have been persistent and include:     Symptoms include persistent daily headache.  Comes and goes.  At times she is woken up with it.  Reports that she has some frontal facial pressure and discomfort.  Reports that she has some ear drainage.  Denies having ear pain, denies having runny nose, reports having a stuffy nose/congestion feeling.  Denies having any sputum production.  Occasional cough.  Does not have any shortness of breath, chest pain, wheezing.  Denies having fevers denies having chills.  Reports that she took an allergy medication that she got from the store over-the-counter.  She thinks it might be some but that makes her go to sleep.  It makes her feel better.  Question as to whether or not it is Benadryl.  Went to the pharmacy took her blood pressure it was 139/86.  As she thought that was the cause of her headache.  But this is her usual range.  Also reports some occasional light trouble with her eyes.  Further reports that she does have some drainage coming from her eyes.  Clear when it does, but is not consistent.  Has a history of having allergic rhinitis.  Denies knowing that anything aggravates it.  Does report that it got better with allergy medicine.  Pain when it is occurring is about an 8 out of 10.   Past Medical, Surgical, Social History, Allergies, and Medications have been Reviewed.   Review of Systems  Constitutional: Negative for chills and fever.  HENT: Positive for congestion, ear discharge and sinus pain. Negative for hearing loss, sore throat and tinnitus.   Eyes: Positive for photophobia and discharge.  Respiratory: Positive for cough. Negative for shortness of breath and wheezing.   Cardiovascular: Negative for chest pain and palpitations.  Gastrointestinal: Negative.   Genitourinary: Negative.   Musculoskeletal: Negative for myalgias.  Skin: Negative for itching.  Neurological: Positive for headaches. Negative for dizziness and weakness.  Psychiatric/Behavioral: Negative.   All other systems reviewed and are negative.   Observations/Objective:  Not webex   Assessment and Plan:  1. Seasonal allergies Currently signs and symptoms today on the virtual visit via telemedicine she has allergies.  This is just started about 4 days ago so I would not suspect it to be anything that was driven from questionable having sinusitis.  Advised that she take the medications that I have ordered which are Flonase and Xyzal to see if these help keep her symptoms at [place] since the allergy medicine that she was taking was helping her.  Advised to return to the clinic and/or call if her symptoms worsen or she develops a fever.  She reported that she will call back if these medications do not help her.  2. Acute intractable headache, unspecified headache type Educated her on the  cause of having sinus headaches.  She is also having some stress secondary to the current situation with the virus.  Advised her to try to not stress as much as she can.  And educated her on taking Tylenol as needed for headache.  Follow Up Instructions:   I discussed the assessment and treatment plan with the patient. The patient was provided an opportunity to ask questions and all were answered. The patient  agreed with the plan and demonstrated an understanding of the instructions.   AVS to be mailed. Follow up as needed  The patient was advised to call back or seek an in-person evaluation if the symptoms worsen or if the condition fails to improve as anticipated.  I provided 11 minutes of non-face-to-face time during this encounter.   Perlie Mayo, NP

## 2018-09-22 NOTE — Patient Instructions (Signed)
    Thank you for coming into the office today. I appreciate the opportunity to provide you with the care for your health and wellness. Today we discussed:   Please take your medications as directed. I hope they help you feel better. Take tylenol as needed for headaches. Return or call office is not better within a week or if symptoms worsen.  Allergies can cause a lot of symptoms: watery, itching eyes, runny nose (clear), sneezing, sinus pressure, and headaches. This is not making you contagious to others. You can not spread to others or catch this from others. These symptoms happen after you have been exposed to something that you are allergic to an allergen. Prevention: The best prevention is to avoid the things that you know you are allergic to, for example smoke (cigarette, cigar, wood); pollens and molds; animal dander; dust mites. And indoor inhalants such as cleaning products or aerosol sprays.  Target your bedroom as allergy free by removing carpets, damp mopping floors weekly, hanging washable curtains instead of blinds, removing books and stuffed animals, using foam pillows, and encasing pillows and mattress in plastic. Do not blow your nose too frequently or too hard. It may cause your eardrum to perforate (tear). Blow through both nostrils at the same time to equalize pressure.  Use tissue when you blow your nose. Dispose of them and then wash your hands. If no tissue is available, do the "elbow sneeze" into the bend of your arm (away from your open hands). Always wash your hands.  If able use the [facility] in the house and car to reduce exposure to pollens. Use an air filtration system in your house or buy a small one for your bedroom. Dust your house often, using a cloth and cleaner or polish that keeps the dust from flying into the air. Allergy testing can be done if you have had allergies for a long time and are not doing well on current treatments. Take your medications as directed. If you  find the medications are not working let your healthcare provider know. It might take more than one medication to control allergies, especially, seasonal ones.    [place] YOUR HANDS WELL AND FREQUENTLY. AVOID TOUCHING YOUR FACE, UNLESS YOUR HANDS ARE FRESHLY WASHED.  GET FRESH AIR DAILY. STAY HYDRATED WITH WATER.   It was a pleasure to see you and I look forward to continuing to work together on your health and well-being. Please do not hesitate to call the office if you need care or have questions about your care.  Have a wonderful day and week.  With Gratitude,  Cherly Beach, DNP, AGNP-BC

## 2018-09-23 ENCOUNTER — Encounter: Payer: Self-pay | Admitting: *Deleted

## 2018-09-23 ENCOUNTER — Ambulatory Visit: Payer: PPO | Admitting: Family Medicine

## 2018-10-04 ENCOUNTER — Other Ambulatory Visit: Payer: Self-pay | Admitting: Gastroenterology

## 2018-12-13 ENCOUNTER — Other Ambulatory Visit: Payer: Self-pay

## 2018-12-13 ENCOUNTER — Encounter: Payer: Self-pay | Admitting: Family Medicine

## 2018-12-13 ENCOUNTER — Ambulatory Visit (INDEPENDENT_AMBULATORY_CARE_PROVIDER_SITE_OTHER): Payer: PPO | Admitting: Family Medicine

## 2018-12-13 VITALS — BP 110/80 | HR 63 | Resp 12 | Ht <= 58 in | Wt 140.0 lb

## 2018-12-13 DIAGNOSIS — R7302 Impaired glucose tolerance (oral): Secondary | ICD-10-CM | POA: Diagnosis not present

## 2018-12-13 DIAGNOSIS — J3089 Other allergic rhinitis: Secondary | ICD-10-CM | POA: Diagnosis not present

## 2018-12-13 DIAGNOSIS — E559 Vitamin D deficiency, unspecified: Secondary | ICD-10-CM

## 2018-12-13 DIAGNOSIS — R7989 Other specified abnormal findings of blood chemistry: Secondary | ICD-10-CM

## 2018-12-13 DIAGNOSIS — K219 Gastro-esophageal reflux disease without esophagitis: Secondary | ICD-10-CM | POA: Diagnosis not present

## 2018-12-13 DIAGNOSIS — F439 Reaction to severe stress, unspecified: Secondary | ICD-10-CM

## 2018-12-13 DIAGNOSIS — R7301 Impaired fasting glucose: Secondary | ICD-10-CM

## 2018-12-13 DIAGNOSIS — E039 Hypothyroidism, unspecified: Secondary | ICD-10-CM

## 2018-12-13 NOTE — Patient Instructions (Signed)
F/U in 5.5 months, call if you need me before  Please make changes as you figured out that you need to and continue to listen to your body  c  Fasting lipid, cmp an dEGFr, CBc, TSH and vit D 1 week before next visit  Thanks for choosing [facility], we consider it a privelige to serve you.

## 2018-12-13 NOTE — Progress Notes (Signed)
   Kim Grimes     MRN: [id number]      DOB: [birth year]   HPI Kim Grimes is here for follow up and re-evaluation of chronic medical conditions, medication management and review of any available recent lab and radiology data.  Preventive health is updated, specifically  Cancer screening and Immunization.   Questions or concerns regarding consultations or procedures which the PT has had in the interim are  addressed. The PT denies any adverse reactions to current medications since the last visit.  Intermittent abdominal pain sharp stopped her in her tracks, wondered if related to shoes she wore , in the area where she has a hysterectomy pain is more severe, has upcoming Gyne appointment to assess C/o 2 episodes in , reports being hospitalized in the remote past for last several weeks, where she became overwhelmed with a severe feeling of depression, felt extremely low, not actively suicidal or homicidal, states she has been hospitalized for mental health in the past, no interest in medication or therapy at this time, wil keep in touch and let me know if needed Feels as though the stress of her current job situation worlking 2 jobs is the trigger and she plans to stop one ROS Denies recent fever or chills. Denies uncontrolled sinus pressure, nasal congestion, ear pain or sore throat. Denies chest congestion, productive cough or wheezing. Denies chest pains, palpitations and leg swelling Denies uncontrolled reflux symptoms and takes medication daily Denies dysuria, frequency, hesitancy or incontinence. Denies joint pain, swelling and limitation in mobility. Denies headaches, seizures, numbness, or tingling. . Denies skin break down or rash.   PE  BP 110/80   Pulse 63   Resp 12   Ht 4\' 10"  (1.473 m)   Wt 140 lb 0.6 oz (63.5 kg)   SpO2 99%   BMI 29.27 kg/m   Patient alert and oriented and in no cardiopulmonary distress.  HEENT: No facial asymmetry, EOMI,    Chest: Clear to  auscultation bilaterally.  CVS: S1, S2 no murmurs, no S3.Regular rate.  ABD: Soft non tender.   Ext: No edema  MS: Adequate ROM spine, shoulders, hips and knees.  Skin: Intact, no ulcerations or rash noted.  Psych: Good eye contact, normal affect. Memory intact mildly  anxious not  depressed appearing.  CNS: CN 2-12 intact, power,  normal throughout.no focal deficits noted.   Assessment & Plan  Allergic rhinitis Controlled, no change in medication   Hypothyroidism Managed by Endo , controlled on current med dose  GERD (gastroesophageal reflux disease) Continue daily PPI and f/u with GI as scheduled  IGT (impaired glucose tolerance) Needs updated lab data Limiting carbs and sweets is discussed  Vitamin D deficiency Updated lab needed at/ before next visit.   Stress Reports increased and uncontrolled stress due to long job hours, with recent mental decompensation Recommend stopping one of her 2 jobs or reducing hrs , which she had already decided on

## 2018-12-20 ENCOUNTER — Encounter: Payer: Self-pay | Admitting: Family Medicine

## 2018-12-20 DIAGNOSIS — F439 Reaction to severe stress, unspecified: Secondary | ICD-10-CM | POA: Insufficient documentation

## 2018-12-20 NOTE — Assessment & Plan Note (Signed)
Controlled, no change in medication  

## 2018-12-20 NOTE — Assessment & Plan Note (Signed)
Reports increased and uncontrolled stress due to long job hours, with recent mental decompensation Recommend stopping one of her 2 jobs or reducing hrs , which she had already decided on

## 2018-12-20 NOTE — Assessment & Plan Note (Signed)
Continue daily PPI and f/u with GI as scheduled

## 2018-12-20 NOTE — Assessment & Plan Note (Signed)
Managed by Endo , controlled on current med dose

## 2018-12-20 NOTE — Assessment & Plan Note (Signed)
Updated lab needed at/ before next visit.   

## 2018-12-20 NOTE — Assessment & Plan Note (Signed)
Needs updated lab data Limiting carbs and sweets is discussed

## 2018-12-22 ENCOUNTER — Other Ambulatory Visit: Payer: Self-pay

## 2018-12-22 DIAGNOSIS — J302 Other seasonal allergic rhinitis: Secondary | ICD-10-CM

## 2018-12-22 MED ORDER — LEVOCETIRIZINE DIHYDROCHLORIDE 5 MG PO TABS: 5.0000 mg | ORAL_TABLET | Freq: Every evening | ORAL | 2 refills | Status: DC

## 2018-12-29 DIAGNOSIS — Z01419 Encounter for gynecological examination (general) (routine) without abnormal findings: Secondary | ICD-10-CM | POA: Diagnosis not present

## 2018-12-29 DIAGNOSIS — Z1231 Encounter for screening mammogram for malignant neoplasm of breast: Secondary | ICD-10-CM | POA: Diagnosis not present

## 2019-01-04 ENCOUNTER — Other Ambulatory Visit: Payer: Self-pay | Admitting: Obstetrics and Gynecology

## 2019-01-04 DIAGNOSIS — N6489 Other specified disorders of breast: Secondary | ICD-10-CM

## 2019-01-14 ENCOUNTER — Other Ambulatory Visit: Payer: Self-pay | Admitting: Surgery

## 2019-01-14 DIAGNOSIS — R102 Pelvic and perineal pain: Secondary | ICD-10-CM | POA: Diagnosis not present

## 2019-01-17 ENCOUNTER — Other Ambulatory Visit: Payer: Self-pay

## 2019-01-17 ENCOUNTER — Ambulatory Visit
Admission: RE | Admit: 2019-01-17 | Discharge: 2019-01-17 | Disposition: A | Discharge status: Home / Self Care | Payer: PPO | Source: Ambulatory Visit | Attending: Obstetrics and Gynecology | Admitting: Obstetrics and Gynecology

## 2019-01-17 DIAGNOSIS — R928 Other abnormal and inconclusive findings on diagnostic imaging of breast: Secondary | ICD-10-CM | POA: Diagnosis not present

## 2019-01-17 DIAGNOSIS — N6489 Other specified disorders of breast: Secondary | ICD-10-CM

## 2019-01-17 DIAGNOSIS — N6002 Solitary cyst of left breast: Secondary | ICD-10-CM | POA: Diagnosis not present

## 2019-01-25 ENCOUNTER — Other Ambulatory Visit: Payer: Self-pay | Admitting: Surgery

## 2019-01-25 DIAGNOSIS — R102 Pelvic and perineal pain: Secondary | ICD-10-CM

## 2019-02-04 ENCOUNTER — Other Ambulatory Visit: Payer: Self-pay

## 2019-02-04 ENCOUNTER — Ambulatory Visit
Admission: RE | Admit: 2019-02-04 | Discharge: 2019-02-04 | Disposition: A | Discharge status: Home / Self Care | Payer: PPO | Source: Ambulatory Visit | Attending: Surgery | Admitting: Surgery

## 2019-02-04 DIAGNOSIS — R102 Pelvic and perineal pain: Secondary | ICD-10-CM | POA: Diagnosis not present

## 2019-02-04 MED ORDER — IOPAMIDOL (ISOVUE-300) INJECTION 61%
100.0000 mL | Freq: Once | INTRAVENOUS | Status: AC | PRN
  Administered 2019-02-04: 100 mL via INTRAVENOUS

## 2019-02-17 ENCOUNTER — Other Ambulatory Visit: Payer: Self-pay

## 2019-02-17 ENCOUNTER — Ambulatory Visit: Payer: PPO | Admitting: Family Medicine

## 2019-02-17 ENCOUNTER — Encounter: Payer: Self-pay | Admitting: Family Medicine

## 2019-02-17 ENCOUNTER — Ambulatory Visit (INDEPENDENT_AMBULATORY_CARE_PROVIDER_SITE_OTHER): Payer: PPO | Admitting: Family Medicine

## 2019-02-17 VITALS — BP 110/80 | HR 63 | Resp 12 | Ht <= 58 in | Wt 140.0 lb

## 2019-02-17 DIAGNOSIS — Z0000 Encounter for general adult medical examination without abnormal findings: Secondary | ICD-10-CM

## 2019-02-17 NOTE — Patient Instructions (Signed)
Kim Grimes , Thank you for taking time to come for your Medicare Wellness Visit. I appreciate your ongoing commitment to your health goals. Please review the following plan we discussed and let me know if I can assist you in the future.   Please continue to practice social distancing to keep you, your family, and our community safe.  If you must go out, please wear a Mask and practice good handwashing.  Screening recommendations/referrals: Colonoscopy: Due 2028 Mammogram: Due 12/2019 Bone Density: Completed Recommended yearly ophthalmology/optometry visit for glaucoma screening and checkup Recommended yearly dental visit for hygiene and checkup  Vaccinations: Influenza vaccine: Due Fall 2020 We have them now! Pneumococcal vaccine: Completed 1 of 2 Tdap vaccine: Due 2023 Shingles vaccine: Completed  Advanced directives: Decline-Family knows wishes  Conditions/risks identified: Fall   Next appointment: 05/30/2019   Preventive Care 67 Years and Older, Female Preventive care refers to lifestyle choices and visits with your health care provider that can promote health and wellness. What does preventive care include?  A yearly physical exam. This is also called an annual well check.  Dental exams once or twice a year.  Routine eye exams. Ask your health care provider how often you should have your eyes checked.  Personal lifestyle choices, including:  Daily care of your teeth and gums.  Regular physical activity.  Eating a healthy diet.  Avoiding tobacco and drug use.  Limiting alcohol use.  Practicing safe sex.  Taking low-dose aspirin every day.  Taking vitamin and mineral supplements as recommended by your health care provider. What happens during an annual well check? The services and screenings done by your health care provider during your annual well check will depend on your age, overall health, lifestyle risk factors, and family history of disease.  Counseling  Your health care provider may ask you questions about your:  Alcohol use.  Tobacco use.  Drug use.  Emotional well-being.  Home and relationship well-being.  Sexual activity.  Eating habits.  History of falls.  Memory and ability to understand (cognition).  Work and work Statistician.  Reproductive health. Screening  You may have the following tests or measurements:  Height, weight, and BMI.  Blood pressure.  Lipid and cholesterol levels. These may be checked every 5 years, or more frequently if you are over 77 years old.  Skin check.  Lung cancer screening. You may have this screening every year starting at age 67 if you have a 30-pack-year history of smoking and currently smoke or have quit within the past 15 years.  Fecal occult blood test (FOBT) of the stool. You may have this test every year starting at age 77.  Flexible sigmoidoscopy or colonoscopy. You may have a sigmoidoscopy every 5 years or a colonoscopy every 10 years starting at age 21.  Hepatitis C blood test.  Hepatitis B blood test.  Sexually transmitted disease (STD) testing.  Diabetes screening. This is done by checking your blood sugar (glucose) after you have not eaten for a while (fasting). You may have this done every 1-3 years.  Bone density scan. This is done to screen for osteoporosis. You may have this done starting at age 13.  Mammogram. This may be done every 1-2 years. Talk to your health care provider about how often you should have regular mammograms. Talk with your health care provider about your test results, treatment options, and if necessary, the need for more tests. Vaccines  Your health care provider may recommend certain vaccines, such as:  Influenza vaccine. This is recommended every year.  Tetanus, diphtheria, and acellular pertussis (Tdap, Td) vaccine. You may need a Td booster every 10 years.  Zoster vaccine. You may need this after age 57.   Pneumococcal 13-valent conjugate (PCV13) vaccine. One dose is recommended after age 3.  Pneumococcal polysaccharide (PPSV23) vaccine. One dose is recommended after age 22. Talk to your health care provider about which screenings and vaccines you need and how often you need them. This information is not intended to replace advice given to you by your health care provider. Make sure you discuss any questions you have with your health care provider. Document Released: 07/13/2015 Document Revised: 03/05/2016 Document Reviewed: 04/17/2015 Elsevier Interactive Patient Education  2017 [place] Prevention in the Home Falls can cause injuries. They can happen to people of all ages. There are many things you can do to make your home safe and to help prevent falls. What can I do on the outside of my home?  Regularly fix the edges of walkways and driveways and fix any cracks.  Remove anything that might make you trip as you walk through a door, such as a raised step or threshold.  Trim any bushes or trees on the path to your home.  Use bright outdoor lighting.  Clear any walking paths of anything that might make someone trip, such as rocks or tools.  Regularly check to see if handrails are loose or broken. Make sure that both sides of any steps have handrails.  Any raised decks and porches should have guardrails on the edges.  Have any leaves, snow, or ice cleared regularly.  Use sand or salt on walking paths during winter.  Clean up any spills in your garage right away. This includes oil or grease spills. What can I do in the bathroom?  Use night lights.  Install grab bars by the toilet and in the tub and shower. Do not use towel bars as grab bars.  Use non-skid mats or decals in the tub or shower.  If you need to sit down in the shower, use a plastic, non-slip stool.  Keep the floor dry. Clean up any water that spills on the floor as soon as it happens.  Remove soap  buildup in the tub or shower regularly.  Attach bath mats securely with double-sided non-slip rug tape.  Do not have throw rugs and other things on the floor that can make you trip. What can I do in the bedroom?  Use night lights.  Make sure that you have a light by your bed that is easy to reach.  Do not use any sheets or blankets that are too big for your bed. They should not hang down onto the floor.  Have a firm chair that has side arms. You can use this for support while you get dressed.  Do not have throw rugs and other things on the floor that can make you trip. What can I do in the kitchen?  Clean up any spills right away.  Avoid walking on wet floors.  Keep items that you use a lot in easy-to-reach places.  If you need to reach something above you, use a strong step stool that has a grab bar.  Keep electrical cords out of the way.  Do not use floor polish or wax that makes floors slippery. If you must use wax, use non-skid floor wax.  Do not have throw rugs and other things on the floor that  can make you trip. What can I do with my stairs?  Do not leave any items on the stairs.  Make sure that there are handrails on both sides of the stairs and use them. Fix handrails that are broken or loose. Make sure that handrails are as long as the stairways.  Check any carpeting to make sure that it is firmly attached to the stairs. Fix any carpet that is loose or worn.  Avoid having throw rugs at the top or bottom of the stairs. If you do have throw rugs, attach them to the floor with carpet tape.  Make sure that you have a light switch at the top of the stairs and the bottom of the stairs. If you do not have them, ask someone to add them for you. What else can I do to help prevent falls?  Wear shoes that:  Do not have high heels.  Have rubber bottoms.  Are comfortable and fit you well.  Are closed at the toe. Do not wear sandals.  If you use a stepladder:  Make  sure that it is fully opened. Do not climb a closed stepladder.  Make sure that both sides of the stepladder are locked into place.  Ask someone to hold it for you, if possible.  Clearly mark and make sure that you can see:  Any grab bars or handrails.  First and last steps.  Where the edge of each step is.  Use tools that help you move around (mobility aids) if they are needed. These include:  Canes.  Walkers.  Scooters.  Crutches.  Turn on the lights when you go into a dark area. Replace any light bulbs as soon as they burn out.  Set up your furniture so you have a clear path. Avoid moving your furniture around.  If any of your floors are uneven, fix them.  If there are any pets around you, be aware of where they are.  Review your medicines with your doctor. Some medicines can make you feel dizzy. This can increase your chance of falling. Ask your doctor what other things that you can do to help prevent falls. This information is not intended to replace advice given to you by your health care provider. Make sure you discuss any questions you have with your health care provider. Document Released: 04/12/2009 Document Revised: 11/22/2015 Document Reviewed: 07/21/2014 Elsevier Interactive Patient Education  2017 [employer].

## 2019-02-17 NOTE — Progress Notes (Signed)
Subjective:   Kim Grimes is a 68 y.o. female who presents for Medicare Annual (Subsequent) preventive examination.  Location of Patient: Home Location of Provider: Telehealth Consent was obtain for visit to be over via telehealth. I verified that I am speaking with the correct person using two identifiers.   Review of Systems:    Cardiac Risk Factors include: advanced age (>80men, >35 women)     Objective:     Vitals: BP 110/80   Pulse 63   Resp 12   Ht 4\' 10"  (1.473 m)   Wt 140 lb (63.5 kg)   BMI 29.26 kg/m   Body mass index is 29.26 kg/m.  Advanced Directives 07/01/2018 06/22/2018 02/15/2018 08/01/2016 05/24/2014 01/19/2012 07/28/2011  Does Patient Have a Medical Advance Directive? No No No No No Patient has advance directive, copy not in chart Patient does not have advance directive  Type of Advance Directive - - - - - [place] -  Does patient want to make changes to medical advance directive? - - Yes (ED - Information included in AVS) - - - -  Would patient like information on creating a medical advance directive? No - Patient declined No - Patient declined - No - Patient declined No - patient declined information - -  Pre-existing out of facility DNR order (yellow form or pink MOST form) - - - - - No No    Tobacco Social History   Tobacco Use  Smoking Status Never Smoker  Smokeless Tobacco Never Used  Tobacco Comment   Never smoked     Counseling given: Yes Comment: Never smoked   Clinical Intake:  Pre-visit preparation completed: Yes  Pain : 0-10 Pain Score: 8  Pain Type: Acute pain Pain Location: Foot Pain Descriptors / Indicators: Throbbing Pain Onset: In the past 7 days Pain Frequency: Intermittent Pain Relieving Factors: pain medication and wrapping it up Effect of Pain on Daily Activities: no  Pain Relieving Factors: pain medication and wrapping it up  BMI - recorded: 29.26 Nutritional Status: BMI 25 -29 Overweight  Nutritional Risks: None Diabetes: No  How often do you need to have someone help you when you read instructions, pamphlets, or other written materials from your doctor or pharmacy?: 1 - Never What is the last grade level you completed in school?: 12+ some college  Interpreter Needed?: No     Past Medical History:  Diagnosis Date  . Anxiety disorder   . Carotid artery stenosis   . Chronic pelvic pain in female   . Coronary artery disease   . Endometriosis   . GERD (gastroesophageal reflux disease)   . Helicobacter pylori gastritis    Dr. Olevia Perches   . Hiatal hernia 2003   on UGI   . Hypothyroidism 2009  . IBS (irritable bowel syndrome) 2003  . Obesity (BMI 30-39.9) DEC 2011 145 LBS  . Osteoporosis   . PONV (postoperative nausea and vomiting)    Past Surgical History:  Procedure Laterality Date  . ABDOMINAL HYSTERECTOMY    . adhesiolysis     dense adhesion between rectum and sigmoid,  & pelvis   . BRAVO [place] STUDY  01/19/2012   Procedure: BRAVO [place];  Surgeon: [name], MD;  [facility];  Service: Endoscopy;;  . COLONOSCOPY  03: AP/D & 08: SCREENING   WNL'S  . COLONOSCOPY  07/28/2011   sessile polyp in ascending colon/internal hemorrhoids. Next colonoscopy January 2018  . COLONOSCOPY N/A 08/01/2016  Procedure: COLONOSCOPY;  Surgeon: [name], MD;  [facility];  Service: Endoscopy;  Laterality: N/A;  930   . ESOPHAGOGASTRODUODENOSCOPY  01/19/2012   SLF: SMALL Hiatal hernia/Mild gastritis  . EYE SURGERY Right 10/18/2013   catarat extraction  . LAPAROSCOPY    . OLECRANON BURSECTOMY Left 07/01/2018   Procedure: LEFT ELBOW OLECRANON BURSECTOMY AND SPUR EXCISION;  Surgeon: [name], MD;  Location: [facility];  Service: Orthopedics;  Laterality: Left;  . rt foot sx Bilateral   . s/p hysterectomy    . SALPINGOOPHORECTOMY    . UPPER GASTROINTESTINAL ENDOSCOPY  DEC 2010: DYSPEPSIA/DYSPHAGIA   [place] RING/ESO DIL 16 MM, GASTRITIS/DUODENITIS 2o  to Aleve/on OMP PRN   Family History  Problem Relation Age of Onset  . Prostate cancer Father   . Colon cancer Father        < 45 YO  . Cancer Father        prostate and colon  . Pancreatic cancer Mother   . Cancer Mother        pancreas  . Diabetes Mother   . Diabetes Brother   . Cancer Brother        prostate   . Diabetes Sister        prediabetic  . Heart disease Brother        CHF  . Colon polyps Neg Hx    Social History   Socioeconomic History  . Marital status: Divorced    Spouse name: Not on file  . Number of children: Not on file  . Years of education: Not on file  . Highest education level: Not on file  Occupational History  . Occupation: fulltime at [employer]  . Financial resource strain: Not hard at all  . Food insecurity    Worry: Never true    Inability: Never true  . Transportation needs    Medical: No    Non-medical: No  Tobacco Use  . Smoking status: Never Smoker  . Smokeless tobacco: Never Used  . Tobacco comment: Never smoked  Substance and Sexual Activity  . Alcohol use: Yes    Alcohol/week: 0.0 standard drinks    Comment: occasional, maybe once/year  . Drug use: No  . Sexual activity: Not Currently    Birth control/protection: None  Lifestyle  . Physical activity    Days per week: 4 days    Minutes per session: 60 min  . Stress: Only a little  Relationships  . Social Herbalist on phone: Three times a week    Gets together: Three times a week    Attends religious service: More than 4 times per year    Active member of club or organization: Yes    Attends meetings of clubs or organizations: Never    Relationship status: Divorced  Other Topics Concern  . Not on file  Social History Narrative  . Not on file    Outpatient Encounter Medications as of 02/17/2019  Medication Sig  . Calcium-Magnesium-Vitamin D (CALCIUM MAGNESIUM PO) Take 1 tablet by mouth daily.  . fluticasone (FLONASE) 50  MCG/ACT nasal spray Place 2 sprays into both nostrils daily.  Marland Kitchen levocetirizine (XYZAL) 5 MG tablet Take 1 tablet (5 mg total) by mouth every evening.  Marland Kitchen levothyroxine (SYNTHROID, LEVOTHROID) 112 MCG tablet Take 1 tablet (112 mcg total) by mouth daily.  Marland Kitchen omeprazole (PRILOSEC) 20 MG capsule TAKE 1 CAPSULE (20 MG TOTAL) BY MOUTH  DAILY BEFORE BREAKFAST.   No facility-administered encounter medications on file as of 02/17/2019.     Activities of Daily Living In your present state of health, do you have any difficulty performing the following activities: 02/17/2019 07/01/2018  Hearing? N N  Vision? N N  Difficulty concentrating or making decisions? N N  Walking or climbing stairs? N N  Dressing or bathing? N N  Doing errands, shopping? N -  Preparing Food and eating ? N -  Using the Toilet? N -  In the past six months, have you accidently leaked urine? N -  Do you have problems with loss of bowel control? N -  Managing your Medications? N -  Managing your Finances? N -  Housekeeping or managing your Housekeeping? N -  Some recent data might be hidden    Patient Care Team: Fayrene Helper, MD as PCP - General (Family Medicine) [name], MD (Gastroenterology)    Assessment:   This is a routine wellness examination for [place].  Exercise Activities and Dietary recommendations Current Exercise Habits: Home exercise routine, Type of exercise: walking, Time (Minutes): 60, Frequency (Times/Week): 5, Weekly Exercise (Minutes/Week): 300, Intensity: Moderate, Exercise limited by: None identified  Goals   None     Fall Risk Fall Risk  02/17/2019 12/13/2018 05/24/2018 03/15/2018 02/15/2018  Falls in the past year? 0 0 0 No No  Number falls in past yr: 0 - 0 - -  Injury with Fall? 0 0 0 - -   Is the patient's home free of loose throw rugs in walkways, pet beds, electrical cords, etc?   yes      Grab bars in the bathroom? no      Handrails on the stairs?   yes      Adequate lighting?    yes     Depression Screen PHQ 2/9 Scores 02/17/2019 12/13/2018 05/24/2018 03/15/2018  PHQ - 2 Score 0 1 0 0  PHQ- 9 Score - - - 2     Cognitive Function     6CIT Screen 02/17/2019 02/15/2018  What Year? 0 points 0 points  What month? 0 points 0 points  What time? 0 points 0 points  Count back from 20 0 points 0 points  Months in reverse 0 points 2 points  Repeat phrase 0 points 2 points  Total Score 0 4    Immunization History  Administered Date(s) Administered  . Influenza,inj,Quad PF,6+ Mos 04/13/2013, 05/09/2014, 03/23/2015, 02/16/2018  . Pneumococcal Conjugate-13 03/15/2018  . Tdap 09/18/2011  . Zoster 09/18/2011    Qualifies for Shingles Vaccine? completed  Screening Tests Health Maintenance  Topic Date Due  . Hepatitis C Screening  03-07-1951  . INFLUENZA VACCINE  01/29/2019  . PNA vac Low Risk Adult (2 of 2 - PPSV23) 03/16/2019  . MAMMOGRAM  12/28/2020  . TETANUS/TDAP  09/17/2021  . COLONOSCOPY  08/01/2026  . DEXA SCAN  Completed    Cancer Screenings: Lung: Low Dose CT Chest recommended if Age 36-80 years, 30 pack-year currently smoking OR have quit w/in 15years. Patient does not qualify. Breast:  Up to date on Mammogram? Yes   Up to date of Bone Density/Dexa? Yes Colorectal:  Due 2028  Additional Screenings:   Hepatitis C Screening:  Can be added to next labs     Plan:      1. Encounter for Medicare annual wellness exam   I have personally reviewed and noted the following in the patient's chart:   .  Medical and social history . Use of alcohol, tobacco or illicit drugs  . Current medications and supplements . Functional ability and status . Nutritional status . Physical activity . Advanced directives . List of other physicians . Hospitalizations, surgeries, and ER visits in previous 12 months . Vitals . Screenings to include cognitive, depression, and falls . Referrals and appointments  In addition, I have reviewed and discussed with  patient certain preventive protocols, quality metrics, and best practice recommendations. A written personalized care plan for preventive services as well as general preventive health recommendations were provided to patient.     I provided 20 minutes of non-face-to-face time during this encounter.   Perlie Mayo, NP  02/17/2019

## 2019-02-28 ENCOUNTER — Ambulatory Visit (INDEPENDENT_AMBULATORY_CARE_PROVIDER_SITE_OTHER): Payer: PPO

## 2019-02-28 ENCOUNTER — Other Ambulatory Visit: Payer: Self-pay

## 2019-02-28 DIAGNOSIS — Z23 Encounter for immunization: Secondary | ICD-10-CM | POA: Diagnosis not present

## 2019-03-21 DIAGNOSIS — H25812 Combined forms of age-related cataract, left eye: Secondary | ICD-10-CM | POA: Diagnosis not present

## 2019-03-21 DIAGNOSIS — H40013 Open angle with borderline findings, low risk, bilateral: Secondary | ICD-10-CM | POA: Diagnosis not present

## 2019-03-21 DIAGNOSIS — Z961 Presence of intraocular lens: Secondary | ICD-10-CM | POA: Diagnosis not present

## 2019-03-21 DIAGNOSIS — H43813 Vitreous degeneration, bilateral: Secondary | ICD-10-CM | POA: Diagnosis not present

## 2019-03-21 DIAGNOSIS — H26491 Other secondary cataract, right eye: Secondary | ICD-10-CM | POA: Diagnosis not present

## 2019-03-21 DIAGNOSIS — H3540 Unspecified peripheral retinal degeneration: Secondary | ICD-10-CM | POA: Diagnosis not present

## 2019-03-21 DIAGNOSIS — H2512 Age-related nuclear cataract, left eye: Secondary | ICD-10-CM | POA: Diagnosis not present

## 2019-03-21 DIAGNOSIS — H1045 Other chronic allergic conjunctivitis: Secondary | ICD-10-CM | POA: Diagnosis not present

## 2019-03-21 DIAGNOSIS — H524 Presbyopia: Secondary | ICD-10-CM | POA: Diagnosis not present

## 2019-03-21 DIAGNOSIS — H04123 Dry eye syndrome of bilateral lacrimal glands: Secondary | ICD-10-CM | POA: Diagnosis not present

## 2019-03-21 DIAGNOSIS — H16223 Keratoconjunctivitis sicca, not specified as Sjogren's, bilateral: Secondary | ICD-10-CM | POA: Diagnosis not present

## 2019-03-21 DIAGNOSIS — H25012 Cortical age-related cataract, left eye: Secondary | ICD-10-CM | POA: Diagnosis not present

## 2019-03-22 ENCOUNTER — Ambulatory Visit (INDEPENDENT_AMBULATORY_CARE_PROVIDER_SITE_OTHER): Payer: PPO | Admitting: Family Medicine

## 2019-03-22 ENCOUNTER — Encounter: Payer: Self-pay | Admitting: Family Medicine

## 2019-03-22 ENCOUNTER — Other Ambulatory Visit: Payer: Self-pay

## 2019-03-22 VITALS — BP 112/80 | HR 71 | Temp 97.5°F | Resp 15 | Ht <= 58 in | Wt 141.0 lb

## 2019-03-22 DIAGNOSIS — E039 Hypothyroidism, unspecified: Secondary | ICD-10-CM

## 2019-03-22 DIAGNOSIS — H6501 Acute serous otitis media, right ear: Secondary | ICD-10-CM | POA: Diagnosis not present

## 2019-03-22 DIAGNOSIS — K219 Gastro-esophageal reflux disease without esophagitis: Secondary | ICD-10-CM

## 2019-03-22 DIAGNOSIS — R7302 Impaired glucose tolerance (oral): Secondary | ICD-10-CM

## 2019-03-22 DIAGNOSIS — Z23 Encounter for immunization: Secondary | ICD-10-CM | POA: Diagnosis not present

## 2019-03-22 DIAGNOSIS — H6691 Otitis media, unspecified, right ear: Secondary | ICD-10-CM | POA: Insufficient documentation

## 2019-03-22 MED ORDER — SULFAMETHOXAZOLE-TRIMETHOPRIM 400-80 MG PO TABS: 1.0000 | ORAL_TABLET | Freq: Two times a day (BID) | ORAL | 0 refills | Status: DC

## 2019-03-22 NOTE — Patient Instructions (Signed)
Keep appointment as before, call if you need me sooner  You are treated for right ear infection, call in 2 to 3 weeks if not better, I will refer you to eNT   Pneumonia 23 vaccine today

## 2019-03-22 NOTE — Progress Notes (Signed)
   Kim Grimes     MRN: [id number]      DOB: [birth year]   HPI Kim Grimes is here with a 2 month h/o right ear discomfort, fullness , and burning, feels as though she has wax buld up even tastes wax in her mouth Has no fever, chills, excess sinus drainage or pressure. Denies cough and sore throat ROS  Denies chest pains, palpitations and leg swelling Denies abdominal pain, nausea, vomiting,diarrhea or constipation.   Denies dysuria, frequency, hesitancy or incontinence. Denies joint pain, swelling and limitation in mobility. Denies headaches, seizures, numbness, or tingling. Denies depression, anxiety or insomnia. Denies skin break down or rash.   PE  BP 112/80   Pulse 71   Temp (!) 97.5 F (36.4 C) (Temporal)   Resp 15   Ht 4\' 10"  (1.473 m)   Wt 141 lb (64 kg)   SpO2 99%   BMI 29.47 kg/m   Patient alert and oriented and in no cardiopulmonary distress.  HEENT: No facial asymmetry, EOMI,   oropharynx pink and moist.  Neck supple no JVD, no mass.left tM clear, good light reflex, right tM erythematoous  Chest: Clear to auscultation bilaterally.  CVS: S1, S2 no murmurs, no S3.Regular rate.  ABD: Soft non tender.   Ext: No edema  MS: Adequate ROM spine, shoulders, hips and knees.  Skin: Intact, no ulcerations or rash noted.  Psych: Good eye contact, normal affect. Memory intact not anxious or depressed appearing.  CNS: CN 2-12 intact, power,  normal throughout.no focal deficits noted.   Assessment & Plan  Right otitis media Antibiotic course prescribed  GERD (gastroesophageal reflux disease) Controlled, no change in medication Managed by GI  Hypothyroidism Controlled, no change in medication Managed by Endo  IGT (impaired glucose tolerance) Updated lab needed at/ before next visit.

## 2019-03-26 ENCOUNTER — Encounter: Payer: Self-pay | Admitting: Family Medicine

## 2019-03-27 NOTE — Assessment & Plan Note (Signed)
Updated lab needed at/ before next visit.   

## 2019-03-27 NOTE — Assessment & Plan Note (Signed)
Antibiotic course prescribed 

## 2019-03-27 NOTE — Assessment & Plan Note (Signed)
Controlled, no change in medication  Managed by Endo 

## 2019-03-27 NOTE — Assessment & Plan Note (Signed)
Controlled, no change in medication Managed by GI 

## 2019-04-04 ENCOUNTER — Other Ambulatory Visit: Payer: Self-pay | Admitting: Family Medicine

## 2019-04-04 ENCOUNTER — Telehealth: Payer: Self-pay | Admitting: *Deleted

## 2019-04-04 MED ORDER — CIPROFLOXACIN-FLUOCINOLONE PF 0.3-0.025 % OT SOLN: OTIC | 0 refills | Status: DC

## 2019-04-04 NOTE — Telephone Encounter (Signed)
Patient aware.

## 2019-04-04 NOTE — Telephone Encounter (Signed)
Med is prescribed , please let her know

## 2019-04-04 NOTE — Telephone Encounter (Signed)
Pt called said she was saw for her ear last week however the medicine that she called in has made her sick to her stomach. She would like some drops called in if possible because the med that was called in has not helped at all.

## 2019-04-13 ENCOUNTER — Encounter: Payer: Self-pay | Admitting: Family Medicine

## 2019-04-13 ENCOUNTER — Ambulatory Visit (INDEPENDENT_AMBULATORY_CARE_PROVIDER_SITE_OTHER): Payer: PPO | Admitting: Family Medicine

## 2019-04-13 ENCOUNTER — Other Ambulatory Visit: Payer: Self-pay

## 2019-04-13 VITALS — BP 110/74 | HR 67 | Temp 97.7°F | Resp 15 | Ht 58.5 in | Wt 144.0 lb

## 2019-04-13 DIAGNOSIS — H6501 Acute serous otitis media, right ear: Secondary | ICD-10-CM

## 2019-04-13 MED ORDER — AMOXICILLIN-POT CLAVULANATE 875-125 MG PO TABS: 1.0000 | ORAL_TABLET | Freq: Two times a day (BID) | ORAL | 0 refills | Status: AC

## 2019-04-13 NOTE — Patient Instructions (Addendum)
    I appreciate the opportunity to provide you with the care for your health and wellness. Today we discussed: ear ache/infection  Follow up: 2 weeks  No labs or referrals today  I have called in a antibiotic for you to take for 7 days. It is well tolerated and usually does not cause upset stomach.  Please take it even if stomach is upset, as it will subside once you stop medication,  but you need the medication for the ear infection. You can take pepto or probiotic to help with stomach discomfort.   Pick up sweet oil for ears and take as directed on the box. You can ask the pharmacist about this as well. It has been known to help with pain.  You can also take tylenol or ibuprofen as directed on the bottles to help with pain.  Warm compress over the ear is soothing also.  If these do not help or clear the ear we should consider referring you to the ENT for checking your ear and throat and making sure your eustachian tube is okay.   Please continue to practice social distancing to keep you, your family, and our community safe.  If you must go out, please wear a mask and practice good handwashing.  It was a pleasure to see you and I look forward to continuing to work together on your health and well-being. Please do not hesitate to call the office if you need care or have questions about your care.  Have a wonderful day and week. With Gratitude, Cherly Beach, DNP, AGNP-BC

## 2019-04-13 NOTE — Progress Notes (Signed)
Subjective:     Patient ID: Kim Grimes, female   DOB: 1951-04-12, 68 y.o.   MRN: [id number]  Kim Grimes presents for Ear Pain (right ear. has been going on for a while. feels like something is in it. feels like she is tasting was in her mouth. hurts worse in the morning.)  Kim Grimes is a 68 year old female patient who has been seen recently for otitis media.  Was placed on Bactrim and eardrops but was unable to afford the eardrops and reported that the Bactrim made her stomach hurt so she stopped taking it.  She reports that she continues to have the discomfort in her ear.  And feels like she has a waxy taste in her mouth as well as having more pain in the morning.  Today patient denies signs and symptoms of COVID 19 infection including fever, chills, cough, shortness of breath, and headache.  Past Medical, Surgical, Social History, Allergies, and Medications have been Reviewed.   Past Medical History:  Diagnosis Date  . Anxiety disorder   . Carotid artery stenosis   . Chronic pelvic pain in female   . Coronary artery disease   . Endometriosis   . GERD (gastroesophageal reflux disease)   . Helicobacter pylori gastritis    Dr. Olevia Perches   . Hiatal hernia 2003   on UGI   . Hypothyroidism 2009  . IBS (irritable bowel syndrome) 2003  . Obesity (BMI 30-39.9) DEC 2011 145 LBS  . Osteoporosis   . PONV (postoperative nausea and vomiting)    Past Surgical History:  Procedure Laterality Date  . ABDOMINAL HYSTERECTOMY    . adhesiolysis     dense adhesion between rectum and sigmoid,  & pelvis   . BRAVO [place] STUDY  01/19/2012   Procedure: BRAVO [place];  Surgeon: [name], MD;  [facility];  Service: Endoscopy;;  . COLONOSCOPY  03: [facility]/D & 08: SCREENING   WNL'S  . COLONOSCOPY  07/28/2011   sessile polyp in ascending colon/internal hemorrhoids. Next colonoscopy January 2018  . COLONOSCOPY N/A 08/01/2016   Procedure: COLONOSCOPY;  Surgeon: [name], MD;  [facility];  Service: Endoscopy;  Laterality: N/A;  930   . ESOPHAGOGASTRODUODENOSCOPY  01/19/2012   SLF: SMALL Hiatal hernia/Mild gastritis  . EYE SURGERY Right 10/18/2013   catarat extraction  . LAPAROSCOPY    . OLECRANON BURSECTOMY Left 07/01/2018   Procedure: LEFT ELBOW OLECRANON BURSECTOMY AND SPUR EXCISION;  Surgeon: [name], MD;  Location[facility];  Service: Orthopedics;  Laterality: Left;  . rt foot sx Bilateral   . s/p hysterectomy    . SALPINGOOPHORECTOMY    . UPPER GASTROINTESTINAL ENDOSCOPY  DEC 2010: DYSPEPSIA/DYSPHAGIA   [place] RING/ESO DIL 16 MM, GASTRITIS/DUODENITIS 2o to Aleve/on OMP PRN   Social History   Socioeconomic History  . Marital status: Divorced    Spouse name: Not on file  . Number of children: Not on file  . Years of education: Not on file  . Highest education level: Not on file  Occupational History  . Occupation: fulltime at [employer]  . Financial resource strain: Not hard at all  . Food insecurity    Worry: Never true    Inability: Never true  . Transportation needs    Medical: No    Non-medical: No  Tobacco Use  . Smoking status: Never Smoker  . Smokeless tobacco: Never Used  .  Tobacco comment: Never smoked  Substance and Sexual Activity  . Alcohol use: Yes    Alcohol/week: 0.0 standard drinks    Comment: occasional, maybe once/year  . Drug use: No  . Sexual activity: Not Currently    Birth control/protection: None  Lifestyle  . Physical activity    Days per week: 4 days    Minutes per session: 60 min  . Stress: Only a little  Relationships  . Social Herbalist on phone: Three times a week    Gets together: Three times a week    Attends religious service: More than 4 times per year    Active member of club or organization: Yes    Attends meetings of clubs or organizations: Never    Relationship status: Divorced  . Intimate partner violence    Fear of current  or ex partner: No    Emotionally abused: No    Physically abused: No    Forced sexual activity: No  Other Topics Concern  . Not on file  Social History Narrative  . Not on file    Outpatient Encounter Medications as of 04/13/2019  Medication Sig  . Calcium-Magnesium-Vitamin D (CALCIUM MAGNESIUM PO) Take 1 tablet by mouth daily.  . fluticasone (FLONASE) 50 MCG/ACT nasal spray Place 2 sprays into both nostrils daily.  Marland Kitchen levocetirizine (XYZAL) 5 MG tablet Take 1 tablet (5 mg total) by mouth every evening.  Marland Kitchen levothyroxine (SYNTHROID, LEVOTHROID) 112 MCG tablet Take 1 tablet (112 mcg total) by mouth daily.  Marland Kitchen omeprazole (PRILOSEC) 20 MG capsule TAKE 1 CAPSULE (20 MG TOTAL) BY MOUTH DAILY BEFORE BREAKFAST.  . [DISCONTINUED] ciprofloxacin-fluocinolone PF (OTOVEL) 0.3-0.025 % SOLN Apply 2 drops to affected ear (s) three times daily for 1 week only (Patient not taking: Reported on 04/13/2019)  . [DISCONTINUED] sulfamethoxazole-trimethoprim (BACTRIM) 400-80 MG tablet Take 1 tablet by mouth 2 (two) times daily. (Patient not taking: Reported on 04/13/2019)   No facility-administered encounter medications on file as of 04/13/2019.    Allergies  Allergen Reactions  . Aspirin Other (See Comments)    Intolerant of blood thinners  . Diazepam Other (See Comments)    Aggitation    Review of Systems  Constitutional: Negative for chills and fever.  HENT: Positive for ear discharge and ear pain.   Eyes: Negative.   Respiratory: Negative.   Cardiovascular: Negative.   Gastrointestinal: Negative.   Endocrine: Negative.   Genitourinary: Negative.   Musculoskeletal: Negative.   Skin: Negative.   Allergic/Immunologic: Negative.   Neurological: Negative.   Hematological: Negative.   Psychiatric/Behavioral: Negative.   All other systems reviewed and are negative.      Objective:     BP 110/74   Pulse 67   Temp 97.7 F (36.5 C) (Oral)   Resp 15   Ht 4' 10.5" (1.486 m)   Wt 144 lb (65.3  kg)   SpO2 98%   BMI 29.58 kg/m   Physical Exam Vitals signs and nursing note reviewed.  Constitutional:      Appearance: Normal appearance. She is well-developed and well-groomed. She is obese.  HENT:     Head: Normocephalic and atraumatic.     Right Ear: Hearing and external ear normal. Tenderness present. There is no impacted cerumen. Tympanic membrane is erythematous.     Left Ear: Hearing and external ear normal. There is no impacted cerumen. Tympanic membrane is injected and erythematous.     Ears:     Comments: Cloudy-coloring more of  possible mid ear infection, mild noted air bubbles    Nose: Nose normal.     Mouth/Throat:     Mouth: Mucous membranes are moist.     Pharynx: Oropharynx is clear.  Eyes:     General: Lids are normal.        Right eye: No discharge.        Left eye: No discharge.     Conjunctiva/sclera: Conjunctivae normal.  Neck:     Musculoskeletal: Normal range of motion and neck supple.  Cardiovascular:     Rate and Rhythm: Normal rate and regular rhythm.     Pulses: Normal pulses.     Heart sounds: Normal heart sounds.  Pulmonary:     Effort: Pulmonary effort is normal.     Breath sounds: Normal breath sounds.  Musculoskeletal: Normal range of motion.  Lymphadenopathy:     Cervical: No cervical adenopathy.  Skin:    General: Skin is warm.  Neurological:     General: No focal deficit present.     Mental Status: She is alert and oriented to person, place, and time.  Psychiatric:        Attention and Perception: Attention normal.        Mood and Affect: Mood normal.        Speech: Speech normal.        Behavior: Behavior normal. Behavior is cooperative.        Thought Content: Thought content normal.        Cognition and Memory: Cognition normal.        Judgment: Judgment normal.        Assessment and Plan         1. Non-recurrent acute serous otitis media of right ear She has otitis media of the right ear.  Appears to have some mild  injection of the left ear starting.  But the right ear is what gives her the most trouble.  She did not complete her antibiotic the first time around.  I provided her with Augmentin for otitis media.  Additionally will avoid eardrops as they are rather expensive for her.  Advised that she could get some sweet oil to help with some discomfort and use of warm compresses.  If these did not help over the next week that she is to possibly get a ENT referral as she might have eustachian tube dysfunction which is causing it to become blocked and clogged and preventing the proper drainage which is what hurts the most in the morning after she is laid down for a while.  - amoxicillin-clavulanate (AUGMENTIN) 875-125 MG tablet; Take 1 tablet by mouth 2 (two) times daily for 7 days.  Dispense: 14 tablet; Refill: 0   Follow Up: 2 weeks to recheck and refer if needed  Kim Mayo, DNP, AGNP-BC [place], [place], [place] [address] Office Hours: Mon-Thurs 8 am-5 pm; Fri 8 am-12 pm Office Phone:  [phone]  Office Fax: [phone]

## 2019-04-27 ENCOUNTER — Other Ambulatory Visit: Payer: Self-pay

## 2019-04-27 ENCOUNTER — Encounter: Payer: Self-pay | Admitting: Family Medicine

## 2019-04-27 ENCOUNTER — Ambulatory Visit (INDEPENDENT_AMBULATORY_CARE_PROVIDER_SITE_OTHER): Payer: PPO | Admitting: Family Medicine

## 2019-04-27 VITALS — BP 126/74 | HR 75 | Temp 97.5°F | Resp 15 | Ht <= 58 in | Wt 143.1 lb

## 2019-04-27 DIAGNOSIS — H6981 Other specified disorders of Eustachian tube, right ear: Secondary | ICD-10-CM

## 2019-04-27 DIAGNOSIS — H9211 Otorrhea, right ear: Secondary | ICD-10-CM | POA: Diagnosis not present

## 2019-04-27 NOTE — Patient Instructions (Addendum)
  I appreciate the opportunity to provide you with the care for your health and wellness. Today we discussed: ear trouble  Follow up: 05/30/2019  No labs   Referral to ENT for possible eustachian tube dysfunction.  AVOID putting anything in your ear. It is inflamed at the moment.  Please continue to practice social distancing to keep you, your family, and our community safe.  If you must go out, please wear a mask and practice good handwashing.  It was a pleasure to see you and I look forward to continuing to work together on your health and well-being. Please do not hesitate to call the office if you need care or have questions about your care.  Have a wonderful day and week. With Gratitude, Cherly Beach, DNP, AGNP-BC

## 2019-04-27 NOTE — Progress Notes (Signed)
Subjective:     Patient ID: Kim Grimes, female   DOB: 1950-11-27, 68 y.o.   MRN: [id number]  [name] presents for Ear Pain (2 week follow up. only feels a little better. feels like there is something in it. sometimes feels like something is running out of it but nothing comes out.)  Ms. Kaschak is back today with ear discomfort and drainage.  Here feels like there is something in there crawling around but nothing comes out.  She reports taking a hairpin into the ear.  Trying to get whenever it is out.  I treated her for otitis media of the right ear 2 weeks back.  Reports that have some improvement during the antibiotic course even though it gave her diarrhea she continued it and then she stopped it.  She did get sweet oil but she got all of oil to use to help with some discomfort.  I discussed with her at that appointment that we might need to do an ENT referral as she might have eustachian tube dysfunction which can cause blockage and clogging and prevent proper drainage and she reported that she was ready to have that referral and did not want to try anything else at this time.  Today patient denies signs and symptoms of COVID 19 infection including fever, chills, cough, shortness of breath, and headache.  Past Medical, Surgical, Social History, Allergies, and Medications have been Reviewed.  Past Medical History:  Diagnosis Date  . Anxiety disorder   . Carotid artery stenosis   . Chronic pelvic pain in female   . Coronary artery disease   . Endometriosis   . GERD (gastroesophageal reflux disease)   . Helicobacter pylori gastritis    Dr. Olevia Perches   . Hiatal hernia 2003   on UGI   . Hypothyroidism 2009  . IBS (irritable bowel syndrome) 2003  . Obesity (BMI 30-39.9) DEC 2011 145 LBS  . Osteoporosis   . PONV (postoperative nausea and vomiting)    Past Surgical History:  Procedure Laterality Date  . ABDOMINAL HYSTERECTOMY    . adhesiolysis     dense adhesion  between rectum and sigmoid,  & pelvis   . BRAVO [place] STUDY  01/19/2012   Procedure: BRAVO [place];  Surgeon: [name], MD;  Location: [facility];  Service: Endoscopy;;  . COLONOSCOPY  03: [facility]/D & 08: SCREENING   WNL'S  . COLONOSCOPY  07/28/2011   sessile polyp in ascending colon/internal hemorrhoids. Next colonoscopy January 2018  . COLONOSCOPY N/A 08/01/2016   Procedure: COLONOSCOPY;  Surgeon: [name], MD;  Location: [facility];  Service: Endoscopy;  Laterality: N/A;  930   . ESOPHAGOGASTRODUODENOSCOPY  01/19/2012   SLF: SMALL Hiatal hernia/Mild gastritis  . EYE SURGERY Right 10/18/2013   catarat extraction  . LAPAROSCOPY    . OLECRANON BURSECTOMY Left 07/01/2018   Procedure: LEFT ELBOW OLECRANON BURSECTOMY AND SPUR EXCISION;  Surgeon: [name], MD;  Location: [facility];  Service: Orthopedics;  Laterality: Left;  . rt foot sx Bilateral   . s/p hysterectomy    . SALPINGOOPHORECTOMY    . UPPER GASTROINTESTINAL ENDOSCOPY  DEC 2010: DYSPEPSIA/DYSPHAGIA   [place] RING/ESO DIL 16 MM, GASTRITIS/DUODENITIS 2o to [place]/on OMP PRN   Social History   Socioeconomic History  . Marital status: Divorced    Spouse name: Not on file  . Number of children: Not on file  . Years of education: Not on file  .  Highest education level: Not on file  Occupational History  . Occupation: fulltime at [employer]  . Financial resource strain: Not hard at all  . Food insecurity    Worry: Never true    Inability: Never true  . Transportation needs    Medical: No    Non-medical: No  Tobacco Use  . Smoking status: Never Smoker  . Smokeless tobacco: Never Used  . Tobacco comment: Never smoked  Substance and Sexual Activity  . Alcohol use: Yes    Alcohol/week: 0.0 standard drinks    Comment: occasional, maybe once/year  . Drug use: No  . Sexual activity: Not Currently    Birth control/protection: None  Lifestyle  . Physical activity    Days per week: 4 days     Minutes per session: 60 min  . Stress: Only a little  Relationships  . Social Herbalist on phone: Three times a week    Gets together: Three times a week    Attends religious service: More than 4 times per year    Active member of club or organization: Yes    Attends meetings of clubs or organizations: Never    Relationship status: Divorced  . Intimate partner violence    Fear of current or ex partner: No    Emotionally abused: No    Physically abused: No    Forced sexual activity: No  Other Topics Concern  . Not on file  Social History Narrative  . Not on file    Outpatient Encounter Medications as of 04/27/2019  Medication Sig  . Calcium-Magnesium-Vitamin D (CALCIUM MAGNESIUM PO) Take 1 tablet by mouth daily.  . fluticasone (FLONASE) 50 MCG/ACT nasal spray Place 2 sprays into both nostrils daily.  Marland Kitchen levocetirizine (XYZAL) 5 MG tablet Take 1 tablet (5 mg total) by mouth every evening.  Marland Kitchen levothyroxine (SYNTHROID, LEVOTHROID) 112 MCG tablet Take 1 tablet (112 mcg total) by mouth daily.  Marland Kitchen omeprazole (PRILOSEC) 20 MG capsule TAKE 1 CAPSULE (20 MG TOTAL) BY MOUTH DAILY BEFORE BREAKFAST.   No facility-administered encounter medications on file as of 04/27/2019.    Allergies  Allergen Reactions  . Aspirin Other (See Comments)    Intolerant of blood thinners  . Diazepam Other (See Comments)    Aggitation    Review of Systems  Constitutional: Negative for chills and fever.  HENT: Positive for ear discharge and ear pain.   Eyes: Negative.   Respiratory: Negative.  Negative for cough.   Cardiovascular: Negative.   Gastrointestinal: Negative.   Endocrine: Negative.   Genitourinary: Negative.   Musculoskeletal: Negative.   Skin: Negative.   Allergic/Immunologic: Negative.   Neurological: Negative.   Hematological: Negative.   Psychiatric/Behavioral: Negative.   All other systems reviewed and are negative.      Objective:     BP 126/74   Pulse 75    Temp (!) 97.5 F (36.4 C) (Temporal)   Resp 15   Ht 4\' 10"  (1.473 m)   Wt 143 lb 1.3 oz (64.9 kg)   SpO2 99%   BMI 29.90 kg/m   Physical Exam Vitals signs and nursing note reviewed.  Constitutional:      Appearance: Normal appearance. She is well-developed and well-groomed. She is obese.  HENT:     Head: Normocephalic and atraumatic.     Right Ear: Hearing and external ear normal. Tenderness present. Tympanic membrane is injected. Tympanic membrane is not perforated, erythematous, retracted or  bulging.     Left Ear: Hearing, tympanic membrane, ear canal and external ear normal.     Ears:     Comments: Noted right: Erythematous canal.  Scant amount of cerumen noted     Nose: Nose normal.     Mouth/Throat:     Mouth: Mucous membranes are moist.     Pharynx: Oropharynx is clear.  Eyes:     General:        Right eye: No discharge.        Left eye: No discharge.     Conjunctiva/sclera: Conjunctivae normal.  Neck:     Musculoskeletal: Normal range of motion and neck supple.  Cardiovascular:     Rate and Rhythm: Normal rate and regular rhythm.     Pulses: Normal pulses.     Heart sounds: Normal heart sounds.  Pulmonary:     Effort: Pulmonary effort is normal.     Breath sounds: Normal breath sounds.  Musculoskeletal: Normal range of motion.  Skin:    General: Skin is warm.  Neurological:     General: No focal deficit present.     Mental Status: She is alert and oriented to person, place, and time.  Psychiatric:        Attention and Perception: Attention normal.        Mood and Affect: Mood normal.        Speech: Speech normal.        Behavior: Behavior normal. Behavior is cooperative.        Thought Content: Thought content normal.        Cognition and Memory: Cognition normal.        Judgment: Judgment normal.        Assessment and Plan        1. Dysfunction of right eustachian tube Possible dysfunction of right eustachian tube.  Referral to ENT to clarify.  Did  not want to try any decongestions at this time.  Reports that she is not using her Flonase as directed.  But will start using it today.  Appreciate collaboration in her care.  Please let PCP know if there is anything that I can do them I am. - Ambulatory referral to ENT  2. Ear drainage right She reports right ear drainage do not see any drainage today.  Some mild cerumen noted.  And redness of the canal secondary to her placing a hairpin into it to try to get stuff out.  Advised for her not to do this as she could damage and cause more inflammation as well as her the tympanic membrane.  Referral to ENT placed  - Ambulatory referral to ENT   Follow-up: 05/30/2019  Kim Mayo, DNP, AGNP-BC [address], [address], [place] [address] Office Hours: Mon-Thurs 8 am-5 pm; Fri 8 am-12 pm Office Phone:  [phone]  Office Fax: [phone]

## 2019-05-12 DIAGNOSIS — H40023 Open angle with borderline findings, high risk, bilateral: Secondary | ICD-10-CM | POA: Diagnosis not present

## 2019-05-12 DIAGNOSIS — H26491 Other secondary cataract, right eye: Secondary | ICD-10-CM | POA: Diagnosis not present

## 2019-05-12 DIAGNOSIS — H25812 Combined forms of age-related cataract, left eye: Secondary | ICD-10-CM | POA: Diagnosis not present

## 2019-05-16 ENCOUNTER — Ambulatory Visit (INDEPENDENT_AMBULATORY_CARE_PROVIDER_SITE_OTHER): Payer: PPO | Admitting: Otolaryngology

## 2019-05-16 ENCOUNTER — Other Ambulatory Visit: Payer: Self-pay

## 2019-05-23 NOTE — H&P (Signed)
Surgical History & Physical  Patient Name: Kim Grimes DOB: [birth year]  Surgery: Cataract extraction with intraocular lens implant phacoemulsification; Left Eye  Surgeon: [name] MD Surgery Date:  06/03/2019 Pre-Op Date:  05/12/2019  HPI: [name] patient referred by Dr. Rosana Grimes for cataract OS 1. The patient complains of difficulty when reading fine labels(e.g. medication), which began 1 years ago. The left eye is affected. The episode is constant and gradual. The patient describes foggy and hazy symptoms affecting their eyes/vision. The condition's severity decreased since last visit. Symptoms occur when the patient is driving and reading. Patient has significant difference between OU. OS blurred distance and near c/w OD. OS not able to work as well with OD.  Glare at night significant OS-reduced driving at night due to reduced [place]. Patient does notice floaters occasionally OU.  Medical History: Cataracts Glaucoma Thyroid Problems acid reflux  Review of Systems Negative Allergic/Immunologic Negative Cardiovascular Negative Constitutional Negative Ear, Nose, Mouth & Throat Negative Endocrine Negative Eyes Negative Gastrointestinal Negative Genitourinary Negative Hemotologic/Lymphatic Negative Integumentary Negative Musculoskeletal Negative Neurological Negative Psychiatry Negative Respiratory  Social   Never smoked  Medication Omeprazole, Synthroid,   Sx/Procedures Phaco c IOL OD,  Hysterectomy Elbow sx,   Drug Allergies  Steroid, VALIUM,   History & Physical: Heent:  Cataract, Left eye NECK: supple without bruits LUNGS: lungs clear to auscultation CV: regular rate and rhythm Abdomen: soft and non-tender  Impression & Plan: Assessment: 1.  COMBINED FORMS AGE RELATED CATARACT; Left Eye (H25.812) 2.  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  OAG BORDERLINE FINDINGS HIGH RISK; Both Eyes (H40.023)  Plan: 1.  Cataract accounts for the patient's  decreased vision. This visual impairment is not correctable with a tolerable change in glasses or contact lenses. Cataract surgery with an implantation of a new lens should significantly improve the visual and functional status of the patient. Discussed all risks, benefits, alternatives, and potential complications. Discussed the procedures and recovery. Patient desires to have surgery. A-scan ordered and performed today for intra-ocular lens calculations. The surgery will be performed in order to improve vision for driving, reading, and for eye examinations. Recommend phacoemulsification with intra-ocular lens. Left Eye. Surgery required to correct imbalance of vision. Dilates well - shugarcaine by protocol. 2.  Asymptomatic. Findings, prognosis and treatment options reviewed. No indication for laser at this point, will observe for changes. 3.  Based on cup-to-disc ratio. Positive family history. IOPs are excellent - low normal. OCT rFNL today shows: Borderline findings but symmetric. Will follow with [facility] after cataract surgery. Detailed discussion about glaucoma today including importance of maintaining good follow up and following treatment plan, and the possibility of irreversible blindness as part of this disease process.

## 2019-05-30 ENCOUNTER — Ambulatory Visit (INDEPENDENT_AMBULATORY_CARE_PROVIDER_SITE_OTHER): Payer: PPO | Admitting: Family Medicine

## 2019-05-30 ENCOUNTER — Other Ambulatory Visit: Payer: Self-pay

## 2019-05-30 ENCOUNTER — Encounter: Payer: Self-pay | Admitting: Family Medicine

## 2019-05-30 VITALS — BP 126/74 | Ht <= 58 in | Wt 143.0 lb

## 2019-05-30 DIAGNOSIS — Z1159 Encounter for screening for other viral diseases: Secondary | ICD-10-CM

## 2019-05-30 DIAGNOSIS — R7989 Other specified abnormal findings of blood chemistry: Secondary | ICD-10-CM | POA: Diagnosis not present

## 2019-05-30 DIAGNOSIS — E039 Hypothyroidism, unspecified: Secondary | ICD-10-CM | POA: Diagnosis not present

## 2019-05-30 DIAGNOSIS — J3089 Other allergic rhinitis: Secondary | ICD-10-CM | POA: Diagnosis not present

## 2019-05-30 DIAGNOSIS — H25812 Combined forms of age-related cataract, left eye: Secondary | ICD-10-CM | POA: Diagnosis not present

## 2019-05-30 DIAGNOSIS — K219 Gastro-esophageal reflux disease without esophagitis: Secondary | ICD-10-CM | POA: Diagnosis not present

## 2019-05-30 DIAGNOSIS — E559 Vitamin D deficiency, unspecified: Secondary | ICD-10-CM

## 2019-05-30 MED ORDER — OMEPRAZOLE 20 MG PO CPDR: 20.0000 mg | DELAYED_RELEASE_CAPSULE | Freq: Every day | ORAL | 1 refills | Status: DC

## 2019-05-30 NOTE — Progress Notes (Signed)
Virtual Visit via Telephone Note  I connected with Kim Grimes on 05/30/19 at  8:40 AM EST by telephone and verified that I am speaking with the correct person using two identifiers.  Location: Patient: home Provider: office   I discussed the limitations, risks, security and privacy concerns of performing an evaluation and management service by telephone and the availability of in person appointments. I also discussed with the patient that there may be a patient responsible charge related to this service. The patient expressed understanding and agreed to proceed.   History of Present Illness: Has left cataract surgery in 4 days, has had right eye done approximately  5 years ago and this was successful, feels well overall , no concerns Getting relief of ear pain with daily use of flonase, has had ENT eval also Denies recent fever or chills. Denies sinus pressure, nasal congestion, ear pain or sore throat. Denies chest congestion, productive cough or wheezing. Denies chest pains, palpitations and leg swelling Denies abdominal pain, nausea, vomiting,diarrhea or constipation.   Denies dysuria, frequency, hesitancy or incontinence. Denies joint pain, swelling and limitation in mobility. Denies headaches, seizures, numbness, or tingling. Denies depression, anxiety or insomnia. Denies skin break down or rash.       Observations/Objective: BP 126/74   Ht 4\' 10"  (1.473 m)   Wt 143 lb (64.9 kg)   BMI 29.89 kg/m  Good communication with no confusion and intact memory. Alert and oriented x 3 No signs of respiratory distress during speech    Assessment and Plan:  Allergic rhinitis Controlled, no change in medication   Hypothyroidism Controlled, no change in medication Managed by Endo  GERD (gastroesophageal reflux disease) Controlled, no change in medication    Follow Up Instructions:    I discussed the assessment and treatment plan with the patient. The patient  was provided an opportunity to ask questions and all were answered. The patient agreed with the plan and demonstrated an understanding of the instructions.   The patient was advised to call back or seek an in-person evaluation if the symptoms worsen or if the condition fails to improve as anticipated.  I provided 15 minutes of non-face-to-face time during this encounter.   [name], MD

## 2019-05-30 NOTE — Patient Instructions (Signed)
F/U with MD in office in 6 months, call if you need me before   All the best with upcoming surgery  Fasting CBC, lipid, cmp and eGFR, tSH , free T4, hepatitis C screen and vit D, last week in December, [facility] (needs new order mailed please)  No changes in medication  It is important that you exercise regularly at least 30 minutes 5 times a week. If you develop chest pain, have severe difficulty breathing, or feel very tired, stop exercising immediately and seek medical attention  Think about what you will eat, plan ahead. Choose " clean, green, fresh or frozen" over canned, processed or packaged foods which are more sugary, salty and fatty. 70 to 75% of food eaten should be vegetables and fruit. Three meals at set times with snacks allowed between meals, but they must be fruit or vegetables. Aim to eat over a 12 hour period , example 7 am to 7 pm, and STOP after  your last meal of the day. Drink water,generally about 64 ounces per day, no other drink is as healthy. Fruit juice is best enjoyed in a healthy way, by EATING the fruit. Thanks for choosing [facility], we consider it a privelige to serve you.  

## 2019-05-31 ENCOUNTER — Other Ambulatory Visit: Payer: Self-pay

## 2019-05-31 ENCOUNTER — Encounter (HOSPITAL_COMMUNITY): Payer: Self-pay

## 2019-05-31 ENCOUNTER — Other Ambulatory Visit (HOSPITAL_COMMUNITY)
Admission: RE | Admit: 2019-05-31 | Discharge: 2019-05-31 | Disposition: A | Discharge status: Home / Self Care | Payer: PPO | Source: Ambulatory Visit | Attending: Ophthalmology | Admitting: Ophthalmology

## 2019-05-31 DIAGNOSIS — Z20828 Contact with and (suspected) exposure to other viral communicable diseases: Secondary | ICD-10-CM | POA: Diagnosis not present

## 2019-05-31 DIAGNOSIS — Z01812 Encounter for preprocedural laboratory examination: Secondary | ICD-10-CM | POA: Insufficient documentation

## 2019-05-31 LAB — SARS CORONAVIRUS 2 (TAT 6-24 HRS): SARS Coronavirus 2: NEGATIVE

## 2019-06-01 ENCOUNTER — Encounter (HOSPITAL_COMMUNITY)
Admission: RE | Admit: 2019-06-01 | Discharge: 2019-06-01 | Disposition: A | Discharge status: Home / Self Care | Payer: PPO | Source: Ambulatory Visit | Attending: Ophthalmology | Admitting: Ophthalmology

## 2019-06-01 ENCOUNTER — Other Ambulatory Visit (HOSPITAL_COMMUNITY): Admission: RE | Admit: 2019-06-01 | Payer: PPO | Source: Ambulatory Visit

## 2019-06-03 ENCOUNTER — Encounter (HOSPITAL_COMMUNITY): Payer: Self-pay | Admitting: *Deleted

## 2019-06-03 ENCOUNTER — Ambulatory Visit (HOSPITAL_COMMUNITY): Payer: PPO | Admitting: Anesthesiology

## 2019-06-03 ENCOUNTER — Ambulatory Visit (HOSPITAL_COMMUNITY)
Admission: RE | Admit: 2019-06-03 | Discharge: 2019-06-03 | Disposition: A | Discharge status: Home / Self Care | Payer: PPO | Attending: Ophthalmology | Admitting: Ophthalmology

## 2019-06-03 ENCOUNTER — Encounter (HOSPITAL_COMMUNITY)
Admission: RE | Disposition: A | Discharge status: Home / Self Care | Payer: Self-pay | Source: Home / Self Care | Attending: Ophthalmology

## 2019-06-03 ENCOUNTER — Other Ambulatory Visit: Payer: Self-pay

## 2019-06-03 DIAGNOSIS — H25812 Combined forms of age-related cataract, left eye: Secondary | ICD-10-CM | POA: Diagnosis not present

## 2019-06-03 DIAGNOSIS — Z20828 Contact with and (suspected) exposure to other viral communicable diseases: Secondary | ICD-10-CM | POA: Insufficient documentation

## 2019-06-03 DIAGNOSIS — H2512 Age-related nuclear cataract, left eye: Secondary | ICD-10-CM | POA: Diagnosis not present

## 2019-06-03 HISTORY — PX: CATARACT EXTRACTION W/PHACO: SHX586

## 2019-06-03 SURGERY — PHACOEMULSIFICATION, CATARACT, WITH IOL INSERTION
Anesthesia: Monitor Anesthesia Care | Site: Eye | Laterality: Left

## 2019-06-03 MED ORDER — LACTATED RINGERS IV SOLN: INTRAVENOUS | Status: DC

## 2019-06-03 MED ORDER — LIDOCAINE HCL (PF) 1 % IJ SOLN
INTRAOCULAR | Status: DC | PRN
  Administered 2019-06-03: 1 mL via OPHTHALMIC

## 2019-06-03 MED ORDER — PROMETHAZINE HCL 25 MG/ML IJ SOLN: 6.2500 mg | INTRAMUSCULAR | Status: DC | PRN

## 2019-06-03 MED ORDER — CYCLOPENTOLATE-PHENYLEPHRINE 0.2-1 % OP SOLN
1.0000 [drp] | OPHTHALMIC | Status: AC | PRN
  Administered 2019-06-03 (×3): 1 [drp] via OPHTHALMIC

## 2019-06-03 MED ORDER — MIDAZOLAM HCL 2 MG/2ML IJ SOLN
INTRAMUSCULAR | Status: AC
  Filled 2019-06-03: qty 2

## 2019-06-03 MED ORDER — TETRACAINE HCL 0.5 % OP SOLN
1.0000 [drp] | OPHTHALMIC | Status: AC | PRN
  Administered 2019-06-03 (×3): 1 [drp] via OPHTHALMIC

## 2019-06-03 MED ORDER — ONDANSETRON HCL 4 MG/2ML IJ SOLN
INTRAMUSCULAR | Status: DC | PRN
  Administered 2019-06-03: 4 mg via INTRAVENOUS

## 2019-06-03 MED ORDER — HYDROCODONE-ACETAMINOPHEN 7.5-325 MG PO TABS: 1.0000 | ORAL_TABLET | Freq: Once | ORAL | Status: DC | PRN

## 2019-06-03 MED ORDER — BSS IO SOLN
INTRAOCULAR | Status: DC | PRN
  Administered 2019-06-03: 15 mL via INTRAOCULAR

## 2019-06-03 MED ORDER — HYDROMORPHONE HCL 1 MG/ML IJ SOLN: 0.2500 mg | INTRAMUSCULAR | Status: DC | PRN

## 2019-06-03 MED ORDER — NEOMYCIN-POLYMYXIN-DEXAMETH 3.5-10000-0.1 OP SUSP
OPHTHALMIC | Status: DC | PRN
  Administered 2019-06-03: 1 [drp] via OPHTHALMIC

## 2019-06-03 MED ORDER — EPINEPHRINE PF 1 MG/ML IJ SOLN
INTRAMUSCULAR | Status: AC
  Filled 2019-06-03: qty 2

## 2019-06-03 MED ORDER — SODIUM HYALURONATE 23 MG/ML IO SOLN
INTRAOCULAR | Status: DC | PRN
  Administered 2019-06-03: 0.6 mL via INTRAOCULAR

## 2019-06-03 MED ORDER — MIDAZOLAM HCL 5 MG/5ML IJ SOLN
INTRAMUSCULAR | Status: DC | PRN
  Administered 2019-06-03: 2 mg via INTRAVENOUS

## 2019-06-03 MED ORDER — PHENYLEPHRINE HCL 2.5 % OP SOLN
1.0000 [drp] | OPHTHALMIC | Status: AC | PRN
  Administered 2019-06-03 (×3): 1 [drp] via OPHTHALMIC

## 2019-06-03 MED ORDER — TETRACAINE 0.5 % OP SOLN OPTIME - NO CHARGE
OPHTHALMIC | Status: DC | PRN
  Administered 2019-06-03: 1 [drp] via OPHTHALMIC

## 2019-06-03 MED ORDER — EPINEPHRINE PF 1 MG/ML IJ SOLN
INTRAOCULAR | Status: DC | PRN
  Administered 2019-06-03: 500 mL

## 2019-06-03 MED ORDER — PROVISC 10 MG/ML IO SOLN
INTRAOCULAR | Status: DC | PRN
  Administered 2019-06-03: 0.85 mL via INTRAOCULAR

## 2019-06-03 MED ORDER — POVIDONE-IODINE 5 % OP SOLN
OPHTHALMIC | Status: DC | PRN
  Administered 2019-06-03: 1 via OPHTHALMIC

## 2019-06-03 MED ORDER — LIDOCAINE HCL 3.5 % OP GEL
1.0000 "application " | Freq: Once | OPHTHALMIC | Status: AC
  Administered 2019-06-03: 1 via OPHTHALMIC

## 2019-06-03 SURGICAL SUPPLY — 18 items
CLOTH BEACON ORANGE TIMEOUT ST (SAFETY) ×1 IMPLANT
DEVICE MILOOP (MISCELLANEOUS) IMPLANT
EYE SHIELD UNIVERSAL CLEAR (GAUZE/BANDAGES/DRESSINGS) ×2 IMPLANT
GLOVE BIOGEL PI IND STRL 6.5 (GLOVE) ×1 IMPLANT
GLOVE BIOGEL PI IND STRL 7.0 (GLOVE) IMPLANT
GLOVE BIOGEL PI INDICATOR 6.5 (GLOVE) ×1
GLOVE BIOGEL PI INDICATOR 7.0 (GLOVE) ×1
LENS ALC ACRYL/TECN (Ophthalmic Related) ×2 IMPLANT
MILOOP DEVICE (MISCELLANEOUS)
NEEDLE HYPO 18GX1.5 BLUNT FILL (NEEDLE) ×2 IMPLANT
PAD ARMBOARD 7.5X6 YLW CONV (MISCELLANEOUS) ×1 IMPLANT
RING MALYGIN (MISCELLANEOUS) IMPLANT
RING MALYGIN 7.0 (MISCELLANEOUS) IMPLANT
SYR TB 1ML LL NO SAFETY (SYRINGE) ×1 IMPLANT
TAPE SURG TRANSPORE 1 IN (GAUZE/BANDAGES/DRESSINGS) ×1 IMPLANT
TAPE SURGICAL TRANSPORE 1 IN (GAUZE/BANDAGES/DRESSINGS) ×1
VISCOELASTIC ADDITIONAL (OPHTHALMIC RELATED) ×1 IMPLANT
WATER STERILE IRR 250ML POUR (IV SOLUTION) ×1 IMPLANT

## 2019-06-03 NOTE — Anesthesia Preprocedure Evaluation (Signed)
Anesthesia Evaluation  Patient identified by MRN, date of birth, ID band Patient awake    Reviewed: Allergy & Precautions, NPO status , Patient's Chart, lab work & pertinent test results  History of Anesthesia Complications (+) PONV, PROLONGED EMERGENCE and history of anesthetic complications  Airway Mallampati: II  TM Distance: >3 FB Neck ROM: Full    Dental no notable dental hx. (+) Teeth Intact   Pulmonary neg pulmonary ROS,    Pulmonary exam normal breath sounds clear to auscultation       Cardiovascular Exercise Tolerance: Good + CAD  Normal cardiovascular examI Rhythm:Regular Rate:Normal  States walks 3 miles/day  CAD from chart    Neuro/Psych Anxiety negative neurological ROS  negative psych ROS   GI/Hepatic Neg liver ROS, hiatal hernia, GERD  Medicated and Controlled,  Endo/Other  Hypothyroidism   Renal/GU negative Renal ROS  negative genitourinary   Musculoskeletal negative musculoskeletal ROS (+)   Abdominal   Peds negative pediatric ROS (+)  Hematology negative hematology ROS (+)   Anesthesia Other Findings   Reproductive/Obstetrics negative OB ROS                             Anesthesia Physical Anesthesia Plan  ASA: II  Anesthesia Plan: MAC   Post-op Pain Management:    Induction: Intravenous  PONV Risk Score and Plan: 2 and Midazolam, TIVA, Treatment may vary due to age or medical condition and Ondansetron  Airway Management Planned: Simple Face Mask and Nasal Cannula  Additional Equipment:   Intra-op Plan:   Post-operative Plan:   Informed Consent: I have reviewed the patients History and Physical, chart, labs and discussed the procedure including the risks, benefits and alternatives for the proposed anesthesia with the patient or authorized representative who has indicated his/her understanding and acceptance.     Dental advisory given  Plan Discussed  with: CRNA  Anesthesia Plan Comments: (Plan Full PPE use  Plan MAC d/w pt -WTP with same after Q&A  Pt anxious -states wants a little relaxation medication- reassured -explained she will be awake and comfortable )        Anesthesia Quick Evaluation

## 2019-06-03 NOTE — Transfer of Care (Signed)
Immediate Anesthesia Transfer of Care Note  Patient: Kim Grimes  Procedure(s) Performed: CATARACT EXTRACTION PHACO AND INTRAOCULAR LENS PLACEMENT LEFT EYE (CDE: 2.92) (Left Eye)  Patient Location: PACU  Anesthesia Type:MAC  Level of Consciousness: awake  Airway & Oxygen Therapy: Patient Spontanous Breathing  Post-op Assessment: Report given to RN  Post vital signs: Reviewed and stable  Last Vitals:  Vitals Value Taken Time  BP    Temp    Pulse    Resp    SpO2      Last Pain:  Vitals:   06/03/19 0943  TempSrc: Oral  PainSc: 0-No pain         Complications: No apparent anesthesia complications

## 2019-06-03 NOTE — Op Note (Signed)
Date of procedure: 06/03/19  Pre-operative diagnosis: Visually significant age-related combined cataract, Left Eye (H25.812)  Post-operative diagnosis: Visually significant age-related combined cataract, Left Eye (H25.812)  Procedure: Removal of cataract via phacoemulsification and insertion of intra-ocular lens Wynetta Emery and [place]  +17.0D into the capsular bag of the Left Eye  Attending surgeon: Gerda Diss. Wrzosek, MD, MA  Anesthesia: MAC, Topical [place]  Complications: None  Estimated Blood Loss: <32m (minimal)  Specimens: None  Implants: As above  Indications:  Visually significant age-related cataract, Left Eye  Procedure:  The patient was seen and identified in the pre-operative area. The operative eye was identified and dilated.  The operative eye was marked.  Topical anesthesia was administered to the operative eye.     The patient was then to the operative suite and placed in the supine position.  A timeout was performed confirming the patient, procedure to be performed, and all other relevant information.   The patient's face was prepped and draped in the usual fashion for intra-ocular surgery.  A lid speculum was placed into the operative eye and the surgical microscope moved into place and focused.  An inferotemporal paracentesis was created using a 20 gauge paracentesis blade.  Shugarcaine was injected into the anterior chamber.  Viscoelastic was injected into the anterior chamber.  A temporal clear-corneal main wound incision was created using a 2.464mmicrokeratome.  A continuous curvilinear capsulorrhexis was initiated using an irrigating cystitome and completed using capsulorrhexis forceps.  Hydrodissection and hydrodeliniation were performed.  Viscoelastic was injected into the anterior chamber.  A phacoemulsification handpiece and a chopper as a second instrument were used to remove the nucleus and epinucleus. The irrigation/aspiration handpiece was used to remove  any remaining cortical material.   The capsular bag was reinflated with viscoelastic, checked, and found to be intact.  The intraocular lens was inserted into the capsular bag.  The irrigation/aspiration handpiece was used to remove any remaining viscoelastic.  The clear corneal wound and paracentesis wounds were then hydrated and checked with Weck-Cels to be watertight.  The lid-speculum and drape was removed, and the patient's face was cleaned with a wet and dry 4x4.  Maxitrol was instilled in the eye before a clear shield was taped over the eye. The patient was taken to the post-operative care unit in good condition, having tolerated the procedure well.  Post-Op Instructions: [facility] and will receive all other orders and instructions.

## 2019-06-03 NOTE — Discharge Instructions (Addendum)
Please discharge patient when stable, will follow up today with Dr. Wrzosek at the [facility] office immediately following discharge.  Leave shield in place until visit.  All paperwork with discharge instructions will be given at the office. ° ° ° ° °Monitored Anesthesia Care, Care After °These instructions provide you with information about caring for yourself after your procedure. Your health care provider may also give you more specific instructions. Your treatment has been planned according to current medical practices, but problems sometimes occur. Call your health care provider if you have any problems or questions after your procedure. °What can I expect after the procedure? °After your procedure, you may: °· Feel sleepy for several hours. °· Feel clumsy and have poor balance for several hours. °· Feel forgetful about what happened after the procedure. °· Have poor judgment for several hours. °· Feel nauseous or vomit. °· Have a sore throat if you had a breathing tube during the procedure. °Follow these instructions at home: °For at least 24 hours after the procedure: ° °  ° °· Have a responsible adult stay with you. It is important to have someone help care for you until you are awake and alert. °· Rest as needed. °· Do not: °? Participate in activities in which you could fall or become injured. °? Drive. °? Use heavy machinery. °? Drink alcohol. °? Take sleeping pills or medicines that cause drowsiness. °? Make important decisions or sign legal documents. °? Take care of children on your own. °Eating and drinking °· Follow the diet that is recommended by your health care provider. °· If you vomit, drink water, juice, or soup when you can drink without vomiting. °· Make sure you have little or no nausea before eating solid foods. °General instructions °· Take over-the-counter and prescription medicines only as told by your health care provider. °· If you have sleep apnea, surgery and certain  medicines can increase your risk for breathing problems. Follow instructions from your health care provider about wearing your sleep device: °? Anytime you are sleeping, including during daytime naps. °? While taking prescription pain medicines, sleeping medicines, or medicines that make you drowsy. °· If you smoke, do not smoke without supervision. °· Keep all follow-up visits as told by your health care provider. This is important. °Contact a health care provider if: °· You keep feeling nauseous or you keep vomiting. °· You feel light-headed. °· You develop a rash. °· You have a fever. °Get help right away if: °· You have trouble breathing. °Summary °· For several hours after your procedure, you may feel sleepy and have poor judgment. °· Have a responsible adult stay with you for at least 24 hours or until you are awake and alert. °This information is not intended to replace advice given to you by your health care provider. Make sure you discuss any questions you have with your health care provider. °Document Released: 10/07/2015 Document Revised: 09/14/2017 Document Reviewed: 10/07/2015 °Elsevier Patient Education © 2020 [employer] ° °

## 2019-06-03 NOTE — Progress Notes (Signed)
Call received from Patient 06/03/2019 at 2:17pm.  Patient states "my eye is watering".  Patient verbalized she did go to [facility] post procedure. Verified her postoperative instructions with patient as she read them over the telephone verbally. Patient verbalized understanding.

## 2019-06-03 NOTE — Anesthesia Postprocedure Evaluation (Signed)
Anesthesia Post Note  Patient: Kim Grimes  Procedure(s) Performed: CATARACT EXTRACTION PHACO AND INTRAOCULAR LENS PLACEMENT LEFT EYE (CDE: 2.92) (Left Eye)  Patient location during evaluation: Short Stay Anesthesia Type: MAC Level of consciousness: awake and alert and oriented Pain management: satisfactory to patient Vital Signs Assessment: post-procedure vital signs reviewed and stable Respiratory status: patient remains intubated per anesthesia plan Cardiovascular status: blood pressure returned to baseline and stable Postop Assessment: no apparent nausea or vomiting Anesthetic complications: no     Last Vitals:  Vitals:   06/03/19 0943  BP: 124/76  Pulse: 70  Temp: 36.7 C  SpO2: 97%    Last Pain:  Vitals:   06/03/19 0943  TempSrc: Oral  PainSc: 0-No pain                 DANIEL,KAREN

## 2019-06-03 NOTE — Interval H&P Note (Signed)
History and Physical Interval Note: The H and P was reviewed and updated. The patient was examined.  No changes were found after exam.  The surgical eye was marked.  06/03/2019 10:22 AM  Perlie Gold  has presented today for surgery, with the diagnosis of Nuclear sclerotic cataract - Left eye.  The various methods of treatment have been discussed with the patient and family. After consideration of risks, benefits and other options for treatment, the patient has consented to  Procedure(s): CATARACT EXTRACTION PHACO AND INTRAOCULAR LENS PLACEMENT LEFT EYE (Left) as a surgical intervention.  The patient's history has been reviewed, patient examined, no change in status, stable for surgery.  I have reviewed the patient's chart and labs.  Questions were answered to the patient's satisfaction.     [name]

## 2019-06-04 NOTE — Assessment & Plan Note (Signed)
Controlled, no change in medication  

## 2019-06-04 NOTE — Assessment & Plan Note (Signed)
Controlled, no change in medication  Managed by Endo 

## 2019-06-06 ENCOUNTER — Encounter (HOSPITAL_COMMUNITY): Payer: Self-pay | Admitting: Ophthalmology

## 2019-06-09 ENCOUNTER — Encounter: Payer: Self-pay | Admitting: Family Medicine

## 2019-06-09 ENCOUNTER — Telehealth: Payer: Self-pay | Admitting: Orthopedic Surgery

## 2019-06-09 ENCOUNTER — Ambulatory Visit (INDEPENDENT_AMBULATORY_CARE_PROVIDER_SITE_OTHER): Payer: PPO | Admitting: Family Medicine

## 2019-06-09 ENCOUNTER — Other Ambulatory Visit: Payer: Self-pay

## 2019-06-09 VITALS — BP 110/70 | HR 74 | Temp 98.5°F | Resp 15 | Ht <= 58 in | Wt 145.4 lb

## 2019-06-09 DIAGNOSIS — M79604 Pain in right leg: Secondary | ICD-10-CM

## 2019-06-09 DIAGNOSIS — W19XXXA Unspecified fall, initial encounter: Secondary | ICD-10-CM | POA: Diagnosis not present

## 2019-06-09 MED ORDER — TRAMADOL HCL 50 MG PO TABS: 50.0000 mg | ORAL_TABLET | Freq: Two times a day (BID) | ORAL | 0 refills | Status: AC | PRN

## 2019-06-09 NOTE — Telephone Encounter (Signed)
Patient called to relay she had a fall about one week ago, and requests appointment with Dr Aline Brochure. Relayed Dr is in surgery today, and Dr Luna Glasgow is also out of clinic. Relayed protocol due to no immediate opening of Emergency room, urgent care, or primary care provider; aware to contact our office afterwards regarding appointment if recommended to see Dr Aline Brochure.

## 2019-06-09 NOTE — Patient Instructions (Signed)
  I appreciate the opportunity to provide you with care for your health and wellness. Today we discussed: recent fall with leg pain  Follow up: 11/29/2019  No labs or referrals today  Please take Ultram for next several days to help with pain. Use tylenol and heating pad as well.  Signs to call the office back: Redness below pain site Swelling below pain site Pain not controlled with medication  Numbness or sensation changes.   I hope you have a wonderful, happy, safe, and healthy Holiday Season! See you in the New Year :)  Please continue to practice social distancing to keep you, your family, and our community safe.  If you must go out, please wear a mask and practice good handwashing.  It was a pleasure to see you and I look forward to continuing to work together on your health and well-being. Please do not hesitate to call the office if you need care or have questions about your care.  Have a wonderful day and week. With Gratitude, Cherly Beach, DNP, AGNP-BC

## 2019-06-09 NOTE — Progress Notes (Signed)
Subjective:     Patient ID: Kim Grimes, female   DOB: 04/07/1951, 68 y.o.   MRN: [id number]  Kim Grimes presents for Leg Pain (right leg)  Kim Grimes is a 68 year old female patient with a history of anxiety disorder, CAD, endometriosis, GERD, hypothyroidism, irritable bowel among others.  Presents today after having a fall last week.  This is her first initial encounter after the fall.  Was trying to paint her bathroom and was on a stepladder slipped back out of her bedroom shoe and slid down the ladder.  Reports that she hit her calf muscle on the right side and has had discomfort and pain ever since.  Reports it hurts when she walks it was not as bad the first day but the next day she was in a lot of pain.  She reports that it was more of the slide down the steps then an actual fall and like tumbling.  She reports walking aggravates it when getting started but overall eases up as she keeps going.  Reports she tried icy hot, heating pad and this is helped some.  Has not tried Tylenol yet.  Reports the pain is intermittent but when it is present it is an 8 out of 10.  Wanted to use some Voltaren gel but has an allergy to aspirin.  Wondered if there is anything she can take the next couple days to help get her for the initial discomfort the pain.  She denies having any sensation changes.  She denies having any discoloration bruising redness or swelling below the site.  She denies having any changes to her circulation.   Today patient denies signs and symptoms of COVID 19 infection including fever, chills, cough, shortness of breath, and headache.  Past Medical, Surgical, Social History, Allergies, and Medications have been Reviewed.  Past Medical History:  Diagnosis Date  . Anxiety disorder   . Carotid artery stenosis   . Chronic pelvic pain in female   . Coronary artery disease   . Endometriosis   . GERD (gastroesophageal reflux disease)   . Helicobacter pylori gastritis     Dr. Olevia Perches   . Hiatal hernia 2003   on UGI   . Hypothyroidism 2009  . IBS (irritable bowel syndrome) 2003  . Obesity (BMI 30-39.9) DEC 2011 145 LBS  . Osteoporosis   . PONV (postoperative nausea and vomiting)    Past Surgical History:  Procedure Laterality Date  . ABDOMINAL HYSTERECTOMY    . adhesiolysis     dense adhesion between rectum and sigmoid,  & pelvis   . BRAVO [place] STUDY  01/19/2012   Procedure: BRAVO [place];  Surgeon: [name], MD;  Location: [facility];  Service: Endoscopy;;  . CATARACT EXTRACTION W/PHACO Left 06/03/2019   Procedure: CATARACT EXTRACTION PHACO AND INTRAOCULAR LENS PLACEMENT LEFT EYE (CDE: 2.92);  Surgeon: [name], MD;  Location: [facility];  Service: Ophthalmology;  Laterality: Left;  . COLONOSCOPY  03: AP/D & 08: SCREENING   WNL'S  . COLONOSCOPY  07/28/2011   sessile polyp in ascending colon/internal hemorrhoids. Next colonoscopy January 2018  . COLONOSCOPY N/A 08/01/2016   Procedure: COLONOSCOPY;  Surgeon: [name], MD;  Location: [facility];  Service: Endoscopy;  Laterality: N/A;  930   . ESOPHAGOGASTRODUODENOSCOPY  01/19/2012   SLF: SMALL Hiatal hernia/Mild gastritis  . EYE SURGERY Right 10/18/2013   catarat extraction  . LAPAROSCOPY    . OLECRANON BURSECTOMY Left 07/01/2018  Procedure: LEFT ELBOW OLECRANON BURSECTOMY AND SPUR EXCISION;  Surgeon: [name], MD;  Location: [facility];  Service: Orthopedics;  Laterality: Left;  . rt foot sx Bilateral   . s/p hysterectomy    . SALPINGOOPHORECTOMY    . UPPER GASTROINTESTINAL ENDOSCOPY  DEC 2010: DYSPEPSIA/DYSPHAGIA   [place] RING/ESO DIL 16 MM, GASTRITIS/DUODENITIS 2o to Aleve/on OMP PRN   Social History   Socioeconomic History  . Marital status: Divorced    Spouse name: Not on file  . Number of children: Not on file  . Years of education: Not on file  . Highest education level: Not on file  Occupational History  . Occupation: fulltime at [employer]   Tobacco  Use  . Smoking status: Never Smoker  . Smokeless tobacco: Never Used  . Tobacco comment: Never smoked  Substance and Sexual Activity  . Alcohol use: Yes    Alcohol/week: 0.0 standard drinks    Comment: occasional, maybe once/year  . Drug use: No  . Sexual activity: Not Currently    Birth control/protection: None  Other Topics Concern  . Not on file  Social History Narrative  . Not on file   Social Determinants of Health   Financial Resource Strain: Low Risk   . Difficulty of Paying Living Expenses: Not hard at all  Food Insecurity: No Food Insecurity  . Worried About Charity fundraiser in the Last Year: Never true  . Ran Out of Food in the Last Year: Never true  Transportation Needs: No Transportation Needs  . Lack of Transportation (Medical): No  . Lack of Transportation (Non-Medical): No  Physical Activity: Sufficiently Active  . Days of Exercise per Week: 4 days  . Minutes of Exercise per Session: 60 min  Stress: No Stress Concern Present  . Feeling of Stress : Only a little  Social Connections: Slightly Isolated  . Frequency of Communication with Friends and Family: Three times a week  . Frequency of Social Gatherings with Friends and Family: Three times a week  . Attends Religious Services: More than 4 times per year  . Active Member of Clubs or Organizations: Yes  . Attends Archivist Meetings: Never  . Marital Status: Divorced  Human resources officer Violence: Not At Risk  . Fear of Current or Ex-Partner: No  . Emotionally Abused: No  . Physically Abused: No  . Sexually Abused: No    Outpatient Encounter Medications as of 06/09/2019  Medication Sig  . fluticasone (FLONASE) 50 MCG/ACT nasal spray Place 2 sprays into both nostrils daily. (Patient taking differently: Place 2 sprays into both nostrils daily as needed for allergies. )  . levothyroxine (SYNTHROID, LEVOTHROID) 112 MCG tablet Take 1 tablet (112 mcg total) by mouth daily.  Marland Kitchen omeprazole (PRILOSEC)  20 MG capsule Take 1 capsule (20 mg total) by mouth daily before breakfast.   No facility-administered encounter medications on file as of 06/09/2019.   Allergies  Allergen Reactions  . Aspirin Other (See Comments)    "blood rushing"  . Diazepam Other (See Comments)    Aggitation    Review of Systems  Constitutional: Negative for chills and fever.  HENT: Negative.   Eyes: Negative.   Respiratory: Negative.   Cardiovascular: Negative.   Gastrointestinal: Negative.   Endocrine: Negative.   Genitourinary: Negative.   Musculoskeletal: Positive for gait problem.  Skin: Negative.   Allergic/Immunologic: Negative.   Hematological: Negative.   Psychiatric/Behavioral: Negative.   All other systems reviewed and are negative.  Objective:     BP 110/70   Pulse 74   Temp 98.5 F (36.9 C) (Oral)   Resp 15   Ht 4\' 10"  (1.473 m)   Wt 145 lb 6.4 oz (66 kg)   SpO2 99%   BMI 30.39 kg/m   Physical Exam Vitals and nursing note reviewed.  Constitutional:      Appearance: Normal appearance. She is overweight.  HENT:     Head: Normocephalic and atraumatic.     Right Ear: External ear normal.     Left Ear: External ear normal.     Nose: Nose normal.     Mouth/Throat:     Pharynx: Oropharynx is clear.  Eyes:     General:        Right eye: No discharge.        Left eye: No discharge.     Conjunctiva/sclera: Conjunctivae normal.  Cardiovascular:     Rate and Rhythm: Normal rate and regular rhythm.     Pulses: Normal pulses.     Heart sounds: Normal heart sounds.  Pulmonary:     Effort: Pulmonary effort is normal.     Breath sounds: Normal breath sounds.  Musculoskeletal:     Cervical back: Normal range of motion and neck supple.       Legs:  Skin:    General: Skin is warm.  Neurological:     General: No focal deficit present.     Mental Status: She is alert and oriented to person, place, and time.     Sensory: Sensation is intact.     Motor: Motor function is  intact.     Coordination: Coordination is intact.     Gait: Gait is intact.     Comments: Mild difficulty with bending and getting up out of chair but overall able to walk once started.  Psychiatric:        Mood and Affect: Mood normal.        Behavior: Behavior normal.        Thought Content: Thought content normal.        Judgment: Judgment normal.        Assessment and Plan       1. Fall, initial encounter Initial fall encounter about 7 days ago.  Reports it is starting to improve but she just has discomfort and would like to get a little something for it if possible.  Provided with Ultram as she is unable to tolerate aspirin and therefore certain NSAIDs are out of the question.  Such as Voltaren which she had asked about from a friend.  Reviewed side effects, risks and benefits of medication. Patient acknowledged agreement and understanding of the plan.  Provided signs and symptoms of what to look for for possible clot and or break.  Do not think it is a fracture at this time given that she is able to put weight on it very well and the pain is started to get better over the last duration.  No signs and symptoms of a blood clot pleasant at this time.  She was advised on when to notify the office if anything changes.   - traMADol (ULTRAM) 50 MG tablet; Take 1 tablet (50 mg total) by mouth every 12 (twelve) hours as needed for up to 5 days for moderate pain.  Dispense: 10 tablet; Refill: 0   Follow-up: 11/29/2019  Kim Mayo, DNP, AGNP-BC [address]  [place], [place] [address] Office Hours: Mon-Thurs 8 am-5 pm; Fri 8 am-12 pm Office Phone:  [phone]  Office Fax: [phone]

## 2019-07-04 ENCOUNTER — Other Ambulatory Visit: Payer: Self-pay | Admitting: "Endocrinology

## 2019-07-04 ENCOUNTER — Ambulatory Visit: Payer: PPO | Admitting: Orthopedic Surgery

## 2019-07-04 DIAGNOSIS — E039 Hypothyroidism, unspecified: Secondary | ICD-10-CM

## 2019-07-04 DIAGNOSIS — R7989 Other specified abnormal findings of blood chemistry: Secondary | ICD-10-CM | POA: Diagnosis not present

## 2019-07-04 DIAGNOSIS — E559 Vitamin D deficiency, unspecified: Secondary | ICD-10-CM | POA: Diagnosis not present

## 2019-07-04 DIAGNOSIS — Z1159 Encounter for screening for other viral diseases: Secondary | ICD-10-CM | POA: Diagnosis not present

## 2019-07-05 LAB — CMP14+EGFR
ALT: 13 IU/L (ref 0–32)
AST: 17 IU/L (ref 0–40)
Albumin/Globulin Ratio: 1.9 (ref 1.2–2.2)
Albumin: 4.3 g/dL (ref 3.8–4.8)
Alkaline Phosphatase: 93 IU/L (ref 39–117)
BUN/Creatinine Ratio: 20 (ref 12–28)
BUN: 17 mg/dL (ref 8–27)
Bilirubin Total: 0.3 mg/dL (ref 0.0–1.2)
CO2: 24 mmol/L (ref 20–29)
Calcium: 9.7 mg/dL (ref 8.7–10.3)
Chloride: 103 mmol/L (ref 96–106)
Creatinine, Ser: 0.84 mg/dL (ref 0.57–1.00)
GFR calc Af Amer: 83 mL/min/{1.73_m2} (ref >59)
GFR calc non Af Amer: 72 mL/min/{1.73_m2} (ref >59)
Globulin, Total: 2.3 g/dL (ref 1.5–4.5)
Glucose: 84 mg/dL (ref 65–99)
Potassium: 4.4 mmol/L (ref 3.5–5.2)
Sodium: 141 mmol/L (ref 134–144)
Total Protein: 6.6 g/dL (ref 6.0–8.5)

## 2019-07-05 LAB — CBC
Hematocrit: 41.3 % (ref 34.0–46.6)
Hemoglobin: 13.7 g/dL (ref 11.1–15.9)
MCH: 29.3 pg (ref 26.6–33.0)
MCHC: 33.2 g/dL (ref 31.5–35.7)
MCV: 88 fL (ref 79–97)
Platelets: 244 10*3/uL (ref 150–450)
RBC: 4.67 x10E6/uL (ref 3.77–5.28)
RDW: 12.9 % (ref 11.7–15.4)
WBC: 5.2 10*3/uL (ref 3.4–10.8)

## 2019-07-05 LAB — LIPID PANEL
Chol/HDL Ratio: 3.2 ratio (ref 0.0–4.4)
Cholesterol, Total: 215 mg/dL — ABNORMAL HIGH (ref 100–199)
HDL: 68 mg/dL (ref >39)
LDL Chol Calc (NIH): 133 mg/dL — ABNORMAL HIGH (ref 0–99)
Triglycerides: 80 mg/dL (ref 0–149)
VLDL Cholesterol Cal: 14 mg/dL (ref 5–40)

## 2019-07-05 LAB — HEPATITIS C ANTIBODY: Hep C Virus Ab: <0.1 s/co ratio (ref 0.0–0.9)

## 2019-07-05 LAB — TSH: TSH: 0.504 u[IU]/mL (ref 0.450–4.500)

## 2019-07-05 LAB — T4, FREE: Free T4: 1.95 ng/dL — ABNORMAL HIGH (ref 0.82–1.77)

## 2019-07-05 LAB — VITAMIN D 25 HYDROXY (VIT D DEFICIENCY, FRACTURES): Vit D, 25-Hydroxy: 18.1 ng/mL — ABNORMAL LOW (ref 30.0–100.0)

## 2019-07-06 ENCOUNTER — Other Ambulatory Visit: Payer: Self-pay | Admitting: Family Medicine

## 2019-07-06 ENCOUNTER — Encounter: Payer: Self-pay | Admitting: Family Medicine

## 2019-07-06 ENCOUNTER — Encounter: Payer: Self-pay | Admitting: "Endocrinology

## 2019-07-06 ENCOUNTER — Ambulatory Visit (INDEPENDENT_AMBULATORY_CARE_PROVIDER_SITE_OTHER): Payer: PPO | Admitting: "Endocrinology

## 2019-07-06 DIAGNOSIS — H524 Presbyopia: Secondary | ICD-10-CM | POA: Diagnosis not present

## 2019-07-06 DIAGNOSIS — E559 Vitamin D deficiency, unspecified: Secondary | ICD-10-CM

## 2019-07-06 DIAGNOSIS — E039 Hypothyroidism, unspecified: Secondary | ICD-10-CM | POA: Diagnosis not present

## 2019-07-06 MED ORDER — LEVOTHYROXINE SODIUM 100 MCG PO TABS: 100.0000 ug | ORAL_TABLET | Freq: Every day | ORAL | 3 refills | Status: DC

## 2019-07-06 MED ORDER — ERGOCALCIFEROL 1.25 MG (50000 UT) PO CAPS: 50000.0000 [IU] | ORAL_CAPSULE | ORAL | 1 refills | Status: DC

## 2019-07-06 NOTE — Progress Notes (Signed)
07/06/2019                                Endocrinology Telehealth Visit Follow up Note -During COVID -19 Pandemic  I connected with Kim Grimes on 07/06/2019   by telephone and verified that I am speaking with the correct person using two identifiers. Kim Grimes, February 13, 1951. she has verbally consented to this visit. All issues noted in this document were discussed and addressed. The format was not optimal for physical exam.   Subjective:    Patient ID: Kim Grimes, female    DOB: [birth year], PCP Kim Helper, MD   Past Medical History:  Diagnosis Date  . Anxiety disorder   . Carotid artery stenosis   . Chronic pelvic pain in female   . Coronary artery disease   . Endometriosis   . GERD (gastroesophageal reflux disease)   . Helicobacter pylori gastritis    Dr. Olevia Perches   . Hiatal hernia 2003   on UGI   . Hypothyroidism 2009  . IBS (irritable bowel syndrome) 2003  . Obesity (BMI 30-39.9) DEC 2011 145 LBS  . Osteoporosis   . PONV (postoperative nausea and vomiting)    Past Surgical History:  Procedure Laterality Date  . ABDOMINAL HYSTERECTOMY    . adhesiolysis     dense adhesion between rectum and sigmoid,  & pelvis   . BRAVO [place] STUDY  01/19/2012   Procedure: BRAVO [place];  Surgeon: Kim Binder, MD;  Location: [facility];  Service: Endoscopy;;  . CATARACT EXTRACTION W/PHACO Left 06/03/2019   Procedure: CATARACT EXTRACTION PHACO AND INTRAOCULAR LENS PLACEMENT LEFT EYE (CDE: 2.92);  Surgeon: [name], MD;  Location: [facility];  Service: Ophthalmology;  Laterality: Left;  . COLONOSCOPY  03: [facility]/D & 08: SCREENING   WNL'S  . COLONOSCOPY  07/28/2011   sessile polyp in ascending colon/internal hemorrhoids. Next colonoscopy January 2018  . COLONOSCOPY N/A 08/01/2016   Procedure: COLONOSCOPY;  Surgeon: Kim Binder, MD;  Location: [facility];  Service: Endoscopy;  Laterality: N/A;  930   . ESOPHAGOGASTRODUODENOSCOPY  01/19/2012   SLF: SMALL Hiatal  hernia/Mild gastritis  . EYE SURGERY Right 10/18/2013   catarat extraction  . LAPAROSCOPY    . OLECRANON BURSECTOMY Left 07/01/2018   Procedure: LEFT ELBOW OLECRANON BURSECTOMY AND SPUR EXCISION;  Surgeon: [name], MD;  Location: [facility];  Service: Orthopedics;  Laterality: Left;  . rt foot sx Bilateral   . s/p hysterectomy    . SALPINGOOPHORECTOMY    . UPPER GASTROINTESTINAL ENDOSCOPY  DEC 2010: DYSPEPSIA/DYSPHAGIA   [place] RING/ESO DIL 16 MM, GASTRITIS/DUODENITIS 2o to [place]/on OMP PRN   Social History   Socioeconomic History  . Marital status: Divorced    Spouse name: Not on file  . Number of children: Not on file  . Years of education: Not on file  . Highest education level: Not on file  Occupational History  . Occupation: fulltime at [employer]   Tobacco Use  . Smoking status: Never Smoker  . Smokeless tobacco: Never Used  . Tobacco comment: Never smoked  Substance and Sexual Activity  . Alcohol use: Yes    Alcohol/week: 0.0 standard drinks    Comment: occasional, maybe once/year  . Drug use: No  . Sexual activity: Not Currently    Birth control/protection: None  Other Topics Concern  . Not on file  Social History  Narrative  . Not on file   Social Determinants of Health   Financial Resource Strain:   . Difficulty of Paying Living Expenses: Not on file  Food Insecurity:   . Worried About Charity fundraiser in the Last Year: Not on file  . Ran Out of Food in the Last Year: Not on file  Transportation Needs:   . Lack of Transportation (Medical): Not on file  . Lack of Transportation (Non-Medical): Not on file  Physical Activity:   . Days of Exercise per Week: Not on file  . Minutes of Exercise per Session: Not on file  Stress:   . Feeling of Stress : Not on file  Social Connections:   . Frequency of Communication with Friends and Family: Not on file  . Frequency of Social Gatherings with Friends and Family: Not on file  . Attends Religious  Services: Not on file  . Active Member of Clubs or Organizations: Not on file  . Attends Archivist Meetings: Not on file  . Marital Status: Not on file   Outpatient Encounter Medications as of 07/06/2019  Medication Sig  . fluticasone (FLONASE) 50 MCG/ACT nasal spray Place 2 sprays into both nostrils daily. (Patient taking differently: Place 2 sprays into both nostrils daily as needed for allergies. )  . levothyroxine (SYNTHROID) 100 MCG tablet Take 1 tablet (100 mcg total) by mouth daily before breakfast.  . omeprazole (PRILOSEC) 20 MG capsule Take 1 capsule (20 mg total) by mouth daily before breakfast.  . [DISCONTINUED] levothyroxine (SYNTHROID, LEVOTHROID) 112 MCG tablet Take 1 tablet (112 mcg total) by mouth daily.   No facility-administered encounter medications on file as of 07/06/2019.   ALLERGIES: Allergies  Allergen Reactions  . Aspirin Other (See Comments)    "blood rushing"  . Diazepam Other (See Comments)    Aggitation   VACCINATION STATUS: Immunization History  Administered Date(s) Administered  . Fluad Quad(high Dose 65+) 02/28/2019  . Influenza,inj,Quad PF,6+ Mos 04/13/2013, 05/09/2014, 03/23/2015, 02/16/2018  . Pneumococcal Conjugate-13 03/15/2018  . Pneumococcal Polysaccharide-23 03/22/2019  . Tdap 09/18/2011  . Zoster 09/18/2011    HPI   69 yr old female with medical hx as above. She is being engaged in telehealth via telephone in follow-up for her hypothyroidism.   She has no new complaints.  She has lost approximately 10 pounds since last visit.  Her previsit labs are consistent with over replacement with levothyroxine.   -She denies any palpitations, tremors, nor heat intolerance.   she denies goiter. she has family hx of hypothyroidism. she denies any exposure to neck radiation.   Review of Systems Limited as above.  Objective:    There were no vitals taken for this visit.  Wt Readings from Last 3 Encounters:  06/09/19 145 lb 6.4 oz (66  kg)  05/30/19 143 lb (64.9 kg)  04/27/19 143 lb 1.3 oz (64.9 kg)        Recent Results (from the past 2160 hour(s))  SARS CORONAVIRUS 2 (TAT 6-24 HRS)     Status: None   Collection Time: 05/31/19  9:00 AM  Result Value Ref Range   SARS Coronavirus 2 NEGATIVE NEGATIVE    Comment: (NOTE) SARS-CoV-2 target nucleic acids are NOT DETECTED. The SARS-CoV-2 RNA is generally detectable in upper and lower respiratory specimens during the acute phase of infection. Negative results do not preclude SARS-CoV-2 infection, do not rule out co-infections with other pathogens, and should not be used as the sole basis for treatment  or other patient management decisions. Negative results must be combined with clinical observations, patient history, and epidemiological information. The expected result is Negative. Fact Sheet for Patients: SugarRoll.be Fact Sheet for Healthcare Providers: https://www.woods-mathews.com/ This test is not yet approved or cleared by the [place] FDA and  has been authorized for detection and/or diagnosis of SARS-CoV-2 by FDA under an Emergency Use Authorization (EUA). This EUA will remain  in effect (meaning this test can be used) for the duration of the COVID-19 declaration under Section 56 4(b)(1) of the Act, 21 U.S.C. section 360bbb-3(b)(1), unless the authorization is terminated or revoked sooner. Performed at [facility], [place] [address], [place], [place] [address]   TSH     Status: None   Collection Time: 07/04/19  9:25 AM  Result Value Ref Range   TSH 0.504 0.450 - 4.500 uIU/mL  T4, Free     Status: Abnormal   Collection Time: 07/04/19  9:25 AM  Result Value Ref Range   Free T4 1.95 (H) 0.82 - 1.77 ng/dL  CBC     Status: None   Collection Time: 07/04/19 11:42 AM  Result Value Ref Range   WBC 5.2 3.4 - 10.8 x10E3/uL   RBC 4.67 3.77 - 5.28 x10E6/uL   Hemoglobin 13.7 11.1 - 15.9 g/dL   Hematocrit 41.3 34.0  - 46.6 %   MCV 88 79 - 97 fL   MCH 29.3 26.6 - 33.0 pg   MCHC 33.2 31.5 - 35.7 g/dL   RDW 12.9 11.7 - 15.4 %   Platelets 244 150 - 450 x10E3/uL  Lipid Profile     Status: Abnormal   Collection Time: 07/04/19 11:42 AM  Result Value Ref Range   Cholesterol, Total 215 (H) 100 - 199 mg/dL   Triglycerides 80 0 - 149 mg/dL   HDL 68 >39 mg/dL   VLDL Cholesterol Cal 14 5 - 40 mg/dL   LDL Chol Calc (NIH) 133 (H) 0 - 99 mg/dL   Chol/HDL Ratio 3.2 0.0 - 4.4 ratio    Comment:                                   T. Chol/HDL Ratio                                             Men  Women                               1/2 Avg.Risk  3.4    3.3                                   Avg.Risk  5.0    4.4                                2X Avg.Risk  9.6    7.1                                3X Avg.Risk 23.4   11.0   Hepatitis C Antibody  Status: None   Collection Time: 07/04/19 11:42 AM  Result Value Ref Range   Hep C Virus Ab <0.1 0.0 - 0.9 s/co ratio    Comment:                                   Negative:     < 0.8                              Indeterminate: 0.8 - 0.9                                   Positive:     > 0.9  The CDC recommends that a positive HCV antibody result  be followed up with a HCV Nucleic Acid Amplification  test (532992).   Vitamin D (25 hydroxy)     Status: Abnormal   Collection Time: 07/04/19 11:42 AM  Result Value Ref Range   Vit D, 25-Hydroxy 18.1 (L) 30.0 - 100.0 ng/mL    Comment: Vitamin D deficiency has been defined by the [place] practice guideline as a level of serum 25-OH vitamin D less than 20 ng/mL (1,2). The Endocrine Society went on to further define vitamin D insufficiency as a level between 21 and 29 ng/mL (2). 1. IOM (Institute of Medicine). 2010. Dietary reference    intakes for calcium and D. [place]: The    [employer]. 2. Holick MF, Binkley Arnold, Bischoff-Ferrari HA, et al.    Evaluation,  treatment, and prevention of vitamin D    deficiency: an Endocrine Society clinical practice    guideline. JCEM. 2011 Jul; 96(7):1911-30.   CMP14+EGFR     Status: None   Collection Time: 07/04/19 11:42 AM  Result Value Ref Range   Glucose 84 65 - 99 mg/dL   BUN 17 8 - 27 mg/dL   Creatinine, Ser 0.84 0.57 - 1.00 mg/dL   GFR calc non Af Amer 72 >59 mL/min/1.73   GFR calc Af Amer 83 >59 mL/min/1.73   BUN/Creatinine Ratio 20 12 - 28   Sodium 141 134 - 144 mmol/L   Potassium 4.4 3.5 - 5.2 mmol/L   Chloride 103 96 - 106 mmol/L   CO2 24 20 - 29 mmol/L   Calcium 9.7 8.7 - 10.3 mg/dL   Total Protein 6.6 6.0 - 8.5 g/dL   Albumin 4.3 3.8 - 4.8 g/dL   Globulin, Total 2.3 1.5 - 4.5 g/dL   Albumin/Globulin Ratio 1.9 1.2 - 2.2   Bilirubin Total 0.3 0.0 - 1.2 mg/dL   Alkaline Phosphatase 93 39 - 117 IU/L   AST 17 0 - 40 IU/L   ALT 13 0 - 32 IU/L     Assessment & Plan:   1. hypothyroidism -Her recent thyroid function tests are consistent with over replacement.  I discussed and lowered her levothyroxine to 100 mcg p.o. daily before breakfast.     - We discussed about the correct intake of her thyroid hormone, on empty stomach at fasting, with water, separated by at least 30 minutes from breakfast and other medications,  and separated by more than 4 hours from calcium, iron, multivitamins, acid reflux medications (PPIs). -Patient is made aware of the fact that thyroid hormone replacement is needed for life, dose  to be adjusted by periodic monitoring of thyroid function tests.  2.  Vitamin D deficiency -Her most recent labs show vitamin D D level of 88.  She is encouraged to pick up her prescription for vitamin D2 50,000 units weekly for 12 weeks.  She has no clinical goiter, hence no need for thyroid ultrasound.  - I advised patient to maintain close follow up with Kim Helper, MD for primary care needs.     - Time spent on this patient care encounter:  25 minutes of which 50% was  spent in  counseling and the rest reviewing  her current and  previous labs / studies and medications  doses and developing a plan for long term care. Khaniyah Bezek Aoun  participated in the discussions, expressed understanding, and voiced agreement with the above plans.  All questions were answered to her satisfaction. she is encouraged to contact clinic should she have any questions or concerns prior to her return visit.  Follow up plan: Return in about 4 months (around 11/03/2019) for Follow up with Pre-visit Labs.  Glade Lloyd, MD Phone: [phone]  Fax: [phone]   07/06/2019, 3:24 PM

## 2019-07-26 ENCOUNTER — Ambulatory Visit: Payer: PPO | Attending: Internal Medicine

## 2019-07-26 DIAGNOSIS — Z20822 Contact with and (suspected) exposure to covid-19: Secondary | ICD-10-CM | POA: Diagnosis not present

## 2019-07-27 LAB — NOVEL CORONAVIRUS, NAA: SARS-CoV-2, NAA: NOT DETECTED

## 2019-10-03 ENCOUNTER — Telehealth: Payer: Self-pay | Admitting: Orthopedic Surgery

## 2019-10-03 NOTE — Telephone Encounter (Signed)
Spoke with patient; appointment scheduled; aware.

## 2019-10-03 NOTE — Telephone Encounter (Signed)
Patient left a voice message  09/30/19, 8:54am with no details - office was closed; call returned upon re-open today, 10/03/19. Left message to return call.

## 2019-10-11 ENCOUNTER — Encounter: Payer: Self-pay | Admitting: Orthopedic Surgery

## 2019-10-11 ENCOUNTER — Ambulatory Visit: Payer: PPO

## 2019-10-11 ENCOUNTER — Other Ambulatory Visit: Payer: Self-pay

## 2019-10-11 ENCOUNTER — Ambulatory Visit: Payer: PPO | Admitting: Orthopedic Surgery

## 2019-10-11 VITALS — BP 146/85 | HR 81 | Ht <= 58 in | Wt 143.0 lb

## 2019-10-11 DIAGNOSIS — M25522 Pain in left elbow: Secondary | ICD-10-CM | POA: Diagnosis not present

## 2019-10-11 DIAGNOSIS — G8929 Other chronic pain: Secondary | ICD-10-CM

## 2019-10-11 DIAGNOSIS — M7022 Olecranon bursitis, left elbow: Secondary | ICD-10-CM

## 2019-10-11 NOTE — Progress Notes (Signed)
Encounter Diagnosis  Name Primary?  . Chronic elbow pain, left Yes   69 year old female had left elbow bursectomy spur removal in January 2020 comes in today complaining of deformity of the left elbow and continued pain for several months  System review no numbness or tingling no radiation of the pain  Past Medical History:  Diagnosis Date  . Anxiety disorder   . Carotid artery stenosis   . Chronic pelvic pain in female   . Coronary artery disease   . Endometriosis   . GERD (gastroesophageal reflux disease)   . Helicobacter pylori gastritis    Dr. Olevia Perches   . Hiatal hernia 2003   on UGI   . Hypothyroidism 2009  . IBS (irritable bowel syndrome) 2003  . Obesity (BMI 30-39.9) DEC 2011 145 LBS  . Osteoporosis   . PONV (postoperative nausea and vomiting)     BP (!) 146/85   Pulse 81   Ht 4\' 10"  (1.473 m)   Wt 143 lb (64.9 kg)   BMI 29.89 kg/m   Left elbow surgical site healed but the arm is very sensitive over the elbow and olecranon the bursa swollen no fluid full range of motion ligaments stable no lymphadenopathy  X-rays show successful removal of the spur with swollen soft tissue consistent olecranon bursitis  Options were given for nonoperative treatment versus injection versus excision  Patient opted for injection  Left elbow injection.  Sterile technique was used to inject one-to-one mixture of Depo-Medrol 40 and lidocaine 1% it was well-tolerated without complication  We will arrange follow-up to see if that did the trick or if she would like to have the elbow operated on again

## 2019-10-11 NOTE — Patient Instructions (Addendum)
You have received an injection of steroids into the joint. 15% of patients will have increased pain within the 24 hours postinjection.   This is transient and will go away.   We recommend that you use ice packs on the injection site for 20 minutes every 2 hours and extra strength Tylenol 2 tablets every 8 as needed until the pain resolves.  If you continue to have pain after taking the Tylenol and using the ice please call the office for further instructions.    Elbow Bursitis Bursitis is swelling and pain at the tip of your elbow. This happens when fluid builds up in a sac under your skin (bursa). This may also be called olecranon bursitis. Follow these instructions at home: Medicines  Take over-the-counter and prescription medicines only as told by your doctor.  If you were prescribed an antibiotic, take it exactly as told by your doctor. Do not stop taking it even if you start to feel better. Managing pain, stiffness, and swelling   If told, put ice on your elbow: ? Put ice in a plastic bag. ? Place a towel between your skin and the bag. ? Leave the ice on for 20 minutes, 2-3 times a day.  If your bursitis is caused by an injury, follow instructions from your doctor about: ? Resting your elbow. ? Wearing a bandage.  Wear elbow pads or elbow wraps as needed. These help cushion your elbow. General instructions  Avoid any activities that cause elbow pain. Ask your doctor what activities are safe for you.  Keep all follow-up visits as told by your doctor. This is important. Contact a doctor if you have:  A fever.  Problems that do not get better with treatment.  Pain or swelling that: ? Gets worse. ? Goes away and then comes back.  Pus draining from your elbow. Get help right away if you have:  Trouble moving your arm, hand, or fingers. Summary  Bursitis is swelling and pain at the tip of the elbow.  You may need to take medicine or put ice on your  elbow.  Contact your doctor if your problems do not get better with treatment. This information is not intended to replace advice given to you by your health care provider. Make sure you discuss any questions you have with your health care provider. Document Revised: 05/29/2017 Document Reviewed: 05/26/2017 Elsevier Patient Education  2020 [employer].

## 2019-10-13 ENCOUNTER — Other Ambulatory Visit: Payer: Self-pay | Admitting: "Endocrinology

## 2019-10-19 ENCOUNTER — Telehealth: Payer: Self-pay | Admitting: Orthopedic Surgery

## 2019-10-19 NOTE — Telephone Encounter (Signed)
Called patient to notify that her ADA form has been completed as requested, and ready for pick up (no charge).

## 2019-10-26 ENCOUNTER — Other Ambulatory Visit: Payer: Self-pay

## 2019-10-26 ENCOUNTER — Encounter: Payer: Self-pay | Admitting: Orthopedic Surgery

## 2019-10-26 ENCOUNTER — Telehealth: Payer: Self-pay | Admitting: Orthopedic Surgery

## 2019-10-26 ENCOUNTER — Ambulatory Visit (INDEPENDENT_AMBULATORY_CARE_PROVIDER_SITE_OTHER): Payer: PPO | Admitting: Orthopedic Surgery

## 2019-10-26 VITALS — BP 115/74 | HR 78 | Temp 99.1°F | Ht <= 58 in | Wt 143.0 lb

## 2019-10-26 DIAGNOSIS — M7022 Olecranon bursitis, left elbow: Secondary | ICD-10-CM | POA: Diagnosis not present

## 2019-10-26 DIAGNOSIS — G8929 Other chronic pain: Secondary | ICD-10-CM

## 2019-10-26 DIAGNOSIS — M25522 Pain in left elbow: Secondary | ICD-10-CM

## 2019-10-26 NOTE — Progress Notes (Signed)
Status post left elbow bursectomy with recurrent bursitis status post recent injection, determine if surgery needed again.  Chief Complaint  Patient presents with  . Follow-up    Recheck on left elbow.    Stormy got an injection in her bursal sac went away.  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  Marland Kitchen Olecranon bursitis of left elbow Yes  . Chronic elbow pain, left

## 2019-10-26 NOTE — Telephone Encounter (Signed)
Fax of Accomodation form faxed per patient request with update per today's office visit with Dr Aline Brochure, to employer [employer]/attention [employer]ild[employer] / [employer]/Accomodation department, fax [phone]; patient aware.

## 2019-10-26 NOTE — Patient Instructions (Addendum)
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

## 2019-11-03 ENCOUNTER — Ambulatory Visit: Payer: PPO | Admitting: "Endocrinology

## 2019-11-04 ENCOUNTER — Other Ambulatory Visit: Payer: Self-pay | Admitting: "Endocrinology

## 2019-11-04 DIAGNOSIS — E039 Hypothyroidism, unspecified: Secondary | ICD-10-CM | POA: Diagnosis not present

## 2019-11-05 LAB — T4, FREE: Free T4: 1.84 ng/dL — ABNORMAL HIGH (ref 0.82–1.77)

## 2019-11-05 LAB — TSH: TSH: 0.114 u[IU]/mL — ABNORMAL LOW (ref 0.450–4.500)

## 2019-11-17 ENCOUNTER — Encounter: Payer: Self-pay | Admitting: "Endocrinology

## 2019-11-17 ENCOUNTER — Ambulatory Visit (INDEPENDENT_AMBULATORY_CARE_PROVIDER_SITE_OTHER): Payer: PPO | Admitting: "Endocrinology

## 2019-11-17 ENCOUNTER — Other Ambulatory Visit: Payer: Self-pay

## 2019-11-17 VITALS — BP 108/70 | HR 92 | Ht <= 58 in | Wt 147.0 lb

## 2019-11-17 DIAGNOSIS — E039 Hypothyroidism, unspecified: Secondary | ICD-10-CM | POA: Diagnosis not present

## 2019-11-17 DIAGNOSIS — E559 Vitamin D deficiency, unspecified: Secondary | ICD-10-CM

## 2019-11-17 MED ORDER — LEVOTHYROXINE SODIUM 88 MCG PO TABS: 88.0000 ug | ORAL_TABLET | Freq: Every day | ORAL | 1 refills | Status: DC

## 2019-11-17 NOTE — Progress Notes (Signed)
11/17/2019      Endocrinology follow-up note    Subjective:    Patient ID: Kim Grimes, female    DOB: [birth year], PCP Fayrene Helper, MD   Past Medical History:  Diagnosis Date  . Anxiety disorder   . Carotid artery stenosis   . Chronic pelvic pain in female   . Coronary artery disease   . Endometriosis   . GERD (gastroesophageal reflux disease)   . Helicobacter pylori gastritis    Dr. Olevia Perches   . Hiatal hernia 2003   on UGI   . Hypothyroidism 2009  . IBS (irritable bowel syndrome) 2003  . Obesity (BMI 30-39.9) DEC 2011 145 LBS  . Osteoporosis   . PONV (postoperative nausea and vomiting)    Past Surgical History:  Procedure Laterality Date  . ABDOMINAL HYSTERECTOMY    . adhesiolysis     dense adhesion between rectum and sigmoid,  & pelvis   . BRAVO [place] STUDY  01/19/2012   Procedure: BRAVO [place];  Surgeon: [name], MD;  [facility];  Service: Endoscopy;;  . CATARACT EXTRACTION W/PHACO Left 06/03/2019   Procedure: CATARACT EXTRACTION PHACO AND INTRAOCULAR LENS PLACEMENT LEFT EYE (CDE: 2.92);  Surgeon: [name], MD;  Location: [facility];  Service: Ophthalmology;  Laterality: Left;  . COLONOSCOPY  03: [facility]/D & 08: SCREENING   WNL'S  . COLONOSCOPY  07/28/2011   sessile polyp in ascending colon/internal hemorrhoids. Next colonoscopy January 2018  . COLONOSCOPY N/A 08/01/2016   Procedure: COLONOSCOPY;  Surgeon: [name], MD;  [facility];  Service: Endoscopy;  Laterality: N/A;  930   . ESOPHAGOGASTRODUODENOSCOPY  01/19/2012   SLF: SMALL Hiatal hernia/Mild gastritis  . EYE SURGERY Right 10/18/2013   catarat extraction  . LAPAROSCOPY    . OLECRANON BURSECTOMY Left 07/01/2018   Procedure: LEFT ELBOW OLECRANON BURSECTOMY AND SPUR EXCISION;  Surgeon: [name], MD;  Location: [facility];  Service: Orthopedics;  Laterality: Left;  . rt foot sx Bilateral   . s/p hysterectomy    . SALPINGOOPHORECTOMY    . UPPER  GASTROINTESTINAL ENDOSCOPY  DEC 2010: DYSPEPSIA/DYSPHAGIA   [place] RING/ESO DIL 16 MM, GASTRITIS/DUODENITIS 2o to Aleve/on OMP PRN   Social History   Socioeconomic History  . Marital status: Divorced    Spouse name: Not on file  . Number of children: Not on file  . Years of education: Not on file  . Highest education level: Not on file  Occupational History  . Occupation: fulltime at [employer]   Tobacco Use  . Smoking status: Never Smoker  . Smokeless tobacco: Never Used  . Tobacco comment: Never smoked  Substance and Sexual Activity  . Alcohol use: Yes    Alcohol/week: 0.0 standard drinks    Comment: occasional, maybe once/year  . Drug use: No  . Sexual activity: Not Currently    Birth control/protection: None  Other Topics Concern  . Not on file  Social History Narrative  . Not on file   Social Determinants of Health   Financial Resource Strain:   . Difficulty of Paying Living Expenses:   Food Insecurity:   . Worried About Charity fundraiser in the Last Year:   . Arboriculturist in the Last Year:   Transportation Needs:   . Film/video editor (Medical):   Marland Kitchen Lack of Transportation (Non-Medical):   Physical Activity:   . Days of Exercise per Week:   . Minutes  of Exercise per Session:   Stress:   . Feeling of Stress :   Social Connections:   . Frequency of Communication with Friends and Family:   . Frequency of Social Gatherings with Friends and Family:   . Attends Religious Services:   . Active Member of Clubs or Organizations:   . Attends Archivist Meetings:   Marland Kitchen Marital Status:    Outpatient Encounter Medications as of 11/17/2019  Medication Sig  . ergocalciferol (VITAMIN D2) 1.25 MG (50000 UT) capsule Take 1 capsule (50,000 Units total) by mouth once a week. One capsule once weekly  . fluticasone (FLONASE) 50 MCG/ACT nasal spray Place 2 sprays into both nostrils daily. (Patient taking differently: Place 2 sprays into both nostrils  daily as needed for allergies. )  . levothyroxine (SYNTHROID) 88 MCG tablet Take 1 tablet (88 mcg total) by mouth daily before breakfast.  . omeprazole (PRILOSEC) 20 MG capsule Take 1 capsule (20 mg total) by mouth daily before breakfast.  . [DISCONTINUED] levothyroxine (SYNTHROID) 100 MCG tablet TAKE 1 TABLET BY MOUTH DAILY BEFORE BREAKFAST.   No facility-administered encounter medications on file as of 11/17/2019.   ALLERGIES: Allergies  Allergen Reactions  . Aspirin Other (See Comments)    "blood rushing"  . Diazepam Other (See Comments)    Aggitation   VACCINATION STATUS: Immunization History  Administered Date(s) Administered  . Fluad Quad(high Dose 65+) 02/28/2019  . Influenza,inj,Quad PF,6+ Mos 04/13/2013, 05/09/2014, 03/23/2015, 02/16/2018  . Pneumococcal Conjugate-13 03/15/2018  . Pneumococcal Polysaccharide-23 03/22/2019  . Tdap 09/18/2011  . Zoster 09/18/2011    HPI   69 yr old female with medical hx as above. She is being seen in follow-up for hypothyroidism, vitamin D deficiency.    She has no new complaints.  She has a steady weight since last visit.  She is currently on levothyroxine 100 mcg p.o. daily before breakfast.  Her previsit labs are consistent with slight over replacement.     -She denies any palpitations, tremors, nor heat intolerance.   she denies goiter. she has family hx of hypothyroidism. she denies any exposure to neck radiation.   Review of Systems Limited as above.  Objective:    BP 108/70   Pulse 92   Ht 4\' 10"  (1.473 m)   Wt 147 lb (66.7 kg)   BMI 30.72 kg/m   Wt Readings from Last 3 Encounters:  11/17/19 147 lb (66.7 kg)  10/26/19 143 lb (64.9 kg)  10/11/19 143 lb (64.9 kg)        Recent Results (from the past 2160 hour(s))  T4, free     Status: Abnormal   Collection Time: 11/04/19 10:12 AM  Result Value Ref Range   Free T4 1.84 (H) 0.82 - 1.77 ng/dL  TSH     Status: Abnormal   Collection Time: 11/04/19 10:12 AM  Result  Value Ref Range   TSH 0.114 (L) 0.450 - 4.500 uIU/mL     Assessment & Plan:   1. hypothyroidism -Her recent thyroid function tests are consistent with over replacement.  I discussed and lowered her levothyroxine dose to 88 mcg p.o. daily before breakfast.    - We discussed about the correct intake of her thyroid hormone, on empty stomach at fasting, with water, separated by at least 30 minutes from breakfast and other medications,  and separated by more than 4 hours from calcium, iron, multivitamins, acid reflux medications (PPIs). -Patient is made aware of the fact that thyroid hormone replacement is  needed for life, dose to be adjusted by periodic monitoring of thyroid function tests.   2.  Vitamin D deficiency -Her most recent labs show vitamin D D level of 18.  She is encouraged to pick up her prescription for vitamin D2 50,000 units weekly for 12 weeks.  She has no clinical goiter, hence no need for thyroid ultrasound.  - I advised patient to maintain close follow up with Fayrene Helper, MD for primary care needs.      - Time spent on this patient care encounter:  25 minutes of which 50% was spent in  counseling and the rest reviewing  her current and  previous labs / studies and medications  doses and developing a plan for long term care. Jeriesha Meskimen Deskins  participated in the discussions, expressed understanding, and voiced agreement with the above plans.  All questions were answered to her satisfaction. she is encouraged to contact clinic should she have any questions or concerns prior to her return visit.   Follow up plan: Return in about 4 months (around 03/19/2020) for F/U with Pre-visit Labs.  Glade Lloyd, MD Phone: [phone]  Fax: [phone]   11/17/2019, 5:58 PM

## 2019-11-18 ENCOUNTER — Ambulatory Visit: Payer: PPO | Admitting: "Endocrinology

## 2019-11-24 DIAGNOSIS — H43813 Vitreous degeneration, bilateral: Secondary | ICD-10-CM | POA: Diagnosis not present

## 2019-11-24 DIAGNOSIS — H40023 Open angle with borderline findings, high risk, bilateral: Secondary | ICD-10-CM | POA: Diagnosis not present

## 2019-11-24 DIAGNOSIS — H26491 Other secondary cataract, right eye: Secondary | ICD-10-CM | POA: Diagnosis not present

## 2019-11-29 ENCOUNTER — Ambulatory Visit: Payer: PPO | Admitting: Family Medicine

## 2019-12-06 ENCOUNTER — Other Ambulatory Visit: Payer: Self-pay | Admitting: Family Medicine

## 2019-12-07 ENCOUNTER — Ambulatory Visit (INDEPENDENT_AMBULATORY_CARE_PROVIDER_SITE_OTHER): Payer: PPO | Admitting: Family Medicine

## 2019-12-07 ENCOUNTER — Encounter: Payer: Self-pay | Admitting: Family Medicine

## 2019-12-07 ENCOUNTER — Other Ambulatory Visit: Payer: Self-pay

## 2019-12-07 VITALS — BP 118/78 | HR 83 | Temp 97.2°F | Resp 16 | Ht <= 58 in | Wt 148.1 lb

## 2019-12-07 DIAGNOSIS — K219 Gastro-esophageal reflux disease without esophagitis: Secondary | ICD-10-CM | POA: Diagnosis not present

## 2019-12-07 DIAGNOSIS — R7989 Other specified abnormal findings of blood chemistry: Secondary | ICD-10-CM | POA: Diagnosis not present

## 2019-12-07 DIAGNOSIS — E039 Hypothyroidism, unspecified: Secondary | ICD-10-CM | POA: Diagnosis not present

## 2019-12-07 DIAGNOSIS — J3089 Other allergic rhinitis: Secondary | ICD-10-CM | POA: Diagnosis not present

## 2019-12-07 NOTE — Progress Notes (Signed)
   Kim Grimes     MRN: [id number]      DOB: May 31, 1951   HPI Kim Grimes is here for follow up and re-evaluation of chronic medical conditions, medication management and review of any available recent lab and radiology data.  Preventive health is updated, specifically  Cancer screening and Immunization.   Questions or concerns regarding consultations or procedures which the PT has had in the interim are  Addressed.Seeing Endo re thyroid and has ahd recent med adjustment to be reviewed 1 month uncontrolled allergy symptoms occasional cough , clear sputum, right ear prrsspressure and drainage, this also results in poor appetite reportedly    ROS Denies recent fever or chills. . Denies chest congestion, productive cough or wheezing. Denies chest pains, palpitations and leg swelling Denies abdominal pain, nausea, vomiting,diarrhea or constipation.   Denies dysuria, frequency, hesitancy or incontinence. Denies joint pain, swelling and limitation in mobility. Denies headaches, seizures, numbness, or tingling. Denies depression, anxiety or insomnia. Denies skin break down or rash.   PE  BP 118/78   Pulse 83   Temp (!) 97.2 F (36.2 C) (Temporal)   Resp 16   Ht 4\' 10"  (1.473 m)   Wt 148 lb 1.9 oz (67.2 kg)   SpO2 96%   BMI 30.96 kg/m   Patient alert and oriented and in no cardiopulmonary distress.  HEENT: No facial asymmetry, EOMI,     Neck supple . TM clear bilaterally, no sinus tenderness Chest: Clear to auscultation bilaterally.  CVS: S1, S2 no murmurs, no S3.Regular rate.  ABD: Soft non tender.   Ext: No edema  MS: Adequate ROM spine, shoulders, hips and knees.  Skin: Intact, no ulcerations or rash noted.  Psych: Good eye contact, normal affect. Memory intact not anxious or depressed appearing.  CNS: CN 2-12 intact, power,  normal throughout.no focal deficits noted.   Assessment & Plan  GERD (gastroesophageal reflux disease) Controlled, no change in  medication,followed by GI   Hypothyroidism Managed by Endo, medication dosage recently adjusted as under corrected  Allergic rhinitis Helping her current flare.  She is very educated with the importance of using prescription medication on a daily basis to alleviate symptoms.  High serum low-density lipoprotein (LDL) Hyperlipidemia:Low fat diet discussed and encouraged.   Lipid Panel  Lab Results  Component Value Date   CHOL 215 (H) 07/04/2019   HDL 68 07/04/2019   LDLCALC 133 (H) 07/04/2019   TRIG 80 07/04/2019   CHOLHDL 3.2 07/04/2019   Needs to reduce fried and fatty food intake as her lipid profile is above normal ,she understands and agrees with the plan. Hyperlipidemia:Low fat diet discussed and encouraged.

## 2019-12-07 NOTE — Patient Instructions (Addendum)
Wellness to be scheduled in 5 months, pt has an 8 pound weight loss goal  F/u with MD mid to end January, call if you need me sooner  Needs to call for fasting labs for January visit  Please commit to lowering fatty food intake  Continue daily exercise  Reduce Nabs and trail mix, and increase vegetables , and fruit , fresh or frozen  Please use flonase daily for your allergies  Thanks for choosing [facility], we consider it a privelige to serve you.

## 2019-12-11 ENCOUNTER — Encounter: Payer: Self-pay | Admitting: Family Medicine

## 2019-12-11 NOTE — Assessment & Plan Note (Signed)
Helping her current flare.  She is very educated with the importance of using prescription medication on a daily basis to alleviate symptoms.

## 2019-12-11 NOTE — Assessment & Plan Note (Addendum)
Hyperlipidemia:Low fat diet discussed and encouraged.   Lipid Panel  Lab Results  Component Value Date   CHOL 215 (H) 07/04/2019   HDL 68 07/04/2019   LDLCALC 133 (H) 07/04/2019   TRIG 80 07/04/2019   CHOLHDL 3.2 07/04/2019   Needs to reduce fried and fatty food intake as her lipid profile is above normal ,she understands and agrees with the plan. Hyperlipidemia:Low fat diet discussed and encouraged.

## 2019-12-11 NOTE — Assessment & Plan Note (Signed)
Controlled, no change in medication,followed by GI

## 2019-12-11 NOTE — Assessment & Plan Note (Signed)
Managed by Endo, medication dosage recently adjusted as under corrected

## 2020-02-03 DIAGNOSIS — Z1231 Encounter for screening mammogram for malignant neoplasm of breast: Secondary | ICD-10-CM | POA: Diagnosis not present

## 2020-02-07 DIAGNOSIS — N6002 Solitary cyst of left breast: Secondary | ICD-10-CM | POA: Diagnosis not present

## 2020-02-07 DIAGNOSIS — R928 Other abnormal and inconclusive findings on diagnostic imaging of breast: Secondary | ICD-10-CM | POA: Diagnosis not present

## 2020-02-21 ENCOUNTER — Encounter: Payer: Self-pay | Admitting: Family Medicine

## 2020-02-21 ENCOUNTER — Telehealth (INDEPENDENT_AMBULATORY_CARE_PROVIDER_SITE_OTHER): Payer: PPO | Admitting: Family Medicine

## 2020-02-21 ENCOUNTER — Other Ambulatory Visit: Payer: Self-pay

## 2020-02-21 VITALS — Ht 58.5 in | Wt 148.0 lb

## 2020-02-21 DIAGNOSIS — Z0000 Encounter for general adult medical examination without abnormal findings: Secondary | ICD-10-CM | POA: Diagnosis not present

## 2020-02-21 NOTE — Progress Notes (Signed)
Subjective:   Kim Grimes is a 69 y.o. female who presents for Medicare Annual (Subsequent) preventive examination.   Method of visit: Telephone  Location of Patient: Home Location of Provider: Office Consent was obtain for visit to be over via a telehealth platform. Services rendered by provider: Visit was performed via telephone/audio only  I verified that I am speaking with the correct person using two identifiers.  Review of Systems     Cardiac Risk Factors include: advanced age (>50men, >68 women);obesity (BMI >30kg/m2)     Objective:    Today's Vitals   02/21/20 0827  Weight: 148 lb (67.1 kg)  Height: 4' 10.5" (1.486 m)  PainSc: 0-No pain   Body mass index is 30.41 kg/m.  Advanced Directives 05/31/2019 07/01/2018 06/22/2018 02/15/2018 08/01/2016 05/24/2014 01/19/2012  Does Patient Have a Medical Advance Directive? No No No No No No Patient has advance directive, copy not in chart  Type of Advance Directive - - - - - - [place]  Does patient want to make changes to medical advance directive? - - - Yes (ED - Information included in AVS) - - -  Would patient like information on creating a medical advance directive? No - Patient declined No - Patient declined No - Patient declined - No - Patient declined No - patient declined information -  Pre-existing out of facility DNR order (yellow form or pink MOST form) - - - - - - No    Current Medications (verified) Outpatient Encounter Medications as of 02/21/2020  Medication Sig  . fluticasone (FLONASE) 50 MCG/ACT nasal spray Place 2 sprays into both nostrils daily. (Patient taking differently: Place 2 sprays into both nostrils daily as needed for allergies. )  . levothyroxine (SYNTHROID) 88 MCG tablet Take 1 tablet (88 mcg total) by mouth daily before breakfast.  . omeprazole (PRILOSEC) 20 MG capsule Take 1 capsule (20 mg total) by mouth daily before breakfast.  . Vitamin D, Ergocalciferol, (DRISDOL)  1.25 MG (50000 UNIT) CAPS capsule TAKE 1 CAPSULE (50,000 UNITS TOTAL) BY MOUTH ONCE A WEEK. ONE CAPSULE ONCE WEEKLY   No facility-administered encounter medications on file as of 02/21/2020.    Allergies (verified) Aspirin, Diazepam, and Prednisone   History: Past Medical History:  Diagnosis Date  . Anxiety disorder   . Carotid artery stenosis   . Chronic pelvic pain in female   . Coronary artery disease   . Endometriosis   . GERD (gastroesophageal reflux disease)   . Helicobacter pylori gastritis    Dr. Olevia Perches   . Hiatal hernia 2003   on UGI   . Hypothyroidism 2009  . IBS (irritable bowel syndrome) 2003  . Obesity (BMI 30-39.9) DEC 2011 145 LBS  . Osteoporosis   . PONV (postoperative nausea and vomiting)    Past Surgical History:  Procedure Laterality Date  . ABDOMINAL HYSTERECTOMY    . adhesiolysis     dense adhesion between rectum and sigmoid,  & pelvis   . BRAVO [place] STUDY  01/19/2012   Procedure: BRAVO [place];  Surgeon: [name], MD;  [facility];  Service: Endoscopy;;  . CATARACT EXTRACTION W/PHACO Left 06/03/2019   Procedure: CATARACT EXTRACTION PHACO AND INTRAOCULAR LENS PLACEMENT LEFT EYE (CDE: 2.92);  Surgeon: [name], MD;  Location: [facility];  Service: Ophthalmology;  Laterality: Left;  . COLONOSCOPY  03: AP/D & 08: SCREENING   WNL'S  . COLONOSCOPY  07/28/2011   sessile polyp in ascending colon/internal hemorrhoids.  Next colonoscopy January 2018  . COLONOSCOPY N/A 08/01/2016   Procedure: COLONOSCOPY;  Surgeon: [name], MD;  [facility];  Service: Endoscopy;  Laterality: N/A;  930   . ESOPHAGOGASTRODUODENOSCOPY  01/19/2012   SLF: SMALL Hiatal hernia/Mild gastritis  . EYE SURGERY Right 10/18/2013   catarat extraction  . LAPAROSCOPY    . OLECRANON BURSECTOMY Left 07/01/2018   Procedure: LEFT ELBOW OLECRANON BURSECTOMY AND SPUR EXCISION;  Surgeon: [name], MD;  Location: [facility];  Service: Orthopedics;  Laterality:  Left;  . rt foot sx Bilateral   . s/p hysterectomy    . SALPINGOOPHORECTOMY    . UPPER GASTROINTESTINAL ENDOSCOPY  DEC 2010: DYSPEPSIA/DYSPHAGIA   [place] RING/ESO DIL 16 MM, GASTRITIS/DUODENITIS 2o to Aleve/on OMP PRN   Family History  Problem Relation Age of Onset  . Prostate cancer Father   . Colon cancer Father        < 42 YO  . Cancer Father        prostate and colon  . Pancreatic cancer Mother   . Cancer Mother        pancreas  . Diabetes Mother   . Diabetes Brother   . Cancer Brother        prostate   . Diabetes Sister        prediabetic  . Heart disease Brother        CHF  . Colon polyps Neg Hx    Social History   Socioeconomic History  . Marital status: Divorced    Spouse name: Not on file  . Number of children: Not on file  . Years of education: Not on file  . Highest education level: Not on file  Occupational History  . Occupation: fulltime at [employer]   Tobacco Use  . Smoking status: Never Smoker  . Smokeless tobacco: Never Used  . Tobacco comment: Never smoked  Vaping Use  . Vaping Use: Never used  Substance and Sexual Activity  . Alcohol use: Yes    Alcohol/week: 0.0 standard drinks    Comment: occasional, maybe once/year  . Drug use: No  . Sexual activity: Not Currently    Birth control/protection: None  Other Topics Concern  . Not on file  Social History Narrative  . Not on file   Social Determinants of Health   Financial Resource Strain:   . Difficulty of Paying Living Expenses: Not on file  Food Insecurity:   . Worried About Charity fundraiser in the Last Year: Not on file  . Ran Out of Food in the Last Year: Not on file  Transportation Needs:   . Lack of Transportation (Medical): Not on file  . Lack of Transportation (Non-Medical): Not on file  Physical Activity:   . Days of Exercise per Week: Not on file  . Minutes of Exercise per Session: Not on file  Stress:   . Feeling of Stress : Not on file  Social Connections:    . Frequency of Communication with Friends and Family: Not on file  . Frequency of Social Gatherings with Friends and Family: Not on file  . Attends Religious Services: Not on file  . Active Member of Clubs or Organizations: Not on file  . Attends Archivist Meetings: Not on file  . Marital Status: Not on file    Tobacco Counseling Counseling given: Yes Comment: Never smoked   Clinical Intake:  Pre-visit preparation completed: Yes  Pain : No/denies pain  Pain Score: 0-No pain     BMI - recorded: 30.41 Nutritional Status: BMI > 30  Obese Nutritional Risks: None Diabetes: No  How often do you need to have someone help you when you read instructions, pamphlets, or other written materials from your doctor or pharmacy?: 1 - Never What is the last grade level you completed in school?: 12+ 2 years of college  Diabetic? no  Interpreter Needed?: No      Activities of Daily Living In your present state of health, do you have any difficulty performing the following activities: 02/21/2020 05/31/2019  Hearing? N N  Vision? N Y  Difficulty concentrating or making decisions? N N  Walking or climbing stairs? N N  Dressing or bathing? N N  Doing errands, shopping? N N  Preparing Food and eating ? N -  Using the Toilet? N -  In the past six months, have you accidently leaked urine? N -  Do you have problems with loss of bowel control? N -  Managing your Medications? N -  Managing your Finances? N -  Housekeeping or managing your Housekeeping? N -  Some recent data might be hidden    Patient Care Team: Fayrene Helper, MD as PCP - General (Family Medicine) [name], MD (Inactive) (Gastroenterology)  Indicate any recent Medical Services you may have received from other than [facility] providers in the past year (date may be approximate).     Assessment:   This is a routine wellness examination for [place].  Hearing/Vision screen No exam data  present  Dietary issues and exercise activities discussed: Current Exercise Habits: Home exercise routine, Type of exercise: walking, Time (Minutes): 20, Frequency (Times/Week): 7, Weekly Exercise (Minutes/Week): 140, Intensity: Moderate, Exercise limited by: None identified  Goals   None    Depression Screen PHQ 2/9 Scores 02/21/2020 06/09/2019 05/30/2019 04/27/2019 04/13/2019 02/17/2019 12/13/2018  PHQ - 2 Score 3 0 0 0 0 0 1  PHQ- 9 Score 4 - - - - - -    Fall Risk Fall Risk  02/21/2020 06/09/2019 05/30/2019 04/27/2019 04/13/2019  Falls in the past year? 0 1 0 0 0  Number falls in past yr: 0 0 0 0 0  Injury with Fall? 0 1 0 0 0  Follow up Falls evaluation completed;Education provided;Falls prevention discussed - - - -    Any stairs in or around the home? No  If so, are there any without handrails? No  Home free of loose throw rugs in walkways, pet beds, electrical cords, etc? No  Adequate lighting in your home to reduce risk of falls? Yes   ASSISTIVE DEVICES UTILIZED TO PREVENT FALLS:  Life alert? No  Use of a cane, walker or w/c? No  Grab bars in the bathroom? No  Shower chair or bench in shower? No  Elevated toilet seat or a handicapped toilet? Yes   TIMED UP AND GO:  Was the test performed? No .    Cognitive Function:     6CIT Screen 02/21/2020 02/17/2019 02/15/2018  What Year? - 0 points 0 points  What month? - 0 points 0 points  What time? 0 points 0 points 0 points  Count back from 20 0 points 0 points 0 points  Months in reverse 0 points 0 points 2 points  Repeat phrase 0 points 0 points 2 points  Total Score - 0 4    Immunizations Immunization History  Administered Date(s) Administered  . Fluad Quad(high Dose 65+) 02/28/2019  .  Influenza,inj,Quad PF,6+ Mos 04/13/2013, 05/09/2014, 03/23/2015, 02/16/2018  . Pneumococcal Conjugate-13 03/15/2018  . Pneumococcal Polysaccharide-23 03/22/2019  . Tdap 09/18/2011  . Zoster 09/18/2011    TDAP status: Up to  date Flu Vaccine status: Up to date Pneumococcal vaccine status: Up to date Covid-19 vaccine status: Completed vaccines  Qualifies for Shingles Vaccine? Yes   Zostavax completed Yes   Shingrix Completed?: No.    Education has been provided regarding the importance of this vaccine. Patient has been advised to call insurance company to determine out of pocket expense if they have not yet received this vaccine. Advised may also receive vaccine at local pharmacy or Health Dept. Verbalized acceptance and understanding.  Screening Tests Health Maintenance  Topic Date Due  . COVID-19 Vaccine (1) Never done  . INFLUENZA VACCINE  01/29/2020  . MAMMOGRAM  12/28/2020  . TETANUS/TDAP  09/17/2021  . COLONOSCOPY  08/01/2026  . DEXA SCAN  Completed  . Hepatitis C Screening  Completed  . PNA vac Low Risk Adult  Completed    Health Maintenance  Health Maintenance Due  Topic Date Due  . COVID-19 Vaccine (1) Never done  . INFLUENZA VACCINE  01/29/2020    Colorectal cancer screening: Completed [id number]. Repeat every 10 years Mammogram status: Completed 20100712. Repeat every year Bone Density status: Completed [id number]. Results reflect: Bone density results: OSTEOPENIA. Repeat every 2 years.  Lung Cancer Screening: (Low Dose CT Chest recommended if Age 21-80 years, 30 pack-year currently smoking OR have quit w/in 15years.) does not qualify.    Additional Screening:  Hepatitis C Screening: does not qualify; Completed 07/04/2019   Vision Screening: Recommended annual ophthalmology exams for early detection of glaucoma and other disorders of the eye. Is the patient up to date with their annual eye exam?  Yes  Who is the provider or what is the name of the office in which the patient attends annual eye exams? Dr.Davis If pt is not established with a provider, would they like to be referred to a provider to establish care? No .   Dental Screening: Recommended annual dental exams for proper oral  hygiene  Community Resource Referral / Chronic Care Management: CRR required this visit?  No   CCM required this visit?  No      Plan:      1. Encounter for Medicare annual wellness exam  I have personally reviewed and noted the following in the patient's chart:   . Medical and social history . Use of alcohol, tobacco or illicit drugs  . Current medications and supplements . Functional ability and status . Nutritional status . Physical activity . Advanced directives . List of other physicians . Hospitalizations, surgeries, and ER visits in previous 12 months . Vitals . Screenings to include cognitive, depression, and falls . Referrals and appointments  In addition, I have reviewed and discussed with patient certain preventive protocols, quality metrics, and best practice recommendations. A written personalized care plan for preventive services as well as general preventive health recommendations were provided to patient.     Perlie Mayo, NP   02/21/2020   I provided 20 minutes of non-face-to-face time during this encounter.

## 2020-02-21 NOTE — Patient Instructions (Addendum)
Kim Grimes , Thank you for taking time to come for your Medicare Wellness Visit. I appreciate your ongoing commitment to your health goals. Please review the following plan we discussed and let me know if I can assist you in the future.   Please continue to practice social distancing to keep you, your family, and our community safe.  If you must go out, please wear a Mask and practice good handwashing.  Screening recommendations/referrals: Colonoscopy: up to date Mammogram: up to date (make sure they send your information to Dr Moshe Cipro) Bone Density: up to date Recommended yearly ophthalmology/optometry visit for glaucoma screening and checkup Recommended yearly dental visit for hygiene and checkup  Vaccinations: Influenza vaccine: Due ( *would like to come in on Sept 14 for this) Pneumococcal vaccine: up to date Tdap vaccine: up to date Shingles vaccine: up todate  Advanced directives: daughter has paperwork, please make sure it includes health wishes  Conditions/risks identified: Falls   Next appointment: 07/25/2020  Preventive Care 5 Years and Older, Female Preventive care refers to lifestyle choices and visits with your health care provider that can promote health and wellness. What does preventive care include?  A yearly physical exam. This is also called an annual well check.  Dental exams once or twice a year.  Routine eye exams. Ask your health care provider how often you should have your eyes checked.  Personal lifestyle choices, including:  Daily care of your teeth and gums.  Regular physical activity.  Eating a healthy diet.  Avoiding tobacco and drug use.  Limiting alcohol use.  Practicing safe sex.  Taking low-dose aspirin every day.  Taking vitamin and mineral supplements as recommended by your health care provider. What happens during an annual well check? The services and screenings done by your health care provider during your annual well  check will depend on your age, overall health, lifestyle risk factors, and family history of disease. Counseling  Your health care provider may ask you questions about your:  Alcohol use.  Tobacco use.  Drug use.  Emotional well-being.  Home and relationship well-being.  Sexual activity.  Eating habits.  History of falls.  Memory and ability to understand (cognition).  Work and work Statistician.  Reproductive health. Screening  You may have the following tests or measurements:  Height, weight, and BMI.  Blood pressure.  Lipid and cholesterol levels. These may be checked every 5 years, or more frequently if you are over 75 years old.  Skin check.  Lung cancer screening. You may have this screening every year starting at age 55 if you have a 30-pack-year history of smoking and currently smoke or have quit within the past 15 years.  Fecal occult blood test (FOBT) of the stool. You may have this test every year starting at age 30.  Flexible sigmoidoscopy or colonoscopy. You may have a sigmoidoscopy every 5 years or a colonoscopy every 10 years starting at age 41.  Hepatitis C blood test.  Hepatitis B blood test.  Sexually transmitted disease (STD) testing.  Diabetes screening. This is done by checking your blood sugar (glucose) after you have not eaten for a while (fasting). You may have this done every 1-3 years.  Bone density scan. This is done to screen for osteoporosis. You may have this done starting at age 6.  Mammogram. This may be done every 1-2 years. Talk to your health care provider about how often you should have regular mammograms. Talk with your health care provider about  your test results, treatment options, and if necessary, the need for more tests. Vaccines  Your health care provider may recommend certain vaccines, such as:  Influenza vaccine. This is recommended every year.  Tetanus, diphtheria, and acellular pertussis (Tdap, Td) vaccine. You  may need a Td booster every 10 years.  Zoster vaccine. You may need this after age 59.  Pneumococcal 13-valent conjugate (PCV13) vaccine. One dose is recommended after age 66.  Pneumococcal polysaccharide (PPSV23) vaccine. One dose is recommended after age 32. Talk to your health care provider about which screenings and vaccines you need and how often you need them. This information is not intended to replace advice given to you by your health care provider. Make sure you discuss any questions you have with your health care provider. Document Released: 07/13/2015 Document Revised: 03/05/2016 Document Reviewed: 04/17/2015 Elsevier Interactive Patient Education  2017 [place] Prevention in the Home Falls can cause injuries. They can happen to people of all ages. There are many things you can do to make your home safe and to help prevent falls. What can I do on the outside of my home?  Regularly fix the edges of walkways and driveways and fix any cracks.  Remove anything that might make you trip as you walk through a door, such as a raised step or threshold.  Trim any bushes or trees on the path to your home.  Use bright outdoor lighting.  Clear any walking paths of anything that might make someone trip, such as rocks or tools.  Regularly check to see if handrails are loose or broken. Make sure that both sides of any steps have handrails.  Any raised decks and porches should have guardrails on the edges.  Have any leaves, snow, or ice cleared regularly.  Use sand or salt on walking paths during winter.  Clean up any spills in your garage right away. This includes oil or grease spills. What can I do in the bathroom?  Use night lights.  Install grab bars by the toilet and in the tub and shower. Do not use towel bars as grab bars.  Use non-skid mats or decals in the tub or shower.  If you need to sit down in the shower, use a plastic, non-slip stool.  Keep the floor  dry. Clean up any water that spills on the floor as soon as it happens.  Remove soap buildup in the tub or shower regularly.  Attach bath mats securely with double-sided non-slip rug tape.  Do not have throw rugs and other things on the floor that can make you trip. What can I do in the bedroom?  Use night lights.  Make sure that you have a light by your bed that is easy to reach.  Do not use any sheets or blankets that are too big for your bed. They should not hang down onto the floor.  Have a firm chair that has side arms. You can use this for support while you get dressed.  Do not have throw rugs and other things on the floor that can make you trip. What can I do in the kitchen?  Clean up any spills right away.  Avoid walking on wet floors.  Keep items that you use a lot in easy-to-reach places.  If you need to reach something above you, use a strong step stool that has a grab bar.  Keep electrical cords out of the way.  Do not use floor polish or wax  that makes floors slippery. If you must use wax, use non-skid floor wax.  Do not have throw rugs and other things on the floor that can make you trip. What can I do with my stairs?  Do not leave any items on the stairs.  Make sure that there are handrails on both sides of the stairs and use them. Fix handrails that are broken or loose. Make sure that handrails are as long as the stairways.  Check any carpeting to make sure that it is firmly attached to the stairs. Fix any carpet that is loose or worn.  Avoid having throw rugs at the top or bottom of the stairs. If you do have throw rugs, attach them to the floor with carpet tape.  Make sure that you have a light switch at the top of the stairs and the bottom of the stairs. If you do not have them, ask someone to add them for you. What else can I do to help prevent falls?  Wear shoes that:  Do not have high heels.  Have rubber bottoms.  Are comfortable and fit you  well.  Are closed at the toe. Do not wear sandals.  If you use a stepladder:  Make sure that it is fully opened. Do not climb a closed stepladder.  Make sure that both sides of the stepladder are locked into place.  Ask someone to hold it for you, if possible.  Clearly mark and make sure that you can see:  Any grab bars or handrails.  First and last steps.  Where the edge of each step is.  Use tools that help you move around (mobility aids) if they are needed. These include:  Canes.  Walkers.  Scooters.  Crutches.  Turn on the lights when you go into a dark area. Replace any light bulbs as soon as they burn out.  Set up your furniture so you have a clear path. Avoid moving your furniture around.  If any of your floors are uneven, fix them.  If there are any pets around you, be aware of where they are.  Review your medicines with your doctor. Some medicines can make you feel dizzy. This can increase your chance of falling. Ask your doctor what other things that you can do to help prevent falls. This information is not intended to replace advice given to you by your health care provider. Make sure you discuss any questions you have with your health care provider. Document Released: 04/12/2009 Document Revised: 11/22/2015 Document Reviewed: 07/21/2014 Elsevier Interactive Patient Education  2017 [employer].

## 2020-03-13 ENCOUNTER — Other Ambulatory Visit: Payer: Self-pay

## 2020-03-13 ENCOUNTER — Ambulatory Visit (INDEPENDENT_AMBULATORY_CARE_PROVIDER_SITE_OTHER): Payer: PPO | Admitting: *Deleted

## 2020-03-13 DIAGNOSIS — Z23 Encounter for immunization: Secondary | ICD-10-CM

## 2020-03-22 ENCOUNTER — Ambulatory Visit: Payer: PPO | Admitting: Nurse Practitioner

## 2020-03-29 ENCOUNTER — Telehealth: Payer: Self-pay

## 2020-03-29 NOTE — Telephone Encounter (Signed)
Called lvm to call her back

## 2020-04-02 ENCOUNTER — Other Ambulatory Visit: Payer: Self-pay

## 2020-04-02 ENCOUNTER — Other Ambulatory Visit: Payer: Self-pay | Admitting: *Deleted

## 2020-04-02 ENCOUNTER — Telehealth: Payer: Self-pay | Admitting: *Deleted

## 2020-04-02 ENCOUNTER — Telehealth: Payer: Self-pay | Admitting: Family Medicine

## 2020-04-02 DIAGNOSIS — E039 Hypothyroidism, unspecified: Secondary | ICD-10-CM

## 2020-04-02 NOTE — Telephone Encounter (Signed)
Patient wanting to switch from dr nida to [place] and is needing a new referral in order to schedule phone is [phone]

## 2020-04-02 NOTE — Telephone Encounter (Signed)
Error

## 2020-04-02 NOTE — Telephone Encounter (Signed)
Referral has been entered by Dr.Patel

## 2020-04-10 ENCOUNTER — Encounter: Payer: Self-pay | Admitting: Endocrinology

## 2020-04-10 ENCOUNTER — Ambulatory Visit: Payer: PPO | Admitting: Endocrinology

## 2020-04-10 ENCOUNTER — Other Ambulatory Visit: Payer: Self-pay

## 2020-04-10 VITALS — BP 138/58 | HR 73 | Ht 58.5 in | Wt 149.0 lb

## 2020-04-10 DIAGNOSIS — E039 Hypothyroidism, unspecified: Secondary | ICD-10-CM

## 2020-04-10 LAB — TSH: TSH: 4.01 u[IU]/mL (ref 0.35–4.50)

## 2020-04-10 LAB — T4, FREE: Free T4: 0.91 ng/dL (ref 0.60–1.60)

## 2020-04-10 NOTE — Patient Instructions (Addendum)
Blood tests are requested for you today.  We'll let you know about the results.   Our goal will be to minimize the number of medication changes. It is best to never miss the medication.  However, if you do miss it, next best is to double up the next time. Please come back for a follow-up appointment in 3 months.

## 2020-04-10 NOTE — Progress Notes (Signed)
Subjective:    Patient ID: Kim Grimes, female    DOB: 03-26-1951, 69 y.o.   MRN: [id number]  HPI Pt is referred by Dr Posey Pronto, for hypothyroidism.  Pt reports hypothyroidism was dx'ed in 2011.  she has been on prescribed thyroid hormone therapy since dx.  she has never taken kelp or any other type of non-prescribed thyroid product.  she has never had thyroid imaging.  She has never had thyroid surgery, or XRT to the neck.  she has never been on amiodarone or lithium.  Main symptoms are fatigue and weight gain.  Over the past few years, synthroid dosage has varied from 75-125 mcg/d.  TFT have varied, even with minimal changes in dosage.  She has been on the current 88 mcg/d since 5/21.  Pt says she sometimes misses the synthroid.   Past Medical History:  Diagnosis Date  . Anxiety disorder   . Carotid artery stenosis   . Chronic pelvic pain in female   . Coronary artery disease   . Endometriosis   . GERD (gastroesophageal reflux disease)   . Helicobacter pylori gastritis    Dr. Olevia Perches   . Hiatal hernia 2003   on UGI   . Hypothyroidism 2009  . IBS (irritable bowel syndrome) 2003  . Obesity (BMI 30-39.9) DEC 2011 145 LBS  . Osteoporosis   . PONV (postoperative nausea and vomiting)     Past Surgical History:  Procedure Laterality Date  . ABDOMINAL HYSTERECTOMY    . adhesiolysis     dense adhesion between rectum and sigmoid,  & pelvis   . BRAVO [place] STUDY  01/19/2012   Procedure: BRAVO [place];  Surgeon: [name], MD;  [facility];  Service: Endoscopy;;  . CATARACT EXTRACTION W/PHACO Left 06/03/2019   Procedure: CATARACT EXTRACTION PHACO AND INTRAOCULAR LENS PLACEMENT LEFT EYE (CDE: 2.92);  Surgeon: [name], MD;  Location: [facility];  Service: Ophthalmology;  Laterality: Left;  . COLONOSCOPY  03: AP/D & 08: SCREENING   WNL'S  . COLONOSCOPY  07/28/2011   sessile polyp in ascending colon/internal hemorrhoids. Next colonoscopy January 2018  . COLONOSCOPY N/A  08/01/2016   Procedure: COLONOSCOPY;  Surgeon: [name], MD;  [facility];  Service: Endoscopy;  Laterality: N/A;  930   . ESOPHAGOGASTRODUODENOSCOPY  01/19/2012   SLF: SMALL Hiatal hernia/Mild gastritis  . EYE SURGERY Right 10/18/2013   catarat extraction  . LAPAROSCOPY    . OLECRANON BURSECTOMY Left 07/01/2018   Procedure: LEFT ELBOW OLECRANON BURSECTOMY AND SPUR EXCISION;  Surgeon: [name], MD;  Location: [facility];  Service: Orthopedics;  Laterality: Left;  . rt foot sx Bilateral   . s/p hysterectomy    . SALPINGOOPHORECTOMY    . UPPER GASTROINTESTINAL ENDOSCOPY  DEC 2010: DYSPEPSIA/DYSPHAGIA   [place] RING/ESO DIL 16 MM, GASTRITIS/DUODENITIS 2o to Aleve/on OMP PRN    Social History   Socioeconomic History  . Marital status: Divorced    Spouse name: Not on file  . Number of children: Not on file  . Years of education: Not on file  . Highest education level: Not on file  Occupational History  . Occupation: fulltime at [employer]   Tobacco Use  . Smoking status: Never Smoker  . Smokeless tobacco: Never Used  . Tobacco comment: Never smoked  Vaping Use  . Vaping Use: Never used  Substance and Sexual Activity  . Alcohol use: Yes    Alcohol/week: 0.0 standard drinks  Comment: occasional, maybe once/year  . Drug use: No  . Sexual activity: Not Currently    Birth control/protection: None  Other Topics Concern  . Not on file  Social History Narrative  . Not on file   Social Determinants of Health   Financial Resource Strain:   . Difficulty of Paying Living Expenses: Not on file  Food Insecurity:   . Worried About Charity fundraiser in the Last Year: Not on file  . Ran Out of Food in the Last Year: Not on file  Transportation Needs:   . Lack of Transportation (Medical): Not on file  . Lack of Transportation (Non-Medical): Not on file  Physical Activity:   . Days of Exercise per Week: Not on file  . Minutes of Exercise per Session:  Not on file  Stress:   . Feeling of Stress : Not on file  Social Connections:   . Frequency of Communication with Friends and Family: Not on file  . Frequency of Social Gatherings with Friends and Family: Not on file  . Attends Religious Services: Not on file  . Active Member of Clubs or Organizations: Not on file  . Attends Archivist Meetings: Not on file  . Marital Status: Not on file  Intimate Partner Violence:   . Fear of Current or Ex-Partner: Not on file  . Emotionally Abused: Not on file  . Physically Abused: Not on file  . Sexually Abused: Not on file    Current Outpatient Medications on File Prior to Visit  Medication Sig Dispense Refill  . fluticasone (FLONASE) 50 MCG/ACT nasal spray Place 2 sprays into both nostrils daily. (Patient taking differently: Place 2 sprays into both nostrils daily as needed for allergies. ) 16 g 6  . levothyroxine (SYNTHROID) 88 MCG tablet Take 1 tablet (88 mcg total) by mouth daily before breakfast. 90 tablet 1  . omeprazole (PRILOSEC) 20 MG capsule Take 1 capsule (20 mg total) by mouth daily before breakfast. 90 capsule 1  . Vitamin D, Ergocalciferol, (DRISDOL) 1.25 MG (50000 UNIT) CAPS capsule TAKE 1 CAPSULE (50,000 UNITS TOTAL) BY MOUTH ONCE A WEEK. ONE CAPSULE ONCE WEEKLY 12 capsule 1   No current facility-administered medications on file prior to visit.    Allergies  Allergen Reactions  . Aspirin Other (See Comments)    "blood rushing"  . Diazepam Other (See Comments)    Aggitation  . Prednisone Other (See Comments)    Any type of steroids. Pt states she has seen what they do to other people and she just doesn't take them    Family History  Problem Relation Age of Onset  . Prostate cancer Father   . Colon cancer Father        < 30 YO  . Cancer Father        prostate and colon  . Pancreatic cancer Mother   . Cancer Mother        pancreas  . Diabetes Mother   . Diabetes Brother   . Cancer Brother        prostate    . Diabetes Sister        prediabetic  . Heart disease Brother        CHF  . Colon polyps Neg Hx   . Thyroid disease Neg Hx     BP (!) 138/58 (BP Location: Left Arm)   Pulse 73   Ht 4' 10.5" (1.486 m)   Wt 149 lb (67.6 kg)  SpO2 98%   BMI 30.61 kg/m     Review of Systems denies depression, muscle cramps, memory loss, constipation, numbness, cold intolerance, and dry skin.     Objective:   Physical Exam VS: see vs page GEN: no distress HEAD: head: no deformity eyes: no periorbital swelling, no proptosis external nose and ears are normal NECK: supple, thyroid is not enlarged CHEST WALL: no deformity LUNGS: clear to auscultation CV: reg rate and rhythm, no murmur.  MUSCULOSKELETAL: muscle bulk and strength are grossly normal.  no obvious joint swelling.  gait is normal and steady EXTEMITIES: no deformity.  no leg edema NEURO:  cn 2-12 grossly intact.   readily moves all 4's.  sensation is intact to touch on all 4's SKIN:  Normal texture and temperature.  No rash or suspicious lesion is visible.   NODES:  None palpable at the neck PSYCH: alert, well-oriented.  Does not appear anxious nor depressed.   I have reviewed outside records, and summarized: Pt was noted to have elevated TSH, and referred here.  She was noted to have varying TSH, even with little or no dosage change  Lab Results  Component Value Date   TSH 4.01 04/10/2020      Assessment & Plan:  Hypothyroidism: well-replaced.  Please continue the same synthroid. We discussed need to minimize medication changes  Patient Instructions  Blood tests are requested for you today.  We'll let you know about the results.   Our goal will be to minimize the number of medication changes. It is best to never miss the medication.  However, if you do miss it, next best is to double up the next time. Please come back for a follow-up appointment in 3 months.

## 2020-05-02 ENCOUNTER — Encounter: Payer: Self-pay | Admitting: Orthopedic Surgery

## 2020-05-03 ENCOUNTER — Encounter: Payer: Self-pay | Admitting: Orthopedic Surgery

## 2020-05-03 ENCOUNTER — Other Ambulatory Visit: Payer: Self-pay

## 2020-05-03 ENCOUNTER — Ambulatory Visit: Payer: PPO | Admitting: Orthopedic Surgery

## 2020-05-03 VITALS — BP 120/80 | HR 75 | Ht 58.5 in | Wt 149.0 lb

## 2020-05-03 DIAGNOSIS — M7022 Olecranon bursitis, left elbow: Secondary | ICD-10-CM | POA: Diagnosis not present

## 2020-05-03 DIAGNOSIS — G8929 Other chronic pain: Secondary | ICD-10-CM | POA: Diagnosis not present

## 2020-05-03 NOTE — Progress Notes (Signed)
Chief Complaint  Patient presents with  . Elbow Pain    left    Kim Grimes 69 she had olecranon bursitis she had surgery she complains of hypersensitivity over the tip of the olecranon and left elbow  Pain is non-radiating but severe although intermittent   Past Medical History:  Diagnosis Date  . Anxiety disorder   . Carotid artery stenosis   . Chronic pelvic pain in female   . Coronary artery disease   . Endometriosis   . GERD (gastroesophageal reflux disease)   . Helicobacter pylori gastritis    Dr. Olevia Perches   . Hiatal hernia 2003   on UGI   . Hypothyroidism 2009  . IBS (irritable bowel syndrome) 2003  . Obesity (BMI 30-39.9) DEC 2011 145 LBS  . Osteoporosis   . PONV (postoperative nausea and vomiting)    Past Surgical History:  Procedure Laterality Date  . ABDOMINAL HYSTERECTOMY    . adhesiolysis     dense adhesion between rectum and sigmoid,  & pelvis   . BRAVO [place] STUDY  01/19/2012   Procedure: BRAVO [place];  Surgeon: [name], MD;  [facility];  Service: Endoscopy;;  . CATARACT EXTRACTION W/PHACO Left 06/03/2019   Procedure: CATARACT EXTRACTION PHACO AND INTRAOCULAR LENS PLACEMENT LEFT EYE (CDE: 2.92);  Surgeon: [name], MD;  Location: [facility];  Service: Ophthalmology;  Laterality: Left;  . COLONOSCOPY  03: AP/D & 08: SCREENING   WNL'S  . COLONOSCOPY  07/28/2011   sessile polyp in ascending colon/internal hemorrhoids. Next colonoscopy January 2018  . COLONOSCOPY N/A 08/01/2016   Procedure: COLONOSCOPY;  Surgeon: [name], MD;  [facility];  Service: Endoscopy;  Laterality: N/A;  930   . ESOPHAGOGASTRODUODENOSCOPY  01/19/2012   SLF: SMALL Hiatal hernia/Mild gastritis  . EYE SURGERY Right 10/18/2013   catarat extraction  . LAPAROSCOPY    . OLECRANON BURSECTOMY Left 07/01/2018   Procedure: LEFT ELBOW OLECRANON BURSECTOMY AND SPUR EXCISION;  Surgeon: [name], MD;  Location: [facility];  Service: Orthopedics;  Laterality: Left;   . rt foot sx Bilateral   . s/p hysterectomy    . SALPINGOOPHORECTOMY    . UPPER GASTROINTESTINAL ENDOSCOPY  DEC 2010: DYSPEPSIA/DYSPHAGIA   [place] RING/ESO DIL 16 MM, GASTRITIS/DUODENITIS 2o to Aleve/on OMP PRN   Allergies  Allergen Reactions  . Aspirin Other (See Comments)    "blood rushing"  . Diazepam Other (See Comments)    Aggitation  . Prednisone Other (See Comments)    Any type of steroids. Pt states she has seen what they do to other people and she just doesn't take them   BP 120/80   Pulse 75   Ht 4' 10.5" (1.486 m)   Wt 149 lb (67.6 kg)   BMI 30.61 kg/m  Physical Exam Vitals and nursing note reviewed.  Constitutional:      Appearance: Normal appearance.  Neurological:     Mental Status: She is alert and oriented to person, place, and time.  Psychiatric:        Mood and Affect: Mood normal.     Exam reveals tenderness over the tip of the olecranon and lateral olecranon between the olecranon and the lateral epicondyle no tenderness over the lateral epicondyle itself there appears to be a palpable structure which rolls over the fingertip and seems to be causing pain  Recommend injection  She agrees  Verbal consent was obtained site was confirmed left elbow injection of Depo-Medrol 41% lidocaine  2 to 3 cc was injected split between the 2 areas through 1 injection portal  No complications  Follow-up as needed  Encounter Diagnoses  Name Primary?  Marland Kitchen Olecranon bursitis of left elbow s/p bursectomy 07/01/18 Yes  . Chronic elbow pain, left    Chronic greater than a yr injection

## 2020-05-15 ENCOUNTER — Other Ambulatory Visit: Payer: Self-pay | Admitting: "Endocrinology

## 2020-05-21 DIAGNOSIS — H04123 Dry eye syndrome of bilateral lacrimal glands: Secondary | ICD-10-CM | POA: Diagnosis not present

## 2020-05-21 DIAGNOSIS — H1045 Other chronic allergic conjunctivitis: Secondary | ICD-10-CM | POA: Diagnosis not present

## 2020-05-21 DIAGNOSIS — H5213 Myopia, bilateral: Secondary | ICD-10-CM | POA: Diagnosis not present

## 2020-05-21 DIAGNOSIS — H43813 Vitreous degeneration, bilateral: Secondary | ICD-10-CM | POA: Diagnosis not present

## 2020-05-21 DIAGNOSIS — H3540 Unspecified peripheral retinal degeneration: Secondary | ICD-10-CM | POA: Diagnosis not present

## 2020-05-21 DIAGNOSIS — H16223 Keratoconjunctivitis sicca, not specified as Sjogren's, bilateral: Secondary | ICD-10-CM | POA: Diagnosis not present

## 2020-05-21 DIAGNOSIS — Z961 Presence of intraocular lens: Secondary | ICD-10-CM | POA: Diagnosis not present

## 2020-05-21 DIAGNOSIS — H26491 Other secondary cataract, right eye: Secondary | ICD-10-CM | POA: Diagnosis not present

## 2020-05-21 DIAGNOSIS — H40013 Open angle with borderline findings, low risk, bilateral: Secondary | ICD-10-CM | POA: Diagnosis not present

## 2020-05-21 DIAGNOSIS — H524 Presbyopia: Secondary | ICD-10-CM | POA: Diagnosis not present

## 2020-05-21 DIAGNOSIS — H52223 Regular astigmatism, bilateral: Secondary | ICD-10-CM | POA: Diagnosis not present

## 2020-05-26 ENCOUNTER — Other Ambulatory Visit: Payer: Self-pay | Admitting: Family Medicine

## 2020-06-07 DIAGNOSIS — H43813 Vitreous degeneration, bilateral: Secondary | ICD-10-CM | POA: Diagnosis not present

## 2020-06-07 DIAGNOSIS — H01005 Unspecified blepharitis left lower eyelid: Secondary | ICD-10-CM | POA: Diagnosis not present

## 2020-06-07 DIAGNOSIS — Z961 Presence of intraocular lens: Secondary | ICD-10-CM | POA: Diagnosis not present

## 2020-06-07 DIAGNOSIS — H01004 Unspecified blepharitis left upper eyelid: Secondary | ICD-10-CM | POA: Diagnosis not present

## 2020-06-07 DIAGNOSIS — H40023 Open angle with borderline findings, high risk, bilateral: Secondary | ICD-10-CM | POA: Diagnosis not present

## 2020-06-07 DIAGNOSIS — H01001 Unspecified blepharitis right upper eyelid: Secondary | ICD-10-CM | POA: Diagnosis not present

## 2020-06-07 DIAGNOSIS — H01002 Unspecified blepharitis right lower eyelid: Secondary | ICD-10-CM | POA: Diagnosis not present

## 2020-06-13 ENCOUNTER — Other Ambulatory Visit: Payer: Self-pay | Admitting: Family Medicine

## 2020-07-10 ENCOUNTER — Other Ambulatory Visit: Payer: Self-pay

## 2020-07-12 ENCOUNTER — Ambulatory Visit: Payer: PPO | Admitting: Endocrinology

## 2020-07-12 ENCOUNTER — Encounter: Payer: Self-pay | Admitting: Endocrinology

## 2020-07-12 ENCOUNTER — Other Ambulatory Visit: Payer: Self-pay

## 2020-07-12 VITALS — BP 124/86 | HR 73 | Ht <= 58 in | Wt 151.0 lb

## 2020-07-12 DIAGNOSIS — E039 Hypothyroidism, unspecified: Secondary | ICD-10-CM

## 2020-07-12 LAB — T4, FREE: Free T4: 1.3 ng/dL (ref 0.60–1.60)

## 2020-07-12 LAB — TSH: TSH: 1.95 u[IU]/mL (ref 0.35–4.50)

## 2020-07-12 NOTE — Progress Notes (Signed)
Subjective:    Patient ID: Kim Grimes, female    DOB: 1950/12/02, 70 y.o.   MRN: [id number]  HPI Pt returns for f/u of hypothyroidism (dx'ed 2011; she has been on prescribed thyroid hormone therapy since dx; she has never had thyroid imaging; synthroid dosage has varied from 75-125 mcg/d; TFT have varied, even with minimal changes in dosage).  pt states she feels well in general, except for fatigue.   Past Medical History:  Diagnosis Date  . Anxiety disorder   . Carotid artery stenosis   . Chronic pelvic pain in female   . Coronary artery disease   . Endometriosis   . GERD (gastroesophageal reflux disease)   . Helicobacter pylori gastritis    Dr. Olevia Perches   . Hiatal hernia 2003   on UGI   . Hypothyroidism 2009  . IBS (irritable bowel syndrome) 2003  . Obesity (BMI 30-39.9) DEC 2011 145 LBS  . Osteoporosis   . PONV (postoperative nausea and vomiting)     Past Surgical History:  Procedure Laterality Date  . ABDOMINAL HYSTERECTOMY    . adhesiolysis     dense adhesion between rectum and sigmoid,  & pelvis   . BRAVO [place] STUDY  01/19/2012   Procedure: BRAVO [place];  Surgeon: [name], MD;  [facility];  Service: Endoscopy;;  . CATARACT EXTRACTION W/PHACO Left 06/03/2019   Procedure: CATARACT EXTRACTION PHACO AND INTRAOCULAR LENS PLACEMENT LEFT EYE (CDE: 2.92);  Surgeon: [name], MD;  Location: [facility];  Service: Ophthalmology;  Laterality: Left;  . COLONOSCOPY  03: AP/D & 08: SCREENING   WNL'S  . COLONOSCOPY  07/28/2011   sessile polyp in ascending colon/internal hemorrhoids. Next colonoscopy January 2018  . COLONOSCOPY N/A 08/01/2016   Procedure: COLONOSCOPY;  Surgeon: [name], MD;  [facility];  Service: Endoscopy;  Laterality: N/A;  930   . ESOPHAGOGASTRODUODENOSCOPY  01/19/2012   SLF: SMALL Hiatal hernia/Mild gastritis  . EYE SURGERY Right 10/18/2013   catarat extraction  . LAPAROSCOPY    . OLECRANON BURSECTOMY Left 07/01/2018    Procedure: LEFT ELBOW OLECRANON BURSECTOMY AND SPUR EXCISION;  Surgeon: [name], MD;  Location: [facility];  Service: Orthopedics;  Laterality: Left;  . rt foot sx Bilateral   . s/p hysterectomy    . SALPINGOOPHORECTOMY    . UPPER GASTROINTESTINAL ENDOSCOPY  DEC 2010: DYSPEPSIA/DYSPHAGIA   [place] RING/ESO DIL 16 MM, GASTRITIS/DUODENITIS 2o to Aleve/on OMP PRN    Social History   Socioeconomic History  . Marital status: Divorced    Spouse name: Not on file  . Number of children: Not on file  . Years of education: Not on file  . Highest education level: Not on file  Occupational History  . Occupation: fulltime at [employer]   Tobacco Use  . Smoking status: Never Smoker  . Smokeless tobacco: Never Used  . Tobacco comment: Never smoked  Vaping Use  . Vaping Use: Never used  Substance and Sexual Activity  . Alcohol use: Yes    Alcohol/week: 0.0 standard drinks    Comment: occasional, maybe once/year  . Drug use: No  . Sexual activity: Not Currently    Birth control/protection: None  Other Topics Concern  . Not on file  Social History Narrative  . Not on file   Social Determinants of Health   Financial Resource Strain: Not on file  Food Insecurity: Not on file  Transportation Needs: Not on file  Physical Activity: Not on file  Stress: Not on file  Social Connections: Not on file  Intimate Partner Violence: Not on file    Current Outpatient Medications on File Prior to Visit  Medication Sig Dispense Refill  . fluticasone (FLONASE) 50 MCG/ACT nasal spray Place 2 sprays into both nostrils daily. (Patient taking differently: Place 2 sprays into both nostrils daily as needed for allergies.) 16 g 6  . omeprazole (PRILOSEC) 20 MG capsule TAKE 1 CAPSULE (20 MG TOTAL) BY MOUTH DAILY BEFORE BREAKFAST. 90 capsule 1  . Vitamin D, Ergocalciferol, (DRISDOL) 1.25 MG (50000 UNIT) CAPS capsule TAKE 1 CAPSULE EVERY WEEK 12 capsule 1   No current facility-administered  medications on file prior to visit.    Allergies  Allergen Reactions  . Aspirin Other (See Comments)    "blood rushing"  . Diazepam Other (See Comments)    Aggitation  . Prednisone Other (See Comments)    Any type of steroids. Pt states she has seen what they do to other people and she just doesn't take them    Family History  Problem Relation Age of Onset  . Prostate cancer Father   . Colon cancer Father        < 70 YO  . Cancer Father        prostate and colon  . Pancreatic cancer Mother   . Cancer Mother        pancreas  . Diabetes Mother   . Diabetes Brother   . Cancer Brother        prostate   . Diabetes Sister        prediabetic  . Heart disease Brother        CHF  . Colon polyps Neg Hx   . Thyroid disease Neg Hx     BP 124/86   Pulse 73   Ht 4\' 10"  (1.473 m)   Wt 151 lb (68.5 kg)   SpO2 99%   BMI 31.56 kg/m    Review of Systems     Objective:   Physical Exam VITAL SIGNS:  See vs page GENERAL: no distress NECK: There is no palpable thyroid enlargement.  No thyroid nodule is palpable.  No palpable lymphadenopathy at the anterior neck.  Lab Results  Component Value Date   TSH 1.95 07/12/2020       Assessment & Plan:  Hypothyroidism: well-replaced.  Please continue the same synthroid.

## 2020-07-12 NOTE — Patient Instructions (Signed)
Blood tests are requested for you today.  We'll let you know about the results.   Our goal will be to minimize the number of medication changes. It is best to never miss the medication.  However, if you do miss it, next best is to double up the next time. Please come back for a follow-up appointment in 3 months.   

## 2020-07-14 MED ORDER — LEVOTHYROXINE SODIUM 88 MCG PO TABS: 88.0000 ug | ORAL_TABLET | Freq: Every day | ORAL | 3 refills | Status: DC

## 2020-07-25 ENCOUNTER — Ambulatory Visit (INDEPENDENT_AMBULATORY_CARE_PROVIDER_SITE_OTHER): Payer: PPO | Admitting: Nurse Practitioner

## 2020-07-25 ENCOUNTER — Encounter: Payer: Self-pay | Admitting: Nurse Practitioner

## 2020-07-25 ENCOUNTER — Ambulatory Visit: Payer: PPO | Admitting: Family Medicine

## 2020-07-25 ENCOUNTER — Other Ambulatory Visit: Payer: Self-pay

## 2020-07-25 DIAGNOSIS — R7302 Impaired glucose tolerance (oral): Secondary | ICD-10-CM

## 2020-07-25 DIAGNOSIS — E039 Hypothyroidism, unspecified: Secondary | ICD-10-CM

## 2020-07-25 DIAGNOSIS — E559 Vitamin D deficiency, unspecified: Secondary | ICD-10-CM | POA: Diagnosis not present

## 2020-07-25 DIAGNOSIS — R7989 Other specified abnormal findings of blood chemistry: Secondary | ICD-10-CM

## 2020-07-25 NOTE — Assessment & Plan Note (Signed)
-  will check A1c

## 2020-07-25 NOTE — Patient Instructions (Addendum)
We will need fasting labs to complete this visit. If you have eaten today, please have your labs drawn after fasting.  For shingles vaccine, the practice administrator quoted the price at $395, so you will be much better getting it for $45 at the pharmacy.

## 2020-07-25 NOTE — Assessment & Plan Note (Signed)
-  checking lipid panel today 

## 2020-07-25 NOTE — Assessment & Plan Note (Signed)
-  checking vit D today

## 2020-07-25 NOTE — Assessment & Plan Note (Signed)
-  followed by endocrinology 

## 2020-07-25 NOTE — Progress Notes (Signed)
Established Patient Office Visit  Subjective:  Patient ID: Kim Grimes, female    DOB: 1950-11-14  Age: 70 y.o. MRN: [id number]  CC:  Chief Complaint  Patient presents with  . Follow-up    HPI Kim Grimes presents for lab follow-up. She states she had recent lab draw with her endocrinologist in [place] for her thyroid labs. No acute concerns.  Past Medical History:  Diagnosis Date  . Anxiety disorder   . Carotid artery stenosis   . Chronic pelvic pain in female   . Coronary artery disease   . Endometriosis   . GERD (gastroesophageal reflux disease)   . Helicobacter pylori gastritis    Dr. Olevia Perches   . Hiatal hernia 2003   on UGI   . Hypothyroidism 2009  . IBS (irritable bowel syndrome) 2003  . Obesity (BMI 30-39.9) DEC 2011 145 LBS  . Osteoporosis   . PONV (postoperative nausea and vomiting)     Past Surgical History:  Procedure Laterality Date  . ABDOMINAL HYSTERECTOMY    . adhesiolysis     dense adhesion between rectum and sigmoid,  & pelvis   . BRAVO [place] STUDY  01/19/2012   Procedure: BRAVO [place];  Surgeon: [name], MD;  [facility];  Service: Endoscopy;;  . CATARACT EXTRACTION W/PHACO Left 06/03/2019   Procedure: CATARACT EXTRACTION PHACO AND INTRAOCULAR LENS PLACEMENT LEFT EYE (CDE: 2.92);  Surgeon: [name], MD;  Location[facility];  Service: Ophthalmology;  Laterality: Left;  . COLONOSCOPY  03: AP/D & 08: SCREENING   WNL'S  . COLONOSCOPY  07/28/2011   sessile polyp in ascending colon/internal hemorrhoids. Next colonoscopy January 2018  . COLONOSCOPY N/A 08/01/2016   Procedure: COLONOSCOPY;  Surgeon: [name], MD;  [facility];  Service: Endoscopy;  Laterality: N/A;  930   . ESOPHAGOGASTRODUODENOSCOPY  01/19/2012   SLF: SMALL Hiatal hernia/Mild gastritis  . EYE SURGERY Right 10/18/2013   catarat extraction  . LAPAROSCOPY    . OLECRANON BURSECTOMY Left 07/01/2018   Procedure: LEFT ELBOW OLECRANON BURSECTOMY  AND SPUR EXCISION;  Surgeon: [name], MD;  Location: [facility];  Service: Orthopedics;  Laterality: Left;  . rt foot sx Bilateral   . s/p hysterectomy    . SALPINGOOPHORECTOMY    . UPPER GASTROINTESTINAL ENDOSCOPY  DEC 2010: DYSPEPSIA/DYSPHAGIA   [place] RING/ESO DIL 16 MM, GASTRITIS/DUODENITIS 2o to Aleve/on OMP PRN    Family History  Problem Relation Age of Onset  . Prostate cancer Father   . Colon cancer Father        < 39 YO  . Cancer Father        prostate and colon  . Pancreatic cancer Mother   . Cancer Mother        pancreas  . Diabetes Mother   . Diabetes Brother   . Cancer Brother        prostate   . Diabetes Sister        prediabetic  . Heart disease Brother        CHF  . Colon polyps Neg Hx   . Thyroid disease Neg Hx     Social History   Socioeconomic History  . Marital status: Divorced    Spouse name: Not on file  . Number of children: Not on file  . Years of education: Not on file  . Highest education level: Not on file  Occupational History  . Occupation: fulltime at [employer]  Tobacco Use  . Smoking status: Never Smoker  . Smokeless tobacco: Never Used  . Tobacco comment: Never smoked  Vaping Use  . Vaping Use: Never used  Substance and Sexual Activity  . Alcohol use: Yes    Alcohol/week: 0.0 standard drinks    Comment: occasional, maybe once/year  . Drug use: No  . Sexual activity: Not Currently    Birth control/protection: None  Other Topics Concern  . Not on file  Social History Narrative  . Not on file   Social Determinants of Health   Financial Resource Strain: Not on file  Food Insecurity: Not on file  Transportation Needs: Not on file  Physical Activity: Not on file  Stress: Not on file  Social Connections: Not on file  Intimate Partner Violence: Not on file    Outpatient Medications Prior to Visit  Medication Sig Dispense Refill  . fluticasone (FLONASE) 50 MCG/ACT nasal spray Place 2 sprays into both  nostrils daily. (Patient taking differently: Place 2 sprays into both nostrils daily as needed for allergies.) 16 g 6  . levothyroxine (SYNTHROID) 88 MCG tablet Take 1 tablet (88 mcg total) by mouth daily before breakfast. 90 tablet 3  . omeprazole (PRILOSEC) 20 MG capsule TAKE 1 CAPSULE (20 MG TOTAL) BY MOUTH DAILY BEFORE BREAKFAST. 90 capsule 1  . Vitamin D, Ergocalciferol, (DRISDOL) 1.25 MG (50000 UNIT) CAPS capsule TAKE 1 CAPSULE EVERY WEEK 12 capsule 1   No facility-administered medications prior to visit.    Allergies  Allergen Reactions  . Aspirin Other (See Comments)    "blood rushing"  . Diazepam Other (See Comments)    Aggitation  . Prednisone Other (See Comments)    Any type of steroids. Pt states she has seen what they do to other people and she just doesn't take them    ROS Review of Systems  Constitutional: Negative.   Respiratory: Negative.   Cardiovascular: Negative.   Musculoskeletal:       Elbow pain; followed by Dr. Aline Brochure      Objective:    Physical Exam Constitutional:      Appearance: Normal appearance.  Cardiovascular:     Rate and Rhythm: Normal rate and regular rhythm.     Pulses: Normal pulses.     Heart sounds: Normal heart sounds.  Pulmonary:     Effort: Pulmonary effort is normal.     Breath sounds: Normal breath sounds.  Musculoskeletal:        General: Normal range of motion.  Neurological:     Mental Status: She is alert.     BP (!) 149/88 (BP Location: Right Arm, Patient Position: Sitting, Cuff Size: Normal)   Pulse 78   Temp 98.5 F (36.9 C) (Temporal)   Ht '4\' 10"'  (1.473 m)   Wt 150 lb (68 kg)   SpO2 99%   BMI 31.35 kg/m  Wt Readings from Last 3 Encounters:  07/25/20 150 lb (68 kg)  07/12/20 151 lb (68.5 kg)  05/03/20 149 lb (67.6 kg)     There are no preventive care reminders to display for this patient.  There are no preventive care reminders to display for this patient.  Lab Results  Component Value Date   TSH  1.95 07/12/2020   Lab Results  Component Value Date   WBC 5.2 07/04/2019   HGB 13.7 07/04/2019   HCT 41.3 07/04/2019   MCV 88 07/04/2019   PLT 244 07/04/2019   Lab Results  Component Value Date  NA 141 07/04/2019   K 4.4 07/04/2019   CO2 24 07/04/2019   GLUCOSE 84 07/04/2019   BUN 17 07/04/2019   CREATININE 0.84 07/04/2019   BILITOT 0.3 07/04/2019   ALKPHOS 93 07/04/2019   AST 17 07/04/2019   ALT 13 07/04/2019   PROT 6.6 07/04/2019   ALBUMIN 4.3 07/04/2019   CALCIUM 9.7 07/04/2019   ANIONGAP 5 06/22/2018   Lab Results  Component Value Date   CHOL 215 (H) 07/04/2019   Lab Results  Component Value Date   HDL 68 07/04/2019   Lab Results  Component Value Date   LDLCALC 133 (H) 07/04/2019   Lab Results  Component Value Date   TRIG 80 07/04/2019   Lab Results  Component Value Date   CHOLHDL 3.2 07/04/2019   Lab Results  Component Value Date   HGBA1C 5.4 05/09/2014      Assessment & Plan:   Problem List Items Addressed This Visit      Endocrine   Hypothyroidism    -followed by endocrinology      IGT (impaired glucose tolerance)    -will check A1c       Relevant Orders   Hemoglobin A1c     Other   Vitamin D deficiency    -checking vit D today      Relevant Orders   Vitamin D (25 hydroxy)   High serum low-density lipoprotein (LDL)    -checking lipid panel today      Relevant Orders   CBC with Differential/Platelet   CMP14+EGFR   Lipid Panel With LDL/HDL Ratio      No orders of the defined types were placed in this encounter.   Follow-up: Return in about 6 months (around 01/22/2021) for physical with Jarrett Soho.    Noreene Larsson, NP

## 2020-07-31 ENCOUNTER — Telehealth: Payer: Self-pay

## 2020-07-31 DIAGNOSIS — E559 Vitamin D deficiency, unspecified: Secondary | ICD-10-CM | POA: Diagnosis not present

## 2020-07-31 DIAGNOSIS — R7989 Other specified abnormal findings of blood chemistry: Secondary | ICD-10-CM | POA: Diagnosis not present

## 2020-07-31 DIAGNOSIS — R7302 Impaired glucose tolerance (oral): Secondary | ICD-10-CM | POA: Diagnosis not present

## 2020-07-31 NOTE — Telephone Encounter (Signed)
Patient came in asking does she need to get the Pneumonia shot?

## 2020-07-31 NOTE — Telephone Encounter (Signed)
Its not in the caregaps. Would she be due for one?

## 2020-07-31 NOTE — Telephone Encounter (Signed)
Pt aware.

## 2020-07-31 NOTE — Telephone Encounter (Signed)
She is up to date on pneumonia, she has had them both and is over 21, recent one was in 2020, please explain , no pmneumonia vaccine is due

## 2020-08-01 LAB — CBC WITH DIFFERENTIAL/PLATELET
Basophils Absolute: 0 10*3/uL (ref 0.0–0.2)
Basos: 1 %
EOS (ABSOLUTE): 0.2 10*3/uL (ref 0.0–0.4)
Eos: 3 %
Hematocrit: 41.6 % (ref 34.0–46.6)
Hemoglobin: 13.6 g/dL (ref 11.1–15.9)
Immature Grans (Abs): 0 10*3/uL (ref 0.0–0.1)
Immature Granulocytes: 0 %
Lymphocytes Absolute: 1.8 10*3/uL (ref 0.7–3.1)
Lymphs: 40 %
MCH: 28.6 pg (ref 26.6–33.0)
MCHC: 32.7 g/dL (ref 31.5–35.7)
MCV: 88 fL (ref 79–97)
Monocytes Absolute: 0.4 10*3/uL (ref 0.1–0.9)
Monocytes: 9 %
Neutrophils Absolute: 2.1 10*3/uL (ref 1.4–7.0)
Neutrophils: 47 %
Platelets: 266 10*3/uL (ref 150–450)
RBC: 4.75 x10E6/uL (ref 3.77–5.28)
RDW: 12.6 % (ref 11.7–15.4)
WBC: 4.5 10*3/uL (ref 3.4–10.8)

## 2020-08-01 LAB — CMP14+EGFR
ALT: 14 IU/L (ref 0–32)
AST: 20 IU/L (ref 0–40)
Albumin/Globulin Ratio: 1.7 (ref 1.2–2.2)
Albumin: 4 g/dL (ref 3.8–4.8)
Alkaline Phosphatase: 79 IU/L (ref 44–121)
BUN/Creatinine Ratio: 13 (ref 12–28)
BUN: 11 mg/dL (ref 8–27)
Bilirubin Total: 0.3 mg/dL (ref 0.0–1.2)
CO2: 25 mmol/L (ref 20–29)
Calcium: 9.2 mg/dL (ref 8.7–10.3)
Chloride: 103 mmol/L (ref 96–106)
Creatinine, Ser: 0.86 mg/dL (ref 0.57–1.00)
GFR calc Af Amer: 80 mL/min/{1.73_m2} (ref >59)
GFR calc non Af Amer: 69 mL/min/{1.73_m2} (ref >59)
Globulin, Total: 2.3 g/dL (ref 1.5–4.5)
Glucose: 86 mg/dL (ref 65–99)
Potassium: 4.1 mmol/L (ref 3.5–5.2)
Sodium: 140 mmol/L (ref 134–144)
Total Protein: 6.3 g/dL (ref 6.0–8.5)

## 2020-08-01 LAB — LIPID PANEL WITH LDL/HDL RATIO
Cholesterol, Total: 192 mg/dL (ref 100–199)
HDL: 56 mg/dL (ref >39)
LDL Chol Calc (NIH): 120 mg/dL — ABNORMAL HIGH (ref 0–99)
LDL/HDL Ratio: 2.1 ratio (ref 0.0–3.2)
Triglycerides: 89 mg/dL (ref 0–149)
VLDL Cholesterol Cal: 16 mg/dL (ref 5–40)

## 2020-08-01 LAB — HEMOGLOBIN A1C
Est. average glucose Bld gHb Est-mCnc: 111 mg/dL
Hgb A1c MFr Bld: 5.5 % (ref 4.8–5.6)

## 2020-08-01 LAB — VITAMIN D 25 HYDROXY (VIT D DEFICIENCY, FRACTURES): Vit D, 25-Hydroxy: 60.9 ng/mL (ref 30.0–100.0)

## 2020-08-01 NOTE — Progress Notes (Signed)
Her labs look great except for her LDL, or bad cholesterol. However, it has improved since her last set of labs. Her LDL is 120, and her last LDL is 133. Since she is still improving I am ok with not changing/adding medications unless she really wants that.

## 2020-09-05 ENCOUNTER — Ambulatory Visit: Payer: PPO | Admitting: Gastroenterology

## 2020-09-05 DIAGNOSIS — B373 Candidiasis of vulva and vagina: Secondary | ICD-10-CM | POA: Diagnosis not present

## 2020-09-05 DIAGNOSIS — M810 Age-related osteoporosis without current pathological fracture: Secondary | ICD-10-CM | POA: Diagnosis not present

## 2020-09-05 DIAGNOSIS — Z90722 Acquired absence of ovaries, bilateral: Secondary | ICD-10-CM | POA: Diagnosis not present

## 2020-09-05 DIAGNOSIS — Z9079 Acquired absence of other genital organ(s): Secondary | ICD-10-CM | POA: Diagnosis not present

## 2020-09-05 DIAGNOSIS — Z01419 Encounter for gynecological examination (general) (routine) without abnormal findings: Secondary | ICD-10-CM | POA: Diagnosis not present

## 2020-09-05 DIAGNOSIS — Z124 Encounter for screening for malignant neoplasm of cervix: Secondary | ICD-10-CM | POA: Diagnosis not present

## 2020-09-05 DIAGNOSIS — Z90710 Acquired absence of both cervix and uterus: Secondary | ICD-10-CM | POA: Diagnosis not present

## 2020-09-14 ENCOUNTER — Telehealth: Payer: Self-pay | Admitting: Orthopaedic Surgery

## 2020-09-14 NOTE — Telephone Encounter (Signed)
Patient called and requested an appointment with either Dr. Aline Brochure or Dr. Luna Glasgow.  She stated that her knee had "popped out".  She said she tried to put it back in but she didn't think it went all the way back in.  She said it was swollen.  I told her that neither of the doctors were in the office today.   I then suggested to her that she go to the Urgent Care or the ED.  She said she didn't want to go to either place.  I told her the first available appointment we had would be with Dr. Luna Glasgow on Tuesday morning at 8:00.   She said this was fine.  She will be here Tuesday morning.

## 2020-09-18 ENCOUNTER — Ambulatory Visit (INDEPENDENT_AMBULATORY_CARE_PROVIDER_SITE_OTHER): Payer: PPO | Admitting: Orthopaedic Surgery

## 2020-09-18 ENCOUNTER — Encounter: Payer: Self-pay | Admitting: Orthopaedic Surgery

## 2020-09-18 ENCOUNTER — Ambulatory Visit: Payer: PPO

## 2020-09-18 VITALS — BP 144/84 | HR 73 | Ht <= 58 in | Wt 148.0 lb

## 2020-09-18 DIAGNOSIS — G8929 Other chronic pain: Secondary | ICD-10-CM

## 2020-09-18 DIAGNOSIS — M25562 Pain in left knee: Secondary | ICD-10-CM | POA: Diagnosis not present

## 2020-09-18 NOTE — Progress Notes (Signed)
[name], female DOB:March 26, 1951, 70 y.o. JAS:[id number]  Chief Complaint  Patient presents with  . Knee Pain    Left since on treadmill for 1 and a half hours last week     HPI  Kim Grimes is a 70 y.o. female who got on a treadmill last week for one and a half hours.  She then had left knee pain medially  It has not gotten better.  It has swollen.  It pops and she has giving way.  She cannot get comfortable.  She limps.  She has taken Tylenol but cannot take any NSAIDs.  She has used ice and heat with no help.   Body mass index is 33.18 kg/m.  ROS  Review of Systems  Constitutional: Positive for activity change.  Musculoskeletal: Positive for arthralgias, gait problem and joint swelling.  All other systems reviewed and are negative.   All other systems reviewed and are negative.  The following is a summary of the past history medically, past history surgically, known current medicines, social history and family history.  This information is gathered electronically by the computer from prior information and documentation.  I review this each visit and have found including this information at this point in the chart is beneficial and informative.    Past Medical History:  Diagnosis Date  . Anxiety disorder   . Carotid artery stenosis   . Chronic pelvic pain in female   . Coronary artery disease   . Endometriosis   . GERD (gastroesophageal reflux disease)   . Helicobacter pylori gastritis    Dr. Olevia Perches   . Hiatal hernia 2003   on UGI   . Hypothyroidism 2009  . IBS (irritable bowel syndrome) 2003  . Obesity (BMI 30-39.9) DEC 2011 145 LBS  . Osteoporosis   . PONV (postoperative nausea and vomiting)     Past Surgical History:  Procedure Laterality Date  . ABDOMINAL HYSTERECTOMY    . adhesiolysis     dense adhesion between rectum and sigmoid,  & pelvis   . BRAVO [place] STUDY  01/19/2012   Procedure: BRAVO [place];  Surgeon: [name], MD;   [facility];  Service: Endoscopy;;  . CATARACT EXTRACTION W/PHACO Left 06/03/2019   Procedure: CATARACT EXTRACTION PHACO AND INTRAOCULAR LENS PLACEMENT LEFT EYE (CDE: 2.92);  Surgeon: [name], MD;  Location: [facility];  Service: Ophthalmology;  Laterality: Left;  . COLONOSCOPY  03: AP/D & 08: SCREENING   WNL'S  . COLONOSCOPY  07/28/2011   sessile polyp in ascending colon/internal hemorrhoids. Next colonoscopy January 2018  . COLONOSCOPY N/A 08/01/2016   Procedure: COLONOSCOPY;  Surgeon: [name], MD;  [facility];  Service: Endoscopy;  Laterality: N/A;  930   . ESOPHAGOGASTRODUODENOSCOPY  01/19/2012   SLF: SMALL Hiatal hernia/Mild gastritis  . EYE SURGERY Right 10/18/2013   catarat extraction  . LAPAROSCOPY    . OLECRANON BURSECTOMY Left 07/01/2018   Procedure: LEFT ELBOW OLECRANON BURSECTOMY AND SPUR EXCISION;  Surgeon: [name], MD;  Location: [facility];  Service: Orthopedics;  Laterality: Left;  . rt foot sx Bilateral   . s/p hysterectomy    . SALPINGOOPHORECTOMY    . UPPER GASTROINTESTINAL ENDOSCOPY  DEC 2010: DYSPEPSIA/DYSPHAGIA   [place] RING/ESO DIL 16 MM, GASTRITIS/DUODENITIS 2o to Aleve/on OMP PRN    Family History  Problem Relation Age of Onset  . Prostate cancer Father   . Colon cancer Father        <  69 YO  . Cancer Father        prostate and colon  . Pancreatic cancer Mother   . Cancer Mother        pancreas  . Diabetes Mother   . Diabetes Brother   . Cancer Brother        prostate   . Diabetes Sister        prediabetic  . Heart disease Brother        CHF  . Colon polyps Neg Hx   . Thyroid disease Neg Hx     Social History Social History   Tobacco Use  . Smoking status: Never Smoker  . Smokeless tobacco: Never Used  . Tobacco comment: Never smoked  Vaping Use  . Vaping Use: Never used  Substance Use Topics  . Alcohol use: Yes    Alcohol/week: 0.0 standard drinks    Comment: occasional, maybe once/year  . Drug use:  No    Allergies  Allergen Reactions  . Aspirin Other (See Comments)    "blood rushing"  . Diazepam Other (See Comments)    Aggitation  . Prednisone Other (See Comments)    Any type of steroids. Pt states she has seen what they do to other people and she just doesn't take them    Current Outpatient Medications  Medication Sig Dispense Refill  . fluticasone (FLONASE) 50 MCG/ACT nasal spray Place 2 sprays into both nostrils daily. (Patient taking differently: Place 2 sprays into both nostrils daily as needed for allergies.) 16 g 6  . levothyroxine (SYNTHROID) 88 MCG tablet Take 1 tablet (88 mcg total) by mouth daily before breakfast. 90 tablet 3  . omeprazole (PRILOSEC) 20 MG capsule TAKE 1 CAPSULE (20 MG TOTAL) BY MOUTH DAILY BEFORE BREAKFAST. 90 capsule 1  . Vitamin D, Ergocalciferol, (DRISDOL) 1.25 MG (50000 UNIT) CAPS capsule TAKE 1 CAPSULE EVERY WEEK 12 capsule 1   No current facility-administered medications for this visit.     Physical Exam  Blood pressure (!) 144/84, pulse 73, height 4\' 8"  (1.422 m), weight 148 lb (67.1 kg).  Constitutional: overall normal hygiene, normal nutrition, well developed, normal grooming, normal body habitus. Assistive device:none  Musculoskeletal: gait and station Limp left, muscle tone and strength are normal, no tremors or atrophy is present.  .  Neurological: coordination overall normal.  Deep tendon reflex/nerve stretch intact.  Sensation normal.  Cranial nerves II-XII intact.   Skin:   Normal overall no scars, lesions, ulcers or rashes. No psoriasis.  Psychiatric: Alert and oriented x 3.  Recent memory intact, remote memory unclear.  Normal mood and affect. Well groomed.  Good eye contact.  Cardiovascular: overall no swelling, no varicosities, no edema bilaterally, normal temperatures of the legs and arms, no clubbing, cyanosis and good capillary refill.  Lymphatic: palpation is normal.  Left knee is tender medially, ROM 0 to 105, limp  left, crepitus positive medial [place], [place] intact.  All other systems reviewed and are negative   The patient has been educated about the nature of the problem(s) and counseled on treatment options.  The patient appeared to understand what I have discussed and is in agreement with it.  Encounter Diagnosis  Name Primary?  . Chronic pain of left knee Yes   PROCEDURE NOTE:  The patient requests injections of the left knee , verbal consent was obtained.  The left knee was prepped appropriately after time out was performed.   Sterile technique was observed and injection of 1 cc of  Celestone 6 mg with several cc's of plain xylocaine. Anesthesia was provided by ethyl chloride and a 20-gauge needle was used to inject the knee area. The injection was tolerated well.  A band aid dressing was applied.  The patient was advised to apply ice later today and tomorrow to the injection sight as needed.  I am concerned about meniscus tear.  She may need MRI.  PLAN Call if any problems.  Precautions discussed.  Continue current medications.   Return to clinic 2 weeks   Electronically Signed Sanjuana Kava, MD 3/22/20228:50 AM

## 2020-09-27 ENCOUNTER — Other Ambulatory Visit: Payer: Self-pay | Admitting: Obstetrics and Gynecology

## 2020-09-27 ENCOUNTER — Other Ambulatory Visit: Payer: Self-pay | Admitting: Specialist

## 2020-09-27 DIAGNOSIS — M810 Age-related osteoporosis without current pathological fracture: Secondary | ICD-10-CM

## 2020-09-27 DIAGNOSIS — Z1231 Encounter for screening mammogram for malignant neoplasm of breast: Secondary | ICD-10-CM

## 2020-10-02 ENCOUNTER — Encounter: Payer: Self-pay | Admitting: Orthopaedic Surgery

## 2020-10-02 ENCOUNTER — Other Ambulatory Visit: Payer: Self-pay

## 2020-10-02 ENCOUNTER — Ambulatory Visit: Payer: PPO | Admitting: Orthopaedic Surgery

## 2020-10-02 VITALS — BP 152/96 | HR 79 | Ht <= 58 in | Wt 148.0 lb

## 2020-10-02 DIAGNOSIS — G8929 Other chronic pain: Secondary | ICD-10-CM

## 2020-10-02 DIAGNOSIS — M25562 Pain in left knee: Secondary | ICD-10-CM

## 2020-10-02 NOTE — Progress Notes (Signed)
Her left knee is worse.  She has giving way now.  I think she has medial meniscus tear left knee.  I will get MRI.  She may need arthroscopy.  PROCEDURE NOTE:  The patient requests injections of the left knee , verbal consent was obtained.  The left knee was prepped appropriately after time out was performed.   Sterile technique was observed and injection of 1 cc of Celestone 6 mg with several cc's of plain xylocaine. Anesthesia was provided by ethyl chloride and a 20-gauge needle was used to inject the knee area. The injection was tolerated well.  A band aid dressing was applied.  The patient was advised to apply ice later today and tomorrow to the injection sight as needed.  Return in two weeks.  Get MRI left knee.  Electronically Signed Sanjuana Kava, MD 4/5/20229:09 AM

## 2020-10-05 ENCOUNTER — Ambulatory Visit (HOSPITAL_COMMUNITY)
Admission: RE | Admit: 2020-10-05 | Discharge: 2020-10-05 | Disposition: A | Discharge status: Home / Self Care | Payer: PPO | Source: Ambulatory Visit | Attending: Orthopaedic Surgery | Admitting: Orthopaedic Surgery

## 2020-10-05 DIAGNOSIS — M25562 Pain in left knee: Secondary | ICD-10-CM | POA: Diagnosis not present

## 2020-10-05 DIAGNOSIS — M1712 Unilateral primary osteoarthritis, left knee: Secondary | ICD-10-CM | POA: Diagnosis not present

## 2020-10-05 DIAGNOSIS — M25462 Effusion, left knee: Secondary | ICD-10-CM | POA: Diagnosis not present

## 2020-10-05 DIAGNOSIS — G8929 Other chronic pain: Secondary | ICD-10-CM | POA: Insufficient documentation

## 2020-10-05 DIAGNOSIS — S83242A Other tear of medial meniscus, current injury, left knee, initial encounter: Secondary | ICD-10-CM | POA: Diagnosis not present

## 2020-10-09 ENCOUNTER — Ambulatory Visit: Payer: PPO | Admitting: Orthopaedic Surgery

## 2020-10-09 ENCOUNTER — Encounter: Payer: Self-pay | Admitting: Orthopaedic Surgery

## 2020-10-09 ENCOUNTER — Other Ambulatory Visit: Payer: Self-pay

## 2020-10-09 VITALS — BP 141/85 | HR 83 | Ht <= 58 in | Wt 145.0 lb

## 2020-10-09 DIAGNOSIS — M25562 Pain in left knee: Secondary | ICD-10-CM

## 2020-10-09 DIAGNOSIS — G8929 Other chronic pain: Secondary | ICD-10-CM

## 2020-10-09 MED ORDER — NAPROXEN 500 MG PO TABS: 500.0000 mg | ORAL_TABLET | Freq: Two times a day (BID) | ORAL | 5 refills | Status: DC

## 2020-10-09 NOTE — Progress Notes (Signed)
[name], female DOB:1951-05-20, 70 y.o. TFT:[id number]  Chief Complaint  Patient presents with  . Knee Pain    LEFT knee MRI    HPI  Kim Grimes is a 70 y.o. female who has pain of the left knee.  The injection did not help.  She cannot take prednisone orally.  She had MRI of the knee showing:  IMPRESSION: 1. 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 Mild peripheral meniscal extrusion. Small radial tear of the free edge of the posterior horn of the medial meniscus. 2. Tricompartmental cartilage abnormalities as described above. 3. Mild edema in superolateral Hoffa's fat as can be seen with patellar tendon-lateral femoral condyle friction syndrome.  I have informed her of the findings.  I would not recommend arthroscopy at this point.  I will begin Naprosyn. She may need viscosupplementation.  I have independently reviewed the MRI.       Body mass index is 32.51 kg/m.  ROS  Review of Systems  Constitutional: Positive for activity change.  Musculoskeletal: Positive for arthralgias, gait problem and joint swelling.  All other systems reviewed and are negative.   All other systems reviewed and are negative.  The following is a summary of the past history medically, past history surgically, known current medicines, social history and family history.  This information is gathered electronically by the computer from prior information and documentation.  I review this each visit and have found including this information at this point in the chart is beneficial and informative.    Past Medical History:  Diagnosis Date  . Anxiety disorder   . Carotid artery stenosis   . Chronic pelvic pain in female   . Coronary artery disease   . Endometriosis   . GERD (gastroesophageal reflux disease)   . Helicobacter pylori gastritis    Dr. Olevia Perches   . Hiatal hernia 2003   on UGI   . Hypothyroidism 2009  . IBS (irritable bowel syndrome) 2003   . Obesity (BMI 30-39.9) DEC 2011 145 LBS  . Osteoporosis   . PONV (postoperative nausea and vomiting)     Past Surgical History:  Procedure Laterality Date  . ABDOMINAL HYSTERECTOMY    . adhesiolysis     dense adhesion between rectum and sigmoid,  & pelvis   . BRAVO [place] STUDY  01/19/2012   Procedure: BRAVO [place];  Surgeon: [name], MD;  [facility];  Service: Endoscopy;;  . CATARACT EXTRACTION W/PHACO Left 06/03/2019   Procedure: CATARACT EXTRACTION PHACO AND INTRAOCULAR LENS PLACEMENT LEFT EYE (CDE: 2.92);  Surgeon: [name], MD;  Location: [facility];  Service: Ophthalmology;  Laterality: Left;  . COLONOSCOPY  03: [facility]/D & 08: SCREENING   WNL'S  . COLONOSCOPY  07/28/2011   sessile polyp in ascending colon/internal hemorrhoids. Next colonoscopy January 2018  . COLONOSCOPY N/A 08/01/2016   Procedure: COLONOSCOPY;  Surgeon: [name], MD;  [facility]: [facility];  Service: Endoscopy;  Laterality: N/A;  930   . ESOPHAGOGASTRODUODENOSCOPY  01/19/2012   SLF: SMALL Hiatal hernia/Mild gastritis  . EYE SURGERY Right 10/18/2013   catarat extraction  . LAPAROSCOPY    . OLECRANON BURSECTOMY Left 07/01/2018   Procedure: LEFT ELBOW OLECRANON BURSECTOMY AND SPUR EXCISION;  Surgeon: [name], MD;  Location: [facility];  Service: Orthopedics;  Laterality: Left;  . rt foot sx Bilateral   . s/p hysterectomy    . SALPINGOOPHORECTOMY    . UPPER GASTROINTESTINAL ENDOSCOPY  DEC 2010: DYSPEPSIA/DYSPHAGIA   [place] RING/ESO DIL 16 MM, GASTRITIS/DUODENITIS 2o to Aleve/on OMP PRN    Family History  Problem Relation Age of Onset  . Prostate cancer Father   . Colon cancer Father        < 40 YO  . Cancer Father        prostate and colon  . Pancreatic cancer Mother   . Cancer Mother        pancreas  . Diabetes Mother   . Diabetes Brother   . Cancer Brother        prostate   . Diabetes Sister        prediabetic  . Heart disease Brother        CHF  . Colon polyps Neg Hx    . Thyroid disease Neg Hx     Social History Social History   Tobacco Use  . Smoking status: Never Smoker  . Smokeless tobacco: Never Used  . Tobacco comment: Never smoked  Vaping Use  . Vaping Use: Never used  Substance Use Topics  . Alcohol use: Yes    Alcohol/week: 0.0 standard drinks    Comment: occasional, maybe once/year  . Drug use: No    Allergies  Allergen Reactions  . Aspirin Other (See Comments)    "blood rushing"  . Diazepam Other (See Comments)    Aggitation  . Prednisone Other (See Comments)    Any type of steroids. Pt states she has seen what they do to other people and she just doesn't take them    Current Outpatient Medications  Medication Sig Dispense Refill  . fluticasone (FLONASE) 50 MCG/ACT nasal spray Place 2 sprays into both nostrils daily. (Patient taking differently: Place 2 sprays into both nostrils daily as needed for allergies.) 16 g 6  . levothyroxine (SYNTHROID) 88 MCG tablet Take 1 tablet (88 mcg total) by mouth daily before breakfast. 90 tablet 3  . naproxen (NAPROSYN) 500 MG tablet Take 1 tablet (500 mg total) by mouth 2 (two) times daily with a meal. 60 tablet 5  . omeprazole (PRILOSEC) 20 MG capsule TAKE 1 CAPSULE (20 MG TOTAL) BY MOUTH DAILY BEFORE BREAKFAST. 90 capsule 1  . Vitamin D, Ergocalciferol, (DRISDOL) 1.25 MG (50000 UNIT) CAPS capsule TAKE 1 CAPSULE EVERY WEEK 12 capsule 1   No current facility-administered medications for this visit.     Physical Exam  Blood pressure (!) 141/85, pulse 83, height 4\' 8"  (1.422 m), weight 145 lb (65.8 kg).  Constitutional: overall normal hygiene, normal nutrition, well developed, normal grooming, normal body habitus. Assistive device:none  Musculoskeletal: gait and station Limp left, muscle tone and strength are normal, no tremors or atrophy is present.  .  Neurological: coordination overall normal.  Deep tendon reflex/nerve stretch intact.  Sensation normal.  Cranial nerves II-XII intact.    Skin:   Normal overall no scars, lesions, ulcers or rashes. No psoriasis.  Psychiatric: Alert and oriented x 3.  Recent memory intact, remote memory unclear.  Normal mood and affect. Well groomed.  Good eye contact.  Cardiovascular: overall no swelling, no varicosities, no edema bilaterally, normal temperatures of the legs and arms, no clubbing, cyanosis and good capillary refill.  Lymphatic: palpation is normal.  Left knee has slight effusion, crepitus, ROM 0 to 110, slight limp left, NV intact, stable.  All other systems reviewed and are negative   The patient has been educated about the nature of the problem(s) and counseled on treatment options.  The  patient appeared to understand what I have discussed and is in agreement with it.  Encounter Diagnosis  Name Primary?  . Chronic pain of left knee Yes    PLAN Call if any problems.  Precautions discussed.  Continue current medications. Begin Naprosyn.  Return to clinic 2 weeks   Electronically Signed Sanjuana Kava, MD 4/12/20228:46 AM

## 2020-10-10 ENCOUNTER — Ambulatory Visit: Payer: PPO | Admitting: Gastroenterology

## 2020-10-10 ENCOUNTER — Encounter: Payer: Self-pay | Admitting: Gastroenterology

## 2020-10-10 VITALS — BP 134/89 | HR 79 | Temp 97.6°F | Ht <= 58 in | Wt 147.6 lb

## 2020-10-10 DIAGNOSIS — K219 Gastro-esophageal reflux disease without esophagitis: Secondary | ICD-10-CM

## 2020-10-10 MED ORDER — PANTOPRAZOLE SODIUM 40 MG PO TBEC
40.0000 mg | DELAYED_RELEASE_TABLET | Freq: Every day | ORAL | 3 refills | Status: DC
Start: 2020-10-10 — End: 2021-03-05

## 2020-10-10 NOTE — Progress Notes (Signed)
Primary Care Physician:  Fayrene Helper, MD  Primary Gastroenterologist:  Formerly Barney Drain, MD   Chief Complaint  Patient presents with  . Gastroesophageal Reflux    C/o indigestion 1-2 times per week    HPI:  Kim Grimes is a 70 y.o. female with history of IBS, GERD, family history of colon cancer (father less than age 16) presenting for follow-up.  Last seen in 2018.  Currently up-to-date on colonoscopy, due in February 2023.  Overall doing okay.  Notes that she has to take omeprazole every day or she has significant reflux symptoms.  She continues to have several days a week that she has indigestion, sensation that she needs to belch for relief.  Denies dysphagia.  No abdominal pain.  Bowel movements are regular.  No blood in the stool or melena.  Previously failed Nexium.  Has been on Dexilant in the past, by way of samples, she cannot recall how effective it was.  Current Outpatient Medications  Medication Sig Dispense Refill  . fluticasone (FLONASE) 50 MCG/ACT nasal spray Place 2 sprays into both nostrils daily. (Patient taking differently: Place 2 sprays into both nostrils daily as needed for allergies.) 16 g 6  . levothyroxine (SYNTHROID) 88 MCG tablet Take 1 tablet (88 mcg total) by mouth daily before breakfast. 90 tablet 3  . naproxen (NAPROSYN) 500 MG tablet Take 1 tablet (500 mg total) by mouth 2 (two) times daily with a meal. 60 tablet 5  . omeprazole (PRILOSEC) 20 MG capsule TAKE 1 CAPSULE (20 MG TOTAL) BY MOUTH DAILY BEFORE BREAKFAST. 90 capsule 1  . Vitamin D, Ergocalciferol, (DRISDOL) 1.25 MG (50000 UNIT) CAPS capsule TAKE 1 CAPSULE EVERY WEEK 12 capsule 1   No current facility-administered medications for this visit.    Allergies as of 10/10/2020 - Review Complete 10/10/2020  Allergen Reaction Noted  . Aspirin Other (See Comments) 05/09/2014  . Diazepam Other (See Comments)   . Prednisone Other (See Comments) 02/21/2020    Past Medical History:   Diagnosis Date  . Anxiety disorder   . Carotid artery stenosis   . Chronic pelvic pain in female   . Coronary artery disease   . Endometriosis   . GERD (gastroesophageal reflux disease)   . Helicobacter pylori gastritis    Dr. Olevia Perches   . Hiatal hernia 2003   on UGI   . Hypothyroidism 2009  . IBS (irritable bowel syndrome) 2003  . Obesity (BMI 30-39.9) DEC 2011 145 LBS  . Osteoporosis   . PONV (postoperative nausea and vomiting)     Past Surgical History:  Procedure Laterality Date  . ABDOMINAL HYSTERECTOMY    . adhesiolysis     dense adhesion between rectum and sigmoid,  & pelvis   . BRAVO [place] STUDY  01/19/2012   Procedure: BRAVO [place];  Surgeon: [name], MD;  [facility];  Service: Endoscopy;;  . CATARACT EXTRACTION W/PHACO Left 06/03/2019   Procedure: CATARACT EXTRACTION PHACO AND INTRAOCULAR LENS PLACEMENT LEFT EYE (CDE: 2.92);  Surgeon: [name], MD;  Location: [facility];  Service: Ophthalmology;  Laterality: Left;  . COLONOSCOPY  03: AP/D & 08: SCREENING   WNL'S  . COLONOSCOPY  07/28/2011   sessile polyp in ascending colon/internal hemorrhoids. Next colonoscopy January 2018  . COLONOSCOPY N/A 08/01/2016   Procedure: COLONOSCOPY;  Surgeon: [name], MD;  [facility];  Service: Endoscopy;  Laterality: N/A;  930   . ESOPHAGOGASTRODUODENOSCOPY  01/19/2012   SLF:  SMALL Hiatal hernia/Mild gastritis  . EYE SURGERY Right 10/18/2013   catarat extraction  . LAPAROSCOPY    . OLECRANON BURSECTOMY Left 07/01/2018   Procedure: LEFT ELBOW OLECRANON BURSECTOMY AND SPUR EXCISION;  Surgeon: [name], MD;  Location: [facility];  Service: Orthopedics;  Laterality: Left;  . rt foot sx Bilateral   . s/p hysterectomy    . SALPINGOOPHORECTOMY    . UPPER GASTROINTESTINAL ENDOSCOPY  DEC 2010: DYSPEPSIA/DYSPHAGIA   [place] RING/ESO DIL 16 MM, GASTRITIS/DUODENITIS 2o to Aleve/on OMP PRN    Family History  Problem Relation Age of Onset  . Prostate  cancer Father   . Colon cancer Father        < 90 YO  . Cancer Father        prostate and colon  . Pancreatic cancer Mother   . Cancer Mother        pancreas  . Diabetes Mother   . Diabetes Brother   . Cancer Brother        prostate   . Diabetes Sister        prediabetic  . Heart disease Brother        CHF  . Colon polyps Neg Hx   . Thyroid disease Neg Hx     Social History   Socioeconomic History  . Marital status: Divorced    Spouse name: Not on file  . Number of children: Not on file  . Years of education: Not on file  . Highest education level: Not on file  Occupational History  . Occupation: fulltime at [employer]   Tobacco Use  . Smoking status: Never Smoker  . Smokeless tobacco: Never Used  . Tobacco comment: Never smoked  Vaping Use  . Vaping Use: Never used  Substance and Sexual Activity  . Alcohol use: Yes    Alcohol/week: 0.0 standard drinks    Comment: occasional, maybe once/year  . Drug use: No  . Sexual activity: Not Currently    Birth control/protection: None  Other Topics Concern  . Not on file  Social History Narrative  . Not on file   Social Determinants of Health   Financial Resource Strain: Not on file  Food Insecurity: Not on file  Transportation Needs: Not on file  Physical Activity: Not on file  Stress: Not on file  Social Connections: Not on file  Intimate Partner Violence: Not on file      ROS:  General: Negative for anorexia, weight loss, fever, chills, fatigue, weakness. Eyes: Negative for vision changes.  ENT: Negative for hoarseness, difficulty swallowing , nasal congestion. CV: Negative for chest pain, angina, palpitations, dyspnea on exertion, peripheral edema.  Respiratory: Negative for dyspnea at rest, dyspnea on exertion, cough, sputum, wheezing.  GI: See history of present illness. GU:  Negative for dysuria, hematuria, urinary incontinence, urinary frequency, nocturnal urination.  MS: positive for  joint pain, no low back pain.  Derm: Negative for rash or itching.  Neuro: Negative for weakness, abnormal sensation, seizure, frequent headaches, memory loss, confusion.  Psych: Negative for anxiety, depression, suicidal ideation, hallucinations.  Endo: Negative for unusual weight change.  Heme: Negative for bruising or bleeding. Allergy: Negative for rash or hives.    Physical Examination:  BP 134/89   Pulse 79   Temp 97.6 F (36.4 C)   Ht 4\' 8"  (1.422 m)   Wt 147 lb 9.6 oz (67 kg)   BMI 33.09 kg/m    General: Well-nourished, well-developed in no acute  distress.  Head: Normocephalic, atraumatic.   Eyes: Conjunctiva pink, no icterus. Mouth:masked. Neck: Supple without thyromegaly, masses, or lymphadenopathy.  Lungs: Clear to auscultation bilaterally.  Heart: Regular rate and rhythm, no murmurs rubs or gallops.  Abdomen: Bowel sounds are normal, nontender, nondistended, no hepatosplenomegaly or masses, no abdominal bruits or    hernia , no rebound or guarding.   Rectal: not performed Extremities: No lower extremity edema. No clubbing or deformities.  Neuro: Alert and oriented x 4 , grossly normal neurologically.  Skin: Warm and dry, no rash or jaundice.   Psych: Alert and cooperative, normal mood and affect.  Labs: Lab Results  Component Value Date   CREATININE 0.86 07/31/2020   BUN 11 07/31/2020   NA 140 07/31/2020   K 4.1 07/31/2020   CL 103 07/31/2020   CO2 25 07/31/2020   Lab Results  Component Value Date   ALT 14 07/31/2020   AST 20 07/31/2020   ALKPHOS 79 07/31/2020   BILITOT 0.3 07/31/2020   Lab Results  Component Value Date   WBC 4.5 07/31/2020   HGB 13.6 07/31/2020   HCT 41.6 07/31/2020   MCV 88 07/31/2020   PLT 266 07/31/2020   Lab Results  Component Value Date   HGBA1C 5.5 07/31/2020     Imaging Studies: MR Knee Left  Wo Contrast  Result Date: 10/07/2020 CLINICAL DATA:  Chronic knee pain. EXAM: MRI OF THE LEFT KNEE WITHOUT CONTRAST  TECHNIQUE: Multiplanar, multisequence MR imaging of the knee was performed. No intravenous contrast was administered. COMPARISON:  None. FINDINGS: MENISCI Medial: Degeneration of the posterior horn of the medial meniscus. Mild peripheral meniscal extrusion. Small radial tear of the free edge of the posterior horn of the medial meniscus. Lateral: Intact. LIGAMENTS Cruciates: ACL and PCL are intact. Collaterals: Medial collateral ligament is intact. Lateral collateral ligament complex is intact. CARTILAGE Patellofemoral: Partial-thickness cartilage loss of the medial patellofemoral compartment. Medial: High-grade partial-thickness cartilage loss of the medial femorotibial compartment with areas of full-thickness cartilage loss of the medial femoral condyle. Severe subchondral marrow edema in the medial tibial plateau which may reflect stress reaction versus reactive edema. Lateral: Partial thickness cartilage loss of the lateral femorotibial compartment with a focal area of full-thickness cartilage loss of the lateral tibial plateau. JOINT: Small joint effusion. Mild edema in superolateral Hoffa's fat. No plical thickening. POPLITEAL FOSSA: Popliteus tendon is intact. Tiny Baker's cyst. EXTENSOR MECHANISM: Intact quadriceps tendon. Intact patellar tendon. Intact lateral patellar retinaculum. Intact medial patellar retinaculum. Intact MPFL. BONES: No aggressive osseous lesion. No fracture or dislocation. Other: No fluid collection or hematoma. Muscles are normal. Soft tissue edema superficial to the patellar tendon. IMPRESSION: 1. 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 Mild peripheral meniscal extrusion. Small radial tear of the free edge of the posterior horn of the medial meniscus. 2. Tricompartmental cartilage abnormalities as described above. 3. Mild edema in superolateral Hoffa's fat as can be seen with patellar tendon-lateral femoral condyle friction syndrome. Electronically Signed   By: Kathreen Devoid   On: 10/07/2020 08:01   DG Knee 3 Views Left  Result Date: 09/18/2020 Clinical:  Left knee pain after using treadmill. X-rays were done of the left knee, three views. There is mild degenerative changes of the left knee with medial narrowing present.  No fracture or loose body noted.  Bone quality is good. Impression:  Mild degenerative changes of the left knee. Electronically Signed Sanjuana Kava, MD 3/22/20228:40 AM   Assessment/plan:  Pleasant 69 year old  female with history of chronic GERD presenting for follow-up.  Last seen in 2018.  Takes omeprazole 20 mg daily.  Controls most of her reflux/heartburn but continues to have several days a week with indigestion.  Patient notes that if she misses a dose she has recurrent terrible reflux.  Discussed antireflux measures today.  Trial of different PPI to see if this helps control her symptoms better.  We will switch her from omeprazole to pantoprazole 40 mg 30 minutes before breakfast.  She will call if she has ongoing problems.  Otherwise we will see her back in February 2023 at which time we will schedule her for her next colonoscopy.

## 2020-10-10 NOTE — Patient Instructions (Signed)
1. Stop omeprazole.  Start pantoprazole 40 mg, 30 minutes before breakfast. 2. Call if you continue to have problems with indigestion. 3. We will see back in February 2023, we will schedule your colonoscopy at that time.

## 2020-10-15 ENCOUNTER — Ambulatory Visit (HOSPITAL_COMMUNITY): Payer: PPO

## 2020-10-15 ENCOUNTER — Encounter: Payer: Self-pay | Admitting: Gastroenterology

## 2020-10-15 NOTE — Progress Notes (Signed)
CC'ED TO PCP 

## 2020-10-16 ENCOUNTER — Ambulatory Visit: Payer: PPO | Admitting: Orthopaedic Surgery

## 2020-10-18 ENCOUNTER — Ambulatory Visit: Payer: PPO | Admitting: Orthopaedic Surgery

## 2020-10-23 ENCOUNTER — Encounter: Payer: Self-pay | Admitting: Orthopaedic Surgery

## 2020-10-23 ENCOUNTER — Ambulatory Visit (INDEPENDENT_AMBULATORY_CARE_PROVIDER_SITE_OTHER): Payer: PPO | Admitting: Orthopaedic Surgery

## 2020-10-23 ENCOUNTER — Other Ambulatory Visit: Payer: Self-pay

## 2020-10-23 VITALS — BP 137/95 | HR 71 | Ht <= 58 in

## 2020-10-23 DIAGNOSIS — M25562 Pain in left knee: Secondary | ICD-10-CM

## 2020-10-23 DIAGNOSIS — G8929 Other chronic pain: Secondary | ICD-10-CM | POA: Diagnosis not present

## 2020-10-23 NOTE — Progress Notes (Signed)
My knee still hurts.  We have talked about viscosupplementation of the left knee.  She asked many questions.  I will have her return in one week to begin this.  I again answered many questions over 20 minutes with her.  Encounter Diagnosis  Name Primary?  . Chronic pain of left knee Yes   Return in one week for first orthovisc injection.  Call if any problem.  Precautions discussed.   Electronically Signed Sanjuana Kava, MD 4/26/20222:53 PM

## 2020-10-24 ENCOUNTER — Telehealth: Payer: Self-pay | Admitting: Radiology

## 2020-10-24 NOTE — Telephone Encounter (Signed)
Bo Mcclintock, CMA  Camber Ninh, RT Dr. Luna Glasgow suggest Orthovisc for this patient. Her insurance is [employer]. Patient would like to know her out of pocket expense if any.   Thanks

## 2020-10-25 ENCOUNTER — Telehealth: Payer: Self-pay | Admitting: Orthopaedic Surgery

## 2020-10-25 NOTE — Telephone Encounter (Signed)
I called patient and LM advising working on benefits investigation.

## 2020-10-25 NOTE — Telephone Encounter (Signed)
Patient called to follow up on amount of copay for the Orthovisc medication. States needs to know prior to her appointment 10/30/20, as if it is too costly, she will need to decline to have the injections.

## 2020-10-25 NOTE — Telephone Encounter (Signed)
Submitted online [employer] for benefits investigation.  Will let patient know what I find out.

## 2020-10-26 NOTE — Telephone Encounter (Signed)
Ok for [employer], patient will owe 20% OOP, buy and bill ok, no PA needed.  Pt has appt Tues 5/3, call her Monday am to advise.

## 2020-10-29 NOTE — Telephone Encounter (Signed)
I spoke to patient, advised no copay, will owe the 20% OOP, and scheduled #2 and #3 visits for Orthovisc.

## 2020-10-30 ENCOUNTER — Other Ambulatory Visit: Payer: Self-pay

## 2020-10-30 ENCOUNTER — Ambulatory Visit (INDEPENDENT_AMBULATORY_CARE_PROVIDER_SITE_OTHER): Payer: PPO | Admitting: Orthopaedic Surgery

## 2020-10-30 ENCOUNTER — Encounter: Payer: Self-pay | Admitting: Orthopaedic Surgery

## 2020-10-30 DIAGNOSIS — M1712 Unilateral primary osteoarthritis, left knee: Secondary | ICD-10-CM | POA: Diagnosis not present

## 2020-10-30 DIAGNOSIS — G8929 Other chronic pain: Secondary | ICD-10-CM

## 2020-10-30 NOTE — Progress Notes (Signed)
This patient is diagnosed with osteoarthritis of the knee(s).    Radiographs show evidence of joint space narrowing, osteophytes, subchondral sclerosis and/or subchondral cysts.  This patient has knee pain which interferes with functional and activities of daily living.    This patient has experienced inadequate response, adverse effects and/or intolerance with conservative treatments such as acetaminophen, NSAIDS, topical creams, physical therapy or regular exercise, knee bracing and/or weight loss.   This patient has experienced inadequate response or has a contraindication to intra articular steroid injections for at least 3 months.   This patient is not scheduled to have a total knee replacement within 6 months of starting treatment with viscosupplementation.  PROCEDURE NOTE:  Orthovisc injection #  1 of 3   Injection 1 vial of Orthovisc into left  knee  There were no vitals taken for this visit.  The left knee exam: there was no synovitis or infection   The knee was prepped sterilely  Ethyl chloride was used to anesthetize the skin A 20 g needle was used to inject the knee with 1 vial of Orthovisc A sterile dressing was placed  There were no complications  Encounter Diagnosis  Name Primary?  . Chronic pain of left knee Yes    Follow up one week  Call if any problem.  Precautions discussed.   Electronically Signed Sanjuana Kava, MD 5/3/20228:07 AM

## 2020-11-01 ENCOUNTER — Ambulatory Visit: Payer: PPO | Admitting: Orthopedic Surgery

## 2020-11-06 ENCOUNTER — Ambulatory Visit (INDEPENDENT_AMBULATORY_CARE_PROVIDER_SITE_OTHER): Payer: PPO | Admitting: Orthopaedic Surgery

## 2020-11-06 ENCOUNTER — Other Ambulatory Visit: Payer: Self-pay

## 2020-11-06 ENCOUNTER — Encounter: Payer: Self-pay | Admitting: Orthopaedic Surgery

## 2020-11-06 DIAGNOSIS — M1712 Unilateral primary osteoarthritis, left knee: Secondary | ICD-10-CM

## 2020-11-06 DIAGNOSIS — G8929 Other chronic pain: Secondary | ICD-10-CM

## 2020-11-06 NOTE — Progress Notes (Signed)
This patient is diagnosed with osteoarthritis of the knee(s).    Radiographs show evidence of joint space narrowing, osteophytes, subchondral sclerosis and/or subchondral cysts.  This patient has knee pain which interferes with functional and activities of daily living.    This patient has experienced inadequate response, adverse effects and/or intolerance with conservative treatments such as acetaminophen, NSAIDS, topical creams, physical therapy or regular exercise, knee bracing and/or weight loss.   This patient has experienced inadequate response or has a contraindication to intra articular steroid injections for at least 3 months.   This patient is not scheduled to have a total knee replacement within 6 months of starting treatment with viscosupplementation.  PROCEDURE NOTE:  Orthovisc injection #  2 of 3   Injection 1 vial of Orthovisc into left  knee  There were no vitals taken for this visit.  The left knee exam: there was no synovitis or infection   The knee was prepped sterilely  Ethyl chloride was used to anesthetize the skin A 20 g needle was used to inject the knee with 1 vial of Orthovisc A sterile dressing was placed  There were no complications  Encounter Diagnosis  Name Primary?  . Chronic pain of left knee Yes    Follow up one week  Electronically Signed Sanjuana Kava, MD 5/10/20228:35 AM

## 2020-11-08 ENCOUNTER — Telehealth (INDEPENDENT_AMBULATORY_CARE_PROVIDER_SITE_OTHER): Payer: PPO | Admitting: Nurse Practitioner

## 2020-11-08 ENCOUNTER — Other Ambulatory Visit: Payer: Self-pay

## 2020-11-08 ENCOUNTER — Encounter: Payer: Self-pay | Admitting: Nurse Practitioner

## 2020-11-08 DIAGNOSIS — J069 Acute upper respiratory infection, unspecified: Secondary | ICD-10-CM | POA: Insufficient documentation

## 2020-11-08 MED ORDER — AZITHROMYCIN 250 MG PO TABS: ORAL_TABLET | ORAL | 0 refills | Status: DC

## 2020-11-08 MED ORDER — ALBUTEROL SULFATE HFA 108 (90 BASE) MCG/ACT IN AERS: 2.0000 | INHALATION_SPRAY | Freq: Four times a day (QID) | RESPIRATORY_TRACT | 0 refills | Status: DC | PRN

## 2020-11-08 NOTE — Patient Instructions (Signed)
Return to clinic in 5-7 days if no improvement, sooner if you get worse.

## 2020-11-08 NOTE — Assessment & Plan Note (Signed)
-  Rx. z-pack -offered prednisone, but she does not like to take steroids -Rx. Albuterol for wheezing

## 2020-11-08 NOTE — Progress Notes (Signed)
Acute Office Visit  Subjective:    Patient ID: Kim Grimes, female    DOB: 05-14-1951, 70 y.o.   MRN: [id number]  Chief Complaint  Patient presents with  . Cough    Drainage down throat, x 3 weeks. Intermittent. Pt has been taking otc medication without relief.     HPI Patient is in today for sick visit.  She has had PND x 3 weeks off and on. Symptoms per ROS.  She has been coughing. She has tried taking flonase, OTC sinus pills (made her drowsy).  She has been using cough drops.  Past Medical History:  Diagnosis Date  . Anxiety disorder   . Carotid artery stenosis   . Chronic pelvic pain in female   . Coronary artery disease   . Endometriosis   . GERD (gastroesophageal reflux disease)   . Helicobacter pylori gastritis    Dr. Olevia Perches   . Hiatal hernia 2003   on UGI   . Hypothyroidism 2009  . IBS (irritable bowel syndrome) 2003  . Obesity (BMI 30-39.9) DEC 2011 145 LBS  . Osteoporosis   . PONV (postoperative nausea and vomiting)     Past Surgical History:  Procedure Laterality Date  . ABDOMINAL HYSTERECTOMY    . adhesiolysis     dense adhesion between rectum and sigmoid,  & pelvis   . BRAVO [place] STUDY  01/19/2012   Procedure: BRAVO [place];  Surgeon: [name], MD;  [facility];  Service: Endoscopy;;  . CATARACT EXTRACTION W/PHACO Left 06/03/2019   Procedure: CATARACT EXTRACTION PHACO AND INTRAOCULAR LENS PLACEMENT LEFT EYE (CDE: 2.92);  Surgeon: [name], MD;  Location: [facility];  Service: Ophthalmology;  Laterality: Left;  . COLONOSCOPY  03: [facility]/D & 08: SCREENING   WNL'S  . COLONOSCOPY  07/28/2011   sessile polyp in ascending colon/internal hemorrhoids. Next colonoscopy January 2018  . COLONOSCOPY N/A 08/01/2016   Procedure: COLONOSCOPY;  Surgeon: [name], MD;  [facility];  Service: Endoscopy;  Laterality: N/A;  930   . ESOPHAGOGASTRODUODENOSCOPY  01/19/2012   SLF: SMALL Hiatal hernia/Mild gastritis  . EYE SURGERY Right  10/18/2013   catarat extraction  . LAPAROSCOPY    . OLECRANON BURSECTOMY Left 07/01/2018   Procedure: LEFT ELBOW OLECRANON BURSECTOMY AND SPUR EXCISION;  Surgeon: [name], MD;  Location: [facility];  Service: Orthopedics;  Laterality: Left;  . rt foot sx Bilateral   . s/p hysterectomy    . SALPINGOOPHORECTOMY    . UPPER GASTROINTESTINAL ENDOSCOPY  DEC 2010: DYSPEPSIA/DYSPHAGIA   [place] RING/ESO DIL 16 MM, GASTRITIS/DUODENITIS 2o to Aleve/on OMP PRN    Family History  Problem Relation Age of Onset  . Prostate cancer Father   . Colon cancer Father        < 45 YO  . Cancer Father        prostate and colon  . Pancreatic cancer Mother   . Cancer Mother        pancreas  . Diabetes Mother   . Diabetes Brother   . Cancer Brother        prostate   . Diabetes Sister        prediabetic  . Heart disease Brother        CHF  . Thyroid disease Neg Hx     Social History   Socioeconomic History  . Marital status: Divorced    Spouse name: Not on file  . Number of children: Not  on file  . Years of education: Not on file  . Highest education level: Not on file  Occupational History  . Occupation: fulltime at [employer]   Tobacco Use  . Smoking status: Never Smoker  . Smokeless tobacco: Never Used  . Tobacco comment: Never smoked  Vaping Use  . Vaping Use: Never used  Substance and Sexual Activity  . Alcohol use: Yes    Alcohol/week: 0.0 standard drinks    Comment: occasional, maybe once/year  . Drug use: No  . Sexual activity: Not Currently    Birth control/protection: None  Other Topics Concern  . Not on file  Social History Narrative  . Not on file   Social Determinants of Health   Financial Resource Strain: Not on file  Food Insecurity: Not on file  Transportation Needs: Not on file  Physical Activity: Not on file  Stress: Not on file  Social Connections: Not on file  Intimate Partner Violence: Not on file    Outpatient Medications Prior to Visit   Medication Sig Dispense Refill  . fluticasone (FLONASE) 50 MCG/ACT nasal spray Place 2 sprays into both nostrils daily. (Patient taking differently: Place 2 sprays into both nostrils daily as needed for allergies.) 16 g 6  . levothyroxine (SYNTHROID) 88 MCG tablet Take 1 tablet (88 mcg total) by mouth daily before breakfast. 90 tablet 3  . naproxen (NAPROSYN) 500 MG tablet Take 1 tablet (500 mg total) by mouth 2 (two) times daily with a meal. 60 tablet 5  . pantoprazole (PROTONIX) 40 MG tablet Take 1 tablet (40 mg total) by mouth daily before breakfast. 90 tablet 3  . Vitamin D, Ergocalciferol, (DRISDOL) 1.25 MG (50000 UNIT) CAPS capsule TAKE 1 CAPSULE EVERY WEEK 12 capsule 1   No facility-administered medications prior to visit.    Allergies  Allergen Reactions  . Aspirin Other (See Comments)    "blood rushing"  . Diazepam Other (See Comments)    Aggitation  . Prednisone Other (See Comments)    Any type of steroids. Pt states she has seen what they do to other people and she just doesn't take them    Review of Systems  Constitutional: Negative for chills, fatigue and fever.  HENT: Positive for postnasal drip. Negative for congestion, rhinorrhea, sinus pressure and sinus pain.   Respiratory: Positive for cough and wheezing. Negative for choking and shortness of breath.   Cardiovascular: Negative.   Neurological: Positive for headaches.       Objective:    Physical Exam  There were no vitals taken for this visit. Wt Readings from Last 3 Encounters:  10/10/20 147 lb 9.6 oz (67 kg)  10/09/20 145 lb (65.8 kg)  10/02/20 148 lb (67.1 kg)    There are no preventive care reminders to display for this patient.  There are no preventive care reminders to display for this patient.   Lab Results  Component Value Date   TSH 1.95 07/12/2020   Lab Results  Component Value Date   WBC 4.5 07/31/2020   HGB 13.6 07/31/2020   HCT 41.6 07/31/2020   MCV 88 07/31/2020   PLT 266  07/31/2020   Lab Results  Component Value Date   NA 140 07/31/2020   K 4.1 07/31/2020   CO2 25 07/31/2020   GLUCOSE 86 07/31/2020   BUN 11 07/31/2020   CREATININE 0.86 07/31/2020   BILITOT 0.3 07/31/2020   ALKPHOS 79 07/31/2020   AST 20 07/31/2020   ALT 14 07/31/2020  PROT 6.3 07/31/2020   ALBUMIN 4.0 07/31/2020   CALCIUM 9.2 07/31/2020   ANIONGAP 5 06/22/2018   Lab Results  Component Value Date   CHOL 192 07/31/2020   Lab Results  Component Value Date   HDL 56 07/31/2020   Lab Results  Component Value Date   LDLCALC 120 (H) 07/31/2020   Lab Results  Component Value Date   TRIG 89 07/31/2020   Lab Results  Component Value Date   CHOLHDL 3.2 07/04/2019   Lab Results  Component Value Date   HGBA1C 5.5 07/31/2020       Assessment & Plan:   Problem List Items Addressed This Visit      Respiratory   Upper respiratory tract infection - Primary    -Rx. z-pack -offered prednisone, but she does not like to take steroids -Rx. Albuterol for wheezing      Relevant Medications   azithromycin (ZITHROMAX) 250 MG tablet   albuterol (VENTOLIN HFA) 108 (90 Base) MCG/ACT inhaler       Meds ordered this encounter  Medications  . azithromycin (ZITHROMAX) 250 MG tablet    Sig: Please dispense as a z-pack    Dispense:  6 tablet    Refill:  0  . albuterol (VENTOLIN HFA) 108 (90 Base) MCG/ACT inhaler    Sig: Inhale 2 puffs into the lungs every 6 (six) hours as needed for wheezing or shortness of breath.    Dispense:  8 g    Refill:  0   Date:  11/08/2020   Location of Patient: Home Location of Provider: Office Consent was obtain for visit to be over via telehealth. I verified that I am speaking with the correct person using two identifiers.  I connected with  Kim Grimes on 11/08/20 via telephone and verified that I am speaking with the correct person using two identifiers.   I discussed the limitations of evaluation and management by telemedicine.  The patient expressed understanding and agreed to proceed.  Time spent: 8 min   Noreene Larsson, NP

## 2020-11-13 ENCOUNTER — Encounter: Payer: Self-pay | Admitting: Orthopaedic Surgery

## 2020-11-13 ENCOUNTER — Other Ambulatory Visit: Payer: Self-pay

## 2020-11-13 ENCOUNTER — Ambulatory Visit (INDEPENDENT_AMBULATORY_CARE_PROVIDER_SITE_OTHER): Payer: PPO | Admitting: Orthopaedic Surgery

## 2020-11-13 DIAGNOSIS — M25562 Pain in left knee: Secondary | ICD-10-CM | POA: Diagnosis not present

## 2020-11-13 DIAGNOSIS — G8929 Other chronic pain: Secondary | ICD-10-CM

## 2020-11-13 NOTE — Progress Notes (Signed)
This patient is diagnosed with osteoarthritis of the knee(s).    Radiographs show evidence of joint space narrowing, osteophytes, subchondral sclerosis and/or subchondral cysts.  This patient has knee pain which interferes with functional and activities of daily living.    This patient has experienced inadequate response, adverse effects and/or intolerance with conservative treatments such as acetaminophen, NSAIDS, topical creams, physical therapy or regular exercise, knee bracing and/or weight loss.   This patient has experienced inadequate response or has a contraindication to intra articular steroid injections for at least 3 months.   This patient is not scheduled to have a total knee replacement within 6 months of starting treatment with viscosupplementation.  PROCEDURE NOTE:  Orthovisc injection #  3 of 3   Injection 1 vial of Orthovisc into left  knee  There were no vitals taken for this visit.  The left knee exam: there was no synovitis or infection   The knee was prepped sterilely  Ethyl chloride was used to anesthetize the skin A 20 g needle was used to inject the knee with 1 vial of Orthovisc A sterile dressing was placed  There were no complications  Encounter Diagnosis  Name Primary?  . Chronic pain of left knee Yes    Follow up one month  Electronically Signed Sanjuana Kava, MD 5/17/20223:20 PM

## 2020-11-16 ENCOUNTER — Other Ambulatory Visit: Payer: Self-pay | Admitting: Family Medicine

## 2020-11-27 ENCOUNTER — Other Ambulatory Visit: Payer: Self-pay | Admitting: Nurse Practitioner

## 2020-11-27 ENCOUNTER — Telehealth: Payer: Self-pay

## 2020-11-27 MED ORDER — AMOXICILLIN-POT CLAVULANATE 875-125 MG PO TABS: 1.0000 | ORAL_TABLET | Freq: Two times a day (BID) | ORAL | 0 refills | Status: DC

## 2020-11-27 MED ORDER — PROMETHAZINE-DM 6.25-15 MG/5ML PO SYRP: 5.0000 mL | ORAL_SOLUTION | Freq: Four times a day (QID) | ORAL | 0 refills | Status: DC | PRN

## 2020-11-27 NOTE — Telephone Encounter (Signed)
Pt stated she needs an inhaler called in to help with this cough please advise

## 2020-11-27 NOTE — Telephone Encounter (Signed)
We don't use inhalers for cough, those are for wheezing and tight airways. Would she like some cough syrup or is she having wheezing too?

## 2020-11-27 NOTE — Progress Notes (Signed)
prometha

## 2020-11-27 NOTE — Telephone Encounter (Signed)
She would like some cough syrup called in

## 2020-11-27 NOTE — Telephone Encounter (Signed)
I sent it in 

## 2020-11-27 NOTE — Telephone Encounter (Signed)
I sent in augmentin, an antibiotic, for her. If her symptoms are persisting after she finishes the medication, she will need to come in for a visit.

## 2020-11-27 NOTE — Telephone Encounter (Signed)
Patient called said she is still coughing. Call back # [phone]

## 2020-12-02 ENCOUNTER — Other Ambulatory Visit: Payer: Self-pay | Admitting: Nurse Practitioner

## 2020-12-02 DIAGNOSIS — J069 Acute upper respiratory infection, unspecified: Secondary | ICD-10-CM

## 2020-12-11 ENCOUNTER — Encounter: Payer: Self-pay | Admitting: Orthopaedic Surgery

## 2020-12-11 ENCOUNTER — Other Ambulatory Visit: Payer: Self-pay

## 2020-12-11 ENCOUNTER — Ambulatory Visit: Payer: PPO | Admitting: Orthopaedic Surgery

## 2020-12-11 VITALS — BP 135/83 | HR 83 | Ht <= 58 in | Wt 150.0 lb

## 2020-12-11 DIAGNOSIS — M25562 Pain in left knee: Secondary | ICD-10-CM

## 2020-12-11 DIAGNOSIS — G8929 Other chronic pain: Secondary | ICD-10-CM | POA: Diagnosis not present

## 2020-12-11 NOTE — Progress Notes (Signed)
My knee hurts more today.  She just got back from a 12 hour ride to [place] yesterday and her left knee is more painful.  She has no trauma. She has slightly more swelling.  She said the viscosupplementation helped and she was doing well until yesterday.  She has no numbness, no redness, no giving way.  Left knee has some crepitus and effusion, ROM 0 to 105, slight limp left.  NV Intact.    Encounter Diagnosis  Name Primary?   Chronic pain of left knee Yes   I think her pain today is related to the trip and should be better in a few days.  If not, call.  I will see her in one month.  Call if any problem.  Precautions discussed.  Electronically Signed Sanjuana Kava, MD 6/14/20223:01 PM

## 2020-12-13 ENCOUNTER — Other Ambulatory Visit: Payer: Self-pay

## 2020-12-13 ENCOUNTER — Encounter: Payer: Self-pay | Admitting: Nurse Practitioner

## 2020-12-13 ENCOUNTER — Telehealth (INDEPENDENT_AMBULATORY_CARE_PROVIDER_SITE_OTHER): Payer: PPO | Admitting: Nurse Practitioner

## 2020-12-13 DIAGNOSIS — H9203 Otalgia, bilateral: Secondary | ICD-10-CM | POA: Diagnosis not present

## 2020-12-13 DIAGNOSIS — R059 Cough, unspecified: Secondary | ICD-10-CM | POA: Insufficient documentation

## 2020-12-13 NOTE — Progress Notes (Signed)
Acute Office Visit  Subjective:    Patient ID: Kim Grimes, female    DOB: [birth year], 70 y.o.   MRN: [id number]  Chief Complaint  Patient presents with   Cough    Comes in spells and she didn't start coughing today until 12.    HPI Patient is in today for cough. She was first seen on 11/08/20 for URI. She was prescribed a z-pack and albuterol.  At that time, her biggest symptom was postnasal drip. Her cough persisted, and we called in promethazine-DM for her on 11/27/20. She has tried taking OTC sinus pills, cough drops, and flonase.  Today, she states that she is having drainage form her ears into her throat.   Past Medical History:  Diagnosis Date   Anxiety disorder    Carotid artery stenosis    Chronic pelvic pain in female    Coronary artery disease    Endometriosis    GERD (gastroesophageal reflux disease)    Helicobacter pylori gastritis    Dr. Olevia Perches    Hiatal hernia 2003   on UGI    Hypothyroidism 2009   IBS (irritable bowel syndrome) 2003   Obesity (BMI 30-39.9) DEC 2011 145 LBS   Osteoporosis    PONV (postoperative nausea and vomiting)     Past Surgical History:  Procedure Laterality Date   ABDOMINAL HYSTERECTOMY     adhesiolysis     dense adhesion between rectum and sigmoid,  & pelvis    BRAVO [place] STUDY  01/19/2012   Procedure: BRAVO [place];  Surgeon: [name], MD;  [facility];  Service: Endoscopy;;   CATARACT EXTRACTION W/PHACO Left 06/03/2019   Procedure: CATARACT EXTRACTION PHACO AND INTRAOCULAR LENS PLACEMENT LEFT EYE (CDE: 2.92);  Surgeon: [name], MD;  Location: [facility];  Service: Ophthalmology;  Laterality: Left;   COLONOSCOPY  03: [facility]/D & 08: SCREENING   WNL'S   COLONOSCOPY  07/28/2011   sessile polyp in ascending colon/internal hemorrhoids. Next colonoscopy January 2018   COLONOSCOPY N/A 08/01/2016   Procedure: COLONOSCOPY;  Surgeon: [name], MD;  [facility];  Service: Endoscopy;  Laterality:  N/A;  930    ESOPHAGOGASTRODUODENOSCOPY  01/19/2012   SLF: SMALL Hiatal hernia/Mild gastritis   EYE SURGERY Right 10/18/2013   catarat extraction   LAPAROSCOPY     OLECRANON BURSECTOMY Left 07/01/2018   Procedure: LEFT ELBOW OLECRANON BURSECTOMY AND SPUR EXCISION;  Surgeon: [name], MD;  Location: [facility];  Service: Orthopedics;  Laterality: Left;   rt foot sx Bilateral    s/p hysterectomy     SALPINGOOPHORECTOMY     UPPER GASTROINTESTINAL ENDOSCOPY  DEC 2010: DYSPEPSIA/DYSPHAGIA   [place] RING/ESO DIL 16 MM, GASTRITIS/DUODENITIS 2o to Aleve/on OMP PRN    Family History  Problem Relation Age of Onset   Prostate cancer Father    Colon cancer Father        < 46 YO   Cancer Father        prostate and colon   Pancreatic cancer Mother    Cancer Mother        pancreas   Diabetes Mother    Diabetes Brother    Cancer Brother        prostate    Diabetes Sister        prediabetic   Heart disease Brother        CHF   Thyroid disease Neg Hx     Social History  Socioeconomic History   Marital status: Divorced    Spouse name: Not on file   Number of children: Not on file   Years of education: Not on file   Highest education level: Not on file  Occupational History   Occupation: fulltime at [employer]   Tobacco Use   Smoking status: Never   Smokeless tobacco: Never   Tobacco comments:    Never smoked  Vaping Use   Vaping Use: Never used  Substance and Sexual Activity   Alcohol use: Yes    Alcohol/week: 0.0 standard drinks    Comment: occasional, maybe once/year   Drug use: No   Sexual activity: Not Currently    Birth control/protection: None  Other Topics Concern   Not on file  Social History Narrative   Not on file   Social Determinants of Health   Financial Resource Strain: Not on file  Food Insecurity: Not on file  Transportation Needs: Not on file  Physical Activity: Not on file  Stress: Not on file  Social Connections: Not on file   Intimate Partner Violence: Not on file    Outpatient Medications Prior to Visit  Medication Sig Dispense Refill   albuterol (VENTOLIN HFA) 108 (90 Base) MCG/ACT inhaler TAKE 2 PUFFS BY MOUTH EVERY 6 HOURS AS NEEDED FOR WHEEZE OR SHORTNESS OF BREATH 8.5 each 5   levothyroxine (SYNTHROID) 88 MCG tablet Take 1 tablet (88 mcg total) by mouth daily before breakfast. 90 tablet 3   naproxen (NAPROSYN) 500 MG tablet Take 1 tablet (500 mg total) by mouth 2 (two) times daily with a meal. 60 tablet 5   pantoprazole (PROTONIX) 40 MG tablet Take 1 tablet (40 mg total) by mouth daily before breakfast. 90 tablet 3   promethazine-dextromethorphan (PROMETHAZINE-DM) 6.25-15 MG/5ML syrup Take 5 mLs by mouth 4 (four) times daily as needed for cough. 118 mL 0   Vitamin D, Ergocalciferol, (DRISDOL) 1.25 MG (50000 UNIT) CAPS capsule TAKE 1 CAPSULE BY MOUTH ONE TIME PER WEEK 12 capsule 1   amoxicillin-clavulanate (AUGMENTIN) 875-125 MG tablet Take 1 tablet by mouth 2 (two) times daily. 20 tablet 0   fluticasone (FLONASE) 50 MCG/ACT nasal spray Place 2 sprays into both nostrils daily. 16 g 6   No facility-administered medications prior to visit.    Allergies  Allergen Reactions   Aspirin Other (See Comments)    "blood rushing"   Diazepam Other (See Comments)    Aggitation   Prednisone Other (See Comments)    Any type of steroids. Pt states she has seen what they do to other people and she just doesn't take them    Review of Systems  Constitutional:  Negative for chills, fatigue and fever.  HENT:  Positive for ear pain, postnasal drip and sore throat. Negative for sinus pressure and sinus pain.        Ear fullness  Respiratory:  Positive for cough.   Cardiovascular: Negative.       Objective:    Physical Exam  There were no vitals taken for this visit. Wt Readings from Last 3 Encounters:  12/11/20 150 lb (68 kg)  10/10/20 147 lb 9.6 oz (67 kg)  10/09/20 145 lb (65.8 kg)    Health Maintenance  Due  Topic Date Due   COVID-19 Vaccine (4 - Booster for Moderna series) 08/22/2020   Zoster Vaccines- Shingrix (2 of 2) 09/19/2020    There are no preventive care reminders to display for this patient.   Lab Results  Component Value Date   TSH 1.95 07/12/2020   Lab Results  Component Value Date   WBC 4.5 07/31/2020   HGB 13.6 07/31/2020   HCT 41.6 07/31/2020   MCV 88 07/31/2020   PLT 266 07/31/2020   Lab Results  Component Value Date   NA 140 07/31/2020   K 4.1 07/31/2020   CO2 25 07/31/2020   GLUCOSE 86 07/31/2020   BUN 11 07/31/2020   CREATININE 0.86 07/31/2020   BILITOT 0.3 07/31/2020   ALKPHOS 79 07/31/2020   AST 20 07/31/2020   ALT 14 07/31/2020   PROT 6.3 07/31/2020   ALBUMIN 4.0 07/31/2020   CALCIUM 9.2 07/31/2020   ANIONGAP 5 06/22/2018   Lab Results  Component Value Date   CHOL 192 07/31/2020   Lab Results  Component Value Date   HDL 56 07/31/2020   Lab Results  Component Value Date   LDLCALC 120 (H) 07/31/2020   Lab Results  Component Value Date   TRIG 89 07/31/2020   Lab Results  Component Value Date   CHOLHDL 3.2 07/04/2019   Lab Results  Component Value Date   HGBA1C 5.5 07/31/2020       Assessment & Plan:   Problem List Items Addressed This Visit       Other   Cough - Primary    -unsure of etiology -she states taht she feels like her ears are draining into her throat and that is causing her to cough -she has been treated with abx; z-pack and augmentin, but those didn't help -she states she has steroid allergy, but she was using flonase -STOP flonase -will get labs and CXR -referral to ENT to determine if there is a mechanical issue       Relevant Orders   Basic metabolic panel   CBC with Differential/Platelet   DG Chest 2 View   Ambulatory referral to ENT   Other Visit Diagnoses     Ear pain, bilateral       Relevant Orders   Ambulatory referral to ENT        No orders of the defined types were placed in  this encounter.  Date:  12/13/2020   Location of Patient: Home Location of Provider: Office Consent was obtain for visit to be over via telehealth. I verified that I am speaking with the correct person using two identifiers.  I connected with  Kim Grimes on 12/13/20 via telephone and verified that I am speaking with the correct person using two identifiers.   I discussed the limitations of evaluation and management by telemedicine. The patient expressed understanding and agreed to proceed.  Time Spent: 9 minutes   Noreene Larsson, NP

## 2020-12-13 NOTE — Assessment & Plan Note (Addendum)
-  unsure of etiology -she states taht she feels like her ears are draining into her throat and that is causing her to cough -she has been treated with abx; z-pack and augmentin, but those didn't help -she states she has steroid allergy, but she was using flonase -STOP flonase -will get labs and CXR -referral to ENT to determine if there is a mechanical issue

## 2020-12-13 NOTE — Patient Instructions (Signed)
Stop flonase. If symptoms haven't improved in 1 week, return to clinic for a physical exam so I can evaluate your ears.  Forestine Na can get a chest x-ray at any time. The orders have been sent to radiology.

## 2020-12-20 DIAGNOSIS — R059 Cough, unspecified: Secondary | ICD-10-CM | POA: Diagnosis not present

## 2020-12-21 LAB — BASIC METABOLIC PANEL
BUN/Creatinine Ratio: 14 (ref 12–28)
BUN: 12 mg/dL (ref 8–27)
CO2: 23 mmol/L (ref 20–29)
Calcium: 8.9 mg/dL (ref 8.7–10.3)
Chloride: 107 mmol/L — ABNORMAL HIGH (ref 96–106)
Creatinine, Ser: 0.85 mg/dL (ref 0.57–1.00)
Glucose: 85 mg/dL (ref 65–99)
Potassium: 3.9 mmol/L (ref 3.5–5.2)
Sodium: 144 mmol/L (ref 134–144)
eGFR: 74 mL/min/{1.73_m2} (ref >59)

## 2020-12-21 LAB — CBC WITH DIFFERENTIAL/PLATELET
Basophils Absolute: 0.1 10*3/uL (ref 0.0–0.2)
Basos: 1 %
EOS (ABSOLUTE): 0.3 10*3/uL (ref 0.0–0.4)
Eos: 5 %
Hematocrit: 39.4 % (ref 34.0–46.6)
Hemoglobin: 13 g/dL (ref 11.1–15.9)
Immature Grans (Abs): 0 10*3/uL (ref 0.0–0.1)
Immature Granulocytes: 0 %
Lymphocytes Absolute: 2 10*3/uL (ref 0.7–3.1)
Lymphs: 36 %
MCH: 28.8 pg (ref 26.6–33.0)
MCHC: 33 g/dL (ref 31.5–35.7)
MCV: 87 fL (ref 79–97)
Monocytes Absolute: 0.5 10*3/uL (ref 0.1–0.9)
Monocytes: 9 %
Neutrophils Absolute: 2.7 10*3/uL (ref 1.4–7.0)
Neutrophils: 49 %
Platelets: 283 10*3/uL (ref 150–450)
RBC: 4.51 x10E6/uL (ref 3.77–5.28)
RDW: 13.1 % (ref 11.7–15.4)
WBC: 5.5 10*3/uL (ref 3.4–10.8)

## 2020-12-21 NOTE — Progress Notes (Signed)
Labs look good, WBC aren't elevated, so no infection. Eosinophils aren't elevated, so not likely allergies. Please keep the ENT appointment.

## 2020-12-26 ENCOUNTER — Ambulatory Visit (HOSPITAL_COMMUNITY)
Admission: RE | Admit: 2020-12-26 | Discharge: 2020-12-26 | Disposition: A | Discharge status: Home / Self Care | Payer: PPO | Source: Ambulatory Visit | Attending: Nurse Practitioner | Admitting: Nurse Practitioner

## 2020-12-26 ENCOUNTER — Other Ambulatory Visit: Payer: Self-pay

## 2020-12-26 DIAGNOSIS — R059 Cough, unspecified: Secondary | ICD-10-CM | POA: Diagnosis not present

## 2020-12-26 DIAGNOSIS — J9811 Atelectasis: Secondary | ICD-10-CM | POA: Diagnosis not present

## 2020-12-26 DIAGNOSIS — R062 Wheezing: Secondary | ICD-10-CM | POA: Diagnosis not present

## 2020-12-27 NOTE — Progress Notes (Signed)
X-ray did not give me much information. It says bronchitis vs asthma. You've been treated with abx for bronchitis, and albuterol for asthma. How are you feeling now? Are you still having symptoms?

## 2020-12-27 NOTE — Progress Notes (Signed)
She has had 2 different antibiotics already, so bronchitis is doubtful. If it is asthma, the albuterol should help. Keep the ENT referral, and if they don't find anything, we will consider a pulmonology referral.

## 2021-01-08 ENCOUNTER — Ambulatory Visit: Payer: PPO | Admitting: Orthopaedic Surgery

## 2021-01-15 ENCOUNTER — Ambulatory Visit (INDEPENDENT_AMBULATORY_CARE_PROVIDER_SITE_OTHER): Payer: PPO | Admitting: Orthopaedic Surgery

## 2021-01-15 ENCOUNTER — Encounter: Payer: Self-pay | Admitting: Orthopaedic Surgery

## 2021-01-15 ENCOUNTER — Other Ambulatory Visit: Payer: Self-pay

## 2021-01-15 VITALS — BP 140/84 | HR 74 | Ht <= 58 in | Wt 150.2 lb

## 2021-01-15 DIAGNOSIS — G8929 Other chronic pain: Secondary | ICD-10-CM

## 2021-01-15 DIAGNOSIS — M25562 Pain in left knee: Secondary | ICD-10-CM

## 2021-01-15 NOTE — Progress Notes (Signed)
My knee hurts more at times.  Her left knee has pain after doing a lot of walking or standing.  She said the Orthovisc did not help that much.  She is taking the Naprosyn at times but not regularly.  She says it makes her sleepy.  She uses Voltaren Gel and it helps.  She has no new trauma, no giving way.  Left knee has slight effusion, crepitus, stable, ROM 0 to 110, no limp today.  NV intact.  Encounter Diagnosis  Name Primary?   Chronic pain of left knee Yes   I will see her back in two months.  Call if any problem.  Precautions discussed.  Electronically Signed Sanjuana Kava, MD 7/19/20228:13 AM

## 2021-01-22 ENCOUNTER — Encounter: Payer: PPO | Admitting: Family Medicine

## 2021-01-22 ENCOUNTER — Ambulatory Visit (INDEPENDENT_AMBULATORY_CARE_PROVIDER_SITE_OTHER): Payer: PPO | Admitting: Otolaryngology

## 2021-01-22 ENCOUNTER — Other Ambulatory Visit: Payer: Self-pay

## 2021-01-22 DIAGNOSIS — M26609 Unspecified temporomandibular joint disorder, unspecified side: Secondary | ICD-10-CM

## 2021-01-22 DIAGNOSIS — H9203 Otalgia, bilateral: Secondary | ICD-10-CM

## 2021-01-22 DIAGNOSIS — J310 Chronic rhinitis: Secondary | ICD-10-CM

## 2021-01-22 DIAGNOSIS — R053 Chronic cough: Secondary | ICD-10-CM

## 2021-01-22 MED ORDER — AZELASTINE HCL 0.1 % NA SOLN: 1.0000 | Freq: Two times a day (BID) | NASAL | Status: DC

## 2021-01-22 MED ORDER — TRIAMCINOLONE ACETONIDE 55 MCG/ACT NA AERO: 2.0000 | INHALATION_SPRAY | Freq: Every day | NASAL | 12 refills | Status: DC

## 2021-01-22 NOTE — Progress Notes (Signed)
HPI: Kim Grimes is a 70 y.o. female who presents is referred by Dr. Pearline Cables for evaluation of chronic cough and ear pain.  She describes pain in both ears but worse on the right side.  She has not noted any drainage from the ear canals although she states that she has "drainage from her ears" that drains into her throat and makes her cough.  She has had a chronic cough for a couple months now.  She does have history of GE reflux disease and has previously been on omeprazole but this was changed to another antacid that she is presently taking.  She complains of a chronic cough throughout the day.  She has seen pulmonary and was diagnosed with asthma and is on an inhaler.  She denies sore throat or trouble swallowing.  She is hesitant about using any kind of nasal steroid spray.  The cough is generally nonproductive although she notices postnasal drainage. She describes a throbbing in her ears but no hearing problems..  Past Medical History:  Diagnosis Date   Anxiety disorder    Carotid artery stenosis    Chronic pelvic pain in female    Coronary artery disease    Endometriosis    GERD (gastroesophageal reflux disease)    Helicobacter pylori gastritis    Dr. Olevia Perches    Hiatal hernia 2003   on UGI    Hypothyroidism 2009   IBS (irritable bowel syndrome) 2003   Obesity (BMI 30-39.9) DEC 2011 145 LBS   Osteoporosis    PONV (postoperative nausea and vomiting)    Past Surgical History:  Procedure Laterality Date   ABDOMINAL HYSTERECTOMY     adhesiolysis     dense adhesion between rectum and sigmoid,  & pelvis    BRAVO PH STUDY  01/19/2012   Procedure: BRAVO [place];  Surgeon: [name], MD;  [facility];  Service: Endoscopy;;   CATARACT EXTRACTION W/PHACO Left 06/03/2019   Procedure: CATARACT EXTRACTION PHACO AND INTRAOCULAR LENS PLACEMENT LEFT EYE (CDE: 2.92);  Surgeon: [name], MD;  Location: [facility];  Service: Ophthalmology;  Laterality: Left;   COLONOSCOPY  03:  AP/D & 08: SCREENING   WNL'S   COLONOSCOPY  07/28/2011   sessile polyp in ascending colon/internal hemorrhoids. Next colonoscopy January 2018   COLONOSCOPY N/A 08/01/2016   Procedure: COLONOSCOPY;  Surgeon: [name], MD;  [facility]: [facility];  Service: Endoscopy;  Laterality: N/A;  930    ESOPHAGOGASTRODUODENOSCOPY  01/19/2012   SLF: SMALL Hiatal hernia/Mild gastritis   EYE SURGERY Right 10/18/2013   catarat extraction   LAPAROSCOPY     OLECRANON BURSECTOMY Left 07/01/2018   Procedure: LEFT ELBOW OLECRANON BURSECTOMY AND SPUR EXCISION;  Surgeon: [name], MD;  Location: [facility];  Service: Orthopedics;  Laterality: Left;   rt foot sx Bilateral    s/p hysterectomy     SALPINGOOPHORECTOMY     UPPER GASTROINTESTINAL ENDOSCOPY  DEC 2010: DYSPEPSIA/DYSPHAGIA   [place] RING/ESO DIL 16 MM, GASTRITIS/DUODENITIS 2o to Aleve/on OMP PRN   Social History   Socioeconomic History   Marital status: Divorced    Spouse name: Not on file   Number of children: Not on file   Years of education: Not on file   Highest education level: Not on file  Occupational History   Occupation: fulltime at [employer]   Tobacco Use   Smoking status: Never   Smokeless tobacco: Never   Tobacco comments:    Never  smoked  Vaping Use   Vaping Use: Never used  Substance and Sexual Activity   Alcohol use: Yes    Alcohol/week: 0.0 standard drinks    Comment: occasional, maybe once/year   Drug use: No   Sexual activity: Not Currently    Birth control/protection: None  Other Topics Concern   Not on file  Social History Narrative   Not on file   Social Determinants of Health   Financial Resource Strain: Not on file  Food Insecurity: Not on file  Transportation Needs: Not on file  Physical Activity: Not on file  Stress: Not on file  Social Connections: Not on file   Family History  Problem Relation Age of Onset   Prostate cancer Father    Colon cancer Father        < 53 YO    Cancer Father        prostate and colon   Pancreatic cancer Mother    Cancer Mother        pancreas   Diabetes Mother    Diabetes Brother    Cancer Brother        prostate    Diabetes Sister        prediabetic   Heart disease Brother        CHF   Thyroid disease Neg Hx    Allergies  Allergen Reactions   Aspirin Other (See Comments)    "blood rushing"   Diazepam Other (See Comments)    Aggitation   Prednisone Other (See Comments)    Any type of steroids. Pt states she has seen what they do to other people and she just doesn't take them   Prior to Admission medications   Medication Sig Start Date End Date Taking? Authorizing Provider  albuterol (VENTOLIN HFA) 108 (90 Base) MCG/ACT inhaler TAKE 2 PUFFS BY MOUTH EVERY 6 HOURS AS NEEDED FOR WHEEZE OR SHORTNESS OF BREATH 12/03/20   Noreene Larsson, NP  levothyroxine (SYNTHROID) 88 MCG tablet Take 1 tablet (88 mcg total) by mouth daily before breakfast. 07/14/20   Renato Shin, MD  naproxen (NAPROSYN) 500 MG tablet Take 1 tablet (500 mg total) by mouth 2 (two) times daily with a meal. 10/09/20   Sanjuana Kava, MD  pantoprazole (PROTONIX) 40 MG tablet Take 1 tablet (40 mg total) by mouth daily before breakfast. 10/10/20   Mahala Menghini, PA-C  promethazine-dextromethorphan (PROMETHAZINE-DM) 6.25-15 MG/5ML syrup Take 5 mLs by mouth 4 (four) times daily as needed for cough. 11/27/20   Noreene Larsson, NP  Vitamin D, Ergocalciferol, (DRISDOL) 1.25 MG (50000 UNIT) CAPS capsule TAKE 1 CAPSULE BY MOUTH ONE TIME PER WEEK 11/16/20   Fayrene Helper, MD     Positive ROS: Otherwise negative  All other systems have been reviewed and were otherwise negative with the exception of those mentioned in the HPI and as above.  Physical Exam: Constitutional: Alert, well-appearing, no acute distress Ears: External ears without lesions or tenderness. Ear canals are clear bilaterally with intact, clear TMs bilaterally.  On hearing screening with a 512 1024  tuning fork she heard well in both ears with AC > BC bilaterally.  No middle ear effusion noted on pneumatic otoscopy. Nasal: External nose without lesions. Septum slightly deviated to the right with moderate rhinitis.  After decongesting the nose both middle meatus regions were clear with no evidence of mucopurulent discharge.  No polyps noted.. Clear nasal passages Oral: Lips and gums without lesions. Tongue and palate mucosa  without lesions. Posterior oropharynx clear.  Tonsil regions appear benign bilaterally. Neck: No palpable adenopathy or masses.  No palpable adenopathy in the neck.  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  She has previously been prescribed a mouthguard by her dentist but she has difficulty using this at night because of her coughing. Respiratory: Breathing comfortably  Skin: No facial/neck lesions or rash noted.  Procedures  Assessment: Chronic cough questionable etiology. I suspect this may be related to reflux disease as well as chronic rhinitis with postnasal drainage which is clear. Ear pain seems to be more related to TMJ dysfunction.  No primary ear abnormality noted on clinical exam.  Plan: Recommended use of Nasacort 2 sprays each nostril at night as well as azelastine 1 spray twice daily as well as saline rinses for postnasal drainage.  This should help maximize the nasal airway and help some with postnasal drainage. Would recommend continuation with Protonix but would recommend taking this before dinner instead of first thing in the morning. If she continues to have chronic coughing consider further evaluation with pulmonary again.   Radene Journey, MD   CC:

## 2021-02-15 DIAGNOSIS — M85851 Other specified disorders of bone density and structure, right thigh: Secondary | ICD-10-CM | POA: Diagnosis not present

## 2021-02-15 DIAGNOSIS — Z1231 Encounter for screening mammogram for malignant neoplasm of breast: Secondary | ICD-10-CM | POA: Diagnosis not present

## 2021-02-15 DIAGNOSIS — Z780 Asymptomatic menopausal state: Secondary | ICD-10-CM | POA: Diagnosis not present

## 2021-02-15 DIAGNOSIS — M85852 Other specified disorders of bone density and structure, left thigh: Secondary | ICD-10-CM | POA: Diagnosis not present

## 2021-02-21 ENCOUNTER — Telehealth: Payer: Self-pay | Admitting: Family Medicine

## 2021-02-21 ENCOUNTER — Other Ambulatory Visit: Payer: Self-pay

## 2021-02-21 ENCOUNTER — Ambulatory Visit (INDEPENDENT_AMBULATORY_CARE_PROVIDER_SITE_OTHER): Payer: PPO

## 2021-02-21 DIAGNOSIS — Z0000 Encounter for general adult medical examination without abnormal findings: Secondary | ICD-10-CM | POA: Diagnosis not present

## 2021-02-21 NOTE — Progress Notes (Signed)
Subjective:   Kim Grimes is a 70 y.o. female who presents for Medicare Annual (Subsequent) preventive examination. I connected with  Kim Grimes on 02/21/21 by a audio enabled telemedicine application and verified that I am speaking with the correct person using two identifiers.   I discussed the limitations of evaluation and management by telemedicine. The patient expressed understanding and agreed to proceed.   Location of patient:Home   Location of Provider:Office  Persons participating in virtual visit: [name] (patient) and Edgar Frisk, CMA  Review of Systems    Defer to PCP       Objective:    There were no vitals filed for this visit. There is no height or weight on file to calculate BMI.  Advanced Directives 02/21/2021 05/31/2019 07/01/2018 06/22/2018 02/15/2018 08/01/2016 05/24/2014  Does Patient Have a Medical Advance Directive? Yes No No No No No No  Type of Advance Directive Living will;Healthcare Power of Attorney - - - - - -  Does patient want to make changes to medical advance directive? - - - - Yes (ED - Information included in AVS) - -  Would patient like information on creating a medical advance directive? - No - Patient declined No - Patient declined No - Patient declined - No - Patient declined No - patient declined information  Pre-existing out of facility DNR order (yellow form or pink MOST form) - - - - - - -    Current Medications (verified) Outpatient Encounter Medications as of 02/21/2021  Medication Sig   albuterol (VENTOLIN HFA) 108 (90 Base) MCG/ACT inhaler TAKE 2 PUFFS BY MOUTH EVERY 6 HOURS AS NEEDED FOR WHEEZE OR SHORTNESS OF BREATH   levothyroxine (SYNTHROID) 88 MCG tablet Take 1 tablet (88 mcg total) by mouth daily before breakfast.   naproxen (NAPROSYN) 500 MG tablet Take 1 tablet (500 mg total) by mouth 2 (two) times daily with a meal.   pantoprazole (PROTONIX) 40 MG tablet Take 1 tablet (40 mg total) by mouth daily before  breakfast.   triamcinolone (NASACORT) 55 MCG/ACT AERO nasal inhaler Place 2 sprays into the nose daily. 2 sprays at night   Vitamin D, Ergocalciferol, (DRISDOL) 1.25 MG (50000 UNIT) CAPS capsule TAKE 1 CAPSULE BY MOUTH ONE TIME PER WEEK   promethazine-dextromethorphan (PROMETHAZINE-DM) 6.25-15 MG/5ML syrup Take 5 mLs by mouth 4 (four) times daily as needed for cough. (Patient not taking: Reported on 02/21/2021)   Facility-Administered Encounter Medications as of 02/21/2021  Medication   azelastine (ASTELIN) 0.1 % nasal spray 1 spray    Allergies (verified) Aspirin, Diazepam, and Prednisone   History: Past Medical History:  Diagnosis Date   Anxiety disorder    Carotid artery stenosis    Chronic pelvic pain in female    Coronary artery disease    Endometriosis    GERD (gastroesophageal reflux disease)    Helicobacter pylori gastritis    Dr. Olevia Perches    Hiatal hernia 2003   on UGI    Hypothyroidism 2009   IBS (irritable bowel syndrome) 2003   Obesity (BMI 30-39.9) DEC 2011 145 LBS   Osteoporosis    PONV (postoperative nausea and vomiting)    Past Surgical History:  Procedure Laterality Date   ABDOMINAL HYSTERECTOMY     adhesiolysis     dense adhesion between rectum and sigmoid,  & pelvis    BRAVO PH STUDY  01/19/2012   Procedure: BRAVO [place];  Surgeon: [name], MD;  [facility];  Service:  Endoscopy;;   CATARACT EXTRACTION W/PHACO Left 06/03/2019   Procedure: CATARACT EXTRACTION PHACO AND INTRAOCULAR LENS PLACEMENT LEFT EYE (CDE: 2.92);  Surgeon: [name], MD;  Location[facility];  Service: Ophthalmology;  Laterality: Left;   COLONOSCOPY  03: AP/D & 08: SCREENING   WNL'S   COLONOSCOPY  07/28/2011   sessile polyp in ascending colon/internal hemorrhoids. Next colonoscopy January 2018   COLONOSCOPY N/A 08/01/2016   Procedure: COLONOSCOPY;  Surgeon: [name], MD;  [facility];  Service: Endoscopy;  Laterality: N/A;  930     ESOPHAGOGASTRODUODENOSCOPY  01/19/2012   SLF: SMALL Hiatal hernia/Mild gastritis   EYE SURGERY Right 10/18/2013   catarat extraction   LAPAROSCOPY     OLECRANON BURSECTOMY Left 07/01/2018   Procedure: LEFT ELBOW OLECRANON BURSECTOMY AND SPUR EXCISION;  Surgeon: [name], MD;  Location: [facility];  Service: Orthopedics;  Laterality: Left;   rt foot sx Bilateral    s/p hysterectomy     SALPINGOOPHORECTOMY     UPPER GASTROINTESTINAL ENDOSCOPY  DEC 2010: DYSPEPSIA/DYSPHAGIA   [place] RING/ESO DIL 16 MM, GASTRITIS/DUODENITIS 2o to Aleve/on OMP PRN   Family History  Problem Relation Age of Onset   Prostate cancer Father    Colon cancer Father        < 71 YO   Cancer Father        prostate and colon   Pancreatic cancer Mother    Cancer Mother        pancreas   Diabetes Mother    Diabetes Brother    Cancer Brother        prostate    Diabetes Sister        prediabetic   Heart disease Brother        CHF   Thyroid disease Neg Hx    Social History   Socioeconomic History   Marital status: Divorced    Spouse name: Not on file   Number of children: Not on file   Years of education: Not on file   Highest education level: Not on file  Occupational History   Occupation: fulltime at [employer]   Tobacco Use   Smoking status: Never   Smokeless tobacco: Never   Tobacco comments:    Never smoked  Vaping Use   Vaping Use: Never used  Substance and Sexual Activity   Alcohol use: Not Currently    Comment: occasional, maybe once/year   Drug use: No   Sexual activity: Not Currently    Birth control/protection: None  Other Topics Concern   Not on file  Social History Narrative   Not on file   Social Determinants of Health   Financial Resource Strain: Low Risk    Difficulty of Paying Living Expenses: Not hard at all  Food Insecurity: No Food Insecurity   Worried About Charity fundraiser in the Last Year: Never true   [place] in the Last Year: Never true   Transportation Needs: No Transportation Needs   Lack of Transportation (Medical): No   Lack of Transportation (Non-Medical): No  Physical Activity: Sufficiently Active   Days of Exercise per Week: 5 days   Minutes of Exercise per Session: 60 min  Stress: No Stress Concern Present   Feeling of Stress : Not at all  Social Connections: Moderately Integrated   Frequency of Communication with Friends and Family: Three times a week   Frequency of Social Gatherings with Friends and Family: Three times  a week   Attends Religious Services: More than 4 times per year   Active Member of Clubs or Organizations: Yes   Attends Music therapist: More than 4 times per year   Marital Status: Divorced    Tobacco Counseling Counseling given: Not Answered Tobacco comments: Never smoked   Clinical Intake:  Pre-visit preparation completed: Yes  Pain : No/denies pain     Nutritional Risks: None Diabetes: No  How often do you need to have someone help you when you read instructions, pamphlets, or other written materials from your doctor or pharmacy?: 1 - Never  Diabetic?NO  Interpreter Needed?: No  Information entered by :: Khandi Kernes J,CMA   Activities of Daily Living In your present state of health, do you have any difficulty performing the following activities: 02/21/2021  Hearing? N  Vision? N  Difficulty concentrating or making decisions? N  Walking or climbing stairs? N  Dressing or bathing? N  Doing errands, shopping? N  Preparing Food and eating ? N  Using the Toilet? N  In the past six months, have you accidently leaked urine? Y  Do you have problems with loss of bowel control? N  Managing your Medications? N  Managing your Finances? N  Housekeeping or managing your Housekeeping? N  Some recent data might be hidden    Patient Care Team: Fayrene Helper, MD as PCP - General (Family Medicine) [name], MD (Inactive) (Gastroenterology)  Indicate any  recent Medical Services you may have received from other than [facility] providers in the past year (date may be approximate).     Assessment:   This is a routine wellness examination for [place].  Hearing/Vision screen No results found.  Dietary issues and exercise activities discussed:     Goals Addressed   None    Depression Screen PHQ 2/9 Scores 02/21/2021 11/08/2020 07/25/2020 02/21/2020 06/09/2019 05/30/2019 04/27/2019  PHQ - 2 Score 0 0 0 3 0 0 0  PHQ- 9 Score - - - 4 - - -    Fall Risk Fall Risk  02/21/2021 11/08/2020 07/25/2020 02/21/2020 06/09/2019  Falls in the past year? 0 0 0 0 1  Number falls in past yr: 0 0 0 0 0  Injury with Fall? 0 0 0 0 1  Risk for fall due to : No Fall Risks No Fall Risks No Fall Risks - -  Follow up Falls evaluation completed Falls evaluation completed Falls evaluation completed Falls evaluation completed;Education provided;Falls prevention discussed -    FALL RISK PREVENTION PERTAINING TO THE HOME:  Any stairs in or around the home? No  If so, are there any without handrails? No  Home free of loose throw rugs in walkways, pet beds, electrical cords, etc? Yes  Adequate lighting in your home to reduce risk of falls? Yes   ASSISTIVE DEVICES UTILIZED TO PREVENT FALLS:  Life alert? No  Use of a cane, walker or w/c? No  Grab bars in the bathroom? No  Shower chair or bench in shower? No  Elevated toilet seat or a handicapped toilet? No   TIMED UP AND GO:  Was the test performed?  N/A .  Length of time to ambulate 10 feet: N/A sec.     Cognitive Function:     6CIT Screen 02/21/2021 02/21/2020 02/17/2019 02/15/2018  What Year? 0 points - 0 points 0 points  What month? 0 points - 0 points 0 points  What time? 0 points 0 points 0 points 0 points  Count back from 20 0 points 0 points 0 points 0 points  Months in reverse 0 points 0 points 0 points 2 points  Repeat phrase 4 points 0 points 0 points 2 points  Total Score 4 - 0 4     Immunizations Immunization History  Administered Date(s) Administered   Fluad Quad(high Dose 65+) 02/28/2019, 03/13/2020   Influenza, High Dose Seasonal PF 05/08/2017   Influenza,inj,Quad PF,6+ Mos 04/13/2013, 05/09/2014, 03/23/2015, 02/16/2018   Moderna Sars-Covid-2 Vaccination 08/13/2019, 09/10/2019, 04/21/2020, 02/04/2021   Pneumococcal Conjugate-13 03/15/2018   Pneumococcal Polysaccharide-23 03/22/2019   Tdap 09/18/2011   Zoster Recombinat (Shingrix) 07/25/2020   Zoster, Live 09/18/2011    TDAP status: Up to date  Flu Vaccine status: Due, Education has been provided regarding the importance of this vaccine. Advised may receive this vaccine at local pharmacy or Health Dept. Aware to provide a copy of the vaccination record if obtained from local pharmacy or Health Dept. Verbalized acceptance and understanding.  Pneumococcal vaccine status: Up to date  Covid-19 vaccine status: Completed vaccines  Qualifies for Shingles Vaccine? Yes   Zostavax completed  N/A   Shingrix Completed?: Yes  Screening Tests Health Maintenance  Topic Date Due   Zoster Vaccines- Shingrix (2 of 2) 09/19/2020   MAMMOGRAM  12/28/2020   INFLUENZA VACCINE  01/28/2021   TETANUS/TDAP  09/17/2021   COLONOSCOPY (Pts 45-53yr Insurance coverage will need to be confirmed)  08/01/2026   DEXA SCAN  Completed   COVID-19 Vaccine  Completed   Hepatitis C Screening  Completed   PNA vac Low Risk Adult  Completed   HPV VACCINES  Aged Out    Health Maintenance  Health Maintenance Due  Topic Date Due   Zoster Vaccines- Shingrix (2 of 2) 09/19/2020   MAMMOGRAM  12/28/2020   INFLUENZA VACCINE  01/28/2021    Colorectal cancer screening: Type of screening: Colonoscopy. Completed 08/01/2016. Repeat every 10 years  Mammogram status: Ordered 09/27/2020. Pt provided with contact info and advised to call to schedule appt. Patient has completed and awaiting results  Bone Density status: Ordered 09/27/2020. Pt  provided with contact info and advised to call to schedule appt. Patient has completed and awaiting results.   Lung Cancer Screening: (Low Dose CT Chest recommended if Age 69-80years, 30 pack-year currently smoking OR have quit w/in 15years.) does not qualify.   Lung Cancer Screening Referral: No  Additional Screening:  Hepatitis C Screening: does qualify; Completed 1/84/2021  Vision Screening: Recommended annual ophthalmology exams for early detection of glaucoma and other disorders of the eye. Is the patient up to date with their annual eye exam?  Yes  Who is the provider or what is the name of the office in which the patient attends annual eye exams? N/A If pt is not established with a provider, would they like to be referred to a provider to establish care? No .   Dental Screening: Recommended annual dental exams for proper oral hygiene  Community Resource Referral / Chronic Care Management: CRR required this visit?  No   CCM required this visit?  No      Plan:     I have personally reviewed and noted the following in the patient's chart:   Medical and social history Use of alcohol, tobacco or illicit drugs  Current medications and supplements including opioid prescriptions.  Functional ability and status Nutritional status Physical activity Advanced directives List of other physicians Hospitalizations, surgeries, and ER visits in previous 12 months Vitals Screenings to  include cognitive, depression, and falls Referrals and appointments  In addition, I have reviewed and discussed with patient certain preventive protocols, quality metrics, and best practice recommendations. A written personalized care plan for preventive services as well as general preventive health recommendations were provided to patient.     Edgar Frisk, [facility]   02/21/2021   Nurse Notes: Non Face to Face 30 minute visit Encounter    Ms. Knoche , Thank you for taking time to come for  your Medicare Wellness Visit. I appreciate your ongoing commitment to your health goals. Please review the following plan we discussed and let me know if I can assist you in the future.   These are the goals we discussed:  Goals   None     This is a list of the screening recommended for you and due dates:  Health Maintenance  Topic Date Due   Zoster (Shingles) Vaccine (2 of 2) 09/19/2020   Mammogram  12/28/2020   Flu Shot  01/28/2021   Tetanus Vaccine  09/17/2021   Colon Cancer Screening  08/01/2026   DEXA scan (bone density measurement)  Completed   COVID-19 Vaccine  Completed   Hepatitis C Screening: USPSTF Recommendation to screen - Ages 76-79 yo.  Completed   Pneumonia vaccines  Completed   HPV Vaccine  Aged Out

## 2021-02-21 NOTE — Patient Instructions (Signed)
Health Maintenance, Female Adopting a healthy lifestyle and getting preventive care are important in promoting health and wellness. Ask your health care provider about: The right schedule for you to have regular tests and exams. Things you can do on your own to prevent diseases and keep yourself healthy. What should I know about diet, weight, and exercise? Eat a healthy diet  Eat a diet that includes plenty of vegetables, fruits, low-fat dairy products, and lean protein. Do not eat a lot of foods that are high in solid fats, added sugars, or sodium.  Maintain a healthy weight Body mass index (BMI) is used to identify weight problems. It estimates body fat based on height and weight. Your health care provider can help determineyour BMI and help you achieve or maintain a healthy weight. Get regular exercise Get regular exercise. This is one of the most important things you can do for your health. Most adults should: Exercise for at least 150 minutes each week. The exercise should increase your heart rate and make you sweat (moderate-intensity exercise). Do strengthening exercises at least twice a week. This is in addition to the moderate-intensity exercise. Spend less time sitting. Even light physical activity can be beneficial. Watch cholesterol and blood lipids Have your blood tested for lipids and cholesterol at 69 years of age, then havethis test every 5 years. Have your cholesterol levels checked more often if: Your lipid or cholesterol levels are high. You are older than 69 years of age. You are at high risk for heart disease. What should I know about cancer screening? Depending on your health history and family history, you may need to have cancer screening at various ages. This may include screening for: Breast cancer. Cervical cancer. Colorectal cancer. Skin cancer. Lung cancer. What should I know about heart disease, diabetes, and high blood pressure? Blood pressure and heart  disease High blood pressure causes heart disease and increases the risk of stroke. This is more likely to develop in people who have high blood pressure readings, are of African descent, or are overweight. Have your blood pressure checked: Every 3-5 years if you are 18-39 years of age. Every year if you are 40 years old or older. Diabetes Have regular diabetes screenings. This checks your fasting blood sugar level. Have the screening done: Once every three years after age 40 if you are at a normal weight and have a low risk for diabetes. More often and at a younger age if you are overweight or have a high risk for diabetes. What should I know about preventing infection? Hepatitis B If you have a higher risk for hepatitis B, you should be screened for this virus. Talk with your health care provider to find out if you are at risk forhepatitis B infection. Hepatitis C Testing is recommended for: Everyone born from 1945 through 1965. Anyone with known risk factors for hepatitis C. Sexually transmitted infections (STIs) Get screened for STIs, including gonorrhea and chlamydia, if: You are sexually active and are younger than 69 years of age. You are older than 69 years of age and your health care provider tells you that you are at risk for this type of infection. Your sexual activity has changed since you were last screened, and you are at increased risk for chlamydia or gonorrhea. Ask your health care provider if you are at risk. Ask your health care provider about whether you are at high risk for HIV. Your health care provider may recommend a prescription medicine to help   prevent HIV infection. If you choose to take medicine to prevent HIV, you should first get tested for HIV. You should then be tested every 3 months for as long as you are taking the medicine. Pregnancy If you are about to stop having your period (premenopausal) and you may become pregnant, seek counseling before you get  pregnant. Take 400 to 800 micrograms (mcg) of folic acid every day if you become pregnant. Ask for birth control (contraception) if you want to prevent pregnancy. Osteoporosis and menopause Osteoporosis is a disease in which the bones lose minerals and strength with aging. This can result in bone fractures. If you are 65 years old or older, or if you are at risk for osteoporosis and fractures, ask your health care provider if you should: Be screened for bone loss. Take a calcium or vitamin D supplement to lower your risk of fractures. Be given hormone replacement therapy (HRT) to treat symptoms of menopause. Follow these instructions at home: Lifestyle Do not use any products that contain nicotine or tobacco, such as cigarettes, e-cigarettes, and chewing tobacco. If you need help quitting, ask your health care provider. Do not use street drugs. Do not share needles. Ask your health care provider for help if you need support or information about quitting drugs. Alcohol use Do not drink alcohol if: Your health care provider tells you not to drink. You are pregnant, may be pregnant, or are planning to become pregnant. If you drink alcohol: Limit how much you use to 0-1 drink a day. Limit intake if you are breastfeeding. Be aware of how much alcohol is in your drink. In the [place] one drink equals one 12 oz bottle of beer (355 mL), one 5 oz glass of wine (148 mL), or one 1 oz glass of hard liquor (44 mL). General instructions Schedule regular health, dental, and eye exams. Stay current with your vaccines. Tell your health care provider if: You often feel depressed. You have ever been abused or do not feel safe at home. Summary Adopting a healthy lifestyle and getting preventive care are important in promoting health and wellness. Follow your health care provider's instructions about healthy diet, exercising, and getting tested or screened for diseases. Follow your health care provider's  instructions on monitoring your cholesterol and blood pressure. This information is not intended to replace advice given to you by your health care provider. Make sure you discuss any questions you have with your healthcare provider. Document Revised: 06/09/2018 Document Reviewed: 06/09/2018 Elsevier Patient Education  2022 Elsevier Inc.  

## 2021-02-21 NOTE — Telephone Encounter (Signed)
Patient came in and wants to know the results of her bone density test and mammogram.   Please call her back

## 2021-02-22 NOTE — Telephone Encounter (Signed)
I don't see any results. I see they were ordered earlier in the year by Dr Garwin Brothers- maybe she has the results

## 2021-02-22 NOTE — Telephone Encounter (Signed)
Called patient and left voicemail.

## 2021-02-25 ENCOUNTER — Ambulatory Visit (INDEPENDENT_AMBULATORY_CARE_PROVIDER_SITE_OTHER): Payer: PPO

## 2021-02-25 ENCOUNTER — Other Ambulatory Visit: Payer: Self-pay

## 2021-02-25 DIAGNOSIS — Z23 Encounter for immunization: Secondary | ICD-10-CM

## 2021-03-05 ENCOUNTER — Ambulatory Visit (INDEPENDENT_AMBULATORY_CARE_PROVIDER_SITE_OTHER): Payer: PPO | Admitting: Family Medicine

## 2021-03-05 ENCOUNTER — Other Ambulatory Visit: Payer: Self-pay

## 2021-03-05 ENCOUNTER — Encounter: Payer: Self-pay | Admitting: Family Medicine

## 2021-03-05 VITALS — BP 121/77 | HR 79 | Temp 98.6°F | Resp 18 | Ht <= 58 in | Wt 152.0 lb

## 2021-03-05 DIAGNOSIS — J3089 Other allergic rhinitis: Secondary | ICD-10-CM | POA: Diagnosis not present

## 2021-03-05 DIAGNOSIS — R7989 Other specified abnormal findings of blood chemistry: Secondary | ICD-10-CM

## 2021-03-05 DIAGNOSIS — K219 Gastro-esophageal reflux disease without esophagitis: Secondary | ICD-10-CM | POA: Diagnosis not present

## 2021-03-05 DIAGNOSIS — E039 Hypothyroidism, unspecified: Secondary | ICD-10-CM

## 2021-03-05 DIAGNOSIS — E559 Vitamin D deficiency, unspecified: Secondary | ICD-10-CM

## 2021-03-05 DIAGNOSIS — R059 Cough, unspecified: Secondary | ICD-10-CM | POA: Diagnosis not present

## 2021-03-05 MED ORDER — PROMETHAZINE-DM 6.25-15 MG/5ML PO SYRP: ORAL_SOLUTION | ORAL | 0 refills | Status: DC

## 2021-03-05 MED ORDER — OMEPRAZOLE 40 MG PO CPDR: 40.0000 mg | DELAYED_RELEASE_CAPSULE | Freq: Every day | ORAL | 2 refills | Status: DC

## 2021-03-05 MED ORDER — BENZONATATE 100 MG PO CAPS: 100.0000 mg | ORAL_CAPSULE | Freq: Two times a day (BID) | ORAL | 0 refills | Status: DC | PRN

## 2021-03-05 MED ORDER — MONTELUKAST SODIUM 10 MG PO TABS: 10.0000 mg | ORAL_TABLET | Freq: Every day | ORAL | 3 refills | Status: DC

## 2021-03-05 NOTE — Assessment & Plan Note (Signed)
Adequately corrected, start oTC vit D 3 per gyne and d/ weekly supplement on completion of what she has , advised her to take the med very 2 weeks instead of weekly till done

## 2021-03-05 NOTE — Assessment & Plan Note (Signed)
Increase omeprazole dose short term avoid caffeine and late eating

## 2021-03-05 NOTE — Progress Notes (Signed)
   Kim Grimes     MRN: [id number]      DOB: 1951-01-23   HPI [name] is here for follow up and re-evaluation of chronic medical conditions, medication management and review of any available recent lab and radiology data.  Preventive health is updated, specifically  Cancer screening and Immunization.    The PT denies any adverse reactions to current medications since the last visit.  4 month h/o cough, wheeze, sputum is clear, experiences post nasal drainage, and has changed her PPI from protonix to omeprazole in the hope that her symptoms would improve with little change Has had ENT eval and essentially was assred that there is no sifgnificant problem there   Has had her annual exam with Gyne per routine  ROS . Denies chest pains, palpitations and leg swelling Denies abdominal pain, nausea, vomiting,diarrhea or constipation.   Denies dysuria, frequency, hesitancy or incontinence. Denies uncontrolled joint pain, swelling and limitation in mobility. Denies headaches, seizures, numbness, or tingling. Denies depression, anxiety or insomnia. Denies skin break down or rash.   PE  BP 121/77   Pulse 79   Temp 98.6 F (37 C)   Resp 18   Ht '4\' 10"'$  (1.473 m)   Wt 152 lb 0.6 oz (69 kg)   SpO2 98%   BMI 31.78 kg/m   Patient alert and oriented and in no cardiopulmonary distress.  HEENT: No facial asymmetry, EOMI,     Neck supple .  Chest: Clear to auscultation bilaterally.  CVS: S1, S2 no murmurs, no S3.Regular rate.  ABD: Soft non tender.   Ext: No edema  MS: Adequate ROM spine, shoulders, hips and educed in left  knee.  Skin: Intact, no ulcerations or rash noted.  Psych: Good eye contact, normal affect. Memory intact not anxious or depressed appearing.  CNS: CN 2-12 intact, power,  normal throughout.no focal deficits noted.   Assessment & Plan  Cough 4 month h/o cough, no change despite recent med changes, will start singulair, increase protonix dose,  short course of tesslon perles and refer to Pulmonary  Allergic rhinitis Likely a part of the trigger for the cough, add daily singulair and advised daily use of astelin which she has not been doing  GERD (gastroesophageal reflux disease) Increase omeprazole dose short term avoid caffeine and late eating  Hypothyroidism Controlled and managed by Endo  High serum low-density lipoprotein (LDL) Hyperlipidemia:Low fat diet discussed and encouraged.   Lipid Panel  Lab Results  Component Value Date   CHOL 192 07/31/2020   HDL 56 07/31/2020   LDLCALC 120 (H) 07/31/2020   TRIG 89 07/31/2020   CHOLHDL 3.2 07/04/2019      needs to reduce fat intake  Vitamin D deficiency Adequately corrected, start oTC vit D 3 per gyne and d/ weekly supplement on completion of what she has , advised her to take the med very 2 weeks instead of weekly till done

## 2021-03-05 NOTE — Assessment & Plan Note (Signed)
4 month h/o cough, no change despite recent med changes, will start singulair, increase protonix dose, short course of tesslon perles and refer to Pulmonary

## 2021-03-05 NOTE — Assessment & Plan Note (Signed)
Controlled and managed by Endo 

## 2021-03-05 NOTE — Assessment & Plan Note (Signed)
Likely a part of the trigger for the cough, add daily singulair and advised daily use of astelin which she has not been doing

## 2021-03-05 NOTE — Patient Instructions (Signed)
F/U in 4 months, re evaluate cough, call if you need me sooner  New for cough montelukast, higher dose of omeprazole and short term tessalon perles, also bed time phenergan dM syrup, as needed  You are referred to Pulmonary Specialist for further evaluation also   Please get shingrix 2 as soon as possible  On completion of current once weekly vit D , take it every 2 weeks, start OTC vit D 3 as recommended by Dr cousins  Thanks for choosing [facility], we consider it a privelige to serve you.

## 2021-03-05 NOTE — Assessment & Plan Note (Signed)
Hyperlipidemia:Low fat diet discussed and encouraged.   Lipid Panel  Lab Results  Component Value Date   CHOL 192 07/31/2020   HDL 56 07/31/2020   LDLCALC 120 (H) 07/31/2020   TRIG 89 07/31/2020   CHOLHDL 3.2 07/04/2019      needs to reduce fat intake

## 2021-03-19 ENCOUNTER — Ambulatory Visit: Payer: PPO | Admitting: Orthopaedic Surgery

## 2021-03-21 DIAGNOSIS — H01005 Unspecified blepharitis left lower eyelid: Secondary | ICD-10-CM | POA: Diagnosis not present

## 2021-03-21 DIAGNOSIS — H01004 Unspecified blepharitis left upper eyelid: Secondary | ICD-10-CM | POA: Diagnosis not present

## 2021-03-21 DIAGNOSIS — H40023 Open angle with borderline findings, high risk, bilateral: Secondary | ICD-10-CM | POA: Diagnosis not present

## 2021-03-21 DIAGNOSIS — H01001 Unspecified blepharitis right upper eyelid: Secondary | ICD-10-CM | POA: Diagnosis not present

## 2021-03-21 DIAGNOSIS — H01002 Unspecified blepharitis right lower eyelid: Secondary | ICD-10-CM | POA: Diagnosis not present

## 2021-05-02 ENCOUNTER — Other Ambulatory Visit: Payer: Self-pay

## 2021-05-02 ENCOUNTER — Ambulatory Visit (INDEPENDENT_AMBULATORY_CARE_PROVIDER_SITE_OTHER): Payer: PPO | Admitting: Internal Medicine

## 2021-05-02 ENCOUNTER — Encounter: Payer: Self-pay | Admitting: Internal Medicine

## 2021-05-02 DIAGNOSIS — R053 Chronic cough: Secondary | ICD-10-CM

## 2021-05-02 MED ORDER — BENZONATATE 200 MG PO CAPS: 200.0000 mg | ORAL_CAPSULE | Freq: Three times a day (TID) | ORAL | 2 refills | Status: DC | PRN

## 2021-05-02 MED ORDER — OMEPRAZOLE 40 MG PO CPDR: DELAYED_RELEASE_CAPSULE | ORAL | 2 refills | Status: DC

## 2021-05-02 NOTE — Assessment & Plan Note (Addendum)
Onset spring 2022  - Allergy profile 05/02/2021 >  Eos 0. /  IgE   - 05/02/2021 rec ppi bid ac and diet/ bed blocks and f/u @ 6 weeks    The most common causes of chronic cough in immunocompetent adults include the following: upper airway cough syndrome (UACS), previously referred to as postnasal drip syndrome (PNDS), which is caused by variety of rhinosinus conditions; (2) asthma; (3) GERD; (4) chronic bronchitis from cigarette smoking or other inhaled environmental irritants; (5) nonasthmatic eosinophilic bronchitis; and (6) bronchiectasis.   These conditions, singly or in combination, have accounted for up to 94% of the causes of chronic cough in prospective studies.   Other conditions have constituted no >6% of the causes in prospective studies These have included bronchogenic carcinoma, chronic interstitial pneumonia, sarcoidosis, left ventricular failure, ACEI-induced cough, and aspiration from a condition associated with pharyngeal dysfunction.    Chronic cough is often simultaneously caused by more than one condition. A single cause has been found from 38 to 82% of the time, multiple causes from 18 to 62%. Multiply caused cough has been the result of three diseases up to 42% of the time.    NB Of the three most common causes of  Sub-acute / recurrent or chronic cough, only one (GERD)  can actually contribute to/ trigger  the other two (asthma and post nasal drip syndrome)  and perpetuate the cylce of cough.  While not intuitively obvious, many patients with chronic low grade reflux do not cough until there is a primary insult that disturbs the protective epithelial barrier and exposes sensitive nerve endings.   This is typically viral but can due to PNDS and  either may apply here.     >>> The point is that once this occurs, it is difficult to eliminate the cycle  using anything but a maximally effective acid suppression regimen at least in the short run, accompanied by an appropriate diet  to address non acid GERD and control / eliminate any pnds with 1st gen H1 blockers per guidelines , esp at hs, and then eliminate cough itself for at least 3 days with tessalon and if needed longterm = gabapentin trial unless allergy screen is impressive  Advised: The standardized cough guidelines published in Chest by Lissa Morales in 2006 are still the best available and consist of a multiple step process (up to 12!) , not a single office visit,  and are intended  to address this problem logically,  with an alogrithm dependent on response to empiric treatment at  each progressive step  to determine a specific diagnosis with  minimal addtional testing needed. Therefore if adherence is an issue or can't be accurately verified,  it's very unlikely the standard evaluation and treatment will be successful here.    Furthermore, response to therapy (other than acute cough suppression, which should only be used short term with avoidance of narcotic containing cough syrups if possible), can be a gradual process for which the patient is not likely to  perceive immediate benefit.  Unlike going to an eye doctor where the best perscription is almost always the first one and is immediately effective, this is almost never the case in the management of chronic cough syndromes. Therefore the patient needs to commit up front to consistently adhere to recommendations  for up to 6 weeks of therapy directed at the likely underlying problem(s) before the response can be reasonably evaluated.   >>> f/u in 6 weeks  Each maintenance medication was reviewed in detail including emphasizing most importantly the difference between maintenance and prns and under what circumstances the prns are to be triggered using an action plan format where appropriate.  Total time for H and P, chart review, counseling,   and generating customized AVS unique to this office visit / same day charting > 45 min

## 2021-05-02 NOTE — Progress Notes (Signed)
Kim Grimes, female    DOB: April 08, 1951,  MRN: [id number]   Brief patient profile:  55 yobf never smoker says was told she was "blue baby" but no obvious sequelae then in her 55s p working p started working [employer] rx x 3 months = pnds/ nasal congestion with watery nasal d/c seemed to worse Dec / June  then onset of cough/wheeze in May 2022  referred to pulmonary clinic in [place]  05/02/2021 by Dr  Kim Grimes for cough with previous w/u by Dr Kim Grimes with neg  w/u   Onset obvious hb and multiple EGD's last 01/19/12 with HH  >  omeprazole says pt wanted to change to protonix spring 2022  but w/in a month p that started with cough / wheeze so resumed omeprazole seemed to help some but only takes one 40 mg at bedtime and still coughing,esp at hs   Notes refuses steroid rx of any kind as "they've heard what they do" but has tried nasacort AQ in past and admits may have helped some.  History of Present Illness  05/02/2021  Pulmonary/ 1st office eval/ Kim Grimes / [facility]imes [facility]  Chief Complaint  Patient presents with   Consult    Wheezing, no SOB, no chest tightness, persistent cough with clear mucus and ear drainage.   CXR- 7/22 done at [place] per pt showed she has asthma   Dyspnea:  walks daily one mile  15-20 min flat no problem  Cough: mostly dry / mostly p midnight from sound sleep 3 nights a week tessalon helps Sleep: better p tessalon/ bed is flat  2 pillows  SABA use: ventolin seems to help at work only using every few days though  No obvious patterns in day to day or daytime variability or assoc excess/ purulent sputum or mucus plugs or hemoptysis or cp or chest tightness, subjective wheeze or overt sinus or hb symptoms.      Also denies any obvious fluctuation of symptoms with weather or environmental changes or other aggravating or alleviating factors except as outlined above   No unusual exposure hx or h/o childhood pna/ asthma or knowledge of premature  birth.  Current Allergies, Complete Past Medical History, Past Surgical History, Family History, and Social History were reviewed in [employer] record.  ROS  The following are not active complaints unless bolded Hoarseness, sore throat, dysphagia, dental problems, itching, sneezing,  nasal congestion or discharge of excess mucus or purulent secretions, ear ache,   fever, chills, sweats, unintended wt loss or wt gain, classically pleuritic or exertional cp,  orthopnea pnd or arm/hand swelling  or leg swelling, presyncope, palpitations, abdominal pain, anorexia, nausea, vomiting, diarrhea  or change in bowel habits or change in bladder habits, change in stools or change in urine, dysuria, hematuria,  rash, arthralgias, visual complaints, headache, numbness, weakness or ataxia or problems with walking or coordination,  change in mood or  memory.           .  Past Medical History:  Diagnosis Date   Anxiety disorder    Carotid artery stenosis    Chronic pelvic pain in female    Coronary artery disease    Endometriosis    GERD (gastroesophageal reflux disease)    Helicobacter pylori gastritis    Dr. Olevia Grimes    Hiatal hernia 2003   on UGI    Hypothyroidism 2009   IBS (irritable bowel syndrome) 2003   Obesity (BMI 30-39.9) DEC 2011 145  LBS   Osteoporosis    PONV (postoperative nausea and vomiting)     Outpatient Medications Prior to Visit  Medication Sig Dispense Refill   albuterol (VENTOLIN HFA) 108 (90 Base) MCG/ACT inhaler TAKE 2 PUFFS BY MOUTH EVERY 6 HOURS AS NEEDED FOR WHEEZE OR SHORTNESS OF BREATH 8.5 each 5   benzonatate (TESSALON) 100 MG capsule Take 1 capsule (100 mg total) by mouth 2 (two) times daily as needed for cough. 20 capsule 0   levothyroxine (SYNTHROID) 88 MCG tablet Take 1 tablet (88 mcg total) by mouth daily before breakfast. 90 tablet 3   montelukast (SINGULAIR) 10 MG tablet Take 1 tablet (10 mg total) by mouth at bedtime. 30 tablet 3    naproxen (NAPROSYN) 500 MG tablet Take 1 tablet (500 mg total) by mouth 2 (two) times daily with a meal. 60 tablet 5   omeprazole (PRILOSEC) 40 MG capsule Take 1 capsule (40 mg total) by mouth daily. 30 capsule 2   promethazine-dextromethorphan (PROMETHAZINE-DM) 6.25-15 MG/5ML syrup Take one teaspoon at bedtime , as needed, for cough 240 mL 0   Facility-Administered Medications Prior to Visit  Medication Dose Route Frequency Provider Last Rate Last Admin   azelastine (ASTELIN) 0.1 % nasal spray 1 spray  1 spray Each Nare BID Kim Nunnery, MD         Objective:     BP 128/78 (BP Location: Left Arm, Patient Position: Sitting)   Pulse 74   Temp 98.7 F (37.1 C)   Ht 4\' 10"  (1.473 m)   Wt 154 lb 0.6 oz (69.9 kg)   SpO2 99% Comment: ra  BMI 32.19 kg/m   SpO2: 99 % (ra)  Amb bf harsh dry quality with upper airway features  HEENT : pt wearing mask not removed for exam due to covid -19 concerns.    NECK :  without JVD/Nodes/TM/ nl carotid upstrokes bilaterally   LUNGS: no acc muscle use,  Nl contour chest which is clear to A and P bilaterally without cough on insp or exp maneuvers   CV:  RRR  no s3 or murmur or increase in P2, and no edema   ABD:  soft and nontender with nl inspiratory excursion in the supine position. No bruits or organomegaly appreciated, bowel sounds nl  MS:  Nl gait/ ext warm without deformities, calf tenderness, cyanosis or clubbing No obvious joint restrictions   SKIN: warm and dry without lesions    NEURO:  alert, approp, nl sensorium with  no motor or cerebellar deficits apparent.       I personally reviewed images and agree with radiology impression as follows:  CXR:   Pa and lat 12/26/20 Mild bronchial thickening may represent bronchitis or asthma. Subsegmental atelectasis in the lingula. No pneumonia       Assessment   No problem-specific Assessment & Plan notes found for this encounter.     Kim Gully, MD 05/02/2021

## 2021-05-02 NOTE — Patient Instructions (Addendum)
Please remember to go to the lab department   for your tests - we will call you with the results when they are available.     Omeprazole 40 mg Take 30- 60 min before your first and last meals of the day   For drainage / throat tickle try take CHLORPHENIRAMINE  4 mg   take one every 4 hours as needed - available over the counter- may cause drowsiness so start with just a dose or two an hour before bedtime and see how you tolerate it before trying in daytime    For cough > tessalon 200 mg every 6-8 hour with the goal to stop all coughing for at least 3 days  For wheeze >  albuterol 2 puffs every 4 hours if needed   GERD (REFLUX)  is an extremely common cause of respiratory symptoms just like yours , many times with no obvious heartburn at all.    It can be treated with medication, but also with lifestyle changes including elevation of the head of your bed (ideally with 6-8inch blocks under the headboard of your bed),  Smoking cessation, avoidance of late meals, excessive alcohol, and avoid fatty foods, chocolate, peppermint, colas, red wine, and acidic juices such as orange juice.  NO MINT OR MENTHOL PRODUCTS SO NO COUGH DROPS  USE SUGARLESS CANDY INSTEAD (Jolley ranchers or Stover's or Life Savers) or even ice chips will also do - the key is to swallow to prevent all throat clearing. NO OIL BASED VITAMINS - use powdered substitutes.  Avoid fish oil when coughing.    Please schedule a follow up office visit in 6 weeks, call sooner if needed

## 2021-05-03 ENCOUNTER — Ambulatory Visit: Payer: PPO | Admitting: Endocrinology

## 2021-05-03 VITALS — BP 100/60 | HR 93 | Ht <= 58 in | Wt 155.4 lb

## 2021-05-03 DIAGNOSIS — E039 Hypothyroidism, unspecified: Secondary | ICD-10-CM

## 2021-05-03 NOTE — Progress Notes (Signed)
Subjective:    Patient ID: Kim Grimes, female    DOB: Apr 23, 1951, 69 y.o.   MRN: [id number]  HPI Pt returns for f/u of hypothyroidism (dx'ed 2011; she has been on prescribed thyroid hormone therapy since dx; she has never had thyroid imaging; synthroid dosage has varied from 75-125 mcg/d; TFT have varied, even with minimal changes in dosage).  pt states she feels well in general.   Past Medical History:  Diagnosis Date   Anxiety disorder    Carotid artery stenosis    Chronic pelvic pain in female    Coronary artery disease    Endometriosis    GERD (gastroesophageal reflux disease)    Helicobacter pylori gastritis    Dr. Olevia Perches    Hiatal hernia 2003   on UGI    Hypothyroidism 2009   IBS (irritable bowel syndrome) 2003   Obesity (BMI 30-39.9) DEC 2011 145 LBS   Osteoporosis    PONV (postoperative nausea and vomiting)     Past Surgical History:  Procedure Laterality Date   ABDOMINAL HYSTERECTOMY     adhesiolysis     dense adhesion between rectum and sigmoid,  & pelvis    BRAVO PH STUDY  01/19/2012   Procedure: BRAVO [place];  Surgeon: [name], MD;  Location: [facility];  Service: Endoscopy;;   CATARACT EXTRACTION W/PHACO Left 06/03/2019   Procedure: CATARACT EXTRACTION PHACO AND INTRAOCULAR LENS PLACEMENT LEFT EYE (CDE: 2.92);  Surgeon: [name], MD;  Location: [facility];  Service: Ophthalmology;  Laterality: Left;   COLONOSCOPY  03: [facility]/D & 08: SCREENING   WNL'S   COLONOSCOPY  07/28/2011   sessile polyp in ascending colon/internal hemorrhoids. Next colonoscopy January 2018   COLONOSCOPY N/A 08/01/2016   Procedure: COLONOSCOPY;  Surgeon: [name], MD;  Location: [facility];  Service: Endoscopy;  Laterality: N/A;  930    ESOPHAGOGASTRODUODENOSCOPY  01/19/2012   SLF: SMALL Hiatal hernia/Mild gastritis   EYE SURGERY Right 10/18/2013   catarat extraction   LAPAROSCOPY     OLECRANON BURSECTOMY Left 07/01/2018   Procedure: LEFT ELBOW OLECRANON BURSECTOMY  AND SPUR EXCISION;  Surgeon: [name], MD;  Location: [facility];  Service: Orthopedics;  Laterality: Left;   rt foot sx Bilateral    s/p hysterectomy     SALPINGOOPHORECTOMY     UPPER GASTROINTESTINAL ENDOSCOPY  DEC 2010: DYSPEPSIA/DYSPHAGIA   [place] RING/ESO DIL 16 MM, GASTRITIS/DUODENITIS 2o to Aleve/on OMP PRN    Social History   Socioeconomic History   Marital status: Divorced    Spouse name: Not on file   Number of children: Not on file   Years of education: Not on file   Highest education level: Not on file  Occupational History   Occupation: fulltime at [employer]   Tobacco Use   Smoking status: Never   Smokeless tobacco: Never   Tobacco comments:    Never smoked  Vaping Use   Vaping Use: Never used  Substance and Sexual Activity   Alcohol use: Not Currently    Comment: occasional, maybe once/year   Drug use: No   Sexual activity: Not Currently    Birth control/protection: None  Other Topics Concern   Not on file  Social History Narrative   Not on file   Social Determinants of Health   Financial Resource Strain: Low Risk    Difficulty of Paying Living Expenses: Not hard at all  Food Insecurity: No Food Insecurity   Worried About  Running Out of Food in the Last Year: Never true   Ran Out of Food in the Last Year: Never true  Transportation Needs: No Transportation Needs   Lack of Transportation (Medical): No   Lack of Transportation (Non-Medical): No  Physical Activity: Sufficiently Active   Days of Exercise per Week: 5 days   Minutes of Exercise per Session: 60 min  Stress: No Stress Concern Present   Feeling of Stress : Not at all  Social Connections: Moderately Integrated   Frequency of Communication with Friends and Family: Three times a week   Frequency of Social Gatherings with Friends and Family: Three times a week   Attends Religious Services: More than 4 times per year   Active Member of Clubs or Organizations: Yes   Attends  Music therapist: More than 4 times per year   Marital Status: Divorced  Human resources officer Violence: Not At Risk   Fear of Current or Ex-Partner: No   Emotionally Abused: No   Physically Abused: No   Sexually Abused: No    Current Outpatient Medications on File Prior to Visit  Medication Sig Dispense Refill   albuterol (VENTOLIN HFA) 108 (90 Base) MCG/ACT inhaler TAKE 2 PUFFS BY MOUTH EVERY 6 HOURS AS NEEDED FOR WHEEZE OR SHORTNESS OF BREATH 8.5 each 5   benzonatate (TESSALON) 200 MG capsule Take 1 capsule (200 mg total) by mouth 3 (three) times daily as needed for cough. 45 capsule 2   levothyroxine (SYNTHROID) 88 MCG tablet Take 1 tablet (88 mcg total) by mouth daily before breakfast. 90 tablet 3   montelukast (SINGULAIR) 10 MG tablet Take 1 tablet (10 mg total) by mouth at bedtime. 30 tablet 3   naproxen (NAPROSYN) 500 MG tablet Take 1 tablet (500 mg total) by mouth 2 (two) times daily with a meal. 60 tablet 5   omeprazole (PRILOSEC) 40 MG capsule Take 30- 60 min before your first and last meals of the day 60 capsule 2   promethazine-dextromethorphan (PROMETHAZINE-DM) 6.25-15 MG/5ML syrup Take one teaspoon at bedtime , as needed, for cough 240 mL 0   Current Facility-Administered Medications on File Prior to Visit  Medication Dose Route Frequency Provider Last Rate Last Admin   azelastine (ASTELIN) 0.1 % nasal spray 1 spray  1 spray Each Nare BID Rozetta Nunnery, MD        Allergies  Allergen Reactions   Aspirin Other (See Comments)    "blood rushing"   Diazepam Other (See Comments)    Aggitation   Prednisone Other (See Comments)    Any type of steroids. Pt states she has seen what they do to other people and she just doesn't take them    Family History  Problem Relation Age of Onset   Prostate cancer Father    Colon cancer Father        < 71 YO   Cancer Father        prostate and colon   Pancreatic cancer Mother    Cancer Mother        pancreas    Diabetes Mother    Diabetes Brother    Cancer Brother        prostate    Diabetes Sister        prediabetic   Heart disease Brother        CHF   Thyroid disease Neg Hx     BP 100/60 (BP Location: Right Arm, Patient Position: Sitting, Cuff Size: Normal)  Pulse 93   Ht 4\' 10"  (1.473 m)   Wt 155 lb 6.4 oz (70.5 kg)   SpO2 97%   BMI 32.48 kg/m    Review of Systems     Objective:   Physical Exam NECK: There is no palpable thyroid enlargement.  No thyroid nodule is palpable.  No palpable lymphadenopathy at the anterior neck.   Lab Results  Component Value Date   TSH 3.70 05/03/2021      Assessment & Plan:  Hypothyroidism: well-controlled.   Please continue the same synthroid.

## 2021-05-03 NOTE — Patient Instructions (Signed)
Blood tests are requested for you today.  We'll let you know about the results.  It is best to never miss the medication.  However, if you do miss it, next best is to double up the next time.    Please come back for a follow-up appointment in 6 months.

## 2021-05-04 LAB — T4, FREE: Free T4: 1.2 ng/dL (ref 0.8–1.8)

## 2021-05-04 LAB — TSH: TSH: 3.7 mIU/L (ref 0.40–4.50)

## 2021-05-09 LAB — CBC WITH DIFFERENTIAL/PLATELET
Basophils Absolute: 0.1 10*3/uL (ref 0.0–0.2)
Basos: 1 %
EOS (ABSOLUTE): 0.3 10*3/uL (ref 0.0–0.4)
Eos: 5 %
Hematocrit: 41.2 % (ref 34.0–46.6)
Hemoglobin: 13.6 g/dL (ref 11.1–15.9)
Immature Grans (Abs): 0 10*3/uL (ref 0.0–0.1)
Immature Granulocytes: 0 %
Lymphocytes Absolute: 1.8 10*3/uL (ref 0.7–3.1)
Lymphs: 35 %
MCH: 28.6 pg (ref 26.6–33.0)
MCHC: 33 g/dL (ref 31.5–35.7)
MCV: 87 fL (ref 79–97)
Monocytes Absolute: 0.5 10*3/uL (ref 0.1–0.9)
Monocytes: 9 %
Neutrophils Absolute: 2.6 10*3/uL (ref 1.4–7.0)
Neutrophils: 50 %
Platelets: 259 10*3/uL (ref 150–450)
RBC: 4.75 x10E6/uL (ref 3.77–5.28)
RDW: 13 % (ref 11.7–15.4)
WBC: 5.2 10*3/uL (ref 3.4–10.8)

## 2021-05-09 LAB — IGE: IgE (Immunoglobulin E), Serum: 7 IU/mL (ref 6–495)

## 2021-05-19 ENCOUNTER — Other Ambulatory Visit: Payer: Self-pay | Admitting: Family Medicine

## 2021-06-03 DIAGNOSIS — H40013 Open angle with borderline findings, low risk, bilateral: Secondary | ICD-10-CM | POA: Diagnosis not present

## 2021-06-03 DIAGNOSIS — H524 Presbyopia: Secondary | ICD-10-CM | POA: Diagnosis not present

## 2021-06-06 ENCOUNTER — Other Ambulatory Visit: Payer: Self-pay | Admitting: Family Medicine

## 2021-06-14 ENCOUNTER — Ambulatory Visit: Payer: PPO | Admitting: Internal Medicine

## 2021-07-05 ENCOUNTER — Other Ambulatory Visit: Payer: Self-pay | Admitting: Family Medicine

## 2021-07-05 ENCOUNTER — Ambulatory Visit: Payer: PPO | Admitting: Family Medicine

## 2021-07-31 ENCOUNTER — Encounter: Payer: Self-pay | Admitting: Internal Medicine

## 2021-08-04 ENCOUNTER — Other Ambulatory Visit: Payer: Self-pay | Admitting: Family Medicine

## 2021-08-06 ENCOUNTER — Encounter: Payer: Self-pay | Admitting: *Deleted

## 2021-08-15 DIAGNOSIS — H01001 Unspecified blepharitis right upper eyelid: Secondary | ICD-10-CM | POA: Diagnosis not present

## 2021-08-15 DIAGNOSIS — H02834 Dermatochalasis of left upper eyelid: Secondary | ICD-10-CM | POA: Diagnosis not present

## 2021-08-15 DIAGNOSIS — H02831 Dermatochalasis of right upper eyelid: Secondary | ICD-10-CM | POA: Diagnosis not present

## 2021-08-15 DIAGNOSIS — H401131 Primary open-angle glaucoma, bilateral, mild stage: Secondary | ICD-10-CM | POA: Diagnosis not present

## 2021-08-30 ENCOUNTER — Emergency Department (HOSPITAL_COMMUNITY)
Admission: EM | Admit: 2021-08-30 | Discharge: 2021-08-30 | Disposition: A | Discharge status: Home / Self Care | Payer: PPO | Attending: Emergency Medicine | Admitting: Emergency Medicine

## 2021-08-30 ENCOUNTER — Other Ambulatory Visit: Payer: Self-pay

## 2021-08-30 ENCOUNTER — Encounter (HOSPITAL_COMMUNITY): Payer: Self-pay | Admitting: Emergency Medicine

## 2021-08-30 DIAGNOSIS — E11649 Type 2 diabetes mellitus with hypoglycemia without coma: Secondary | ICD-10-CM | POA: Diagnosis not present

## 2021-08-30 DIAGNOSIS — R110 Nausea: Secondary | ICD-10-CM | POA: Insufficient documentation

## 2021-08-30 DIAGNOSIS — R42 Dizziness and giddiness: Secondary | ICD-10-CM | POA: Diagnosis not present

## 2021-08-30 DIAGNOSIS — E162 Hypoglycemia, unspecified: Secondary | ICD-10-CM | POA: Insufficient documentation

## 2021-08-30 DIAGNOSIS — I2510 Atherosclerotic heart disease of native coronary artery without angina pectoris: Secondary | ICD-10-CM | POA: Insufficient documentation

## 2021-08-30 DIAGNOSIS — E039 Hypothyroidism, unspecified: Secondary | ICD-10-CM | POA: Diagnosis not present

## 2021-08-30 DIAGNOSIS — R519 Headache, unspecified: Secondary | ICD-10-CM | POA: Insufficient documentation

## 2021-08-30 DIAGNOSIS — Z79899 Other long term (current) drug therapy: Secondary | ICD-10-CM | POA: Diagnosis not present

## 2021-08-30 DIAGNOSIS — R410 Disorientation, unspecified: Secondary | ICD-10-CM | POA: Insufficient documentation

## 2021-08-30 LAB — CBC WITH DIFFERENTIAL/PLATELET
Abs Immature Granulocytes: 0.03 10*3/uL (ref 0.00–0.07)
Basophils Absolute: 0.1 10*3/uL (ref 0.0–0.1)
Basophils Relative: 1 %
Eosinophils Absolute: 0.2 10*3/uL (ref 0.0–0.5)
Eosinophils Relative: 3 %
HCT: 42.7 % (ref 36.0–46.0)
Hemoglobin: 13.3 g/dL (ref 12.0–15.0)
Immature Granulocytes: 1 %
Lymphocytes Relative: 25 %
Lymphs Abs: 1.7 10*3/uL (ref 0.7–4.0)
MCH: 28.2 pg (ref 26.0–34.0)
MCHC: 31.1 g/dL (ref 30.0–36.0)
MCV: 90.7 fL (ref 80.0–100.0)
Monocytes Absolute: 0.5 10*3/uL (ref 0.1–1.0)
Monocytes Relative: 7 %
Neutro Abs: 4.2 10*3/uL (ref 1.7–7.7)
Neutrophils Relative %: 63 %
Platelets: 226 10*3/uL (ref 150–400)
RBC: 4.71 MIL/uL (ref 3.87–5.11)
RDW: 12.9 % (ref 11.5–15.5)
WBC: 6.6 10*3/uL (ref 4.0–10.5)
nRBC: 0 % (ref 0.0–0.2)

## 2021-08-30 LAB — COMPREHENSIVE METABOLIC PANEL
ALT: 16 U/L (ref 0–44)
AST: 22 U/L (ref 15–41)
Albumin: 3.9 g/dL (ref 3.5–5.0)
Alkaline Phosphatase: 68 U/L (ref 38–126)
Anion gap: 8 (ref 5–15)
BUN: 15 mg/dL (ref 8–23)
CO2: 27 mmol/L (ref 22–32)
Calcium: 9.2 mg/dL (ref 8.9–10.3)
Chloride: 104 mmol/L (ref 98–111)
Creatinine, Ser: 0.87 mg/dL (ref 0.44–1.00)
GFR, Estimated: >60 mL/min (ref >60)
Glucose, Bld: 85 mg/dL (ref 70–99)
Potassium: 3.7 mmol/L (ref 3.5–5.1)
Sodium: 139 mmol/L (ref 135–145)
Total Bilirubin: 0.4 mg/dL (ref 0.3–1.2)
Total Protein: 6.8 g/dL (ref 6.5–8.1)

## 2021-08-30 LAB — CBG MONITORING, ED
Glucose-Capillary: 64 mg/dL — ABNORMAL LOW (ref 70–99)
Glucose-Capillary: 85 mg/dL (ref 70–99)

## 2021-08-30 MED ORDER — ONDANSETRON 8 MG PO TBDP
8.0000 mg | ORAL_TABLET | Freq: Once | ORAL | Status: AC
  Administered 2021-08-30: 8 mg via ORAL
  Filled 2021-08-30: qty 1

## 2021-08-30 MED ORDER — ACETAMINOPHEN 500 MG PO TABS
1000.0000 mg | ORAL_TABLET | Freq: Once | ORAL | Status: AC
  Administered 2021-08-30: 1000 mg via ORAL
  Filled 2021-08-30: qty 2

## 2021-08-30 NOTE — ED Provider Notes (Signed)
[facility] EMERGENCY DEPARTMENT Provider Note   CSN: [id number] Arrival date & time: 08/30/21  7616     History  Chief Complaint  Patient presents with   Hypoglycemia    Kim Grimes is a 71 y.o. female.  Past medical history includes hypothyroidism, IBS, GERD, obesity, coronary artery disease, obesity, coronary artery disease, and carotid artery stenosis.  Patient presents to the ED today with an episode of confusion after working out at the gym.  She states that earlier this morning she felt normal and she had a cup of nonsugar tea.  She did not eat this morning or drink any water.  She then proceeded to go workout at [employer] for about 30 minutes and a moderate intensity exercise.  She states at that point she is started to feel little "foggy brained and lightheaded".  She says she was driving home and she said she could not remember how to get from one place to another and was having difficulty remembering her passwords.  She did up getting home and told her sister about this episode, who urged her to come to the emergency room.  On arrival to the ER, she was found to have a glucose of 64.  She was then given a cup of orange juice. On my interview, patient states that she only has a headache that started when she got here and feels like her normal tension headaches from not eating.  She also expresses nausea because she drank orange juice when she got here and she typically does not do well with high acidic foods.  She states that she is feeling normal mentally at this time but does feel little lightheaded still.  She denies a history of diabetes, is not on any insulin therapy or treatment for diabetes.  She typically eats prior to working out and does not have these problems.  Patient denies any abdominal pain, chest pain, vomiting, shortness of breath, fevers.   Hypoglycemia     Home Medications Prior to Admission medications   Medication Sig Start Date End Date Taking?  Authorizing Provider  albuterol (VENTOLIN HFA) 108 (90 Base) MCG/ACT inhaler TAKE 2 PUFFS BY MOUTH EVERY 6 HOURS AS NEEDED FOR WHEEZE OR SHORTNESS OF BREATH 12/03/20   Noreene Larsson, NP  benzonatate (TESSALON) 100 MG capsule TAKE 1 CAPSULE BY MOUTH 2 TIMES DAILY AS NEEDED FOR COUGH. 07/08/21   Fayrene Helper, MD  benzonatate (TESSALON) 200 MG capsule Take 1 capsule (200 mg total) by mouth 3 (three) times daily as needed for cough. 05/02/21   Tanda Rockers, MD  levothyroxine (SYNTHROID) 88 MCG tablet Take 1 tablet (88 mcg total) by mouth daily before breakfast. 07/14/20   Renato Shin, MD  montelukast (SINGULAIR) 10 MG tablet TAKE 1 TABLET BY MOUTH EVERYDAY AT BEDTIME 06/06/21   Paseda, Dewaine Conger, FNP  naproxen (NAPROSYN) 500 MG tablet Take 1 tablet (500 mg total) by mouth 2 (two) times daily with a meal. 10/09/20   Sanjuana Kava, MD  omeprazole (PRILOSEC) 40 MG capsule Take 30- 60 min before your first and last meals of the day 05/02/21   Tanda Rockers, MD  promethazine-dextromethorphan (PROMETHAZINE-DM) 6.25-15 MG/5ML syrup Take one teaspoon at bedtime , as needed, for cough 03/05/21   Fayrene Helper, MD  Vitamin D, Ergocalciferol, (DRISDOL) 1.25 MG (50000 UNIT) CAPS capsule TAKE 1 CAPSULE BY MOUTH ONE TIME PER WEEK 08/05/21   Fayrene Helper, MD      Allergies  Aspirin, Diazepam, and Prednisone    Review of Systems   Review of Systems  Neurological:  Positive for light-headedness and headaches.  Psychiatric/Behavioral:  Positive for confusion.   All other systems reviewed and are negative.  Physical Exam Updated Vital Signs BP (!) 154/81 (BP Location: Right Arm)    Pulse 76    Temp 98 F (36.7 C) (Oral)    Resp 18    Ht 4\' 10"  (1.473 m)    Wt 63.5 kg    SpO2 100%    BMI 29.26 kg/m  Physical Exam Vitals and nursing note reviewed.  Constitutional:      General: She is not in acute distress.    Appearance: Normal appearance. She is not ill-appearing, toxic-appearing or  diaphoretic.  HENT:     Head: Normocephalic and atraumatic.     Nose: No nasal deformity.     Mouth/Throat:     Lips: Pink. No lesions.     Mouth: Mucous membranes are moist. No injury, lacerations, oral lesions or angioedema.     Pharynx: Oropharynx is clear. Uvula midline. No pharyngeal swelling, oropharyngeal exudate, posterior oropharyngeal erythema or uvula swelling.  Eyes:     General: Gaze aligned appropriately. No scleral icterus.       Right eye: No discharge.        Left eye: No discharge.     Conjunctiva/sclera: Conjunctivae normal.     Right eye: Right conjunctiva is not injected. No exudate or hemorrhage.    Left eye: Left conjunctiva is not injected. No exudate or hemorrhage.    Pupils: Pupils are equal, round, and reactive to light.  Cardiovascular:     Rate and Rhythm: Normal rate and regular rhythm.     Pulses: Normal pulses.          Radial pulses are 2+ on the right side and 2+ on the left side.       Dorsalis pedis pulses are 2+ on the right side and 2+ on the left side.     Heart sounds: Normal heart sounds, S1 normal and S2 normal. Heart sounds not distant. No murmur heard.   No friction rub. No gallop. No S3 or S4 sounds.  Pulmonary:     Effort: Pulmonary effort is normal. No accessory muscle usage or respiratory distress.     Breath sounds: Normal breath sounds. No stridor. No wheezing, rhonchi or rales.  Chest:     Chest wall: No tenderness.  Abdominal:     General: Abdomen is flat. Bowel sounds are normal. There is no distension.     Palpations: Abdomen is soft. There is no mass or pulsatile mass.     Tenderness: There is no abdominal tenderness. There is no guarding or rebound.  Musculoskeletal:     Right lower leg: No edema.     Left lower leg: No edema.  Skin:    General: Skin is warm and dry.     Coloration: Skin is not jaundiced or pale.     Findings: No bruising, erythema, lesion or rash.  Neurological:     General: No focal deficit present.      Mental Status: She is alert and oriented to person, place, and time.     GCS: GCS eye subscore is 4. GCS verbal subscore is 5. GCS motor subscore is 6.     Comments: Alert and Oriented x 3 Speech clear with no aphasia Cranial Nerve testing - PERRLA. EOM intact. No Nystagmus - Facial Sensation  grossly intact - No facial asymmetry - Uvula and Tongue Midline - Accessory Muscles intact Motor: - 5/5 motor strength in all four extremities.  - No pronator Drift - Normal tone Sensation: - Grossly intact in all four extremities.  Coordination:  - Finger to nose and heel to shin intact bilaterally   Psychiatric:        Mood and Affect: Mood normal.        Behavior: Behavior normal. Behavior is cooperative.    ED Results / Procedures / Treatments   Labs (all labs ordered are listed, but only abnormal results are displayed) Labs Reviewed  CBG MONITORING, ED - Abnormal; Notable for the following components:      Result Value   Glucose-Capillary 64 (*)    All other components within normal limits  CBC WITH DIFFERENTIAL/PLATELET  COMPREHENSIVE METABOLIC PANEL  CBG MONITORING, ED    EKG None  Radiology No results found.  Procedures Procedures   Medications Ordered in ED Medications  ondansetron (ZOFRAN-ODT) disintegrating tablet 8 mg (8 mg Oral Given 08/30/21 1142)  acetaminophen (TYLENOL) tablet 1,000 mg (1,000 mg Oral Given 08/30/21 1142)    ED Course/ Medical Decision Making/ A&P                           Medical Decision Making Amount and/or Complexity of Data Reviewed Labs: ordered.  Risk OTC drugs. Prescription drug management.    MDM  This is a 71 y.o. female with a pertinent PMH of hypothyroidism, IBS, GERD, obesity, coronary artery disease, obesity, coronary artery disease, and carotid artery stenosis who presents to the ED with a transient episode of lightheadedness and confusion.  This is in the setting of after workout and not eating or drinking anything all  day. The differential of this patient includes but is not limited to Electrolyte Abnormality, Hypoglycemia, infection, intracranial bleed, CVA, or TIA.  My Impression, Plan, and ED Course: Patient was found to have a glucose of 64 and since been given orange juice.  Patient appears well and is in no acute distress.  She has normal vitals.  Her neurological exam is completely intact.  There is no evidence of any focal findings.  She does have a headache, but she describes this is similar to her tension headaches that she gets when she does not eat.  I have a very low suspicion of intracranial bleed, CVA, or TIA at this time.  Plan to obtain labs to rule out electrolyte abnormality, or other causes to her symptoms. No evidence to suggest infection, seizure, substance use or intoxication. At this point, I think it is likely that symptoms are due to mild hypoglycemia after no p.o. intake today and a moderate intensity exercise.  We will reassess POC glucose shortly.  I personally ordered, reviewed, and interpreted all laboratory work and imaging and agree with radiologist interpretation. Results interpreted below: CBC is without leukocytosis or anemia.  CMP is without electrolyte abnormalities, kidney dysfunction, liver dysfunction.  There is no evidence of acidosis or anion gap.  Glucose is 85 on CMP.  Repeat glucose monitoring is 85.  On reassessment, patient's headache has improved as well as nausea.  She started to feel much better since her sugar has went up.  She still very hungry and thinks that when she eats she will feel completely better.  She is requesting discharge at this time.  I think this is reasonable as I do not think that this  is any intracranial abnormality or other cause.  I think she likely got hypoglycemic since she did not eat or drink anything today and then did a more strenuous exercise than normal.   Charting Requirements Additional history is obtained from:  Independent  historian External Records from outside source obtained and reviewed including: recent Endocrine visit with normal thyroid and no new mediations changes. Social Determinants of Health:  none Pertinant PMH that complicates patient's illness: Carotid Artery Stenosis, Hypothyroidism, GERD, Anxiety, CAD  Patient Care Problems that were addressed during this visit: - Hypoglycemia: Acute illness with systemic symptoms - Headache: Self limited or minor illness Medications given in ED: Tylenol, Zofran Reevaluation of the patient after these medicines showed that the patient improved Critical Care Interventions: Gave juice for hypoglycemia Disposition: Discharge with PCP f/u. Return precautions.  I have discussed this patient with my attending physician, Dr. Alvino Chapel who has made changes to the plan accordingly.  Portions of this note were generated with Lobby[employer]. Dictation errors may occur despite best attempts at proofreading.   Final Clinical Impression(s) / ED Diagnoses Final diagnoses:  Hypoglycemia    Rx / DC Orders ED Discharge Orders     None         Adolphus Birchwood, PA-C 08/30/21 1407    Davonna Belling, MD 08/30/21 1535

## 2021-08-30 NOTE — ED Notes (Signed)
Pt tolerated fluids and PO medicine well.  ?

## 2021-08-30 NOTE — Discharge Instructions (Signed)
You were found to have a low blood sugar. I think this is due to not eating today, dehydration, and a more strenuous workout than usual. If you experience any worsening symptoms, I would return to the ED. If not, then I would follow up with your PCP in one week for reevaluation. ?

## 2021-08-30 NOTE — ED Triage Notes (Signed)
Pt to the ED after an event of confusion after working out at the gym without eating. ? ?Pt was unable to remember passwords, or how to get from one place to another. ? ?CBG in triage was 64. ?Orange juice provided to the patient. ? ?

## 2021-08-30 NOTE — ED Notes (Signed)
Signature pad unavailable for discharge  

## 2021-09-03 ENCOUNTER — Other Ambulatory Visit: Payer: Self-pay

## 2021-09-03 ENCOUNTER — Ambulatory Visit (INDEPENDENT_AMBULATORY_CARE_PROVIDER_SITE_OTHER): Payer: PPO | Admitting: Family Medicine

## 2021-09-03 ENCOUNTER — Encounter: Payer: Self-pay | Admitting: Family Medicine

## 2021-09-03 VITALS — BP 128/84 | HR 72 | Ht <= 58 in | Wt 150.1 lb

## 2021-09-03 DIAGNOSIS — R410 Disorientation, unspecified: Secondary | ICD-10-CM

## 2021-09-03 DIAGNOSIS — Z09 Encounter for follow-up examination after completed treatment for conditions other than malignant neoplasm: Secondary | ICD-10-CM | POA: Diagnosis not present

## 2021-09-03 DIAGNOSIS — R0989 Other specified symptoms and signs involving the circulatory and respiratory systems: Secondary | ICD-10-CM | POA: Diagnosis not present

## 2021-09-03 MED ORDER — HYDROXYZINE PAMOATE 50 MG PO CAPS: ORAL_CAPSULE | ORAL | 0 refills | Status: DC

## 2021-09-03 NOTE — Progress Notes (Signed)
? ?  Kim Grimes     MRN: [id number]      DOB: 03-24-1951 ? ? ?HPI ?Kim Grimes is here for follow up of recent ED visit. ?Patient reports feeling confused and not really sure where she was after leaving the gym at around 8:30 am this past Friday ? ? Kim Grimes called Kim Grimes who is here with Kim today around 1 am advising , Kim that Kim Grimes did not know where she was and needs to find Kim ? Asked Kim Kim Grimes to find Kim Grimes , he did at [facility] where she went to turn in time, and before he could put Kim  in his car, she drove  [place]. [place] ?Similar episode about 2 years ago, no lights on the street at that time and she became confused ? She is accompanied by Kim twin Kim Grimes and a friend who are concerned and want to hear  / need to hear everything said ?Kim Grimes says that patient stays stressed getting into nervones business that does not concern Kim at that point Kim Grimes get teary eyed statin she is concerned about Kim Grimes but does not need counseling, has family and friend support ?Ed visit reviewed and the only abnormality was low blood sugar, states she did not eat before going to the gym ? ?ROS ?Denies recent fever or chills. ?Denies sinus pressure, nasal congestion, ear pain or sore throat. ?Denies chest congestion, productive cough or wheezing. ?Denies chest pains, palpitations and leg swelling ?Denies abdominal pain, nausea, vomiting,diarrhea or constipation.   ?Denies dysuria, frequency, hesitancy or incontinence. ?Denies joint pain, swelling and limitation in mobility. ?Denies headaches, seizures, numbness, or tingling. ?Denies depression, anxiety or insomnia. ?Denies skin break down or rash. ? ? ?PE ? ?BP (!) 145/76   Pulse 82   Ht '4\' 10"'$  (1.473 m)   Wt 150 lb 1.9 oz (68.1 kg)   SpO2 92%   BMI 31.38 kg/m?  ? ?Patient alert and oriented and in no cardiopulmonary distress. ? ?HEENT: No facial asymmetry, EOMI,     Neck supple . ? ?Chest: Clear to auscultation  bilaterally. ? ?CVS: S1, S2 no murmurs, no S3.Regular rate. ? ?ABD: Soft non tender.  ? ?Ext: No edema ? ?MS: Adequate ROM spine, shoulders, hips and knees. ? ?Skin: Intact, no ulcerations or rash noted. ? ?Psych: Good eye contact, normal affect. Memory intact mildly  anxious not  depressed appearing. ? ?CNS: CN 2-12 intact, power,  normal throughout.no focal deficits noted. ? ? ?Assessment & Plan ? ?Confusion ?Acute episode of confusion, reported by concerned family members and patient, brain scan and neurology eval ? ?Carotid artery bruit ?Acute confusion, ned to detrmine blood flow to brain ? ?Encounter for examination following treatment at hospital ?Patient in for follow up of recent ED visit. ?Discharge summary, and laboratory and radiology data are reviewed, and any questions or concerns  are discussed. ?Specific issues requiring follow up are specifically addressed. ? ? ?

## 2021-09-03 NOTE — Patient Instructions (Addendum)
F/U in 4 months, call if you need me sooner ? ?You are referred for brain scan and to Neurologist because of recent episode of confusion ? ?Hyroxyzine is prescribed for use before the scan to prevent/ reduce anxiety , you need someone to drive you on day of test ? ?Very important that you eat BEFORE exercise as your blood sugar was slightly low when you went to the eD ? ?Stress can negatively affect your health and harm you. ?Managing stress and anxiety is something we all learn and re learn as new challenges come up ? ?Speaking with a Professional isd often helpful, espcially whe we find that we feel overwhelmed, cry and worry often and excessively, pls call if you decide to see a therapist ? ?Thanks for choosing [facility], we consider it a privelige to serve you. ? ?

## 2021-09-04 ENCOUNTER — Ambulatory Visit: Payer: PPO | Admitting: Internal Medicine

## 2021-09-05 ENCOUNTER — Telehealth: Payer: Self-pay

## 2021-09-05 NOTE — Telephone Encounter (Signed)
Gave patient the phone number for [facility]. ?

## 2021-09-05 NOTE — Telephone Encounter (Signed)
Patient called states she hasnt heard anything from scheduling her MRI ph# [phone] ?

## 2021-09-05 NOTE — Telephone Encounter (Signed)
You want to call and schedule these? No precert needed. [phone] and let pt know. She has 2 in [place] ?

## 2021-09-07 DIAGNOSIS — Z09 Encounter for follow-up examination after completed treatment for conditions other than malignant neoplasm: Secondary | ICD-10-CM | POA: Insufficient documentation

## 2021-09-07 NOTE — Assessment & Plan Note (Signed)
Acute confusion, ned to detrmine blood flow to brain ?

## 2021-09-07 NOTE — Assessment & Plan Note (Signed)
Patient in for follow up of recent ED visit. Discharge summary, and laboratory and radiology data are reviewed, and any questions or concerns  are discussed. Specific issues requiring follow up are specifically addressed.  

## 2021-09-07 NOTE — Assessment & Plan Note (Signed)
Acute episode of confusion, reported by concerned family members and patient, brain scan and neurology eval ?

## 2021-09-18 ENCOUNTER — Ambulatory Visit: Payer: PPO | Admitting: Internal Medicine

## 2021-09-18 ENCOUNTER — Other Ambulatory Visit: Payer: Self-pay

## 2021-09-18 ENCOUNTER — Telehealth: Payer: Self-pay | Admitting: *Deleted

## 2021-09-18 VITALS — BP 110/70 | HR 82 | Temp 97.0°F | Ht <= 58 in | Wt 149.4 lb

## 2021-09-18 DIAGNOSIS — K5900 Constipation, unspecified: Secondary | ICD-10-CM

## 2021-09-18 DIAGNOSIS — K219 Gastro-esophageal reflux disease without esophagitis: Secondary | ICD-10-CM | POA: Diagnosis not present

## 2021-09-18 DIAGNOSIS — D122 Benign neoplasm of ascending colon: Secondary | ICD-10-CM

## 2021-09-18 NOTE — Telephone Encounter (Signed)
[name] to call back to schedule TCS w/ propofol, ASA 2. Patient wants sutab prep per encounter form ?

## 2021-09-18 NOTE — Patient Instructions (Addendum)
I am happy to happy to hear that you are doing well today. ? ?Continue on omeprazole for your chronic reflux.  You can take this 1-2 times daily as needed. ? ?We will schedule you for colonoscopy for surveillance purposes given your history of polyps.  We will send in prescription for tablets for your prep. ? ?For your constipation, I want you to start taking over the counter MiraLAX 1 capful daily.  If this does not adequately control your constipation, I would increase to 2 capfuls daily.  If this is still not adequate, then I would add on once daily Dulcolax (bisacodyl) tablet. ?  ?I also recommend increasing fiber in your diet or adding OTC Benefiber/Metamucil. Be sure to drink at least 4 to 6 glasses of water daily.  ? ?It was very nice meeting you today. ? ?Dr. Abbey Chatters ? ?At [facility] we value your feedback. You may receive a survey about your visit today. Please share your experience as we strive to create trusting relationships with our patients to provide genuine, compassionate, quality care. ? ?We appreciate your understanding and patience as we review any laboratory studies, imaging, and other diagnostic tests that are ordered as we care for you. Our office policy is 5 business days for review of these results, and any emergent or urgent results are addressed in a timely manner for your best interest. If you do not hear from our office in 1 week, please contact us.  ? ?We also encourage the use of MyChart, which contains your medical information for your review as well. If you are not enrolled in this feature, an access code is on this after visit summary for your convenience. Thank you for allowing [place] to be involved in your care. ? ?It was great to see you today!  I hope you have a great rest of your Winter! ? ? ? ?Kim Grimes. Abbey Chatters, D.O. ?Gastroenterology and Hepatology ?[facility] ? ?

## 2021-09-18 NOTE — Progress Notes (Signed)
? ? ?Referring Provider: Fayrene Helper, MD ?Primary Care Physician:  Fayrene Helper, MD ?Primary GI:  Dr. Abbey Chatters ? ?Chief Complaint  ?Patient presents with  ? Constipation  ? ? ?HPI:   ?Kim Grimes is a 71 y.o. female who presents to clinic today for follow-up visit.  Has chronic GERD which is well controlled on omeprazole.  She takes this 1-2 times daily.  No dysphagia odynophagia.  No epigastric or chest pain. ? ?Due for surveillance colonoscopy.  Last colonoscopy 2018 with 2 small tubular adenomas removed.  Requesting Su tabs for her prep. ? ?Does note some mild intermittent constipation.  Has taken Dulcolax on occasion.  Is also taken enemas.  States currently her bowels are moving well.  No melena hematochezia. ? ?Past Medical History:  ?Diagnosis Date  ? Anxiety disorder   ? Carotid artery stenosis   ? Chronic pelvic pain in female   ? Coronary artery disease   ? Endometriosis   ? GERD (gastroesophageal reflux disease)   ? Helicobacter pylori gastritis   ? Dr. Olevia Perches   ? Hiatal hernia 2003  ? on UGI   ? Hypothyroidism 2009  ? IBS (irritable bowel syndrome) 2003  ? Obesity (BMI 30-39.9) DEC 2011 145 LBS  ? Osteoporosis   ? PONV (postoperative nausea and vomiting)   ? ? ?Past Surgical History:  ?Procedure Laterality Date  ? ABDOMINAL HYSTERECTOMY    ? adhesiolysis    ? dense adhesion between rectum and sigmoid,  & pelvis   ? BRAVO [place] STUDY  01/19/2012  ? Procedure: BRAVO [place];  Surgeon: [name], MD;  [facility];  Service: Endoscopy;;  ? CATARACT EXTRACTION W/PHACO Left 06/03/2019  ? Procedure: CATARACT EXTRACTION PHACO AND INTRAOCULAR LENS PLACEMENT LEFT EYE (CDE: 2.92);  Surgeon: [name], MD;  Location: [facility];  Service: Ophthalmology;  Laterality: Left;  ? COLONOSCOPY  03: [facility]/D & 08: SCREENING  ? WNL'S  ? COLONOSCOPY  07/28/2011  ? sessile polyp in ascending colon/internal hemorrhoids. Next colonoscopy January 2018  ? COLONOSCOPY N/A 08/01/2016  ? Procedure:  COLONOSCOPY;  Surgeon: [name], MD;  [facility];  Service: Endoscopy;  Laterality: N/A;  930 ?  ? ESOPHAGOGASTRODUODENOSCOPY  01/19/2012  ? SLF: SMALL Hiatal hernia/Mild gastritis  ? EYE SURGERY Right 10/18/2013  ? catarat extraction  ? LAPAROSCOPY    ? OLECRANON BURSECTOMY Left 07/01/2018  ? Procedure: LEFT ELBOW OLECRANON BURSECTOMY AND SPUR EXCISION;  Surgeon: [name], MD;  Location: [facility];  Service: Orthopedics;  Laterality: Left;  ? rt foot sx Bilateral   ? s/p hysterectomy    ? SALPINGOOPHORECTOMY    ? UPPER GASTROINTESTINAL ENDOSCOPY  DEC 2010: DYSPEPSIA/DYSPHAGIA  ? [place] RING/ESO DIL 16 MM, GASTRITIS/DUODENITIS 2o to Aleve/on OMP PRN  ? ? ?Current Outpatient Medications  ?Medication Sig Dispense Refill  ? albuterol (VENTOLIN HFA) 108 (90 Base) MCG/ACT inhaler TAKE 2 PUFFS BY MOUTH EVERY 6 HOURS AS NEEDED FOR WHEEZE OR SHORTNESS OF BREATH 8.5 each 5  ? hydrOXYzine (VISTARIL) 50 MG capsule Take one capsule 30 mins before test, may repeat once after 15 minutes if needed , for anxiety 2 capsule 0  ? levothyroxine (SYNTHROID) 88 MCG tablet Take 1 tablet (88 mcg total) by mouth daily before breakfast. 90 tablet 3  ? montelukast (SINGULAIR) 10 MG tablet TAKE 1 TABLET BY MOUTH EVERYDAY AT BEDTIME 90 tablet 1  ? naproxen (NAPROSYN) 500 MG tablet Take 1 tablet (500  mg total) by mouth 2 (two) times daily with a meal. 60 tablet 5  ? omeprazole (PRILOSEC) 40 MG capsule Take 30- 60 min before your first and last meals of the day 60 capsule 2  ? promethazine-dextromethorphan (PROMETHAZINE-DM) 6.25-15 MG/5ML syrup Take one teaspoon at bedtime , as needed, for cough 240 mL 0  ? Vitamin D, Ergocalciferol, (DRISDOL) 1.25 MG (50000 UNIT) CAPS capsule TAKE 1 CAPSULE BY MOUTH ONE TIME PER WEEK 12 capsule 1  ? benzonatate (TESSALON) 100 MG capsule TAKE 1 CAPSULE BY MOUTH 2 TIMES DAILY AS NEEDED FOR COUGH. (Patient not taking: Reported on 09/18/2021) 20 capsule 0  ? benzonatate (TESSALON) 200 MG capsule  Take 1 capsule (200 mg total) by mouth 3 (three) times daily as needed for cough. (Patient not taking: Reported on 09/18/2021) 45 capsule 2  ? ?Current Facility-Administered Medications  ?Medication Dose Route Frequency Provider Last Rate Last Admin  ? azelastine (ASTELIN) 0.1 % nasal spray 1 spray  1 spray Each Nare BID Rozetta Nunnery, MD      ? ? ?Allergies as of 09/18/2021 - Review Complete 09/18/2021  ?Allergen Reaction Noted  ? Aspirin Other (See Comments) 05/09/2014  ? Diazepam Other (See Comments)   ? Prednisone Other (See Comments) 02/21/2020  ? ? ?Family History  ?Problem Relation Age of Onset  ? Prostate cancer Father   ? Colon cancer Father   ?     < 39 YO  ? Cancer Father   ?     prostate and colon  ? Pancreatic cancer Mother   ? Cancer Mother   ?     pancreas  ? Diabetes Mother   ? Diabetes Brother   ? Cancer Brother   ?     prostate   ? Diabetes Sister   ?     prediabetic  ? Heart disease Brother   ?     CHF  ? Thyroid disease Neg Hx   ? ? ?Social History  ? ?Socioeconomic History  ? Marital status: Divorced  ?  Spouse name: Not on file  ? Number of children: Not on file  ? Years of education: Not on file  ? Highest education level: Not on file  ?Occupational History  ? Occupation: fulltime at [employer]   ?Tobacco Use  ? Smoking status: Never  ? Smokeless tobacco: Never  ? Tobacco comments:  ?  Never smoked  ?Vaping Use  ? Vaping Use: Never used  ?Substance and Sexual Activity  ? Alcohol use: Not Currently  ?  Comment: occasional, maybe once/year  ? Drug use: No  ? Sexual activity: Not Currently  ?  Birth control/protection: None  ?Other Topics Concern  ? Not on file  ?Social History Narrative  ? Not on file  ? ?Social Determinants of Health  ? ?Financial Resource Strain: Low Risk   ? Difficulty of Paying Living Expenses: Not hard at all  ?Food Insecurity: No Food Insecurity  ? Worried About Charity fundraiser in the Last Year: Never true  ? Ran Out of Food in the Last Year: Never  true  ?Transportation Needs: No Transportation Needs  ? Lack of Transportation (Medical): No  ? Lack of Transportation (Non-Medical): No  ?Physical Activity: Sufficiently Active  ? Days of Exercise per Week: 5 days  ? Minutes of Exercise per Session: 60 min  ?Stress: No Stress Concern Present  ? Feeling of Stress : Not at all  ?Social Connections: Moderately Integrated  ?  Frequency of Communication with Friends and Family: Three times a week  ? Frequency of Social Gatherings with Friends and Family: Three times a week  ? Attends Religious Services: More than 4 times per year  ? Active Member of Clubs or Organizations: Yes  ? Attends Archivist Meetings: More than 4 times per year  ? Marital Status: Divorced  ? ? ?Subjective: ?Review of Systems  ?Constitutional:  Negative for chills and fever.  ?HENT:  Negative for congestion and hearing loss.   ?Eyes:  Negative for blurred vision and double vision.  ?Respiratory:  Negative for cough and shortness of breath.   ?Cardiovascular:  Negative for chest pain and palpitations.  ?Gastrointestinal:  Positive for heartburn. Negative for abdominal pain, blood in stool, constipation, diarrhea, melena and vomiting.  ?Genitourinary:  Negative for dysuria and urgency.  ?Musculoskeletal:  Negative for joint pain and myalgias.  ?Skin:  Negative for itching and rash.  ?Neurological:  Negative for dizziness and headaches.  ?Psychiatric/Behavioral:  Negative for depression. The patient is not nervous/anxious.   ? ? ?Objective: ?BP 110/70   Pulse 82   Temp (!) 97 ?F (36.1 ?C)   Ht '4\' 10"'$  (1.473 m)   Wt 149 lb 6.4 oz (67.8 kg)   BMI 31.22 kg/m?  ?Physical Exam ?Constitutional:   ?   Appearance: Normal appearance.  ?HENT:  ?   Head: Normocephalic and atraumatic.  ?Eyes:  ?   Extraocular Movements: Extraocular movements intact.  ?   Conjunctiva/sclera: Conjunctivae normal.  ?Cardiovascular:  ?   Rate and Rhythm: Normal rate and regular rhythm.  ?Pulmonary:  ?   Effort:  Pulmonary effort is normal.  ?   Breath sounds: Normal breath sounds.  ?Abdominal:  ?   General: Bowel sounds are normal.  ?   Palpations: Abdomen is soft.  ?Musculoskeletal:     ?   General: No swelling. Normal range

## 2021-09-19 ENCOUNTER — Ambulatory Visit
Admission: RE | Admit: 2021-09-19 | Discharge: 2021-09-19 | Disposition: A | Discharge status: Home / Self Care | Payer: PPO | Source: Ambulatory Visit | Attending: Family Medicine | Admitting: Family Medicine

## 2021-09-19 DIAGNOSIS — H01004 Unspecified blepharitis left upper eyelid: Secondary | ICD-10-CM | POA: Diagnosis not present

## 2021-09-19 DIAGNOSIS — H02831 Dermatochalasis of right upper eyelid: Secondary | ICD-10-CM | POA: Diagnosis not present

## 2021-09-19 DIAGNOSIS — H01005 Unspecified blepharitis left lower eyelid: Secondary | ICD-10-CM | POA: Diagnosis not present

## 2021-09-19 DIAGNOSIS — H401131 Primary open-angle glaucoma, bilateral, mild stage: Secondary | ICD-10-CM | POA: Diagnosis not present

## 2021-09-19 DIAGNOSIS — J341 Cyst and mucocele of nose and nasal sinus: Secondary | ICD-10-CM | POA: Diagnosis not present

## 2021-09-19 DIAGNOSIS — H01002 Unspecified blepharitis right lower eyelid: Secondary | ICD-10-CM | POA: Diagnosis not present

## 2021-09-19 DIAGNOSIS — H04123 Dry eye syndrome of bilateral lacrimal glands: Secondary | ICD-10-CM | POA: Diagnosis not present

## 2021-09-19 DIAGNOSIS — R410 Disorientation, unspecified: Secondary | ICD-10-CM

## 2021-09-19 DIAGNOSIS — Z961 Presence of intraocular lens: Secondary | ICD-10-CM | POA: Diagnosis not present

## 2021-09-19 DIAGNOSIS — R4182 Altered mental status, unspecified: Secondary | ICD-10-CM | POA: Diagnosis not present

## 2021-09-19 DIAGNOSIS — H01001 Unspecified blepharitis right upper eyelid: Secondary | ICD-10-CM | POA: Diagnosis not present

## 2021-09-19 DIAGNOSIS — H02834 Dermatochalasis of left upper eyelid: Secondary | ICD-10-CM | POA: Diagnosis not present

## 2021-09-19 NOTE — Telephone Encounter (Signed)
[name] to call back. Letter mailed. 

## 2021-09-23 ENCOUNTER — Telehealth: Payer: Self-pay

## 2021-09-23 NOTE — Telephone Encounter (Signed)
error 

## 2021-09-23 NOTE — Telephone Encounter (Signed)
Patient called she has seen her mri results in mychart and would like to speak with someone can you see if Dr Posey Pronto will take a look at this ? Ph# [phone]  ?

## 2021-09-30 ENCOUNTER — Telehealth: Payer: Self-pay | Admitting: Family Medicine

## 2021-09-30 NOTE — Telephone Encounter (Signed)
States she is supposed to be having an eye procedure at [facility] on 4/17 due to increased pressure in her eyes and wants to know if the sinusitis seen on the scan had anything to do with it or if it was unrelated. Wanted to know if she still needed to go for procedure for the increased eye pressure. Please advise  ?

## 2021-10-01 ENCOUNTER — Telehealth: Payer: Self-pay

## 2021-10-01 NOTE — Telephone Encounter (Signed)
Faxed report

## 2021-10-01 NOTE — Telephone Encounter (Signed)
Pt aware.

## 2021-10-01 NOTE — Telephone Encounter (Signed)
[place] phone # [phone] need a copy of MRI report ?

## 2021-10-07 ENCOUNTER — Other Ambulatory Visit: Payer: Self-pay | Admitting: Endocrinology

## 2021-10-07 ENCOUNTER — Telehealth: Payer: Self-pay | Admitting: Family Medicine

## 2021-10-07 NOTE — Telephone Encounter (Signed)
Patient requesting call back in regard to referral for Dr. Benjamine Mola  ?

## 2021-10-09 ENCOUNTER — Other Ambulatory Visit: Payer: Self-pay | Admitting: Family Medicine

## 2021-10-09 DIAGNOSIS — H9203 Otalgia, bilateral: Secondary | ICD-10-CM

## 2021-10-09 DIAGNOSIS — J341 Cyst and mucocele of nose and nasal sinus: Secondary | ICD-10-CM

## 2021-10-09 NOTE — Telephone Encounter (Signed)
Patient aware.

## 2021-10-11 ENCOUNTER — Telehealth: Payer: Self-pay | Admitting: Internal Medicine

## 2021-10-11 NOTE — Telephone Encounter (Signed)
Called pt, she will be out of town next month. Advised her we will call her to schedule TCS when June schedule is available. ?

## 2021-10-11 NOTE — Telephone Encounter (Signed)
Patient called and said she was ready to schedule her procedure but does not want it until June ? ?

## 2021-10-14 DIAGNOSIS — H01001 Unspecified blepharitis right upper eyelid: Secondary | ICD-10-CM | POA: Diagnosis not present

## 2021-10-14 DIAGNOSIS — H401121 Primary open-angle glaucoma, left eye, mild stage: Secondary | ICD-10-CM | POA: Diagnosis not present

## 2021-10-14 DIAGNOSIS — H02831 Dermatochalasis of right upper eyelid: Secondary | ICD-10-CM | POA: Diagnosis not present

## 2021-10-14 DIAGNOSIS — H01002 Unspecified blepharitis right lower eyelid: Secondary | ICD-10-CM | POA: Diagnosis not present

## 2021-10-14 DIAGNOSIS — H02834 Dermatochalasis of left upper eyelid: Secondary | ICD-10-CM | POA: Diagnosis not present

## 2021-10-24 ENCOUNTER — Ambulatory Visit (INDEPENDENT_AMBULATORY_CARE_PROVIDER_SITE_OTHER): Payer: PPO | Admitting: Family Medicine

## 2021-10-24 ENCOUNTER — Encounter: Payer: Self-pay | Admitting: Family Medicine

## 2021-10-24 VITALS — BP 128/76 | HR 76 | Ht <= 58 in | Wt 152.0 lb

## 2021-10-24 DIAGNOSIS — B356 Tinea cruris: Secondary | ICD-10-CM

## 2021-10-24 DIAGNOSIS — L259 Unspecified contact dermatitis, unspecified cause: Secondary | ICD-10-CM | POA: Insufficient documentation

## 2021-10-24 DIAGNOSIS — L245 Irritant contact dermatitis due to other chemical products: Secondary | ICD-10-CM | POA: Diagnosis not present

## 2021-10-24 MED ORDER — KETOCONAZOLE 2 % EX CREA: 1.0000 "application " | TOPICAL_CREAM | Freq: Every day | CUTANEOUS | 0 refills | Status: DC

## 2021-10-24 MED ORDER — TRIAMCINOLONE ACETONIDE 0.1 % EX CREA: 1.0000 "application " | TOPICAL_CREAM | Freq: Two times a day (BID) | CUTANEOUS | 0 refills | Status: DC

## 2021-10-24 NOTE — Assessment & Plan Note (Signed)
-  Symptoms and presentation of rash likely due to contact with Tide detergent ?-Topical steroid ordered ?-Patient states that she doesn't take oral steroids due to fear of weight gain from what she has observed in others but can take topical steroids ? ?

## 2021-10-24 NOTE — Progress Notes (Signed)
keto ? ?Established Patient Office Visit ? ?Subjective:  ?Patient ID: Kim Grimes, female    DOB: 27-Aug-1950  Age: 71 y.o. MRN: [id number] ? ?CC:  ?Chief Complaint  ?Patient presents with  ? Rash  ?  Pt complains of rash that started on 10/17/21 rash has been spreading since then. Rash is on arms, chest, back, and bottom.   ? ? ?HPI ?Kim Grimes is a 71 y.o. female with past medical history of Allergic rhinitis. GERD, IBS presents with complaints of multiple pruritic rashes that started on 10/17/21. She reports waking up with the appearance of the rash. She has recently changed her detergent to Tide and started using a new detergent softener. She has applied cortizone 10 and gold bond anti-itch cream with moderate relief. The itching does not intensify during the night; she denies fever, chills, nausea, and vomiting.  ? ?Past Medical History:  ?Diagnosis Date  ? Anxiety disorder   ? Carotid artery stenosis   ? Chronic pelvic pain in female   ? Coronary artery disease   ? Endometriosis   ? GERD (gastroesophageal reflux disease)   ? Helicobacter pylori gastritis   ? Dr. Olevia Perches   ? Hiatal hernia 2003  ? on UGI   ? Hypothyroidism 2009  ? IBS (irritable bowel syndrome) 2003  ? Obesity (BMI 30-39.9) DEC 2011 145 LBS  ? Osteoporosis   ? PONV (postoperative nausea and vomiting)   ? ? ?Past Surgical History:  ?Procedure Laterality Date  ? ABDOMINAL HYSTERECTOMY    ? adhesiolysis    ? dense adhesion between rectum and sigmoid,  & pelvis   ? BRAVO [place] STUDY  01/19/2012  ? Procedure: BRAVO [place];  Surgeon: [name], MD;  [facility];  Service: Endoscopy;;  ? CATARACT EXTRACTION W/PHACO Left 06/03/2019  ? Procedure: CATARACT EXTRACTION PHACO AND INTRAOCULAR LENS PLACEMENT LEFT EYE (CDE: 2.92);  Surgeon: [name], MD;  Location: [facility];  Service: Ophthalmology;  Laterality: Left;  ? COLONOSCOPY  03: [facility]/D & 08: SCREENING  ? WNL'S  ? COLONOSCOPY  07/28/2011  ? sessile polyp in ascending  colon/internal hemorrhoids. Next colonoscopy January 2018  ? COLONOSCOPY N/A 08/01/2016  ? Procedure: COLONOSCOPY;  Surgeon: [name], MD;  [facility];  Service: Endoscopy;  Laterality: N/A;  930 ?  ? ESOPHAGOGASTRODUODENOSCOPY  01/19/2012  ? SLF: SMALL Hiatal hernia/Mild gastritis  ? EYE SURGERY Right 10/18/2013  ? catarat extraction  ? LAPAROSCOPY    ? OLECRANON BURSECTOMY Left 07/01/2018  ? Procedure: LEFT ELBOW OLECRANON BURSECTOMY AND SPUR EXCISION;  Surgeon: [name], MD;  Location: [facility];  Service: Orthopedics;  Laterality: Left;  ? rt foot sx Bilateral   ? s/p hysterectomy    ? SALPINGOOPHORECTOMY    ? UPPER GASTROINTESTINAL ENDOSCOPY  DEC 2010: DYSPEPSIA/DYSPHAGIA  ? [place] RING/ESO DIL 16 MM, GASTRITIS/DUODENITIS 2o to Aleve/on OMP PRN  ? ? ?Family History  ?Problem Relation Age of Onset  ? Prostate cancer Father   ? Colon cancer Father   ?     < 38 YO  ? Cancer Father   ?     prostate and colon  ? Pancreatic cancer Mother   ? Cancer Mother   ?     pancreas  ? Diabetes Mother   ? Diabetes Brother   ? Cancer Brother   ?     prostate   ? Diabetes Sister   ?     prediabetic  ?  Heart disease Brother   ?     CHF  ? Thyroid disease Neg Hx   ? ? ?Social History  ? ?Socioeconomic History  ? Marital status: Divorced  ?  Spouse name: Not on file  ? Number of children: Not on file  ? Years of education: Not on file  ? Highest education level: Not on file  ?Occupational History  ? Occupation: fulltime at [employer]   ?Tobacco Use  ? Smoking status: Never  ? Smokeless tobacco: Never  ? Tobacco comments:  ?  Never smoked  ?Vaping Use  ? Vaping Use: Never used  ?Substance and Sexual Activity  ? Alcohol use: Not Currently  ?  Comment: occasional, maybe once/year  ? Drug use: No  ? Sexual activity: Not Currently  ?  Birth control/protection: None  ?Other Topics Concern  ? Not on file  ?Social History Narrative  ? Not on file  ? ?Social Determinants of Health  ? ?Financial Resource  Strain: Low Risk   ? Difficulty of Paying Living Expenses: Not hard at all  ?Food Insecurity: No Food Insecurity  ? Worried About Charity fundraiser in the Last Year: Never true  ? Ran Out of Food in the Last Year: Never true  ?Transportation Needs: No Transportation Needs  ? Lack of Transportation (Medical): No  ? Lack of Transportation (Non-Medical): No  ?Physical Activity: Sufficiently Active  ? Days of Exercise per Week: 5 days  ? Minutes of Exercise per Session: 60 min  ?Stress: No Stress Concern Present  ? Feeling of Stress : Not at all  ?Social Connections: Moderately Integrated  ? Frequency of Communication with Friends and Family: Three times a week  ? Frequency of Social Gatherings with Friends and Family: Three times a week  ? Attends Religious Services: More than 4 times per year  ? Active Member of Clubs or Organizations: Yes  ? Attends Archivist Meetings: More than 4 times per year  ? Marital Status: Divorced  ?Intimate Partner Violence: Not At Risk  ? Fear of Current or Ex-Partner: No  ? Emotionally Abused: No  ? Physically Abused: No  ? Sexually Abused: No  ? ? ?Outpatient Medications Prior to Visit  ?Medication Sig Dispense Refill  ? albuterol (VENTOLIN HFA) 108 (90 Base) MCG/ACT inhaler TAKE 2 PUFFS BY MOUTH EVERY 6 HOURS AS NEEDED FOR WHEEZE OR SHORTNESS OF BREATH 8.5 each 5  ? benzonatate (TESSALON) 100 MG capsule TAKE 1 CAPSULE BY MOUTH 2 TIMES DAILY AS NEEDED FOR COUGH. 20 capsule 0  ? benzonatate (TESSALON) 200 MG capsule Take 1 capsule (200 mg total) by mouth 3 (three) times daily as needed for cough. 45 capsule 2  ? hydrOXYzine (VISTARIL) 50 MG capsule Take one capsule 30 mins before test, may repeat once after 15 minutes if needed , for anxiety 2 capsule 0  ? levothyroxine (SYNTHROID) 88 MCG tablet TAKE 1 TABLET BY MOUTH DAILY BEFORE BREAKFAST. 90 tablet 1  ? montelukast (SINGULAIR) 10 MG tablet TAKE 1 TABLET BY MOUTH EVERYDAY AT BEDTIME 90 tablet 1  ? naproxen (NAPROSYN) 500 MG  tablet Take 1 tablet (500 mg total) by mouth 2 (two) times daily with a meal. 60 tablet 5  ? omeprazole (PRILOSEC) 40 MG capsule Take 30- 60 min before your first and last meals of the day 60 capsule 2  ? promethazine-dextromethorphan (PROMETHAZINE-DM) 6.25-15 MG/5ML syrup Take one teaspoon at bedtime , as needed, for cough 240 mL 0  ?  Vitamin D, Ergocalciferol, (DRISDOL) 1.25 MG (50000 UNIT) CAPS capsule TAKE 1 CAPSULE BY MOUTH ONE TIME PER WEEK 12 capsule 1  ? ?Facility-Administered Medications Prior to Visit  ?Medication Dose Route Frequency Provider Last Rate Last Admin  ? azelastine (ASTELIN) 0.1 % nasal spray 1 spray  1 spray Each Nare BID Rozetta Nunnery, MD      ? ? ?Allergies  ?Allergen Reactions  ? Aspirin Other (See Comments)  ?  "blood rushing"  ? Diazepam Other (See Comments)  ?  Aggitation  ? Prednisone Other (See Comments)  ?  Any type of steroids. Pt states she has seen what they do to other people and she just doesn't take them  ? ? ?ROS ?Review of Systems  ?Constitutional:  Negative for chills, fatigue and fever.  ?HENT:  Negative for rhinorrhea, sinus pressure, sinus pain, sneezing and sore throat.   ?Eyes:  Negative for photophobia, redness and visual disturbance.  ?Respiratory:  Negative for chest tightness, shortness of breath and wheezing.   ?Cardiovascular:  Negative for chest pain and palpitations.  ?Gastrointestinal:  Negative for constipation, diarrhea, nausea and vomiting.  ?Musculoskeletal:  Negative for joint swelling, myalgias and neck pain.  ?Skin:  Positive for rash.  ?Neurological:  Negative for dizziness, weakness, numbness and headaches.  ?Psychiatric/Behavioral:  Negative for confusion, self-injury and suicidal ideas.   ? ?  ?Objective:  ?  ?Physical Exam ?Constitutional:   ?   Appearance: Normal appearance.  ?HENT:  ?   Right Ear: External ear normal.  ?   Left Ear: External ear normal.  ?   Nose: No congestion or rhinorrhea.  ?   Mouth/Throat:  ?   Mouth: Mucous membranes  are moist.  ?Eyes:  ?   General:     ?   Right eye: No discharge.     ?   Left eye: No discharge.  ?Cardiovascular:  ?   Rate and Rhythm: Normal rate and regular rhythm.  ?   Pulses: Normal pulses.  ?   Heart sound

## 2021-10-24 NOTE — Patient Instructions (Addendum)
I appreciate the opportunity to provide care to you today! ?Pleas pick up your prescriptions at the pharmacy ? ?Kenalog cream for the rash on your skin and ketoconazole cream for the rash between your buttocks. ? ? ?  ?It was a pleasure to see you and I look forward to continuing to work together on your health and well-being. ?Please do not hesitate to call the office if you need care or have questions about your care. ?  ?Have a wonderful day and week. ?With Gratitude, ?Alvira Monday MSN, FNP-BC ? ?

## 2021-10-24 NOTE — Assessment & Plan Note (Signed)
Topical antifungal cream ordered ?

## 2021-10-28 DIAGNOSIS — M810 Age-related osteoporosis without current pathological fracture: Secondary | ICD-10-CM | POA: Diagnosis not present

## 2021-10-28 DIAGNOSIS — R21 Rash and other nonspecific skin eruption: Secondary | ICD-10-CM | POA: Diagnosis not present

## 2021-10-28 DIAGNOSIS — Z9079 Acquired absence of other genital organ(s): Secondary | ICD-10-CM | POA: Diagnosis not present

## 2021-10-28 DIAGNOSIS — Z01419 Encounter for gynecological examination (general) (routine) without abnormal findings: Secondary | ICD-10-CM | POA: Diagnosis not present

## 2021-10-28 DIAGNOSIS — Z90710 Acquired absence of both cervix and uterus: Secondary | ICD-10-CM | POA: Diagnosis not present

## 2021-10-31 ENCOUNTER — Telehealth: Payer: Self-pay

## 2021-10-31 NOTE — Telephone Encounter (Signed)
Called pt to schedule TCS ASA 2 w/Dr. Abbey Chatters. She wants TCS after 12/16/21. Advised her we will call back when the rest of Dr. Ave Filter schedule is available. ?

## 2021-11-01 ENCOUNTER — Ambulatory Visit: Payer: PPO | Admitting: Endocrinology

## 2021-11-04 ENCOUNTER — Encounter: Payer: Self-pay | Admitting: Endocrinology

## 2021-11-04 ENCOUNTER — Ambulatory Visit: Payer: PPO | Admitting: Endocrinology

## 2021-11-04 VITALS — BP 142/96 | HR 85 | Ht <= 58 in | Wt 152.2 lb

## 2021-11-04 DIAGNOSIS — E039 Hypothyroidism, unspecified: Secondary | ICD-10-CM | POA: Diagnosis not present

## 2021-11-04 NOTE — Progress Notes (Signed)
? ?Subjective:  ? ? Patient ID: Kim Grimes, female    DOB: [birth year], 71 y.o.   MRN: [id number] ? ?HPI ?Pt returns for f/u of hypothyroidism (dx'ed 2011; she has been on prescribed thyroid hormone therapy since dx; she has never had thyroid imaging; synthroid dosage has varied from 75-125 mcg/d; TFT have varied, even with minimal changes in dosage).  pt states she feels well in general. She takes synthroid as rx'ed.   ?Past Medical History:  ?Diagnosis Date  ? Anxiety disorder   ? Carotid artery stenosis   ? Chronic pelvic pain in female   ? Coronary artery disease   ? Endometriosis   ? GERD (gastroesophageal reflux disease)   ? Helicobacter pylori gastritis   ? Dr. Olevia Perches   ? Hiatal hernia 2003  ? on UGI   ? Hypothyroidism 2009  ? IBS (irritable bowel syndrome) 2003  ? Obesity (BMI 30-39.9) DEC 2011 145 LBS  ? Osteoporosis   ? PONV (postoperative nausea and vomiting)   ? ? ?Past Surgical History:  ?Procedure Laterality Date  ? ABDOMINAL HYSTERECTOMY    ? adhesiolysis    ? dense adhesion between rectum and sigmoid,  & pelvis   ? BRAVO [place] STUDY  01/19/2012  ? Procedure: BRAVO [place];  Surgeon: [name], MD;  Location: [facility];  Service: Endoscopy;;  ? CATARACT EXTRACTION W/PHACO Left 06/03/2019  ? Procedure: CATARACT EXTRACTION PHACO AND INTRAOCULAR LENS PLACEMENT LEFT EYE (CDE: 2.92);  Surgeon: [name], MD;  Location: [facility];  Service: Ophthalmology;  Laterality: Left;  ? COLONOSCOPY  03: [facility]/D & 08: SCREENING  ? WNL'S  ? COLONOSCOPY  07/28/2011  ? sessile polyp in ascending colon/internal hemorrhoids. Next colonoscopy January 2018  ? COLONOSCOPY N/A 08/01/2016  ? Procedure: COLONOSCOPY;  Surgeon: [name], MD;  Location: [facility];  Service: Endoscopy;  Laterality: N/A;  930 ?  ? ESOPHAGOGASTRODUODENOSCOPY  01/19/2012  ? SLF: SMALL Hiatal hernia/Mild gastritis  ? EYE SURGERY Right 10/18/2013  ? catarat extraction  ? LAPAROSCOPY    ? OLECRANON BURSECTOMY Left 07/01/2018  ? Procedure:  LEFT ELBOW OLECRANON BURSECTOMY AND SPUR EXCISION;  Surgeon: [name], MD;  Location: [facility];  Service: Orthopedics;  Laterality: Left;  ? rt foot sx Bilateral   ? s/p hysterectomy    ? SALPINGOOPHORECTOMY    ? UPPER GASTROINTESTINAL ENDOSCOPY  DEC 2010: DYSPEPSIA/DYSPHAGIA  ? [place] RING/ESO DIL 16 MM, GASTRITIS/DUODENITIS 2o to [place]/on OMP PRN  ? ? ?Social History  ? ?Socioeconomic History  ? Marital status: Divorced  ?  Spouse name: Not on file  ? Number of children: Not on file  ? Years of education: Not on file  ? Highest education level: Not on file  ?Occupational History  ? Occupation: fulltime at [employer]   ?Tobacco Use  ? Smoking status: Never  ? Smokeless tobacco: Never  ? Tobacco comments:  ?  Never smoked  ?Vaping Use  ? Vaping Use: Never used  ?Substance and Sexual Activity  ? Alcohol use: Not Currently  ?  Comment: occasional, maybe once/year  ? Drug use: No  ? Sexual activity: Not Currently  ?  Birth control/protection: None  ?Other Topics Concern  ? Not on file  ?Social History Narrative  ? Not on file  ? ?Social Determinants of Health  ? ?Financial Resource Strain: Low Risk   ? Difficulty of Paying Living Expenses: Not hard at all  ?Food Insecurity: No Food  Insecurity  ? Worried About Charity fundraiser in the Last Year: Never true  ? Ran Out of Food in the Last Year: Never true  ?Transportation Needs: No Transportation Needs  ? Lack of Transportation (Medical): No  ? Lack of Transportation (Non-Medical): No  ?Physical Activity: Sufficiently Active  ? Days of Exercise per Week: 5 days  ? Minutes of Exercise per Session: 60 min  ?Stress: No Stress Concern Present  ? Feeling of Stress : Not at all  ?Social Connections: Moderately Integrated  ? Frequency of Communication with Friends and Family: Three times a week  ? Frequency of Social Gatherings with Friends and Family: Three times a week  ? Attends Religious Services: More than 4 times per year  ? Active Member of Clubs or  Organizations: Yes  ? Attends Archivist Meetings: More than 4 times per year  ? Marital Status: Divorced  ?Intimate Partner Violence: Not At Risk  ? Fear of Current or Ex-Partner: No  ? Emotionally Abused: No  ? Physically Abused: No  ? Sexually Abused: No  ? ? ?Current Outpatient Medications on File Prior to Visit  ?Medication Sig Dispense Refill  ? albuterol (VENTOLIN HFA) 108 (90 Base) MCG/ACT inhaler TAKE 2 PUFFS BY MOUTH EVERY 6 HOURS AS NEEDED FOR WHEEZE OR SHORTNESS OF BREATH 8.5 each 5  ? benzonatate (TESSALON) 100 MG capsule TAKE 1 CAPSULE BY MOUTH 2 TIMES DAILY AS NEEDED FOR COUGH. 20 capsule 0  ? hydrOXYzine (VISTARIL) 50 MG capsule Take one capsule 30 mins before test, may repeat once after 15 minutes if needed , for anxiety 2 capsule 0  ? ketoconazole (NIZORAL) 2 % cream Apply 1 application. topically daily. 15 g 0  ? levothyroxine (SYNTHROID) 88 MCG tablet TAKE 1 TABLET BY MOUTH DAILY BEFORE BREAKFAST. 90 tablet 1  ? montelukast (SINGULAIR) 10 MG tablet TAKE 1 TABLET BY MOUTH EVERYDAY AT BEDTIME 90 tablet 1  ? naproxen (NAPROSYN) 500 MG tablet Take 1 tablet (500 mg total) by mouth 2 (two) times daily with a meal. 60 tablet 5  ? omeprazole (PRILOSEC) 40 MG capsule Take 30- 60 min before your first and last meals of the day 60 capsule 2  ? promethazine-dextromethorphan (PROMETHAZINE-DM) 6.25-15 MG/5ML syrup Take one teaspoon at bedtime , as needed, for cough 240 mL 0  ? triamcinolone cream (KENALOG) 0.1 % Apply 1 application. topically 2 (two) times daily. 30 g 0  ? Vitamin D, Ergocalciferol, (DRISDOL) 1.25 MG (50000 UNIT) CAPS capsule TAKE 1 CAPSULE BY MOUTH ONE TIME PER WEEK 12 capsule 1  ? prednisoLONE acetate (PRED FORTE) 1 % ophthalmic suspension SMARTSIG:1-2 Drop(s) In Eye(s) 2-4 Times Daily    ? ?Current Facility-Administered Medications on File Prior to Visit  ?Medication Dose Route Frequency Provider Last Rate Last Admin  ? azelastine (ASTELIN) 0.1 % nasal spray 1 spray  1 spray Each  Nare BID Rozetta Nunnery, MD      ? ? ?Allergies  ?Allergen Reactions  ? Aspirin Other (See Comments)  ?  "blood rushing"  ? Diazepam Other (See Comments)  ?  Aggitation  ? Prednisone Other (See Comments)  ?  Any type of steroids. Pt states she has seen what they do to other people and she just doesn't take them  ? ? ?Family History  ?Problem Relation Age of Onset  ? Prostate cancer Father   ? Colon cancer Father   ?     < 38 YO  ? Cancer Father   ?  prostate and colon  ? Pancreatic cancer Mother   ? Cancer Mother   ?     pancreas  ? Diabetes Mother   ? Diabetes Brother   ? Cancer Brother   ?     prostate   ? Diabetes Sister   ?     prediabetic  ? Heart disease Brother   ?     CHF  ? Thyroid disease Neg Hx   ? ? ?BP (!) 142/96 (BP Location: Left Arm, Patient Position: Sitting, Cuff Size: Normal)   Pulse 85   Ht '4\' 10"'$  (1.473 m)   Wt 152 lb 3.2 oz (69 kg)   SpO2 94%   BMI 31.81 kg/m?  ? ? ?Review of Systems ? ?   ?Objective:  ? Physical Exam ?VITAL SIGNS:  See vs page ?GENERAL: no distress ?NECK: There is no palpable thyroid enlargement.  No thyroid nodule is palpable.  No palpable lymphadenopathy at the anterior neck.   ? ? ?Lab Results  ?Component Value Date  ? TSH 1.20 11/04/2021  ? ?   ?Assessment & Plan:  ?Hypothyroidism: well-controlled.  Please continue the same synthroid.  ? ?

## 2021-11-04 NOTE — Patient Instructions (Addendum)
Your blood pressure is high today.  Please see your primary care provider soon, to have it rechecked.   ?Blood tests are requested for you today.  We'll let you know about the results.  ?It is best to never miss the medication.  However, if you do miss it, next best is to double up the next time.    ?You should have an endocrinology follow-up appointment in 6 months.   ?

## 2021-11-05 LAB — T4, FREE: Free T4: 1.25 ng/dL (ref 0.60–1.60)

## 2021-11-05 LAB — TSH: TSH: 1.2 u[IU]/mL (ref 0.35–5.50)

## 2021-11-07 DIAGNOSIS — H40003 Preglaucoma, unspecified, bilateral: Secondary | ICD-10-CM | POA: Diagnosis not present

## 2021-11-07 MED ORDER — SUTAB 1479-225-188 MG PO TABS: ORAL_TABLET | ORAL | 0 refills | Status: DC

## 2021-11-07 NOTE — Telephone Encounter (Signed)
Spoke with pt and procedure time moved to [place] will send updated instructions ?

## 2021-11-07 NOTE — Telephone Encounter (Signed)
Called pt. She has been scheduled and she has requested sutabs (ok per encounter form from Dr. Abbey Chatters). Pt aware [facility] and will mail prep instructions. She voiced understanding. Confirmed pharmacy and address.  ?

## 2021-11-07 NOTE — Addendum Note (Signed)
Addended by: Cheron Every on: 11/07/2021 03:44 PM ? ? Modules accepted: Orders ? ?

## 2021-11-21 DIAGNOSIS — H02831 Dermatochalasis of right upper eyelid: Secondary | ICD-10-CM | POA: Diagnosis not present

## 2021-11-21 DIAGNOSIS — H01001 Unspecified blepharitis right upper eyelid: Secondary | ICD-10-CM | POA: Diagnosis not present

## 2021-11-21 DIAGNOSIS — H02834 Dermatochalasis of left upper eyelid: Secondary | ICD-10-CM | POA: Diagnosis not present

## 2021-11-21 DIAGNOSIS — H01002 Unspecified blepharitis right lower eyelid: Secondary | ICD-10-CM | POA: Diagnosis not present

## 2021-11-21 DIAGNOSIS — H401111 Primary open-angle glaucoma, right eye, mild stage: Secondary | ICD-10-CM | POA: Diagnosis not present

## 2021-11-28 DIAGNOSIS — J338 Other polyp of sinus: Secondary | ICD-10-CM | POA: Diagnosis not present

## 2021-11-28 DIAGNOSIS — J310 Chronic rhinitis: Secondary | ICD-10-CM | POA: Diagnosis not present

## 2021-11-28 DIAGNOSIS — J343 Hypertrophy of nasal turbinates: Secondary | ICD-10-CM | POA: Diagnosis not present

## 2021-12-19 DIAGNOSIS — H02831 Dermatochalasis of right upper eyelid: Secondary | ICD-10-CM | POA: Diagnosis not present

## 2021-12-19 DIAGNOSIS — H01002 Unspecified blepharitis right lower eyelid: Secondary | ICD-10-CM | POA: Diagnosis not present

## 2021-12-19 DIAGNOSIS — H02834 Dermatochalasis of left upper eyelid: Secondary | ICD-10-CM | POA: Diagnosis not present

## 2021-12-19 DIAGNOSIS — H401131 Primary open-angle glaucoma, bilateral, mild stage: Secondary | ICD-10-CM | POA: Diagnosis not present

## 2021-12-19 DIAGNOSIS — H01001 Unspecified blepharitis right upper eyelid: Secondary | ICD-10-CM | POA: Diagnosis not present

## 2021-12-24 ENCOUNTER — Ambulatory Visit (HOSPITAL_COMMUNITY): Payer: PPO | Admitting: Anesthesiology

## 2021-12-24 ENCOUNTER — Other Ambulatory Visit: Payer: Self-pay

## 2021-12-24 ENCOUNTER — Ambulatory Visit (HOSPITAL_BASED_OUTPATIENT_CLINIC_OR_DEPARTMENT_OTHER): Payer: PPO | Admitting: Anesthesiology

## 2021-12-24 ENCOUNTER — Encounter (HOSPITAL_COMMUNITY)
Admission: RE | Disposition: A | Discharge status: Home / Self Care | Payer: Self-pay | Source: Home / Self Care | Attending: Internal Medicine

## 2021-12-24 ENCOUNTER — Encounter (HOSPITAL_COMMUNITY): Payer: Self-pay

## 2021-12-24 ENCOUNTER — Ambulatory Visit (HOSPITAL_COMMUNITY)
Admission: RE | Admit: 2021-12-24 | Discharge: 2021-12-24 | Disposition: A | Discharge status: Home / Self Care | Payer: PPO | Attending: Internal Medicine | Admitting: Internal Medicine

## 2021-12-24 DIAGNOSIS — Z09 Encounter for follow-up examination after completed treatment for conditions other than malignant neoplasm: Secondary | ICD-10-CM

## 2021-12-24 DIAGNOSIS — K649 Unspecified hemorrhoids: Secondary | ICD-10-CM

## 2021-12-24 DIAGNOSIS — K449 Diaphragmatic hernia without obstruction or gangrene: Secondary | ICD-10-CM | POA: Insufficient documentation

## 2021-12-24 DIAGNOSIS — Z86010 Personal history of colonic polyps: Secondary | ICD-10-CM

## 2021-12-24 DIAGNOSIS — Z1211 Encounter for screening for malignant neoplasm of colon: Secondary | ICD-10-CM | POA: Diagnosis not present

## 2021-12-24 DIAGNOSIS — F419 Anxiety disorder, unspecified: Secondary | ICD-10-CM | POA: Insufficient documentation

## 2021-12-24 DIAGNOSIS — I2510 Atherosclerotic heart disease of native coronary artery without angina pectoris: Secondary | ICD-10-CM | POA: Insufficient documentation

## 2021-12-24 DIAGNOSIS — K219 Gastro-esophageal reflux disease without esophagitis: Secondary | ICD-10-CM | POA: Insufficient documentation

## 2021-12-24 DIAGNOSIS — E039 Hypothyroidism, unspecified: Secondary | ICD-10-CM | POA: Diagnosis not present

## 2021-12-24 DIAGNOSIS — K635 Polyp of colon: Secondary | ICD-10-CM | POA: Diagnosis not present

## 2021-12-24 DIAGNOSIS — D125 Benign neoplasm of sigmoid colon: Secondary | ICD-10-CM | POA: Insufficient documentation

## 2021-12-24 HISTORY — PX: POLYPECTOMY: SHX149

## 2021-12-24 HISTORY — PX: COLONOSCOPY WITH PROPOFOL: SHX5780

## 2021-12-24 SURGERY — COLONOSCOPY WITH PROPOFOL
Anesthesia: General

## 2021-12-24 MED ORDER — LACTATED RINGERS IV SOLN: INTRAVENOUS | Status: DC

## 2021-12-24 MED ORDER — LACTATED RINGERS IV SOLN: INTRAVENOUS | Status: DC | PRN

## 2021-12-24 MED ORDER — PROPOFOL 10 MG/ML IV BOLUS
INTRAVENOUS | Status: DC | PRN
  Administered 2021-12-24: 50 mg via INTRAVENOUS
  Administered 2021-12-24: 30 mg via INTRAVENOUS
  Administered 2021-12-24: 50 mg via INTRAVENOUS
  Administered 2021-12-24: 20 mg via INTRAVENOUS
  Administered 2021-12-24: 100 mg via INTRAVENOUS
  Administered 2021-12-24: 20 mg via INTRAVENOUS

## 2021-12-24 MED ORDER — LIDOCAINE HCL (CARDIAC) PF 100 MG/5ML IV SOSY
PREFILLED_SYRINGE | INTRAVENOUS | Status: DC | PRN
  Administered 2021-12-24: 50 mg via INTRAVENOUS

## 2021-12-24 NOTE — Anesthesia Preprocedure Evaluation (Signed)
Anesthesia Evaluation  Patient identified by MRN, date of birth, ID band Patient awake    Reviewed: Allergy & Precautions, H&P , NPO status , Patient's Chart, lab work & pertinent test results, reviewed documented beta blocker date and time   History of Anesthesia Complications (+) PONV and history of anesthetic complications  Airway Mallampati: II  TM Distance: >3 FB Neck ROM: full    Dental no notable dental hx.    Pulmonary neg pulmonary ROS,    Pulmonary exam normal breath sounds clear to auscultation       Cardiovascular Exercise Tolerance: Good + CAD   Rhythm:regular Rate:Normal     Neuro/Psych PSYCHIATRIC DISORDERS Anxiety  Neuromuscular disease    GI/Hepatic Neg liver ROS, hiatal hernia, GERD  Medicated,  Endo/Other  Hypothyroidism   Renal/GU negative Renal ROS  negative genitourinary   Musculoskeletal   Abdominal   Peds  Hematology negative hematology ROS (+)   Anesthesia Other Findings   Reproductive/Obstetrics negative OB ROS                             Anesthesia Physical Anesthesia Plan  ASA: 2  Anesthesia Plan: General   Post-op Pain Management:    Induction:   PONV Risk Score and Plan: Propofol infusion  Airway Management Planned:   Additional Equipment:   Intra-op Plan:   Post-operative Plan:   Informed Consent: I have reviewed the patients History and Physical, chart, labs and discussed the procedure including the risks, benefits and alternatives for the proposed anesthesia with the patient or authorized representative who has indicated his/her understanding and acceptance.     Dental Advisory Given  Plan Discussed with: CRNA  Anesthesia Plan Comments:         Anesthesia Quick Evaluation

## 2021-12-25 LAB — SURGICAL PATHOLOGY

## 2021-12-25 NOTE — Anesthesia Postprocedure Evaluation (Signed)
Anesthesia Post Note  Patient: Kim Grimes  Procedure(s) Performed: COLONOSCOPY WITH PROPOFOL POLYPECTOMY INTESTINAL  Patient location during evaluation: Phase II Anesthesia Type: General Level of consciousness: awake Pain management: pain level controlled Vital Signs Assessment: post-procedure vital signs reviewed and stable Respiratory status: spontaneous breathing and respiratory function stable Cardiovascular status: blood pressure returned to baseline and stable Postop Assessment: no headache and no apparent nausea or vomiting Anesthetic complications: no Comments: Late entry   No notable events documented.   Last Vitals:  Vitals:   12/24/21 0737 12/24/21 0827  BP: 128/89 105/74  Pulse: 83 82  Resp: 20 20  Temp: 36.7 C (!) 36.4 C  SpO2: 96% 95%    Last Pain:  Vitals:   12/24/21 0827  TempSrc: Oral  PainSc: 0-No pain                 Louann Sjogren

## 2021-12-30 ENCOUNTER — Encounter (HOSPITAL_COMMUNITY): Payer: Self-pay | Admitting: Internal Medicine

## 2022-01-01 ENCOUNTER — Telehealth: Payer: Self-pay

## 2022-01-01 ENCOUNTER — Telehealth: Payer: Self-pay | Admitting: Internal Medicine

## 2022-01-01 NOTE — Telephone Encounter (Signed)
error 

## 2022-01-01 NOTE — Telephone Encounter (Signed)
Pt had procedure done by Dr Abbey Chatters on 12/24/2021 and said she took the pill prep. She is asking for [place] to fax something to her insurance stating why she took the pill prep so they will pay for it and not bill her. Please advise and call her at [phone]

## 2022-01-01 NOTE — Telephone Encounter (Signed)
Returned the pt's call and was advised by her that she needed [place] to speak to the insurance company to pay for her pill prep. I advised the pt that she requested that and I couldn't do anything about it. Insurance pays for the liquid and our notes here states she asked for pill prep not the Dr. Abbott Pao stated she will dispute it because she can't take the liquid. Transferred call to [place].

## 2022-01-01 NOTE — Telephone Encounter (Signed)
Called pt. Advised her the denial letter scanned in her chart she has to have supporting documentation stating she has tried/failed gavilyte, clenpiq and suprep before she was prescribed the sutabs. Advised therefore since she did not try/fail these medications, we have no supporting documentation for this.

## 2022-01-03 ENCOUNTER — Ambulatory Visit: Payer: PPO | Admitting: Family Medicine

## 2022-02-03 DIAGNOSIS — J45909 Unspecified asthma, uncomplicated: Secondary | ICD-10-CM | POA: Diagnosis not present

## 2022-02-03 DIAGNOSIS — E039 Hypothyroidism, unspecified: Secondary | ICD-10-CM | POA: Diagnosis not present

## 2022-02-03 DIAGNOSIS — R32 Unspecified urinary incontinence: Secondary | ICD-10-CM | POA: Diagnosis not present

## 2022-02-03 DIAGNOSIS — M1990 Unspecified osteoarthritis, unspecified site: Secondary | ICD-10-CM | POA: Diagnosis not present

## 2022-02-03 DIAGNOSIS — K219 Gastro-esophageal reflux disease without esophagitis: Secondary | ICD-10-CM | POA: Diagnosis not present

## 2022-02-03 DIAGNOSIS — G8929 Other chronic pain: Secondary | ICD-10-CM | POA: Diagnosis not present

## 2022-02-03 DIAGNOSIS — J449 Chronic obstructive pulmonary disease, unspecified: Secondary | ICD-10-CM | POA: Diagnosis not present

## 2022-02-03 DIAGNOSIS — E669 Obesity, unspecified: Secondary | ICD-10-CM | POA: Diagnosis not present

## 2022-02-03 DIAGNOSIS — H401121 Primary open-angle glaucoma, left eye, mild stage: Secondary | ICD-10-CM | POA: Diagnosis not present

## 2022-02-03 DIAGNOSIS — Z9849 Cataract extraction status, unspecified eye: Secondary | ICD-10-CM | POA: Diagnosis not present

## 2022-02-17 DIAGNOSIS — Z1231 Encounter for screening mammogram for malignant neoplasm of breast: Secondary | ICD-10-CM | POA: Diagnosis not present

## 2022-02-27 ENCOUNTER — Other Ambulatory Visit: Payer: Self-pay | Admitting: Family Medicine

## 2022-03-21 DIAGNOSIS — H02834 Dermatochalasis of left upper eyelid: Secondary | ICD-10-CM | POA: Diagnosis not present

## 2022-03-21 DIAGNOSIS — H02831 Dermatochalasis of right upper eyelid: Secondary | ICD-10-CM | POA: Diagnosis not present

## 2022-03-25 ENCOUNTER — Ambulatory Visit (INDEPENDENT_AMBULATORY_CARE_PROVIDER_SITE_OTHER): Payer: PPO | Admitting: Nurse Practitioner

## 2022-03-25 ENCOUNTER — Encounter: Payer: Self-pay | Admitting: Nurse Practitioner

## 2022-03-25 DIAGNOSIS — Z0000 Encounter for general adult medical examination without abnormal findings: Secondary | ICD-10-CM

## 2022-03-25 NOTE — Patient Instructions (Addendum)
  [name] , Thank you for taking time to come for your Medicare Wellness Visit. I appreciate your ongoing commitment to your health goals. Please review the following plan we discussed and let me know if I can assist you in the future.   These are the goals we discussed:  Goals   None     This is a list of the screening recommended for you and due dates:  Health Maintenance  Topic Date Due   COVID-19 Vaccine (6 - Moderna series) 08/08/2021   Tetanus Vaccine  09/17/2021   Flu Shot  01/28/2022   Mammogram  02/16/2023   Colon Cancer Screening  12/25/2031   Pneumonia Vaccine  Completed   DEXA scan (bone density measurement)  Completed   Hepatitis C Screening: USPSTF Recommendation to screen - Ages 95-79 yo.  Completed   Zoster (Shingles) Vaccine  Completed   HPV Vaccine  Aged Out

## 2022-03-25 NOTE — Progress Notes (Signed)
I connected with  Kim Grimes on 03/25/22 by a audio enabled telemedicine application and verified that I am speaking with the correct person using two identifiers.  Patient Location: Home  Provider Location: Office/Clinic  I discussed the limitations of evaluation and management by telemedicine. The patient expressed understanding and agreed to proceed.   Subjective:   Kim Grimes is a 71 y.o. female who presents for Medicare Annual (Subsequent) preventive examination.  Review of Systems           Objective:    There were no vitals filed for this visit. There is no height or weight on file to calculate BMI.     12/24/2021    7:29 AM 08/30/2021   11:07 AM 02/21/2021    4:07 PM 05/31/2019    1:10 PM 07/01/2018    8:31 AM 06/22/2018   11:00 AM 02/15/2018    4:17 PM  Advanced Directives  Does Patient Have a Medical Advance Directive? Yes No Yes No No No No  Type of Advance Directive Healthcare Power of [place]      Does patient want to make changes to medical advance directive?       Yes (ED - Information included in AVS)  Would patient like information on creating a medical advance directive?  No - Patient declined  No - Patient declined No - Patient declined No - Patient declined     Current Medications (verified) Outpatient Encounter Medications as of 03/25/2022  Medication Sig   albuterol (VENTOLIN HFA) 108 (90 Base) MCG/ACT inhaler TAKE 2 PUFFS BY MOUTH EVERY 6 HOURS AS NEEDED FOR WHEEZE OR SHORTNESS OF BREATH   benzonatate (TESSALON) 100 MG capsule TAKE 1 CAPSULE BY MOUTH 2 TIMES DAILY AS NEEDED FOR COUGH.   ipratropium (ATROVENT) 0.06 % nasal spray Place 2 sprays into both nostrils 2 (two) times daily as needed for allergies.   ketoconazole (NIZORAL) 2 % cream Apply 1 application. topically daily. (Patient not taking: Reported on 12/18/2021)   levothyroxine (SYNTHROID) 88 MCG tablet TAKE 1 TABLET BY MOUTH DAILY BEFORE  BREAKFAST.   montelukast (SINGULAIR) 10 MG tablet TAKE 1 TABLET BY MOUTH EVERYDAY AT BEDTIME (Patient taking differently: Take 10 mg by mouth daily as needed (allergies).)   omeprazole (PRILOSEC) 40 MG capsule TAKE 30- 60 MIN BEFORE YOUR FIRST AND LAST MEALS OF THE DAY   Polyethyl Glycol-Propyl Glycol (SYSTANE OP) Place 1 drop into both eyes daily as needed (dry eyes).   promethazine-dextromethorphan (PROMETHAZINE-DM) 6.25-15 MG/5ML syrup Take one teaspoon at bedtime , as needed, for cough   triamcinolone cream (KENALOG) 0.1 % Apply 1 application. topically 2 (two) times daily. (Patient not taking: Reported on 12/18/2021)   Vitamin D, Ergocalciferol, (DRISDOL) 1.25 MG (50000 UNIT) CAPS capsule TAKE 1 CAPSULE BY MOUTH ONE TIME PER WEEK   Facility-Administered Encounter Medications as of 03/25/2022  Medication   azelastine (ASTELIN) 0.1 % nasal spray 1 spray    Allergies (verified) Aspirin, Diazepam, and Prednisone   History: Past Medical History:  Diagnosis Date   Anxiety disorder    Carotid artery stenosis    Chronic pelvic pain in female    Coronary artery disease    Endometriosis    GERD (gastroesophageal reflux disease)    Helicobacter pylori gastritis    Dr. Olevia Perches    Hiatal hernia 2003   on UGI    Hypothyroidism 2009   IBS (irritable bowel syndrome) 2003   Obesity (BMI 30-39.9) DEC  2011 145 LBS   Osteoporosis    PONV (postoperative nausea and vomiting)    Past Surgical History:  Procedure Laterality Date   ABDOMINAL HYSTERECTOMY     adhesiolysis     dense adhesion between rectum and sigmoid,  & pelvis    BRAVO PH STUDY  01/19/2012   Procedure: BRAVO Arnold Line;  Surgeon: [name], MD;  [facility];  Service: Endoscopy;;   CATARACT EXTRACTION W/PHACO Left 06/03/2019   Procedure: CATARACT EXTRACTION PHACO AND INTRAOCULAR LENS PLACEMENT LEFT EYE (CDE: 2.92);  Surgeon: [name], MD;  Location: [facility];  Service: Ophthalmology;  Laterality: Left;    COLONOSCOPY  03: [facility]/D & 08: SCREENING   WNL'S   COLONOSCOPY  07/28/2011   sessile polyp in ascending colon/internal hemorrhoids. Next colonoscopy January 2018   COLONOSCOPY N/A 08/01/2016   Procedure: COLONOSCOPY;  Surgeon: [name], MD;  [facility]: [facility];  Service: Endoscopy;  Laterality: N/A;  930    COLONOSCOPY WITH PROPOFOL N/A 12/24/2021   Procedure: COLONOSCOPY WITH PROPOFOL;  Surgeon: Eloise Harman, DO;  [facility]: [facility];  Service: Endoscopy;  Laterality: N/A;  9:00am   ESOPHAGOGASTRODUODENOSCOPY  01/19/2012   SLF: SMALL Hiatal hernia/Mild gastritis   EYE SURGERY Right 10/18/2013   catarat extraction   LAPAROSCOPY     OLECRANON BURSECTOMY Left 07/01/2018   Procedure: LEFT ELBOW OLECRANON BURSECTOMY AND SPUR EXCISION;  Surgeon: [name], MD;  Location: [facility];  Service: Orthopedics;  Laterality: Left;   POLYPECTOMY  12/24/2021   Procedure: POLYPECTOMY INTESTINAL;  Surgeon: Eloise Harman, DO;  [facility];  Service: Endoscopy;;   rt foot sx Bilateral    s/p hysterectomy     SALPINGOOPHORECTOMY     UPPER GASTROINTESTINAL ENDOSCOPY  DEC 2010: DYSPEPSIA/DYSPHAGIA   [place] RING/ESO DIL 16 MM, GASTRITIS/DUODENITIS 2o to Aleve/on OMP PRN   Family History  Problem Relation Age of Onset   Prostate cancer Father    Colon cancer Father        < 34 YO   Cancer Father        prostate and colon   Pancreatic cancer Mother    Cancer Mother        pancreas   Diabetes Mother    Diabetes Brother    Cancer Brother        prostate    Diabetes Sister        prediabetic   Heart disease Brother        CHF   Thyroid disease Neg Hx    Social History   Socioeconomic History   Marital status: Divorced    Spouse name: Not on file   Number of children: Not on file   Years of education: Not on file   Highest education level: Not on file  Occupational History   Occupation: fulltime at [employer]   Tobacco Use   Smoking status: Never    Smokeless tobacco: Never   Tobacco comments:    Never smoked  Vaping Use   Vaping Use: Never used  Substance and Sexual Activity   Alcohol use: Not Currently    Comment: occasional, maybe once/year   Drug use: No   Sexual activity: Not Currently    Birth control/protection: None  Other Topics Concern   Not on file  Social History Narrative   Not on file   Social Determinants of Health   Financial Resource Strain: Low Risk  (02/21/2021)  Overall Financial Resource Strain (CARDIA)    Difficulty of Paying Living Expenses: Not hard at all  Food Insecurity: No Food Insecurity (02/21/2021)   Hunger Vital Sign    Worried About Running Out of Food in the Last Year: Never true    Ran Out of Food in the Last Year: Never true  Transportation Needs: No Transportation Needs (02/21/2021)   PRAPARE - Hydrologist (Medical): No    Lack of Transportation (Non-Medical): No  Physical Activity: Sufficiently Active (02/21/2021)   Exercise Vital Sign    Days of Exercise per Week: 5 days    Minutes of Exercise per Session: 60 min  Stress: No Stress Concern Present (02/21/2021)   [place]    Feeling of Stress : Not at all  Social Connections: Moderately Integrated (02/21/2021)   Social Connection and Isolation Panel [NHANES]    Frequency of Communication with Friends and Family: Three times a week    Frequency of Social Gatherings with Friends and Family: Three times a week    Attends Religious Services: More than 4 times per year    Active Member of Clubs or Organizations: Yes    Attends Music therapist: More than 4 times per year    Marital Status: Divorced    Tobacco Counseling Counseling given: Not Answered Tobacco comments: Never smoked   Clinical Intake:                 Diabetic?no          Activities of Daily Living     No data to display           Patient Care Team: Fayrene Helper, MD as PCP - General (Family Medicine) [name], MD (Inactive) (Gastroenterology)  Indicate any recent Medical Services you may have received from other than [facility] providers in the past year (date may be approximate).     Assessment:   This is a routine wellness examination for [place].  Hearing/Vision screen No results found.  Dietary issues and exercise activities discussed:     Goals Addressed   None    Depression Screen    10/24/2021    8:09 AM 09/03/2021    9:22 AM 03/05/2021    3:51 PM 02/21/2021    4:05 PM 11/08/2020    2:57 PM 07/25/2020    3:33 PM 02/21/2020    8:31 AM  PHQ 2/9 Scores  PHQ - 2 Score 0 0 0 0 0 0 3  PHQ- 9 Score       4    Fall Risk    10/24/2021    8:09 AM 09/03/2021    9:22 AM 03/05/2021    3:51 PM 02/21/2021    4:08 PM 11/08/2020    2:57 PM  [place] in the past year? 0 0 0 0 0  Number falls in past yr: 0 0 0 0 0  Injury with Fall? 0 0 0 0 0  Risk for fall due to : No Fall Risks No Fall Risks  No Fall Risks No Fall Risks  Follow up Falls evaluation completed Falls evaluation completed  Falls evaluation completed Falls evaluation completed    [place]:  Any stairs in or around the home? No  If so, are there any without handrails? N/A Home free of loose throw rugs in walkways, pet beds, electrical cords, etc?  Yes  Adequate lighting in your home to reduce risk of falls? Yes   ASSISTIVE DEVICES UTILIZED TO PREVENT FALLS:  Life alert? No  Use of a cane, walker or w/c? No  Grab bars in the bathroom? No  Shower chair or bench in shower? No  Elevated toilet seat or a handicapped toilet? No   TIMED UP AND GO:   Cognitive Function:        02/21/2021    4:10 PM 02/21/2020    8:33 AM 02/17/2019    8:14 AM 02/15/2018    4:20 PM  6CIT Screen  What Year? 0 points  0 points 0 points  What month? 0 points  0 points 0 points  What time? 0 points 0 points 0  points 0 points  Count back from 20 0 points 0 points 0 points 0 points  Months in reverse 0 points 0 points 0 points 2 points  Repeat phrase 4 points 0 points 0 points 2 points  Total Score 4 points  0 points 4 points    Immunizations Immunization History  Administered Date(s) Administered   Fluad Quad(high Dose 65+) 02/28/2019, 03/13/2020, 02/25/2021   Influenza, High Dose Seasonal PF 05/08/2017   Influenza,inj,Quad PF,6+ Mos 04/13/2013, 05/09/2014, 03/23/2015, 02/16/2018   Moderna Covid-19 Vaccine Bivalent Booster 7yr & up 04/07/2021   Moderna Sars-Covid-2 Vaccination 08/13/2019, 09/10/2019, 04/21/2020, 02/04/2021   Pneumococcal Conjugate-13 03/15/2018   Pneumococcal Polysaccharide-23 03/22/2019   Tdap 09/18/2011   Zoster Recombinat (Shingrix) 07/25/2020, 04/07/2021   Zoster, Live 09/18/2011    TDAP status: Due, Education has been provided regarding the importance of this vaccine. Advised may receive this vaccine at local pharmacy or Health Dept. Aware to provide a copy of the vaccination record if obtained from local pharmacy or Health Dept. Verbalized acceptance and understanding.  Flu Vaccine status: Due, Education has been provided regarding the importance of this vaccine. Advised may receive this vaccine at local pharmacy or Health Dept. Aware to provide a copy of the vaccination record if obtained from local pharmacy or Health Dept. Verbalized acceptance and understanding.  Pneumococcal vaccine status: Up to date  Covid-19 vaccine status: Information provided on how to obtain vaccines.   Qualifies for Shingles Vaccine? Yes   Zostavax completed  Shingrix Completed?: Yes  Screening Tests Health Maintenance  Topic Date Due   COVID-19 Vaccine (6 - Moderna series) 08/08/2021   TETANUS/TDAP  09/17/2021   INFLUENZA VACCINE  01/28/2022   MAMMOGRAM  02/16/2023   COLONOSCOPY (Pts 45-482yrInsurance coverage will need to be confirmed)  12/25/2031   Pneumonia Vaccine 70 Years old  Completed   DEXA SCAN  Completed   Hepatitis C Screening  Completed   Zoster Vaccines- Shingrix  Completed   HPV VACCINES  Aged Out    Health Maintenance  Health Maintenance Due  Topic Date Due   COVID-19 Vaccine (6 - Moderna series) 08/08/2021   TETANUS/TDAP  09/17/2021   INFLUENZA VACCINE  01/28/2022    Up to date mammogram and colonoscopy . [place] [facility] [place] [place]     Lung Cancer Screening: (Low Dose CT Chest recommended if Age 70-80ears, 30 pack-year currently smoking OR have quit w/in 15years.) does not qualify.   Lung Cancer Screening Referral:   Additional Screening:  Hepatitis C Screening: does not qualify; Completed   Vision Screening: Recommended annual ophthalmology exams for early detection of glaucoma and other disorders of the eye. Is the  patient up to date with their annual eye exam?  Yes  Who is the provider or what is the name of the office in which the patient attends annual eye exams? [facility]  If pt is not established with a provider, would they like to be referred to a provider to establish care? No .   Dental Screening: Recommended annual dental exams for proper oral hygiene  Community Resource Referral / Chronic Care Management:     Plan:     I have personally reviewed and noted the following in the patient's chart:   Medical and social history Use of alcohol, tobacco or illicit drugs  Current medications and supplements including opioid prescriptions. Patient is not currently taking opioid prescriptions. Functional ability and status Nutritional status Physical activity Advanced directives List of other physicians Hospitalizations, surgeries, and ER visits in previous 12 months Vitals Screenings to include cognitive, depression, and falls Referrals and appointments  In addition, I have reviewed and discussed with patient certain preventive protocols, quality  metrics, and best practice recommendations. A written personalized care plan for preventive services as well as general preventive health recommendations were provided to patient.     Renee Rival, FNP   03/25/2022   Nurse Notes:

## 2022-03-28 NOTE — Addendum Note (Signed)
Addended by: Eual Fines on: 03/28/2022 01:54 PM   Modules accepted: Orders, Level of Service

## 2022-04-14 ENCOUNTER — Encounter: Payer: PPO | Admitting: Internal Medicine

## 2022-04-14 ENCOUNTER — Telehealth: Payer: Self-pay

## 2022-04-14 NOTE — Telephone Encounter (Signed)
Burman Blacksmith called from [facility] [phone] asked if provider could order blood work for reoccurrence subconjunctival hemorrhages. Asked to reach out to the patient to let patient know when to do these labs.

## 2022-04-15 ENCOUNTER — Other Ambulatory Visit: Payer: Self-pay

## 2022-04-15 DIAGNOSIS — E559 Vitamin D deficiency, unspecified: Secondary | ICD-10-CM

## 2022-04-15 DIAGNOSIS — R7989 Other specified abnormal findings of blood chemistry: Secondary | ICD-10-CM

## 2022-04-15 NOTE — Telephone Encounter (Signed)
Ordered patient aware

## 2022-04-16 DIAGNOSIS — E559 Vitamin D deficiency, unspecified: Secondary | ICD-10-CM | POA: Diagnosis not present

## 2022-04-16 DIAGNOSIS — R7989 Other specified abnormal findings of blood chemistry: Secondary | ICD-10-CM | POA: Diagnosis not present

## 2022-04-17 LAB — CBC
Hematocrit: 40.4 % (ref 34.0–46.6)
Hemoglobin: 13.5 g/dL (ref 11.1–15.9)
MCH: 28.5 pg (ref 26.6–33.0)
MCHC: 33.4 g/dL (ref 31.5–35.7)
MCV: 85 fL (ref 79–97)
Platelets: 270 10*3/uL (ref 150–450)
RBC: 4.74 x10E6/uL (ref 3.77–5.28)
RDW: 13 % (ref 11.7–15.4)
WBC: 4.6 10*3/uL (ref 3.4–10.8)

## 2022-04-17 LAB — LIPID PANEL
Chol/HDL Ratio: 3.1 ratio (ref 0.0–4.4)
Cholesterol, Total: 197 mg/dL (ref 100–199)
HDL: 63 mg/dL (ref >39)
LDL Chol Calc (NIH): 118 mg/dL — ABNORMAL HIGH (ref 0–99)
Triglycerides: 91 mg/dL (ref 0–149)
VLDL Cholesterol Cal: 16 mg/dL (ref 5–40)

## 2022-04-17 LAB — CMP14+EGFR
ALT: 17 IU/L (ref 0–32)
AST: 21 IU/L (ref 0–40)
Albumin/Globulin Ratio: 1.9 (ref 1.2–2.2)
Albumin: 4.2 g/dL (ref 3.9–4.9)
Alkaline Phosphatase: 79 IU/L (ref 44–121)
BUN/Creatinine Ratio: 13 (ref 12–28)
BUN: 11 mg/dL (ref 8–27)
Bilirubin Total: 0.4 mg/dL (ref 0.0–1.2)
CO2: 24 mmol/L (ref 20–29)
Calcium: 9.1 mg/dL (ref 8.7–10.3)
Chloride: 105 mmol/L (ref 96–106)
Creatinine, Ser: 0.84 mg/dL (ref 0.57–1.00)
Globulin, Total: 2.2 g/dL (ref 1.5–4.5)
Glucose: 87 mg/dL (ref 70–99)
Potassium: 4.4 mmol/L (ref 3.5–5.2)
Sodium: 142 mmol/L (ref 134–144)
Total Protein: 6.4 g/dL (ref 6.0–8.5)
eGFR: 75 mL/min/{1.73_m2} (ref >59)

## 2022-04-17 LAB — VITAMIN D 25 HYDROXY (VIT D DEFICIENCY, FRACTURES): Vit D, 25-Hydroxy: 26.9 ng/mL — ABNORMAL LOW (ref 30.0–100.0)

## 2022-04-30 ENCOUNTER — Telehealth: Payer: Self-pay | Admitting: Family Medicine

## 2022-04-30 ENCOUNTER — Ambulatory Visit (INDEPENDENT_AMBULATORY_CARE_PROVIDER_SITE_OTHER): Payer: PPO

## 2022-04-30 DIAGNOSIS — Z23 Encounter for immunization: Secondary | ICD-10-CM

## 2022-04-30 NOTE — Telephone Encounter (Signed)
Patient aware of lab results.

## 2022-04-30 NOTE — Telephone Encounter (Signed)
Pt wants a call back for lab results.

## 2022-05-01 ENCOUNTER — Encounter: Payer: Self-pay | Admitting: Internal Medicine

## 2022-05-01 ENCOUNTER — Ambulatory Visit (INDEPENDENT_AMBULATORY_CARE_PROVIDER_SITE_OTHER): Payer: PPO | Admitting: Internal Medicine

## 2022-05-01 ENCOUNTER — Encounter: Payer: Self-pay | Admitting: *Deleted

## 2022-05-01 VITALS — BP 121/81 | HR 86 | Temp 97.7°F | Ht <= 58 in | Wt 157.2 lb

## 2022-05-01 DIAGNOSIS — K449 Diaphragmatic hernia without obstruction or gangrene: Secondary | ICD-10-CM | POA: Diagnosis not present

## 2022-05-01 DIAGNOSIS — K5904 Chronic idiopathic constipation: Secondary | ICD-10-CM

## 2022-05-01 DIAGNOSIS — K219 Gastro-esophageal reflux disease without esophagitis: Secondary | ICD-10-CM | POA: Diagnosis not present

## 2022-05-01 MED ORDER — DEXLANSOPRAZOLE 60 MG PO CPDR: 60.0000 mg | DELAYED_RELEASE_CAPSULE | Freq: Every day | ORAL | 3 refills | Status: DC

## 2022-05-01 NOTE — H&P (View-Only) (Signed)
Referring Provider: Fayrene Helper, MD Primary Care Physician:  Fayrene Helper, MD Primary GI:  Dr. Abbey Chatters  Chief Complaint  Patient presents with   Follow-up    Follow up on her GERD, burning sensation in her throat    HPI:   Kim Grimes is a 71 y.o. female who presents to clinic today for follow-up visit.  Last seen March 2023.  History of chronic GERD which has historically been well controlled on omeprazole 1-2 times daily though recently this has worsened.  Notes feeling "bubbles" in her esophagus/epigastric region.  Notes breakthrough symptoms of burning.  Also notes associated burning of her right ear.  Previously on pantoprazole which did not help.  Was on Dexilant 10 years ago by way of samples, she is unsure if this helped or not.  No dysphagia odynophagia.  Underwent surveillance colonoscopy due to history of polyps in the past on 12/24/2021, 1 tubular adenoma removed, recall 2028.  Last EGD 01/19/2012, small hiatal hernia, gastritis, biopsies negative for H. pylori, fundic gland polyp.  History of constipation as well, states this is well controlled at present.  Past Medical History:  Diagnosis Date   Anxiety disorder    Carotid artery stenosis    Chronic pelvic pain in female    Coronary artery disease    Endometriosis    GERD (gastroesophageal reflux disease)    Helicobacter pylori gastritis    Dr. Olevia Perches    Hiatal hernia 2003   on UGI    Hypothyroidism 2009   IBS (irritable bowel syndrome) 2003   Obesity (BMI 30-39.9) DEC 2011 145 LBS   Osteoporosis    PONV (postoperative nausea and vomiting)     Past Surgical History:  Procedure Laterality Date   ABDOMINAL HYSTERECTOMY     adhesiolysis     dense adhesion between rectum and sigmoid,  & pelvis    BRAVO PH STUDY  01/19/2012   Procedure: BRAVO [place];  Surgeon: [name], MD;  [facility];  Service: Endoscopy;;   CATARACT EXTRACTION W/PHACO Left 06/03/2019   Procedure:  CATARACT EXTRACTION PHACO AND INTRAOCULAR LENS PLACEMENT LEFT EYE (CDE: 2.92);  Surgeon: [name], MD;  Location: [facility];  Service: Ophthalmology;  Laterality: Left;   COLONOSCOPY  03: [facility]/D & 08: SCREENING   WNL'S   COLONOSCOPY  07/28/2011   sessile polyp in ascending colon/internal hemorrhoids. Next colonoscopy January 2018   COLONOSCOPY N/A 08/01/2016   Procedure: COLONOSCOPY;  Surgeon: [name], MD;  [facility]: [facility];  Service: Endoscopy;  Laterality: N/A;  930    COLONOSCOPY WITH PROPOFOL N/A 12/24/2021   Procedure: COLONOSCOPY WITH PROPOFOL;  Surgeon: Eloise Harman, DO;  [facility];  Service: Endoscopy;  Laterality: N/A;  9:00am   ESOPHAGOGASTRODUODENOSCOPY  01/19/2012   SLF: SMALL Hiatal hernia/Mild gastritis   EYE SURGERY Right 10/18/2013   catarat extraction   LAPAROSCOPY     OLECRANON BURSECTOMY Left 07/01/2018   Procedure: LEFT ELBOW OLECRANON BURSECTOMY AND SPUR EXCISION;  Surgeon: [name], MD;  Location: [facility];  Service: Orthopedics;  Laterality: Left;   POLYPECTOMY  12/24/2021   Procedure: POLYPECTOMY INTESTINAL;  Surgeon: Eloise Harman, DO;  [facility];  Service: Endoscopy;;   rt foot sx Bilateral    s/p hysterectomy     SALPINGOOPHORECTOMY     UPPER GASTROINTESTINAL ENDOSCOPY  DEC 2010: DYSPEPSIA/DYSPHAGIA   [place] RING/ESO DIL 16 MM, GASTRITIS/DUODENITIS 2o to Aleve/on OMP  PRN    Current Outpatient Medications  Medication Sig Dispense Refill   ipratropium (ATROVENT) 0.06 % nasal spray Place 2 sprays into both nostrils 2 (two) times daily as needed for allergies.     levothyroxine (SYNTHROID) 88 MCG tablet TAKE 1 TABLET BY MOUTH DAILY BEFORE BREAKFAST. 90 tablet 1   omeprazole (PRILOSEC) 40 MG capsule TAKE 30- 60 MIN BEFORE YOUR FIRST AND LAST MEALS OF THE DAY 180 capsule 1   albuterol (VENTOLIN HFA) 108 (90 Base) MCG/ACT inhaler TAKE 2 PUFFS BY MOUTH EVERY 6 HOURS AS NEEDED FOR WHEEZE OR SHORTNESS OF BREATH (Patient  not taking: Reported on 05/01/2022) 8.5 each 5   benzonatate (TESSALON) 100 MG capsule TAKE 1 CAPSULE BY MOUTH 2 TIMES DAILY AS NEEDED FOR COUGH. (Patient not taking: Reported on 05/01/2022) 20 capsule 0   ketoconazole (NIZORAL) 2 % cream Apply 1 application. topically daily. (Patient not taking: Reported on 12/18/2021) 15 g 0   montelukast (SINGULAIR) 10 MG tablet TAKE 1 TABLET BY MOUTH EVERYDAY AT BEDTIME (Patient not taking: Reported on 05/01/2022) 90 tablet 1   Polyethyl Glycol-Propyl Glycol (SYSTANE OP) Place 1 drop into both eyes daily as needed (dry eyes). (Patient not taking: Reported on 05/01/2022)     promethazine-dextromethorphan (PROMETHAZINE-DM) 6.25-15 MG/5ML syrup Take one teaspoon at bedtime , as needed, for cough (Patient not taking: Reported on 05/01/2022) 240 mL 0   triamcinolone cream (KENALOG) 0.1 % Apply 1 application. topically 2 (two) times daily. (Patient not taking: Reported on 12/18/2021) 30 g 0   Vitamin D, Ergocalciferol, (DRISDOL) 1.25 MG (50000 UNIT) CAPS capsule TAKE 1 CAPSULE BY MOUTH ONE TIME PER WEEK (Patient not taking: Reported on 05/01/2022) 12 capsule 1   Current Facility-Administered Medications  Medication Dose Route Frequency Provider Last Rate Last Admin   azelastine (ASTELIN) 0.1 % nasal spray 1 spray  1 spray Each Nare BID Rozetta Nunnery, MD        Allergies as of 05/01/2022 - Review Complete 05/01/2022  Allergen Reaction Noted   Aspirin Other (See Comments) 05/09/2014   Diazepam Other (See Comments)    Prednisone Other (See Comments) 02/21/2020    Family History  Problem Relation Age of Onset   Prostate cancer Father    Colon cancer Father        < 51 YO   Cancer Father        prostate and colon   Pancreatic cancer Mother    Cancer Mother        pancreas   Diabetes Mother    Diabetes Brother    Cancer Brother        prostate    Diabetes Sister        prediabetic   Heart disease Brother        CHF   Thyroid disease Neg Hx      Social History   Socioeconomic History   Marital status: Divorced    Spouse name: Not on file   Number of children: Not on file   Years of education: Not on file   Highest education level: Not on file  Occupational History   Occupation: fulltime at [employer]   Tobacco Use   Smoking status: Never   Smokeless tobacco: Never   Tobacco comments:    Never smoked  Vaping Use   Vaping Use: Never used  Substance and Sexual Activity   Alcohol use: Not Currently    Comment: occasional, maybe once/year   Drug use: No  Sexual activity: Not Currently    Birth control/protection: None  Other Topics Concern   Not on file  Social History Narrative   Not on file   Social Determinants of Health   Financial Resource Strain: Low Risk  (03/25/2022)   Overall Financial Resource Strain (CARDIA)    Difficulty of Paying Living Expenses: Not hard at all  Food Insecurity: No Food Insecurity (03/25/2022)   Hunger Vital Sign    Worried About Running Out of Food in the Last Year: Never true    Ran Out of Food in the Last Year: Never true  Transportation Needs: No Transportation Needs (03/25/2022)   PRAPARE - Hydrologist (Medical): No    Lack of Transportation (Non-Medical): No  Physical Activity: Insufficiently Active (03/25/2022)   Exercise Vital Sign    Days of Exercise per Week: 3 days    Minutes of Exercise per Session: 30 min  Stress: No Stress Concern Present (03/25/2022)   [place]    Feeling of Stress : Not at all  Social Connections: Moderately Integrated (03/25/2022)   Social Connection and Isolation Panel [NHANES]    Frequency of Communication with Friends and Family: Three times a week    Frequency of Social Gatherings with Friends and Family: More than three times a week    Attends Religious Services: More than 4 times per year    Active Member of [employer] or Organizations:  Yes    Attends Music therapist: More than 4 times per year    Marital Status: Divorced    Subjective: Review of Systems  Constitutional:  Negative for chills and fever.  HENT:  Negative for congestion and hearing loss.   Eyes:  Negative for blurred vision and double vision.  Respiratory:  Negative for cough and shortness of breath.   Cardiovascular:  Negative for chest pain and palpitations.  Gastrointestinal:  Positive for heartburn. Negative for abdominal pain, blood in stool, constipation, diarrhea, melena and vomiting.  Genitourinary:  Negative for dysuria and urgency.  Musculoskeletal:  Negative for joint pain and myalgias.  Skin:  Negative for itching and rash.  Neurological:  Negative for dizziness and headaches.  Psychiatric/Behavioral:  Negative for depression. The patient is not nervous/anxious.      Objective: BP 121/81   Pulse 86   Temp 97.7 F (36.5 C)   Ht '4\' 10"'$  (1.473 m)   Wt 157 lb 3.2 oz (71.3 kg)   BMI 32.85 kg/m  Physical Exam Constitutional:      Appearance: Normal appearance.  HENT:     Head: Normocephalic and atraumatic.  Eyes:     Extraocular Movements: Extraocular movements intact.     Conjunctiva/sclera: Conjunctivae normal.  Cardiovascular:     Rate and Rhythm: Normal rate and regular rhythm.  Pulmonary:     Effort: Pulmonary effort is normal.     Breath sounds: Normal breath sounds.  Abdominal:     General: Bowel sounds are normal.     Palpations: Abdomen is soft.  Musculoskeletal:        General: No swelling. Normal range of motion.     Cervical back: Normal range of motion and neck supple.  Skin:    General: Skin is warm and dry.     Coloration: Skin is not jaundiced.  Neurological:     General: No focal deficit present.     Mental Status: She is alert and oriented to person,  place, and time.  Psychiatric:        Mood and Affect: Mood normal.        Behavior: Behavior normal.      Assessment: *GERD-uncontrolled  with breakthrough symptoms *Hiatal hernia *Constipation  Plan: Given worsening breakthrough symptoms despite taking her omeprazole, Will schedule for EGD to evaluate for peptic ulcer disease, esophagitis, gastritis, H. Pylori, duodenitis, or other. Will also evaluate for esophageal stricture, Schatzki's ring, esophageal web or other.   The risks including infection, bleed, or perforation as well as benefits, limitations, alternatives and imponderables have been reviewed with the patient. Potential for esophageal dilation, biopsy, etc. have also been reviewed.  Questions have been answered. All parties agreeable.  Switch to Dexilant 60 mg daily.  Patient previously on pantoprazole as well.  Constipation well controlled.   05/01/2022 3:32 PM   Disclaimer: This note was dictated with voice recognition software. Similar sounding words can inadvertently be transcribed and may not be corrected upon review.

## 2022-05-01 NOTE — Patient Instructions (Addendum)
We will schedule you for EGD to further evaluate your worsening reflux.  I am also going to start you on Dexlansoprazole instead of Omeprazole. Take this in the AM 30 min before breakfast.   It was good seeing you again today.   Dr. Abbey Chatters

## 2022-05-01 NOTE — Progress Notes (Signed)
Referring Provider: Fayrene Helper, MD Primary Care Physician:  Fayrene Helper, MD Primary GI:  Dr. Abbey Chatters  Chief Complaint  Patient presents with   Follow-up    Follow up on her GERD, burning sensation in her throat    HPI:   Kim Grimes is a 71 y.o. female who presents to clinic today for follow-up visit.  Last seen March 2023.  History of chronic GERD which has historically been well controlled on omeprazole 1-2 times daily though recently this has worsened.  Notes feeling "bubbles" in her esophagus/epigastric region.  Notes breakthrough symptoms of burning.  Also notes associated burning of her right ear.  Previously on pantoprazole which did not help.  Was on Dexilant 10 years ago by way of samples, she is unsure if this helped or not.  No dysphagia odynophagia.  Underwent surveillance colonoscopy due to history of polyps in the past on 12/24/2021, 1 tubular adenoma removed, recall 2028.  Last EGD 01/19/2012, small hiatal hernia, gastritis, biopsies negative for H. pylori, fundic gland polyp.  History of constipation as well, states this is well controlled at present.  Past Medical History:  Diagnosis Date   Anxiety disorder    Carotid artery stenosis    Chronic pelvic pain in female    Coronary artery disease    Endometriosis    GERD (gastroesophageal reflux disease)    Helicobacter pylori gastritis    Dr. Olevia Perches    Hiatal hernia 2003   on UGI    Hypothyroidism 2009   IBS (irritable bowel syndrome) 2003   Obesity (BMI 30-39.9) DEC 2011 145 LBS   Osteoporosis    PONV (postoperative nausea and vomiting)     Past Surgical History:  Procedure Laterality Date   ABDOMINAL HYSTERECTOMY     adhesiolysis     dense adhesion between rectum and sigmoid,  & pelvis    BRAVO PH STUDY  01/19/2012   Procedure: BRAVO [place];  Surgeon: [name], MD;  [facility];  Service: Endoscopy;;   CATARACT EXTRACTION W/PHACO Left 06/03/2019   Procedure:  CATARACT EXTRACTION PHACO AND INTRAOCULAR LENS PLACEMENT LEFT EYE (CDE: 2.92);  Surgeon: [name], MD;  Location: [facility];  Service: Ophthalmology;  Laterality: Left;   COLONOSCOPY  03: [facility]/D & 08: SCREENING   WNL'S   COLONOSCOPY  07/28/2011   sessile polyp in ascending colon/internal hemorrhoids. Next colonoscopy January 2018   COLONOSCOPY N/A 08/01/2016   Procedure: COLONOSCOPY;  Surgeon: [name], MD;  [facility];  Service: Endoscopy;  Laterality: N/A;  930    COLONOSCOPY WITH PROPOFOL N/A 12/24/2021   Procedure: COLONOSCOPY WITH PROPOFOL;  Surgeon: Eloise Harman, DO;  [facility];  Service: Endoscopy;  Laterality: N/A;  9:00am   ESOPHAGOGASTRODUODENOSCOPY  01/19/2012   SLF: SMALL Hiatal hernia/Mild gastritis   EYE SURGERY Right 10/18/2013   catarat extraction   LAPAROSCOPY     OLECRANON BURSECTOMY Left 07/01/2018   Procedure: LEFT ELBOW OLECRANON BURSECTOMY AND SPUR EXCISION;  Surgeon: [name], MD;  Location: [facility];  Service: Orthopedics;  Laterality: Left;   POLYPECTOMY  12/24/2021   Procedure: POLYPECTOMY INTESTINAL;  Surgeon: Eloise Harman, DO;  [facility];  Service: Endoscopy;;   rt foot sx Bilateral    s/p hysterectomy     SALPINGOOPHORECTOMY     UPPER GASTROINTESTINAL ENDOSCOPY  DEC 2010: DYSPEPSIA/DYSPHAGIA   [place] RING/ESO DIL 16 MM, GASTRITIS/DUODENITIS 2o to Aleve/on OMP  PRN    Current Outpatient Medications  Medication Sig Dispense Refill   ipratropium (ATROVENT) 0.06 % nasal spray Place 2 sprays into both nostrils 2 (two) times daily as needed for allergies.     levothyroxine (SYNTHROID) 88 MCG tablet TAKE 1 TABLET BY MOUTH DAILY BEFORE BREAKFAST. 90 tablet 1   omeprazole (PRILOSEC) 40 MG capsule TAKE 30- 60 MIN BEFORE YOUR FIRST AND LAST MEALS OF THE DAY 180 capsule 1   albuterol (VENTOLIN HFA) 108 (90 Base) MCG/ACT inhaler TAKE 2 PUFFS BY MOUTH EVERY 6 HOURS AS NEEDED FOR WHEEZE OR SHORTNESS OF BREATH (Patient  not taking: Reported on 05/01/2022) 8.5 each 5   benzonatate (TESSALON) 100 MG capsule TAKE 1 CAPSULE BY MOUTH 2 TIMES DAILY AS NEEDED FOR COUGH. (Patient not taking: Reported on 05/01/2022) 20 capsule 0   ketoconazole (NIZORAL) 2 % cream Apply 1 application. topically daily. (Patient not taking: Reported on 12/18/2021) 15 g 0   montelukast (SINGULAIR) 10 MG tablet TAKE 1 TABLET BY MOUTH EVERYDAY AT BEDTIME (Patient not taking: Reported on 05/01/2022) 90 tablet 1   Polyethyl Glycol-Propyl Glycol (SYSTANE OP) Place 1 drop into both eyes daily as needed (dry eyes). (Patient not taking: Reported on 05/01/2022)     promethazine-dextromethorphan (PROMETHAZINE-DM) 6.25-15 MG/5ML syrup Take one teaspoon at bedtime , as needed, for cough (Patient not taking: Reported on 05/01/2022) 240 mL 0   triamcinolone cream (KENALOG) 0.1 % Apply 1 application. topically 2 (two) times daily. (Patient not taking: Reported on 12/18/2021) 30 g 0   Vitamin D, Ergocalciferol, (DRISDOL) 1.25 MG (50000 UNIT) CAPS capsule TAKE 1 CAPSULE BY MOUTH ONE TIME PER WEEK (Patient not taking: Reported on 05/01/2022) 12 capsule 1   Current Facility-Administered Medications  Medication Dose Route Frequency Provider Last Rate Last Admin   azelastine (ASTELIN) 0.1 % nasal spray 1 spray  1 spray Each Nare BID Rozetta Nunnery, MD        Allergies as of 05/01/2022 - Review Complete 05/01/2022  Allergen Reaction Noted   Aspirin Other (See Comments) 05/09/2014   Diazepam Other (See Comments)    Prednisone Other (See Comments) 02/21/2020    Family History  Problem Relation Age of Onset   Prostate cancer Father    Colon cancer Father        < 56 YO   Cancer Father        prostate and colon   Pancreatic cancer Mother    Cancer Mother        pancreas   Diabetes Mother    Diabetes Brother    Cancer Brother        prostate    Diabetes Sister        prediabetic   Heart disease Brother        CHF   Thyroid disease Neg Hx      Social History   Socioeconomic History   Marital status: Divorced    Spouse name: Not on file   Number of children: Not on file   Years of education: Not on file   Highest education level: Not on file  Occupational History   Occupation: fulltime at [employer]   Tobacco Use   Smoking status: Never   Smokeless tobacco: Never   Tobacco comments:    Never smoked  Vaping Use   Vaping Use: Never used  Substance and Sexual Activity   Alcohol use: Not Currently    Comment: occasional, maybe once/year   Drug use: No  Sexual activity: Not Currently    Birth control/protection: None  Other Topics Concern   Not on file  Social History Narrative   Not on file   Social Determinants of Health   Financial Resource Strain: Low Risk  (03/25/2022)   Overall Financial Resource Strain (CARDIA)    Difficulty of Paying Living Expenses: Not hard at all  Food Insecurity: No Food Insecurity (03/25/2022)   Hunger Vital Sign    Worried About Running Out of Food in the Last Year: Never true    Ran Out of Food in the Last Year: Never true  Transportation Needs: No Transportation Needs (03/25/2022)   PRAPARE - Hydrologist (Medical): No    Lack of Transportation (Non-Medical): No  Physical Activity: Insufficiently Active (03/25/2022)   Exercise Vital Sign    Days of Exercise per Week: 3 days    Minutes of Exercise per Session: 30 min  Stress: No Stress Concern Present (03/25/2022)   [place]    Feeling of Stress : Not at all  Social Connections: Moderately Integrated (03/25/2022)   Social Connection and Isolation Panel [NHANES]    Frequency of Communication with Friends and Family: Three times a week    Frequency of Social Gatherings with Friends and Family: More than three times a week    Attends Religious Services: More than 4 times per year    Active Member of [employer] or Organizations:  Yes    Attends Music therapist: More than 4 times per year    Marital Status: Divorced    Subjective: Review of Systems  Constitutional:  Negative for chills and fever.  HENT:  Negative for congestion and hearing loss.   Eyes:  Negative for blurred vision and double vision.  Respiratory:  Negative for cough and shortness of breath.   Cardiovascular:  Negative for chest pain and palpitations.  Gastrointestinal:  Positive for heartburn. Negative for abdominal pain, blood in stool, constipation, diarrhea, melena and vomiting.  Genitourinary:  Negative for dysuria and urgency.  Musculoskeletal:  Negative for joint pain and myalgias.  Skin:  Negative for itching and rash.  Neurological:  Negative for dizziness and headaches.  Psychiatric/Behavioral:  Negative for depression. The patient is not nervous/anxious.      Objective: BP 121/81   Pulse 86   Temp 97.7 F (36.5 C)   Ht '4\' 10"'$  (1.473 m)   Wt 157 lb 3.2 oz (71.3 kg)   BMI 32.85 kg/m  Physical Exam Constitutional:      Appearance: Normal appearance.  HENT:     Head: Normocephalic and atraumatic.  Eyes:     Extraocular Movements: Extraocular movements intact.     Conjunctiva/sclera: Conjunctivae normal.  Cardiovascular:     Rate and Rhythm: Normal rate and regular rhythm.  Pulmonary:     Effort: Pulmonary effort is normal.     Breath sounds: Normal breath sounds.  Abdominal:     General: Bowel sounds are normal.     Palpations: Abdomen is soft.  Musculoskeletal:        General: No swelling. Normal range of motion.     Cervical back: Normal range of motion and neck supple.  Skin:    General: Skin is warm and dry.     Coloration: Skin is not jaundiced.  Neurological:     General: No focal deficit present.     Mental Status: She is alert and oriented to person,  place, and time.  Psychiatric:        Mood and Affect: Mood normal.        Behavior: Behavior normal.      Assessment: *GERD-uncontrolled  with breakthrough symptoms *Hiatal hernia *Constipation  Plan: Given worsening breakthrough symptoms despite taking her omeprazole, Will schedule for EGD to evaluate for peptic ulcer disease, esophagitis, gastritis, H. Pylori, duodenitis, or other. Will also evaluate for esophageal stricture, Schatzki's ring, esophageal web or other.   The risks including infection, bleed, or perforation as well as benefits, limitations, alternatives and imponderables have been reviewed with the patient. Potential for esophageal dilation, biopsy, etc. have also been reviewed.  Questions have been answered. All parties agreeable.  Switch to Dexilant 60 mg daily.  Patient previously on pantoprazole as well.  Constipation well controlled.   05/01/2022 3:32 PM   Disclaimer: This note was dictated with voice recognition software. Similar sounding words can inadvertently be transcribed and may not be corrected upon review.

## 2022-05-20 ENCOUNTER — Encounter (HOSPITAL_COMMUNITY): Payer: Self-pay

## 2022-05-20 ENCOUNTER — Ambulatory Visit (HOSPITAL_COMMUNITY): Payer: PPO | Admitting: Anesthesiology

## 2022-05-20 ENCOUNTER — Encounter (HOSPITAL_COMMUNITY)
Admission: RE | Disposition: A | Discharge status: Home / Self Care | Payer: Self-pay | Source: Home / Self Care | Attending: Internal Medicine

## 2022-05-20 ENCOUNTER — Ambulatory Visit (HOSPITAL_BASED_OUTPATIENT_CLINIC_OR_DEPARTMENT_OTHER): Payer: PPO | Admitting: Anesthesiology

## 2022-05-20 ENCOUNTER — Other Ambulatory Visit: Payer: Self-pay

## 2022-05-20 ENCOUNTER — Ambulatory Visit (HOSPITAL_COMMUNITY)
Admission: RE | Admit: 2022-05-20 | Discharge: 2022-05-20 | Disposition: A | Discharge status: Home / Self Care | Payer: PPO | Attending: Internal Medicine | Admitting: Internal Medicine

## 2022-05-20 DIAGNOSIS — R1013 Epigastric pain: Secondary | ICD-10-CM | POA: Insufficient documentation

## 2022-05-20 DIAGNOSIS — K317 Polyp of stomach and duodenum: Secondary | ICD-10-CM

## 2022-05-20 DIAGNOSIS — K219 Gastro-esophageal reflux disease without esophagitis: Secondary | ICD-10-CM | POA: Diagnosis not present

## 2022-05-20 DIAGNOSIS — G709 Myoneural disorder, unspecified: Secondary | ICD-10-CM | POA: Diagnosis not present

## 2022-05-20 DIAGNOSIS — K449 Diaphragmatic hernia without obstruction or gangrene: Secondary | ICD-10-CM | POA: Insufficient documentation

## 2022-05-20 DIAGNOSIS — I2510 Atherosclerotic heart disease of native coronary artery without angina pectoris: Secondary | ICD-10-CM | POA: Insufficient documentation

## 2022-05-20 DIAGNOSIS — F419 Anxiety disorder, unspecified: Secondary | ICD-10-CM | POA: Insufficient documentation

## 2022-05-20 DIAGNOSIS — K2970 Gastritis, unspecified, without bleeding: Secondary | ICD-10-CM | POA: Diagnosis not present

## 2022-05-20 DIAGNOSIS — K319 Disease of stomach and duodenum, unspecified: Secondary | ICD-10-CM | POA: Diagnosis not present

## 2022-05-20 DIAGNOSIS — E039 Hypothyroidism, unspecified: Secondary | ICD-10-CM | POA: Diagnosis not present

## 2022-05-20 HISTORY — PX: ESOPHAGOGASTRODUODENOSCOPY (EGD) WITH PROPOFOL: SHX5813

## 2022-05-20 HISTORY — PX: BIOPSY: SHX5522

## 2022-05-20 SURGERY — ESOPHAGOGASTRODUODENOSCOPY (EGD) WITH PROPOFOL
Anesthesia: General

## 2022-05-20 MED ORDER — PROPOFOL 10 MG/ML IV BOLUS
INTRAVENOUS | Status: DC | PRN
  Administered 2022-05-20: 20 mg via INTRAVENOUS
  Administered 2022-05-20: 80 mg via INTRAVENOUS

## 2022-05-20 MED ORDER — LACTATED RINGERS IV SOLN: INTRAVENOUS | Status: DC

## 2022-05-20 MED ORDER — STERILE WATER FOR IRRIGATION IR SOLN
Status: DC | PRN
  Administered 2022-05-20: 120 mL

## 2022-05-20 MED ORDER — LIDOCAINE HCL (CARDIAC) PF 100 MG/5ML IV SOSY
PREFILLED_SYRINGE | INTRAVENOUS | Status: DC | PRN
  Administered 2022-05-20: 50 mg via INTRATRACHEAL

## 2022-05-20 NOTE — Transfer of Care (Signed)
Immediate Anesthesia Transfer of Care Note  Patient: Kim Grimes  Procedure(s) Performed: ESOPHAGOGASTRODUODENOSCOPY (EGD) WITH PROPOFOL BIOPSY  Patient Location: Endoscopy Unit  Anesthesia Type:General  Level of Consciousness: awake, alert , oriented, and patient cooperative  Airway & Oxygen Therapy: Patient Spontanous Breathing  Post-op Assessment: Report given to RN, Post -op Vital signs reviewed and stable, and Patient moving all extremities  Post vital signs: Reviewed and stable  Last Vitals:  Vitals Value Taken Time  BP    Temp    Pulse    Resp    SpO2      Last Pain:  Vitals:   05/20/22 0852  TempSrc:   PainSc: 5          Complications: No notable events documented.

## 2022-05-20 NOTE — Interval H&P Note (Signed)
History and Physical Interval Note:  05/20/2022 8:15 AM  Kim Grimes  has presented today for surgery, with the diagnosis of GERD.  The various methods of treatment have been discussed with the patient and family. After consideration of risks, benefits and other options for treatment, the patient has consented to  Procedure(s) with comments: ESOPHAGOGASTRODUODENOSCOPY (EGD) WITH PROPOFOL (N/A) - 12:45 pm, pt knows to arrive at 7:00 as a surgical intervention.  The patient's history has been reviewed, patient examined, no change in status, stable for surgery.  I have reviewed the patient's chart and labs.  Questions were answered to the patient's satisfaction.     Eloise Harman

## 2022-05-20 NOTE — Discharge Instructions (Addendum)
EGD Discharge instructions Please read the instructions outlined below and refer to this sheet in the next few weeks. These discharge instructions provide you with general information on caring for yourself after you leave the hospital. Your doctor may also give you specific instructions. While your treatment has been planned according to the most current medical practices available, unavoidable complications occasionally occur. If you have any problems or questions after discharge, please call your doctor. ACTIVITY You may resume your regular activity but move at a slower pace for the next 24 hours.  Take frequent rest periods for the next 24 hours.  Walking will help expel (get rid of) the air and reduce the bloated feeling in your abdomen.  No driving for 24 hours (because of the anesthesia (medicine) used during the test).  You may shower.  Do not sign any important legal documents or operate any machinery for 24 hours (because of the anesthesia used during the test).  NUTRITION Drink plenty of fluids.  You may resume your normal diet.  Begin with a light meal and progress to your normal diet.  Avoid alcoholic beverages for 24 hours or as instructed by your caregiver.  MEDICATIONS You may resume your normal medications unless your caregiver tells you otherwise.  WHAT YOU CAN EXPECT TODAY You may experience abdominal discomfort such as a feeling of fullness or "gas" pains.  FOLLOW-UP Your doctor will discuss the results of your test with you.  SEEK IMMEDIATE MEDICAL ATTENTION IF ANY OF THE FOLLOWING OCCUR: Excessive nausea (feeling sick to your stomach) and/or vomiting.  Severe abdominal pain and distention (swelling).  Trouble swallowing.  Temperature over 101 F (37.8 C).  Rectal bleeding or vomiting of blood.   Your EGD revealed mild amount inflammation in your stomach.  I took biopsies of this to rule out infection with a bacteria called H. pylori.  Await pathology results, my  office will contact you.  You also had a few polyps in your stomach, these are likely benign.  I did sample a few of the larger ones.  Small hiatal hernia in your esophagus.  Small bowel was normal.  Continue Dexilant 60 mg daily.  Agree with taking your thyroid medication at least 30 minutes before taking Dexilant.  Limit NSAID use.  Follow-up with GI in 3 months.    OFFICE WILL CALL WITH APPOINTMENT OR SEND LETTER   I hope you have a great rest of your week!  Elon Alas. Abbey Chatters, D.O. Gastroenterology and Hepatology [facility]

## 2022-05-20 NOTE — Op Note (Signed)
[facility] Patient Name: Kim Grimes Procedure Date: 05/20/2022 8:44 AM MRN: [id number] Date of Birth: 1951-02-21 Attending MD: Elon Alas. Abbey Chatters , [place], [id number] CSN: [id number] Age: 71 Admit Type: Outpatient Procedure:                Upper GI endoscopy Indications:              Epigastric abdominal pain, Heartburn Providers:                Elon Alas. Abbey Chatters, DO, Caprice Kluver, [place], Technician Referring MD:              Medicines:                See the Anesthesia note for documentation of the                            administered medications Complications:            No immediate complications. Estimated Blood Loss:     Estimated blood loss was minimal. Procedure:                Pre-Anesthesia Assessment:                           - The anesthesia plan was to use monitored                            anesthesia care (MAC).                           After obtaining informed consent, the endoscope was                            passed under direct vision. Throughout the                            procedure, the patient's blood pressure, pulse, and                            oxygen saturations were monitored continuously. The                            [id number]-H190 ([id number]) scope was introduced through the                            mouth, and advanced to the second part of duodenum.                            The upper GI endoscopy was accomplished without                            difficulty. The patient tolerated the procedure                            well. Scope In:  8:56:54 AM Scope Out: 9:03:21 AM Total Procedure Duration: 0 hours 6 minutes 27 seconds  Findings:      A small hiatal hernia was present.      Scattered mild inflammation characterized by erythema was found in the       gastric body. Biopsies were taken with a cold forceps for Helicobacter       pylori testing.      Multiple 3 to 8 mm sessile polyps with no  bleeding and no stigmata of       recent bleeding were found in the gastric fundus and in the gastric       body. Multiple sampled with a cold forceps for histology. Likely benign       fundic glnd polyps.      The duodenal bulb, first portion of the duodenum and second portion of       the duodenum were normal. Impression:               - Small hiatal hernia.                           - Gastritis. Biopsied.                           - Multiple gastric polyps. Biopsied.                           - Normal duodenal bulb, first portion of the                            duodenum and second portion of the duodenum. Moderate Sedation:      Per Anesthesia Care Recommendation:           - Patient has a contact number available for                            emergencies. The signs and symptoms of potential                            delayed complications were discussed with the                            patient. Return to normal activities tomorrow.                            Written discharge instructions were provided to the                            patient.                           - Resume previous diet.                           - Continue present medications.                           - Await pathology results.                           -  Use Dexilant (dexlansoprazole) 60 mg PO daily.                           - No ibuprofen, naproxen, or other non-steroidal                            anti-inflammatory drugs.                           - Return to GI clinic in 3 months. Procedure Code(s):        --- Professional ---                           [phone], Esophagogastroduodenoscopy, flexible,                            transoral; with biopsy, single or multiple Diagnosis Code(s):        --- Professional ---                           K44.9, Diaphragmatic hernia without obstruction or                            gangrene                           K29.70, Gastritis, unspecified, without  bleeding                           K31.7, Polyp of stomach and duodenum                           R10.13, Epigastric pain                           R12, Heartburn CPT copyright 2022 American Medical Association. All rights reserved. The codes documented in this report are preliminary and upon coder review may  be revised to meet current compliance requirements. Elon Alas. Abbey Chatters, DO [place] Abbey Chatters, DO 05/20/2022 9:08:24 AM This report has been signed electronically. Number of Addenda: 0

## 2022-05-20 NOTE — Anesthesia Preprocedure Evaluation (Signed)
Anesthesia Evaluation  Patient identified by MRN, date of birth, ID band Patient awake    Reviewed: Allergy & Precautions, H&P , NPO status , Patient's Chart, lab work & pertinent test results, reviewed documented beta blocker date and time   History of Anesthesia Complications (+) PONV and history of anesthetic complications  Airway Mallampati: II  TM Distance: >3 FB Neck ROM: full    Dental no notable dental hx.    Pulmonary neg pulmonary ROS   Pulmonary exam normal breath sounds clear to auscultation       Cardiovascular Exercise Tolerance: Good + CAD   Rhythm:regular Rate:Normal     Neuro/Psych  PSYCHIATRIC DISORDERS Anxiety      Neuromuscular disease    GI/Hepatic Neg liver ROS, hiatal hernia,GERD  Medicated,,  Endo/Other  Hypothyroidism    Renal/GU negative Renal ROS  negative genitourinary   Musculoskeletal   Abdominal   Peds  Hematology negative hematology ROS (+)   Anesthesia Other Findings   Reproductive/Obstetrics negative OB ROS                              Anesthesia Physical Anesthesia Plan  ASA: 2  Anesthesia Plan: General   Post-op Pain Management:    Induction:   PONV Risk Score and Plan: Propofol infusion  Airway Management Planned:   Additional Equipment:   Intra-op Plan:   Post-operative Plan:   Informed Consent: I have reviewed the patients History and Physical, chart, labs and discussed the procedure including the risks, benefits and alternatives for the proposed anesthesia with the patient or authorized representative who has indicated his/her understanding and acceptance.     Dental Advisory Given  Plan Discussed with: CRNA  Anesthesia Plan Comments:          Anesthesia Quick Evaluation

## 2022-05-21 LAB — SURGICAL PATHOLOGY

## 2022-05-21 NOTE — Anesthesia Postprocedure Evaluation (Signed)
Anesthesia Post Note  Patient: Kim Grimes  Procedure(s) Performed: ESOPHAGOGASTRODUODENOSCOPY (EGD) WITH PROPOFOL BIOPSY  Patient location during evaluation: Phase II Anesthesia Type: General Level of consciousness: awake Pain management: pain level controlled Vital Signs Assessment: post-procedure vital signs reviewed and stable Respiratory status: spontaneous breathing and respiratory function stable Cardiovascular status: blood pressure returned to baseline and stable Postop Assessment: no headache and no apparent nausea or vomiting Anesthetic complications: no Comments: Late entry   No notable events documented.   Last Vitals:  Vitals:   05/20/22 0719 05/20/22 0907  BP: (!) 147/89 116/60  Pulse: 74 76  Resp: 20 17  Temp: 36.6 C 36.5 C  SpO2: 97% 99%    Last Pain:  Vitals:   05/20/22 0907  TempSrc: Oral  PainSc: [place]

## 2022-05-27 ENCOUNTER — Ambulatory Visit: Payer: PPO | Admitting: Family Medicine

## 2022-05-28 ENCOUNTER — Encounter (HOSPITAL_COMMUNITY): Payer: Self-pay | Admitting: Internal Medicine

## 2022-05-29 DIAGNOSIS — J310 Chronic rhinitis: Secondary | ICD-10-CM | POA: Diagnosis not present

## 2022-05-29 DIAGNOSIS — R0982 Postnasal drip: Secondary | ICD-10-CM | POA: Diagnosis not present

## 2022-05-29 DIAGNOSIS — J343 Hypertrophy of nasal turbinates: Secondary | ICD-10-CM | POA: Diagnosis not present

## 2022-06-03 ENCOUNTER — Encounter: Payer: PPO | Admitting: Family Medicine

## 2022-06-25 ENCOUNTER — Encounter: Payer: Self-pay | Admitting: Family Medicine

## 2022-06-25 ENCOUNTER — Ambulatory Visit (INDEPENDENT_AMBULATORY_CARE_PROVIDER_SITE_OTHER): Payer: PPO | Admitting: Family Medicine

## 2022-06-25 VITALS — BP 118/77 | HR 77 | Ht <= 58 in | Wt 152.0 lb

## 2022-06-25 DIAGNOSIS — Z0000 Encounter for general adult medical examination without abnormal findings: Secondary | ICD-10-CM | POA: Diagnosis not present

## 2022-06-25 DIAGNOSIS — Z13220 Encounter for screening for lipoid disorders: Secondary | ICD-10-CM | POA: Diagnosis not present

## 2022-06-25 DIAGNOSIS — R051 Acute cough: Secondary | ICD-10-CM | POA: Diagnosis not present

## 2022-06-25 DIAGNOSIS — J0110 Acute frontal sinusitis, unspecified: Secondary | ICD-10-CM | POA: Insufficient documentation

## 2022-06-25 DIAGNOSIS — H6501 Acute serous otitis media, right ear: Secondary | ICD-10-CM

## 2022-06-25 MED ORDER — AMOXICILLIN-POT CLAVULANATE 875-125 MG PO TABS: 1.0000 | ORAL_TABLET | Freq: Two times a day (BID) | ORAL | 0 refills | Status: DC

## 2022-06-25 MED ORDER — LEVOTHYROXINE SODIUM 88 MCG PO TABS: 88.0000 ug | ORAL_TABLET | Freq: Every day | ORAL | 0 refills | Status: DC

## 2022-06-25 NOTE — Patient Instructions (Signed)
F/u in 6 months, call if you need me sooner  Your blood pressure is NORMAL   Nurse please send for recent eye exams, ( [address]) , and TdAP from [facility]  You are treated for acute sinusitis, right otitis media and cough. Take tessalon perles that you have one twice daily for next 1 week  Augmentin ( an antibiotic) is prescribed for 10 days, please take entire course.  Commit to regular weight bearing exercise, like walking, to strengthen bones  Please lower fried and fatty food intake  Fasting lipid panel 3 to 5 days  before next appt  Thanks for choosing [facility], we consider it a privelige to serve you.

## 2022-06-25 NOTE — Progress Notes (Unsigned)
    Kim Grimes     MRN: [id number]      DOB: [birth year]  HPI: Patient is in for annual physical exam. sit. Recent labs,  are reviewed. Immunization is reviewed , and  updated if needed.   PE: BP 118/77   Pulse 77   Ht '4\' 10"'$  (1.473 m)   Wt 152 lb (68.9 kg)   SpO2 95%   BMI 31.77 kg/m   Pleasant  female, alert and oriented x 3, in no cardio-pulmonary distress. Afebrile. HEENT No facial trauma or asymetry. Sinuses non tender.  Extra occullar muscles intact.. External ears normal, . Neck: supple, no adenopathy,JVD or thyromegaly.No bruits.  Chest: Clear to ascultation bilaterally.No crackles or wheezes. Non tender to palpation  Breast: No asymetry,no masses or lumps. No tenderness. No nipple discharge or inversion. No axillary or supraclavicular adenopathy  Cardiovascular system; Heart sounds normal,  S1 and  S2 ,no S3.  No murmur, or thrill. Apical beat not displaced Peripheral pulses normal.  Abdomen: Soft, non tender, no organomegaly or masses. No bruits. Bowel sounds normal. No guarding, tenderness or rebound.   GU: External genitalia normal female genitalia , normal female distribution of hair. No lesions. Urethral meatus normal in size, no  Prolapse, no lesions visibly  Present. Bladder non tender. Vagina pink and moist , with no visible lesions , discharge present . Adequate pelvic support no  cystocele or rectocele noted Cervix pink and appears healthy, no lesions or ulcerations noted, no discharge noted from os Uterus normal size, no adnexal masses, no cervical motion or adnexal tenderness.   Musculoskeletal exam: Full ROM of spine, hips , shoulders and knees. No deformity ,swelling or crepitus noted. No muscle wasting or atrophy.   Neurologic: Cranial nerves 2 to 12 intact. Power, tone ,sensation and reflexes normal throughout. No disturbance in gait. No tremor.  Skin: Intact, no ulceration, erythema , scaling or rash  noted. Pigmentation normal throughout  Psych; Normal mood and affect. Judgement and concentration normal   Assessment & Plan:  No problem-specific Assessment & Plan notes found for this encounter.

## 2022-06-26 DIAGNOSIS — Z0000 Encounter for general adult medical examination without abnormal findings: Secondary | ICD-10-CM | POA: Insufficient documentation

## 2022-06-26 NOTE — Assessment & Plan Note (Signed)
Tessalon perles prescribed short term

## 2022-06-26 NOTE — Assessment & Plan Note (Signed)
Augmentin x 10 days prescribed

## 2022-06-26 NOTE — Assessment & Plan Note (Signed)
Annual exam as documented. . Immunization and cancer screening needs are specifically addressed at this visit.  

## 2022-06-26 NOTE — Assessment & Plan Note (Signed)
Augmentin x 10 days is prescribed

## 2022-07-04 ENCOUNTER — Encounter: Payer: PPO | Admitting: Family Medicine

## 2022-07-04 ENCOUNTER — Telehealth: Payer: Self-pay | Admitting: Family Medicine

## 2022-07-04 NOTE — Telephone Encounter (Signed)
Pls let her know that I received and reviewed the recent oV from her eye specialist, it did report that hse had a spontaneous bleed in her left eye (subconjunctival hemorrhage) This has resolved on it's own Her blood pressure is normal

## 2022-07-07 ENCOUNTER — Ambulatory Visit (INDEPENDENT_AMBULATORY_CARE_PROVIDER_SITE_OTHER): Payer: PPO | Admitting: Internal Medicine

## 2022-07-07 ENCOUNTER — Encounter: Payer: Self-pay | Admitting: Internal Medicine

## 2022-07-07 VITALS — BP 120/74 | HR 78 | Ht <= 58 in | Wt 152.0 lb

## 2022-07-07 DIAGNOSIS — E039 Hypothyroidism, unspecified: Secondary | ICD-10-CM

## 2022-07-07 MED ORDER — LEVOTHYROXINE SODIUM 88 MCG PO TABS: 88.0000 ug | ORAL_TABLET | Freq: Every day | ORAL | 5 refills | Status: DC

## 2022-07-07 NOTE — Progress Notes (Signed)
Patient ID: Kim Grimes, female   DOB: 21-Jul-1950, 72 y.o.   MRN: [id number]  HPI  Kim Grimes is a 72 y.o.-year-old female, returning for follow-up for hypothyroidism.  She previously saw Dr. Loanne Drilling, last visit with him approximately 7 months ago. She saw DR. Nida before.  Pt. has been dx with hypothyroidism in 2011 by Dr. Garwin Brothers 2/2 fatigue >> on generic levothyroxine.  Pt is on levothyroxine 88 mcg daily, taken: - sometimes she forgets - 2x a week - keeps LT4 on the counter in the kitchen - in am (5 am) -when walking out the door to go to work - coffee + creamer 3h later - fasting - at least 3h from b'fast - no calcium - no iron - no multivitamins - + PPIs (Dexilant) at 8 am - not on Biotin  I reviewed pt's thyroid tests: Lab Results  Component Value Date   TSH 1.20 11/04/2021   TSH 3.70 05/03/2021   TSH 1.95 07/12/2020   TSH 4.01 04/10/2020   TSH 0.114 (L) 11/04/2019   TSH 0.504 07/04/2019   TSH 1.750 07/02/2018   TSH 5.270 (H) 08/12/2016   TSH 2.890 02/28/2016   TSH 6.900 (H) 10/04/2015   FREET4 1.25 11/04/2021   FREET4 1.2 05/03/2021   FREET4 1.30 07/12/2020   FREET4 0.91 04/10/2020   FREET4 1.84 (H) 11/04/2019   FREET4 1.95 (H) 07/04/2019   FREET4 1.28 07/02/2018   FREET4 1.03 08/12/2016   FREET4 1.14 02/28/2016   Antithyroid antibodies: No results found for: "THGAB" No components found for: "TPOAB"  Pt describes: - + weight gain despite exercise and not overeating - + heat intolerance  No: - fatigue - cold intolerance - depression - constipation - dry skin - hair loss  Pt denies: - feeling nodules in neck - dysphagia - choking - SOB with lying down Occasional hoarseness - sinus drainage.  She has no FH of thyroid disorders. No FH of thyroid cancer.  No h/o radiation tx to head or neck. No recent use of iodine supplements. No recent steroid use. No use of herbal supplements.  Pt. also has a history of CAD and carotid  artery stenosis, hyperlipidemia, GERD, hiatal hernia, IBS, vitamin D deficiency, osteoporosis.  ROS: + see HPI  Past Medical History:  Diagnosis Date   Anxiety disorder    Carotid artery stenosis    Chronic pelvic pain in female    Coronary artery disease    Endometriosis    GERD (gastroesophageal reflux disease)    Helicobacter pylori gastritis    Dr. Olevia Perches    Hiatal hernia 2003   on UGI    Hypothyroidism 2009   IBS (irritable bowel syndrome) 2003   Obesity (BMI 30-39.9) DEC 2011 145 LBS   Osteoporosis    PONV (postoperative nausea and vomiting)    Past Surgical History:  Procedure Laterality Date   ABDOMINAL HYSTERECTOMY     adhesiolysis     dense adhesion between rectum and sigmoid,  & pelvis    BIOPSY  05/20/2022   Procedure: BIOPSY;  Surgeon: Eloise Harman, DO;  [facility];  Service: Endoscopy;;   BRAVO [place] STUDY  01/19/2012   Procedure: BRAVO [place];  Surgeon: [name], MD;  Location: [facility];  Service: Endoscopy;;   CATARACT EXTRACTION W/PHACO Left 06/03/2019   Procedure: CATARACT EXTRACTION PHACO AND INTRAOCULAR LENS PLACEMENT LEFT EYE (CDE: 2.92);  Surgeon: [name], MD;  Location: [facility];  Service: Ophthalmology;  Laterality: Left;   COLONOSCOPY  03: [facility]/D & 08: SCREENING   WNL'S   COLONOSCOPY  07/28/2011   sessile polyp in ascending colon/internal hemorrhoids. Next colonoscopy January 2018   COLONOSCOPY N/A 08/01/2016   Procedure: COLONOSCOPY;  Surgeon: [name], MD;  [facility];  Service: Endoscopy;  Laterality: N/A;  930    COLONOSCOPY WITH PROPOFOL N/A 12/24/2021   Procedure: COLONOSCOPY WITH PROPOFOL;  Surgeon: Eloise Harman, DO;  [facility];  Service: Endoscopy;  Laterality: N/A;  9:00am   ESOPHAGOGASTRODUODENOSCOPY  01/19/2012   SLF: SMALL Hiatal hernia/Mild gastritis   ESOPHAGOGASTRODUODENOSCOPY (EGD) WITH PROPOFOL N/A 05/20/2022   Procedure: ESOPHAGOGASTRODUODENOSCOPY (EGD) WITH PROPOFOL;   Surgeon: Eloise Harman, DO;  [facility];  Service: Endoscopy;  Laterality: N/A;  12:45 pm, pt knows to arrive at 7:00   EYE SURGERY Right 10/18/2013   catarat extraction   LAPAROSCOPY     OLECRANON BURSECTOMY Left 07/01/2018   Procedure: LEFT ELBOW OLECRANON BURSECTOMY AND SPUR EXCISION;  Surgeon: [name], MD;  Location: [facility];  Service: Orthopedics;  Laterality: Left;   POLYPECTOMY  12/24/2021   Procedure: POLYPECTOMY INTESTINAL;  Surgeon: Eloise Harman, DO;  [facility];  Service: Endoscopy;;   rt foot sx Bilateral    s/p hysterectomy     SALPINGOOPHORECTOMY     UPPER GASTROINTESTINAL ENDOSCOPY  DEC 2010: DYSPEPSIA/DYSPHAGIA   [place] RING/ESO DIL 16 MM, GASTRITIS/DUODENITIS 2o to [place]/on OMP PRN   Social History   Socioeconomic History   Marital status: Divorced    Spouse name: Not on file   Number of children: Not on file   Years of education: Not on file   Highest education level: Not on file  Occupational History   Occupation: fulltime at [employer]   Tobacco Use   Smoking status: Never   Smokeless tobacco: Never   Tobacco comments:    Never smoked  Vaping Use   Vaping Use: Never used  Substance and Sexual Activity   Alcohol use: Not Currently    Comment: occasional, maybe once/year   Drug use: No   Sexual activity: Not Currently    Birth control/protection: None  Other Topics Concern   Not on file  Social History Narrative   Not on file   Social Determinants of Health   Financial Resource Strain: Low Risk  (03/25/2022)   Overall Financial Resource Strain (CARDIA)    Difficulty of Paying Living Expenses: Not hard at all  Food Insecurity: No Food Insecurity (03/25/2022)   Hunger Vital Sign    Worried About Running Out of Food in the Last Year: Never true    Ran Out of Food in the Last Year: Never true  Transportation Needs: No Transportation Needs (03/25/2022)   PRAPARE - Radiographer, therapeutic (Medical): No    Lack of Transportation (Non-Medical): No  Physical Activity: Insufficiently Active (03/25/2022)   Exercise Vital Sign    Days of Exercise per Week: 3 days    Minutes of Exercise per Session: 30 min  Stress: No Stress Concern Present (03/25/2022)   [place]    Feeling of Stress : Not at all  Social Connections: Moderately Integrated (03/25/2022)   Social Connection and Isolation Panel [NHANES]    Frequency of Communication with Friends and Family: Three times a week    Frequency of Social Gatherings with Friends and Family: More  than three times a week    Attends Religious Services: More than 4 times per year    Active Member of Clubs or Organizations: Yes    Attends Archivist Meetings: More than 4 times per year    Marital Status: Divorced  Intimate Partner Violence: Not At Risk (03/25/2022)   Humiliation, Afraid, Rape, and Kick questionnaire    Fear of Current or Ex-Partner: No    Emotionally Abused: No    Physically Abused: No    Sexually Abused: No   Current Outpatient Medications on File Prior to Visit  Medication Sig Dispense Refill   amoxicillin-clavulanate (AUGMENTIN) 875-125 MG tablet Take 1 tablet by mouth 2 (two) times daily. 20 tablet 0   dexlansoprazole (DEXILANT) 60 MG capsule Take 1 capsule (60 mg total) by mouth daily. 90 capsule 3   erythromycin ophthalmic ointment 4 (four) times daily.     levothyroxine (SYNTHROID) 88 MCG tablet Take 1 tablet (88 mcg total) by mouth daily. 30 tablet 0   montelukast (SINGULAIR) 10 MG tablet TAKE 1 TABLET BY MOUTH EVERYDAY AT BEDTIME (Patient taking differently: Take 10 mg by mouth daily as needed (allergies).) 90 tablet 1   Current Facility-Administered Medications on File Prior to Visit  Medication Dose Route Frequency Provider Last Rate Last Admin   azelastine (ASTELIN) 0.1 % nasal spray 1 spray  1 spray Each Nare BID Rozetta Nunnery, MD       Allergies  Allergen Reactions   Aspirin Other (See Comments)    "blood rushing"   Diazepam Other (See Comments)    Agitation   Prednisone Other (See Comments)    Any type of steroids. Pt states she has seen what they do to other people and she just doesn't take them   Family History  Problem Relation Age of Onset   Prostate cancer Father    Colon cancer Father        < 32 YO   Cancer Father        prostate and colon   Pancreatic cancer Mother    Cancer Mother        pancreas   Diabetes Mother    Diabetes Brother    Cancer Brother        prostate    Diabetes Sister        prediabetic   Heart disease Brother        CHF   Thyroid disease Neg Hx    PE: BP 120/74 (BP Location: Right Arm, Patient Position: Sitting, Cuff Size: Normal)   Pulse 78   Ht '4\' 10"'$  (1.473 m)   Wt 152 lb (68.9 kg)   SpO2 99%   BMI 31.77 kg/m  Wt Readings from Last 3 Encounters:  07/07/22 152 lb (68.9 kg)  06/25/22 152 lb (68.9 kg)  05/20/22 148 lb (67.1 kg)   Constitutional:  normal weight, in NAD Eyes:  EOMI, no exophthalmos ENT: no neck masses, no cervical lymphadenopathy Cardiovascular: RRR, No MRG Respiratory: CTA B Musculoskeletal: no deformities Skin:no rashes Neurological: no tremor with outstretched hands  ASSESSMENT: 1. Hypothyroidism  PLAN:  1. Patient with long-standing hypothyroidism, on levothyroxine therapy. - she appears euthyroid.  - she does not appear to have a goiter, thyroid nodules, or neck compression symptoms - latest thyroid labs reviewed with pt. >> normal: Lab Results  Component Value Date   TSH 1.20 11/04/2021  - she continues on LT4 88 mcg daily - pt feels good on this dose but  she does complain of weight gain. - we discussed about taking the thyroid hormone every day, with water, >30 minutes before breakfast, separated by >4 hours from acid reflux medications, calcium, iron, multivitamins. Pt. is missing many doses, even to a week  and, to improve compliance, I advised her to move the levothyroxine from the kitchen counter to her nightstand.  I advised her to take this first thing in the morning.  She has to take Dexilant, but if she takes the levothyroxine at 4:30 AM, she can take this at 8 AM, approximately 30 minutes before eating breakfast.  Otherwise, she is not on medications that can interfere with the absorption of levothyroxine. -For now, I refilled the same thyroxine dose for her and then we can recheck her labs - will check thyroid tests in 5-6 weeks: TSH and fT4 - OTW, I will see her back in a year  Orders Placed This Encounter  Procedures   TSH   T4, free   Thyroglobulin antibody   Thyroid peroxidase antibody   Philemon Kingdom, MD PhD [facility] End[facility]

## 2022-07-07 NOTE — Patient Instructions (Addendum)
Please continue Levothyroxine 88 mcg daily.  Take the thyroid hormone every day, with water, at least 30 minutes before breakfast, separated by at least 4 hours from: - acid reflux medications - calcium - iron - multivitamins  Please place the Levothyroxine on the nightstand.  Come back for labs in 5-6 weeks.  You should have an endocrinology follow-up appointment in 6 months.

## 2022-07-08 NOTE — Telephone Encounter (Signed)
Patient aware.

## 2022-07-18 ENCOUNTER — Telehealth: Payer: Self-pay | Admitting: Family Medicine

## 2022-07-18 NOTE — Telephone Encounter (Signed)
Pt called wanting to know if she could please get a referral to [facility] in [place]?

## 2022-07-30 ENCOUNTER — Encounter: Payer: Self-pay | Admitting: Family Medicine

## 2022-07-30 ENCOUNTER — Ambulatory Visit (INDEPENDENT_AMBULATORY_CARE_PROVIDER_SITE_OTHER): Payer: PPO | Admitting: Family Medicine

## 2022-07-30 VITALS — BP 127/84 | HR 78 | Ht <= 58 in | Wt 153.1 lb

## 2022-07-30 DIAGNOSIS — K219 Gastro-esophageal reflux disease without esophagitis: Secondary | ICD-10-CM

## 2022-07-30 DIAGNOSIS — M79675 Pain in left toe(s): Secondary | ICD-10-CM | POA: Insufficient documentation

## 2022-07-30 NOTE — Assessment & Plan Note (Addendum)
1 year h/o of intermittent pain and swelling of left grerat toe, nail is thickened and discolored, nailbed is red and tender, reports  intermittent " gout  flares " specifically on distal iP of left great toe

## 2022-07-30 NOTE — Patient Instructions (Addendum)
F/U  as before, call if you need me sooner  You are referred to Podiatry releft great toe, please get appt info at checkout from [facility]  Thanks for choosing [facility], we consider it a privelige to serve you.

## 2022-08-01 ENCOUNTER — Encounter: Payer: Self-pay | Admitting: Family Medicine

## 2022-08-01 NOTE — Assessment & Plan Note (Signed)
Controlled, no change in medication  

## 2022-08-01 NOTE — Progress Notes (Signed)
   Kim Grimes     MRN: [id number]      DOB: [birth year]   HPI Kim Grimes is here with c/o left great toe pain and deformity following trauma over 12 months ago when an object fell on her toe, wants referral to Podiatry as condition is worsening  ROS See HPI  Denies recent fever or chills. Denies sinus pressure, nasal congestion, ear pain or sore throat. Denies chest congestion, productive cough or wheezing. Denies chest pains, palpitations and leg swelling  Denies depression, anxiety or insomnia. Denies skin break down or rash.   PE  BP 127/84 (BP Location: Right Arm, Patient Position: Sitting, Cuff Size: Large)   Pulse 78   Ht '4\' 10"'$  (1.473 m)   Wt 153 lb 1.9 oz (69.5 kg)   SpO2 97%   BMI 32.00 kg/m   Patient alert and oriented and in no cardiopulmonary distress.  HEENT: No facial asymmetry, EOMI,     Neck supple .  Chest: Clear to auscultation bilaterally.  CVS: S1, S2 no murmurs, no S3.Regular rate.   Ext: No edema  MS: Adequate ROM spine, shoulders, hips and knees.Tender over distal PIP of left great toe  Skin: Intact, no ulcerations or rash noted.Left great toenail thickened and discolored  Psych: Good eye contact, normal affect. Memory intact not anxious or depressed appearing.    Assessment & Plan  Great toe pain, left 1 year h/o of intermittent pain and swelling of left grerat toe, nail is thickened and discolored, nailbed is red and tender, reports  intermittent " gout  flares " specifically on distal iP of left great toe  GERD (gastroesophageal reflux disease) Controlled, no change in medication

## 2022-08-06 ENCOUNTER — Ambulatory Visit: Payer: PPO | Admitting: Podiatry

## 2022-08-06 VITALS — BP 120/72

## 2022-08-06 DIAGNOSIS — B351 Tinea unguium: Secondary | ICD-10-CM | POA: Diagnosis not present

## 2022-08-06 DIAGNOSIS — L603 Nail dystrophy: Secondary | ICD-10-CM

## 2022-08-06 MED ORDER — CICLOPIROX 8 % EX SOLN: Freq: Every day | CUTANEOUS | 0 refills | Status: DC

## 2022-08-06 NOTE — Progress Notes (Signed)
Subjective:  Patient ID: Kim Grimes, female    DOB: [birth year],  MRN: [id number]  Chief Complaint  Patient presents with   Nail Problem    Discoloration of nail     72 y.o. female presents with the above complaint.  Patient presents with left hallux thickened elongated dystrophic mycotic toenails x 1.  She went to get it evaluated discuss treatment options for it.  She does not want to oral medication she is tried some over-the-counter medication she would like to try another topical medication.   Review of Systems: Negative except as noted in the HPI. Denies N/V/F/Ch.  Past Medical History:  Diagnosis Date   Anxiety disorder    Carotid artery stenosis    Chronic pelvic pain in female    Coronary artery disease    Endometriosis    GERD (gastroesophageal reflux disease)    Helicobacter pylori gastritis    Dr. Olevia Perches    Hiatal hernia 2003   on UGI    Hypothyroidism 2009   IBS (irritable bowel syndrome) 2003   Obesity (BMI 30-39.9) DEC 2011 145 LBS   Osteoporosis    PONV (postoperative nausea and vomiting)     Current Outpatient Medications:    ciclopirox (PENLAC) 8 % solution, Apply topically at bedtime. Apply over nail and surrounding skin. Apply daily over previous coat. After seven (7) days, may remove with alcohol and continue cycle., Disp: 6.6 mL, Rfl: 0   dexlansoprazole (DEXILANT) 60 MG capsule, Take 1 capsule (60 mg total) by mouth daily., Disp: 90 capsule, Rfl: 3   levothyroxine (SYNTHROID) 88 MCG tablet, Take 1 tablet (88 mcg total) by mouth daily., Disp: 30 tablet, Rfl: 5   montelukast (SINGULAIR) 10 MG tablet, TAKE 1 TABLET BY MOUTH EVERYDAY AT BEDTIME (Patient taking differently: Take 10 mg by mouth daily as needed (allergies).), Disp: 90 tablet, Rfl: 1  Current Facility-Administered Medications:    azelastine (ASTELIN) 0.1 % nasal spray 1 spray, 1 spray, Each Nare, BID, Rozetta Nunnery, MD  Social History   Tobacco Use  Smoking Status Never   Smokeless Tobacco Never  Tobacco Comments   Never smoked    Allergies  Allergen Reactions   Aspirin Other (See Comments)    "blood rushing"   Diazepam Other (See Comments)    Agitation   Prednisone Other (See Comments)    Any type of steroids. Pt states she has seen what they do to other people and she just doesn't take them   Objective:   Vitals:   08/06/22 1323  BP: 120/72   There is no height or weight on file to calculate BMI. Constitutional Well developed. Well nourished.  Vascular Dorsalis pedis pulses palpable bilaterally. Posterior tibial pulses palpable bilaterally. Capillary refill normal to all digits.  No cyanosis or clubbing noted. Pedal hair growth normal.  Neurologic Normal speech. Oriented to person, place, and time. Epicritic sensation to light touch grossly present bilaterally.  Dermatologic Nails thickened and negative dystrophic mycotic toenails x 1 Skin within normal limits  Orthopedic: Normal joint ROM without pain or crepitus bilaterally. No visible deformities. No bony tenderness.   Radiographs: None Assessment:   1. Nail dystrophy   2. Onychomycosis due to dermatophyte    Plan:  Patient was evaluated and treated and all questions answered.  Left hallux chronic mycosis/nail fungus -Educated the patient on the etiology of onychomycosis and various treatment options associated with improving the fungal load.  I explained to the patient that there is 3 treatment  options available to treat the onychomycosis including topical, p.o., laser treatment.  Patient elected to undergo topical medication at this time.  She does not want to take Lamisil for laser.  I discussed that she will need to apply it twice a day for 6 to 8 months.  She states understanding.   No follow-ups on file.

## 2022-08-13 ENCOUNTER — Other Ambulatory Visit (INDEPENDENT_AMBULATORY_CARE_PROVIDER_SITE_OTHER): Payer: PPO

## 2022-08-13 DIAGNOSIS — E039 Hypothyroidism, unspecified: Secondary | ICD-10-CM | POA: Diagnosis not present

## 2022-08-14 LAB — THYROGLOBULIN ANTIBODY: Thyroglobulin Ab: <1 IU/mL (ref <=1)

## 2022-08-14 LAB — TSH: TSH: 0.03 u[IU]/mL — ABNORMAL LOW (ref 0.35–5.50)

## 2022-08-14 LAB — THYROID PEROXIDASE ANTIBODY: Thyroperoxidase Ab SerPl-aCnc: 1 IU/mL (ref <9)

## 2022-08-14 LAB — T4, FREE: Free T4: 1.78 ng/dL — ABNORMAL HIGH (ref 0.60–1.60)

## 2022-08-14 MED ORDER — LEVOTHYROXINE SODIUM 88 MCG PO TABS: 88.0000 ug | ORAL_TABLET | Freq: Every day | ORAL | 3 refills | Status: DC

## 2022-08-18 ENCOUNTER — Other Ambulatory Visit: Payer: Self-pay

## 2022-08-18 DIAGNOSIS — E039 Hypothyroidism, unspecified: Secondary | ICD-10-CM

## 2022-08-18 MED ORDER — LEVOTHYROXINE SODIUM 75 MCG PO TABS: 75.0000 ug | ORAL_TABLET | Freq: Every day | ORAL | 1 refills | Status: DC

## 2022-08-18 NOTE — Telephone Encounter (Signed)
Pt called to request a rx for the 75 mcg dose of Levothyroxine advised in recent lab results. Rx sent to preferred pharmacy.

## 2022-08-19 NOTE — Progress Notes (Unsigned)
GI Office Note    Referring Provider: Fayrene Helper, MD Primary Care Physician:  Fayrene Helper, MD  Primary Gastroenterologist: Elon Alas. Abbey Chatters, DO   Chief Complaint   No chief complaint on file.   History of Present Illness   Kim Grimes is a 72 y.o. female presenting today for follow up. Last seen in 04/2022. H/o GERD, hiatal hernia, constipation.     Underwent surveillance colonoscopy due to history of polyps in the past on 12/24/2021, 1 tubular adenoma removed, recall 2028.   Last EGD 01/19/2012, small hiatal hernia, gastritis, biopsies negative for H. pylori, fundic gland polyp.    Medications   Current Outpatient Medications  Medication Sig Dispense Refill   levothyroxine (SYNTHROID) 75 MCG tablet Take 1 tablet (75 mcg total) by mouth daily. 45 tablet 1   ciclopirox (PENLAC) 8 % solution Apply topically at bedtime. Apply over nail and surrounding skin. Apply daily over previous coat. After seven (7) days, may remove with alcohol and continue cycle. 6.6 mL 0   dexlansoprazole (DEXILANT) 60 MG capsule Take 1 capsule (60 mg total) by mouth daily. 90 capsule 3   montelukast (SINGULAIR) 10 MG tablet TAKE 1 TABLET BY MOUTH EVERYDAY AT BEDTIME (Patient taking differently: Take 10 mg by mouth daily as needed (allergies).) 90 tablet 1   Current Facility-Administered Medications  Medication Dose Route Frequency Provider Last Rate Last Admin   azelastine (ASTELIN) 0.1 % nasal spray 1 spray  1 spray Each Nare BID Rozetta Nunnery, MD        Allergies   Allergies as of 08/20/2022 - Review Complete 08/06/2022  Allergen Reaction Noted   Aspirin Other (See Comments) 05/09/2014   Diazepam Other (See Comments)    Prednisone Other (See Comments) 02/21/2020     Past Medical History   Past Medical History:  Diagnosis Date   Anxiety disorder    Carotid artery stenosis    Chronic pelvic pain in female    Coronary artery disease    Endometriosis     GERD (gastroesophageal reflux disease)    Helicobacter pylori gastritis    Dr. Olevia Perches    Hiatal hernia 2003   on UGI    Hypothyroidism 2009   IBS (irritable bowel syndrome) 2003   Obesity (BMI 30-39.9) DEC 2011 145 LBS   Osteoporosis    PONV (postoperative nausea and vomiting)     Past Surgical History   Past Surgical History:  Procedure Laterality Date   ABDOMINAL HYSTERECTOMY     adhesiolysis     dense adhesion between rectum and sigmoid,  & pelvis    BIOPSY  05/20/2022   Procedure: BIOPSY;  Surgeon: Eloise Harman, DO;  [facility];  Service: Endoscopy;;   BRAVO [place] STUDY  01/19/2012   Procedure: BRAVO [place];  Surgeon: [name], MD;  [facility];  Service: Endoscopy;;   CATARACT EXTRACTION W/PHACO Left 06/03/2019   Procedure: CATARACT EXTRACTION PHACO AND INTRAOCULAR LENS PLACEMENT LEFT EYE (CDE: 2.92);  Surgeon: [name], MD;  Location: [facility];  Service: Ophthalmology;  Laterality: Left;   COLONOSCOPY  03: [facility]/D & 08: SCREENING   WNL'S   COLONOSCOPY  07/28/2011   sessile polyp in ascending colon/internal hemorrhoids. Next colonoscopy January 2018   COLONOSCOPY N/A 08/01/2016   Procedure: COLONOSCOPY;  Surgeon: [name], MD;  [facility]: [facility];  Service: Endoscopy;  Laterality: N/A;  930    COLONOSCOPY WITH PROPOFOL N/A  12/24/2021   Procedure: COLONOSCOPY WITH PROPOFOL;  Surgeon: Eloise Harman, DO;  [facility];  Service: Endoscopy;  Laterality: N/A;  9:00am   ESOPHAGOGASTRODUODENOSCOPY  01/19/2012   SLF: SMALL Hiatal hernia/Mild gastritis   ESOPHAGOGASTRODUODENOSCOPY (EGD) WITH PROPOFOL N/A 05/20/2022   Procedure: ESOPHAGOGASTRODUODENOSCOPY (EGD) WITH PROPOFOL;  Surgeon: Eloise Harman, DO;  [facility];  Service: Endoscopy;  Laterality: N/A;  12:45 pm, pt knows to arrive at 7:00   EYE SURGERY Right 10/18/2013   catarat extraction   LAPAROSCOPY     OLECRANON BURSECTOMY Left 07/01/2018    Procedure: LEFT ELBOW OLECRANON BURSECTOMY AND SPUR EXCISION;  Surgeon: [name], MD;  Location: [facility];  Service: Orthopedics;  Laterality: Left;   POLYPECTOMY  12/24/2021   Procedure: POLYPECTOMY INTESTINAL;  Surgeon: Eloise Harman, DO;  [facility];  Service: Endoscopy;;   rt foot sx Bilateral    s/p hysterectomy     SALPINGOOPHORECTOMY     UPPER GASTROINTESTINAL ENDOSCOPY  DEC 2010: DYSPEPSIA/DYSPHAGIA   [place] RING/ESO DIL 16 MM, GASTRITIS/DUODENITIS 2o to Aleve/on OMP PRN    Past Family History   Family History  Problem Relation Age of Onset   Prostate cancer Father    Colon cancer Father        < 52 YO   Cancer Father        prostate and colon   Pancreatic cancer Mother    Cancer Mother        pancreas   Diabetes Mother    Diabetes Brother    Cancer Brother        prostate    Diabetes Sister        prediabetic   Heart disease Brother        CHF   Thyroid disease Neg Hx     Past Social History   Social History   Socioeconomic History   Marital status: Divorced    Spouse name: Not on file   Number of children: Not on file   Years of education: Not on file   Highest education level: Not on file  Occupational History   Occupation: fulltime at [employer]   Tobacco Use   Smoking status: Never   Smokeless tobacco: Never   Tobacco comments:    Never smoked  Vaping Use   Vaping Use: Never used  Substance and Sexual Activity   Alcohol use: Not Currently    Comment: occasional, maybe once/year   Drug use: No   Sexual activity: Not Currently    Birth control/protection: None  Other Topics Concern   Not on file  Social History Narrative   Not on file   Social Determinants of Health   Financial Resource Strain: Low Risk  (03/25/2022)   Overall Financial Resource Strain (CARDIA)    Difficulty of Paying Living Expenses: Not hard at all  Food Insecurity: No Food Insecurity (03/25/2022)   Hunger Vital Sign    Worried About  Running Out of Food in the Last Year: Never true    Ran Out of Food in the Last Year: Never true  Transportation Needs: No Transportation Needs (03/25/2022)   PRAPARE - Hydrologist (Medical): No    Lack of Transportation (Non-Medical): No  Physical Activity: Insufficiently Active (03/25/2022)   Exercise Vital Sign    Days of Exercise per Week: 3 days    Minutes of Exercise per Session: 30 min  Stress: No Stress  Concern Present (03/25/2022)   [place]    Feeling of Stress : Not at all  Social Connections: Moderately Integrated (03/25/2022)   Social Connection and Isolation Panel [NHANES]    Frequency of Communication with Friends and Family: Three times a week    Frequency of Social Gatherings with Friends and Family: More than three times a week    Attends Religious Services: More than 4 times per year    Active Member of [employer] or Organizations: Yes    Attends Music therapist: More than 4 times per year    Marital Status: Divorced  Intimate Partner Violence: Not At Risk (03/25/2022)   Humiliation, Afraid, Rape, and Kick questionnaire    Fear of Current or Ex-Partner: No    Emotionally Abused: No    Physically Abused: No    Sexually Abused: No    Review of Systems   General: Negative for anorexia, weight loss, fever, chills, fatigue, weakness. ENT: Negative for hoarseness, difficulty swallowing , nasal congestion. CV: Negative for chest pain, angina, palpitations, dyspnea on exertion, peripheral edema.  Respiratory: Negative for dyspnea at rest, dyspnea on exertion, cough, sputum, wheezing.  GI: See history of present illness. GU:  Negative for dysuria, hematuria, urinary incontinence, urinary frequency, nocturnal urination.  Endo: Negative for unusual weight change.     Physical Exam   There were no vitals taken for this visit.   General: Well-nourished, well-developed  in no acute distress.  Eyes: No icterus. Mouth: Oropharyngeal mucosa moist and pink , no lesions erythema or exudate. Lungs: Clear to auscultation bilaterally.  Heart: Regular rate and rhythm, no murmurs rubs or gallops.  Abdomen: Bowel sounds are normal, nontender, nondistended, no hepatosplenomegaly or masses,  no abdominal bruits or hernia , no rebound or guarding.  Rectal: ***  Extremities: No lower extremity edema. No clubbing or deformities. Neuro: Alert and oriented x 4   Skin: Warm and dry, no jaundice.   Psych: Alert and cooperative, normal mood and affect.  Labs   *** Imaging Studies   No results found.  Assessment       PLAN   ***   Laureen Ochs. Bobby Rumpf, [place], [facility]

## 2022-08-20 ENCOUNTER — Telehealth: Payer: Self-pay | Admitting: Gastroenterology

## 2022-08-20 ENCOUNTER — Encounter: Payer: Self-pay | Admitting: Gastroenterology

## 2022-08-20 ENCOUNTER — Ambulatory Visit: Payer: PPO | Admitting: Gastroenterology

## 2022-08-20 VITALS — BP 120/82 | HR 79 | Temp 97.9°F | Ht <= 58 in | Wt 150.2 lb

## 2022-08-20 DIAGNOSIS — K219 Gastro-esophageal reflux disease without esophagitis: Secondary | ICD-10-CM | POA: Diagnosis not present

## 2022-08-20 NOTE — Patient Instructions (Addendum)
Continue Dexilant 75m daily before breakfast. Please let uKoreaknow if your next refill is expensive. I will let you know if Dr. CAbbey Chattersadvises for another upper endoscopy at time of your colonoscopy.  Return office visit as needed.   It was a pleasure to see you today. I want to create trusting relationships with patients and provide genuine, compassionate, and quality care. I truly value your feedback, so please be on the lookout for a survey regarding your visit with me today. I appreciate your time in completing this!

## 2022-08-20 NOTE — Telephone Encounter (Signed)
Please let pt know that I discussed with dr. Abbey Chatters regarding future EGD for history of fundic gland polyps. He says typically surveillance not needed but would be reasonable to consider egd at time of colonoscopy in 2028.   Manuela Schwartz, Please add "possible EGD" to the TCS 2028 NIC.

## 2022-08-20 NOTE — Progress Notes (Signed)
Discussed with Dr. Abbey Chatters, consider EGD at time of colonoscopy in 2028.

## 2022-08-21 NOTE — Telephone Encounter (Signed)
Pt was made aware and verbalized understanding.  

## 2022-08-22 ENCOUNTER — Telehealth: Payer: Self-pay | Admitting: Family Medicine

## 2022-08-22 NOTE — Telephone Encounter (Signed)
Patient came by the office needs bone density test setup needs appt after 3:00 pm.

## 2022-08-22 NOTE — Telephone Encounter (Signed)
Patient not due for bone density, patient advised

## 2022-08-28 ENCOUNTER — Encounter: Payer: Self-pay | Admitting: Radiology

## 2022-08-31 ENCOUNTER — Other Ambulatory Visit: Payer: Self-pay | Admitting: Internal Medicine

## 2022-08-31 DIAGNOSIS — E039 Hypothyroidism, unspecified: Secondary | ICD-10-CM

## 2022-09-15 ENCOUNTER — Other Ambulatory Visit (INDEPENDENT_AMBULATORY_CARE_PROVIDER_SITE_OTHER): Payer: PPO

## 2022-09-15 DIAGNOSIS — E039 Hypothyroidism, unspecified: Secondary | ICD-10-CM

## 2022-09-16 ENCOUNTER — Other Ambulatory Visit: Payer: Self-pay

## 2022-09-16 DIAGNOSIS — E039 Hypothyroidism, unspecified: Secondary | ICD-10-CM

## 2022-09-16 LAB — T4, FREE: Free T4: 1.65 ng/dL — ABNORMAL HIGH (ref 0.60–1.60)

## 2022-09-16 LAB — TSH: TSH: 0.03 u[IU]/mL — ABNORMAL LOW (ref 0.35–5.50)

## 2022-09-16 MED ORDER — LEVOTHYROXINE SODIUM 50 MCG PO TABS: 50.0000 ug | ORAL_TABLET | Freq: Every day | ORAL | 0 refills | Status: DC

## 2022-09-16 NOTE — Telephone Encounter (Signed)
Labs ordered.

## 2022-09-17 ENCOUNTER — Telehealth: Payer: Self-pay

## 2022-09-17 MED ORDER — RABEPRAZOLE SODIUM 20 MG PO TBEC: 20.0000 mg | DELAYED_RELEASE_TABLET | Freq: Every day | ORAL | 3 refills | Status: DC

## 2022-09-17 NOTE — Telephone Encounter (Signed)
Pt states that she has tried that, omeprazole and pantoprazole and none of those work.

## 2022-09-17 NOTE — Telephone Encounter (Signed)
RX for aciphex 20mg  daily sent.

## 2022-09-17 NOTE — Telephone Encounter (Signed)
Pt [name] wanting Vicente Males to call her but she seen Magda Paganini last.

## 2022-09-17 NOTE — Addendum Note (Signed)
Addended by: Mahala Menghini on: 09/17/2022 01:23 PM   Modules accepted: Orders

## 2022-09-17 NOTE — Telephone Encounter (Signed)
Pt is needing a cheaper ppi called in. Pt states that the dexilant is 200 a month.

## 2022-09-19 MED ORDER — LANSOPRAZOLE 30 MG PO CPDR: 30.0000 mg | DELAYED_RELEASE_CAPSULE | Freq: Every day | ORAL | 3 refills | Status: DC

## 2022-09-19 NOTE — Addendum Note (Signed)
Addended by: Mahala Menghini on: 09/19/2022 10:35 AM   Modules accepted: Orders

## 2022-09-19 NOTE — Telephone Encounter (Signed)
Pt was made aware and verbalized understanding.  

## 2022-09-19 NOTE — Telephone Encounter (Signed)
Lets try lansoprazole then.

## 2022-10-25 ENCOUNTER — Other Ambulatory Visit: Payer: Self-pay | Admitting: Internal Medicine

## 2022-10-25 DIAGNOSIS — E039 Hypothyroidism, unspecified: Secondary | ICD-10-CM

## 2022-10-27 DIAGNOSIS — H02831 Dermatochalasis of right upper eyelid: Secondary | ICD-10-CM | POA: Diagnosis not present

## 2022-10-27 DIAGNOSIS — H401131 Primary open-angle glaucoma, bilateral, mild stage: Secondary | ICD-10-CM | POA: Diagnosis not present

## 2022-10-27 DIAGNOSIS — H01001 Unspecified blepharitis right upper eyelid: Secondary | ICD-10-CM | POA: Diagnosis not present

## 2022-10-27 DIAGNOSIS — H02834 Dermatochalasis of left upper eyelid: Secondary | ICD-10-CM | POA: Diagnosis not present

## 2022-10-29 ENCOUNTER — Other Ambulatory Visit (INDEPENDENT_AMBULATORY_CARE_PROVIDER_SITE_OTHER): Payer: PPO

## 2022-10-29 DIAGNOSIS — E039 Hypothyroidism, unspecified: Secondary | ICD-10-CM | POA: Diagnosis not present

## 2022-10-30 ENCOUNTER — Other Ambulatory Visit: Payer: Self-pay

## 2022-10-30 ENCOUNTER — Telehealth: Payer: Self-pay | Admitting: Internal Medicine

## 2022-10-30 DIAGNOSIS — E039 Hypothyroidism, unspecified: Secondary | ICD-10-CM

## 2022-10-30 LAB — TSH: TSH: 2.39 u[IU]/mL (ref 0.35–5.50)

## 2022-10-30 LAB — T4, FREE: Free T4: 1.15 ng/dL (ref 0.60–1.60)

## 2022-10-30 MED ORDER — LEVOTHYROXINE SODIUM 50 MCG PO TABS: 50.0000 ug | ORAL_TABLET | ORAL | 1 refills | Status: DC

## 2022-10-30 MED ORDER — LEVOTHYROXINE SODIUM 75 MCG PO TABS: 75.0000 ug | ORAL_TABLET | ORAL | 1 refills | Status: DC

## 2022-10-30 NOTE — Telephone Encounter (Signed)
called patient to let her know her current thyroid levels are back within normal range as well as to advise her to alternate the Levothyroxine 50 with Levothyroxine 75 mcg every other day to see if it helps with the hair loss. I advised her you would be calling her Rx in the [facility] for the . Patient expressed understand of alternating the medications every other day. Peyton Bottoms

## 2022-12-08 ENCOUNTER — Ambulatory Visit (INDEPENDENT_AMBULATORY_CARE_PROVIDER_SITE_OTHER): Payer: PPO | Admitting: Gastroenterology

## 2022-12-08 ENCOUNTER — Encounter: Payer: Self-pay | Admitting: *Deleted

## 2022-12-08 ENCOUNTER — Telehealth: Payer: Self-pay | Admitting: Internal Medicine

## 2022-12-08 ENCOUNTER — Encounter: Payer: Self-pay | Admitting: Gastroenterology

## 2022-12-08 ENCOUNTER — Ambulatory Visit (HOSPITAL_COMMUNITY)
Admission: RE | Admit: 2022-12-08 | Discharge: 2022-12-08 | Disposition: A | Discharge status: Home / Self Care | Payer: PPO | Source: Ambulatory Visit | Attending: Gastroenterology | Admitting: Gastroenterology

## 2022-12-08 ENCOUNTER — Telehealth: Payer: Self-pay | Admitting: *Deleted

## 2022-12-08 VITALS — BP 123/82 | HR 77 | Temp 97.7°F | Ht <= 58 in | Wt 152.0 lb

## 2022-12-08 DIAGNOSIS — K5904 Chronic idiopathic constipation: Secondary | ICD-10-CM

## 2022-12-08 DIAGNOSIS — R1031 Right lower quadrant pain: Secondary | ICD-10-CM

## 2022-12-08 DIAGNOSIS — K219 Gastro-esophageal reflux disease without esophagitis: Secondary | ICD-10-CM | POA: Diagnosis not present

## 2022-12-08 DIAGNOSIS — K5900 Constipation, unspecified: Secondary | ICD-10-CM | POA: Diagnosis not present

## 2022-12-08 NOTE — Patient Instructions (Addendum)
I am providing with some samples of Voquezna today.  Take this for at least the next 10 days and let me know if you see some improvement in your reflux.  If not please let me know if you would like to resume Dexilant and I can send in that prescription.   Follow a GERD diet:  Avoid fried, fatty, greasy, spicy, citrus foods. Avoid caffeine and carbonated beverages. Avoid chocolate. Try eating 4-6 small meals a day rather than 3 large meals. Do not eat within 3 hours of laying down. Prop head of bed up on wood or bricks to create a 6 inch incline.  You may go at any point to [facility] to perform the abdominal x-ray.  We will work on getting you scheduled for the CT of your abdomen and pelvis.  If you change her mind regarding something for abdominal pain/cramping then please let me know.  We will be in touch with any further recommendations after receiving results of your imaging.  I would try to take it is easy for now and you may alternate ice and heat to the area just in case this is a musculoskeletal injury causing your pain.  Will plan to follow-up in the office in about 6 weeks.   It was a pleasure to see you today. I want to create trusting relationships with patients. If you receive a survey regarding your visit,  I greatly appreciate you taking time to fill this out on paper or through your MyChart. I value your feedback.  Brooke Bonito, MSN, FNP-BC, AGACNP-BC [facility]

## 2022-12-08 NOTE — Telephone Encounter (Signed)
[facility] [place]  CT is scheduled at [facility] on 12/16/22, arrive at 1:15 pm to check in, NPO 4 hours prior.

## 2022-12-08 NOTE — Telephone Encounter (Signed)
Patient called the office and would like to do labs before her visit on January 05, 2023 with Dr. Elvera Lennox - call back number is [phone]

## 2022-12-08 NOTE — Progress Notes (Signed)
GI Office Note    Referring Provider: Kerri Perches, MD Primary Care Physician:  Kerri Perches, MD Primary Gastroenterologist: Hennie Duos. Marletta Lor, DO   Date:  12/08/2022  ID:  Kim Grimes, DOB [birth year], MRN [id number]   Chief Complaint   Chief Complaint  Patient presents with   Abdominal Pain    pt states that she has pain in lower abdomen, hurts when she walks but not when she presses on stomach, has been happening for the last few weeks   History of Present Illness  Kim Grimes is a 72 y.o. female with a history of GERD, H. pylori gastritis, constipation, hypothyroidism CAD and anxiety and hiatal hernia presenting today for evaluation of left lower quadrant abdominal pain.   EGD 01/19/2012: -Small hernia -Gastritis -Fundic gland polyp and biopsies negative for H. pylori  Colonoscopy 07/2016: -2 polyps at the hepatic flexure and mid ascending colon -Internal hemorrhoids -Redundant left colon -Repeat colonoscopy in 5 years  EGD 04/2022: -Small hiatal hernia -Scattered mild inflammation characterized by erythema in the gastric body -Multiple 3-8 mm sessile polyps with no bleeding -Pathology revealed benign fundic gland polyps, gastric biopsy negative for H. Pylori  Colonoscopy 12/24/2021 for surveillance: -1 tubular adenoma removed -Due for repeat in 2028  Last office visit 08/20/2022.  Feeling well.  Works full-time at [employer]  Exercises regularly.  Dexilant expensive but seems to be helping hemoglobins and regurgitation issues she was having previously.  No dysphagia.  Denied constipation, melena, or BRBPR.  Concerned about upper abdominal distention that occurs with sitting up.  Advised to continue Dexilant and return as needed.  Today: Patient reports pain in the lower abdomen with walking but not with palpation.  Symptoms have been ongoing for about 2 weeks.  Pain in the right lower quadrant. States she plays pickle ball and pushes carts at  work. Pain has been gradually coming but pain worsened on Thursday last week and went away and then started back today and now is hurting when she walks. Laid down a few weeks ago and felt better and then when she started walking again she had pain. Not taking anything for pain.  Pain does not appear to worsen after meals.  Has vacation upcoming.   GERD - was controlled on Dexilant but lansoprazole not really helping. Feels bloated all the time. $200 with insurance. Has tried Nexium and omeprazole. Unsure if she has tried Aciphex.   BM comes and goes. Had a little diarrhea this morning due to eating ice cream yesterday.   Has history of hemorrhoids and did have some burning a few weeks ago. No brbpr.    Current Outpatient Medications  Medication Sig Dispense Refill   azelastine (ASTELIN) 0.1 % nasal spray Place 1 spray into both nostrils 2 (two) times daily as needed for rhinitis. Use in each nostril as directed     lansoprazole (PREVACID) 30 MG capsule Take 1 capsule (30 mg total) by mouth daily before breakfast. 90 capsule 3   levothyroxine (SYNTHROID) 50 MCG tablet Take 1 tablet (50 mcg total) by mouth every other day. 45 tablet 1   levothyroxine (SYNTHROID) 75 MCG tablet Take 1 tablet (75 mcg total) by mouth every other day. 45 tablet 1   montelukast (SINGULAIR) 10 MG tablet TAKE 1 TABLET BY MOUTH EVERYDAY AT BEDTIME (Patient taking differently: Take 10 mg by mouth daily as needed (allergies).) 90 tablet 1   No current facility-administered medications for this visit.    Past  Medical History:  Diagnosis Date   Anxiety disorder    Carotid artery stenosis    Chronic pelvic pain in female    Coronary artery disease    Endometriosis    GERD (gastroesophageal reflux disease)    Helicobacter pylori gastritis    Dr. Juanda Chance    Hiatal hernia 2003   on UGI    Hypothyroidism 2009   IBS (irritable bowel syndrome) 2003   Obesity (BMI 30-39.9) DEC 2011 145 LBS   Osteoporosis    PONV  (postoperative nausea and vomiting)     Past Surgical History:  Procedure Laterality Date   ABDOMINAL HYSTERECTOMY     adhesiolysis     dense adhesion between rectum and sigmoid,  & pelvis    BIOPSY  05/20/2022   Procedure: BIOPSY;  Surgeon: Lanelle Bal, DO;  [facility];  Service: Endoscopy;;   BRAVO PH STUDY  01/19/2012   Procedure: BRAVO PH STUDY;  Surgeon: West Bali, MD;  [facility];  Service: Endoscopy;;   CATARACT EXTRACTION W/PHACO Left 06/03/2019   Procedure: CATARACT EXTRACTION PHACO AND INTRAOCULAR LENS PLACEMENT LEFT EYE (CDE: 2.92);  Surgeon: Fabio Pierce, MD;  Location: [facility];  Service: Ophthalmology;  Laterality: Left;   COLONOSCOPY  03: [facility]/D & 08: SCREENING   WNL'S   COLONOSCOPY  07/28/2011   sessile polyp in ascending colon/internal hemorrhoids. Next colonoscopy January 2018   COLONOSCOPY N/A 08/01/2016   Procedure: COLONOSCOPY;  Surgeon: West Bali, MD;  [facility];  Service: Endoscopy;  Laterality: N/A;  930    COLONOSCOPY WITH PROPOFOL N/A 12/24/2021   Procedure: COLONOSCOPY WITH PROPOFOL;  Surgeon: Lanelle Bal, DO;  [facility];  Service: Endoscopy;  Laterality: N/A;  9:00am   ESOPHAGOGASTRODUODENOSCOPY  01/19/2012   SLF: SMALL Hiatal hernia/Mild gastritis   ESOPHAGOGASTRODUODENOSCOPY (EGD) WITH PROPOFOL N/A 05/20/2022   Procedure: ESOPHAGOGASTRODUODENOSCOPY (EGD) WITH PROPOFOL;  Surgeon: Lanelle Bal, DO;  [facility];  Service: Endoscopy;  Laterality: N/A;  12:45 pm, pt knows to arrive at 7:00   EYE SURGERY Right 10/18/2013   catarat extraction   LAPAROSCOPY     OLECRANON BURSECTOMY Left 07/01/2018   Procedure: LEFT ELBOW OLECRANON BURSECTOMY AND SPUR EXCISION;  Surgeon: Vickki Hearing, MD;  Location: [facility];  Service: Orthopedics;  Laterality: Left;   POLYPECTOMY  12/24/2021   Procedure: POLYPECTOMY INTESTINAL;  Surgeon: Lanelle Bal, DO;  [facility];   Service: Endoscopy;;   rt foot sx Bilateral    s/p hysterectomy     SALPINGOOPHORECTOMY     UPPER GASTROINTESTINAL ENDOSCOPY  DEC 2010: DYSPEPSIA/DYSPHAGIA   SCH RING/ESO DIL 16 MM, GASTRITIS/DUODENITIS 2o to Aleve/on OMP PRN    Family History  Problem Relation Age of Onset   Prostate cancer Father    Colon cancer Father        < 77 YO   Cancer Father        prostate and colon   Pancreatic cancer Mother    Cancer Mother        pancreas   Diabetes Mother    Diabetes Brother    Cancer Brother        prostate    Diabetes Sister        prediabetic   Heart disease Brother        CHF   Thyroid disease Neg Hx     Allergies as of 12/08/2022 - Review Complete 12/08/2022  Allergen Reaction Noted   Aspirin Other (See Comments) 05/09/2014   Diazepam Other (See Comments)    Prednisone Other (See Comments) 02/21/2020    Social History   Socioeconomic History   Marital status: Divorced    Spouse name: Not on file   Number of children: Not on file   Years of education: Not on file   Highest education level: Not on file  Occupational History   Occupation: fulltime at [employer]   Tobacco Use   Smoking status: Never   Smokeless tobacco: Never   Tobacco comments:    Never smoked  Vaping Use   Vaping Use: Never used  Substance and Sexual Activity   Alcohol use: Not Currently    Comment: occasional, maybe once/year   Drug use: No   Sexual activity: Not Currently    Birth control/protection: None  Other Topics Concern   Not on file  Social History Narrative   Not on file   Social Determinants of Health   Financial Resource Strain: Low Risk  (03/25/2022)   Overall Financial Resource Strain (CARDIA)    Difficulty of Paying Living Expenses: Not hard at all  Food Insecurity: No Food Insecurity (03/25/2022)   Hunger Vital Sign    Worried About Running Out of Food in the Last Year: Never true    Ran Out of Food in the Last Year: Never true  Transportation  Needs: No Transportation Needs (03/25/2022)   PRAPARE - Administrator, Civil Service (Medical): No    Lack of Transportation (Non-Medical): No  Physical Activity: Insufficiently Active (03/25/2022)   Exercise Vital Sign    Days of Exercise per Week: 3 days    Minutes of Exercise per Session: 30 min  Stress: No Stress Concern Present (03/25/2022)   [employer] of Occupational Health - Occupational Stress Questionnaire    Feeling of Stress : Not at all  Social Connections: Moderately Integrated (03/25/2022)   Social Connection and Isolation Panel [NHANES]    Frequency of Communication with Friends and Family: Three times a week    Frequency of Social Gatherings with Friends and Family: More than three times a week    Attends Religious Services: More than 4 times per year    Active Member of [employer] or Organizations: Yes    Attends Engineer, structural: More than 4 times per year    Marital Status: Divorced     Review of Systems   Gen: Denies fever, chills, anorexia. Denies fatigue, weakness, weight loss.  CV: Denies chest pain, palpitations, syncope, peripheral edema, and claudication. Resp: Denies dyspnea at rest, cough, wheezing, coughing up blood, and pleurisy. GI: See HPI Derm: Denies rash, itching, dry skin Psych: Denies depression, anxiety, memory loss, confusion. No homicidal or suicidal ideation.  Heme: Denies bruising, bleeding, and enlarged lymph nodes.   Physical Exam   BP 123/82 (BP Location: Right Arm, Patient Position: Sitting, Cuff Size: Normal)   Pulse 77   Temp 97.7 F (36.5 C) (Temporal)   Ht 4\' 10"  (1.473 m)   Wt 152 lb (68.9 kg)   SpO2 96%   BMI 31.77 kg/m   General:   Alert and oriented. No distress noted. Pleasant and cooperative.  Head:  Normocephalic and atraumatic. Eyes:  Conjuctiva clear without scleral icterus. Mouth:  Oral mucosa pink and moist. Good dentition. No lesions. Abdomen:  +BS, soft, non-distended. Mild ttp to  epigastrium. No rebound or guarding. No HSM or masses noted. Rectal: deferred Msk:  Symmetrical without gross deformities. Normal posture. Extremities:  Without edema. Neurologic:  Alert and  oriented x4 Psych:  Alert and cooperative. Normal mood and affect.   Assessment  Kim Grimes is a 72 y.o. female with a history of GERD, H. pylori gastritis, constipation, hypothyroidism CAD and anxiety and hiatal hernia presenting today for evaluation of left lower quadrant abdominal pain.  GERD, small hiatal hernia: Not well-controlled on lansoprazole.  Previously well-controlled with Dexilant as this did not give her any bloating.  Will trial Voquezna and if ineffective will need to consider resuming Dexilant despite cost.  Mild TTP on exam today.  RLQ pain: Etiology unclear at this time.  Could be secondary to MSK etiology of constipation.  No tenderness on exam today.  Does have pain with walking which includes appendix or right-sided diverticulitis in the differential.  Will assess with a KUB to see if this could be a result of constipation.  No evidence of hernia on exam today.  Will obtain CT abdomen pelvis to assess for possible diverticulitis versus other inflammatory response including the appendix however symptoms have been waxing and waning.  Offered short course of dicyclomine however patient declined.  She would like to obtain imaging first.  Constipation: Does have some intermittent constipation but overall well-controlled.  Did have some diarrhea yesterday but secondary to lactose.  PLAN   Trial of Voquezna for GERD, if not effective consider resuming Dexilant KUB CT A/P Could consider short course of dicyclomine if needed.  Alternate ice/heat to the area for symptom improvement. Follow up with LSL or Dr. Marletta Lor in 6 weeks.     Brooke Bonito, MSN, FNP-BC, AGACNP-BC [facility]

## 2022-12-09 NOTE — Telephone Encounter (Signed)
Pt says she went yesterday for xray at [facility] and was told about her CT appointment on 12/16/22

## 2022-12-16 ENCOUNTER — Ambulatory Visit (HOSPITAL_COMMUNITY)
Admission: RE | Admit: 2022-12-16 | Discharge: 2022-12-16 | Disposition: A | Discharge status: Home / Self Care | Payer: PPO | Source: Ambulatory Visit | Attending: Gastroenterology | Admitting: Gastroenterology

## 2022-12-16 DIAGNOSIS — K760 Fatty (change of) liver, not elsewhere classified: Secondary | ICD-10-CM | POA: Diagnosis not present

## 2022-12-16 DIAGNOSIS — R1031 Right lower quadrant pain: Secondary | ICD-10-CM | POA: Diagnosis not present

## 2022-12-16 DIAGNOSIS — R109 Unspecified abdominal pain: Secondary | ICD-10-CM | POA: Diagnosis not present

## 2022-12-16 MED ORDER — IOHEXOL 300 MG/ML  SOLN
80.0000 mL | Freq: Once | INTRAMUSCULAR | Status: AC | PRN
  Administered 2022-12-16: 80 mL via INTRAVENOUS

## 2022-12-19 ENCOUNTER — Telehealth: Payer: Self-pay

## 2022-12-19 DIAGNOSIS — E039 Hypothyroidism, unspecified: Secondary | ICD-10-CM

## 2022-12-19 NOTE — Telephone Encounter (Signed)
I usually prefer to check labs at the time of the visit, but if the patient absolutely wants to do them before, can you please order a TSH and free T4 for her?

## 2022-12-19 NOTE — Telephone Encounter (Signed)
Pt called on 12/08/22 requesting a lab appt and she was placed on the schedule. Orders placed.

## 2022-12-19 NOTE — Telephone Encounter (Signed)
Pt is scheduled for labs next week and needs orders

## 2022-12-23 ENCOUNTER — Other Ambulatory Visit (INDEPENDENT_AMBULATORY_CARE_PROVIDER_SITE_OTHER): Payer: PPO

## 2022-12-23 DIAGNOSIS — E039 Hypothyroidism, unspecified: Secondary | ICD-10-CM

## 2022-12-23 LAB — T4, FREE: Free T4: 1.16 ng/dL (ref 0.60–1.60)

## 2022-12-23 LAB — TSH: TSH: 1.67 u[IU]/mL (ref 0.35–5.50)

## 2022-12-25 ENCOUNTER — Ambulatory Visit: Payer: PPO | Admitting: Family Medicine

## 2022-12-30 ENCOUNTER — Other Ambulatory Visit: Payer: PPO

## 2023-01-05 ENCOUNTER — Ambulatory Visit: Payer: PPO | Admitting: Internal Medicine

## 2023-01-05 ENCOUNTER — Encounter: Payer: Self-pay | Admitting: Internal Medicine

## 2023-01-05 VITALS — BP 122/80 | HR 86 | Ht <= 58 in | Wt 154.2 lb

## 2023-01-05 DIAGNOSIS — E039 Hypothyroidism, unspecified: Secondary | ICD-10-CM

## 2023-01-05 NOTE — Patient Instructions (Addendum)
Please continue levothyroxine 50 alternating with 75 mcg every other day.  Please come back for labs in 1.5 months  Take the thyroid hormone every day, with water, at least 30 minutes before breakfast, separated by at least 4 hours from: - acid reflux medications - calcium - iron - multivitamins  You should have an endocrinology follow-up appointment in 6 months.

## 2023-01-05 NOTE — Progress Notes (Signed)
Patient ID: Kim Grimes, female   DOB: 12-Oct-1950, 72 y.o.   MRN: [id number]  HPI  Kim Grimes is a 72 y.o.-year-old female, returning for follow-up for hypothyroidism.  She previously saw Dr. Everardo All, but last visit with me 6 months ago. She saw DR. Nida before.  Interim history: Hair loss improved after increasing the LT4 dose.  She remembers having brittle hair after starting LT4 100 mcg daily in 2011. She has weight gain, heat intolerance (for years), depression. No constipation.  Reviewed and addended hx: Pt. has been dx with hypothyroidism in 2011 by Dr. Cherly Hensen 2/2 fatigue >> on generic levothyroxine.  In 07/2022, she was on LT4 88 mcg daily, and we decreased the dose to 75 mcg daily. In 08/2022, she was on LT4 75 mcg daily, and we decreased the dose to 50 mcg daily. In 10/2022, she was on LT4 50 mcg daily, and we increased the dose to 50 alternating with 75 mcg every other day  She takes the levothyroxine: - forgetting it anymore >> if she does, she takes it later! - moved LT4 from the kitchen counter to the nightstand - in am (5 am) -when walking out the door to go to work - coffee + creamer 3h later - fasting - at least 3h from b'fast - no calcium - no iron - no multivitamins - + PPIs (Prevacid changed from Dexilant) at 8 am - not on Biotin  I reviewed pt's thyroid tests: Lab Results  Component Value Date   TSH 1.67 12/23/2022   TSH 2.39 10/29/2022   TSH 0.03 (L) 09/15/2022   TSH 0.03 (L) 08/13/2022   TSH 1.20 11/04/2021   TSH 3.70 05/03/2021   TSH 1.95 07/12/2020   TSH 4.01 04/10/2020   TSH 0.114 (L) 11/04/2019   TSH 0.504 07/04/2019   FREET4 1.16 12/23/2022   FREET4 1.15 10/29/2022   FREET4 1.65 (H) 09/15/2022   FREET4 1.78 (H) 08/13/2022   FREET4 1.25 11/04/2021   FREET4 1.2 05/03/2021   FREET4 1.30 07/12/2020   FREET4 0.91 04/10/2020   FREET4 1.84 (H) 11/04/2019   FREET4 1.95 (H) 07/04/2019   Antithyroid antibodies: Lab Results   Component Value Date   THGAB <1 08/13/2022   No components found for: "TPOAB"  Pt describes: - + weight gain despite exercise and not overeating - + heat intolerance  No: - fatigue - cold intolerance - depression - constipation - dry skin - hair loss  Pt denies: - feeling nodules in neck - dysphagia - choking Occasional hoarseness - sinus drainage. Occasionally, she is coughing in her sleep.  She has no FH of thyroid disorders. No FH of thyroid cancer.  No h/o radiation tx to head or neck. No recent use of iodine supplements. No recent steroid use. No use of herbal supplements.  Pt. also has a history of CAD and carotid artery stenosis, hyperlipidemia, GERD, hiatal hernia, IBS, vitamin D deficiency, osteoporosis.  ROS: + see HPI  Past Medical History:  Diagnosis Date   Anxiety disorder    Carotid artery stenosis    Chronic pelvic pain in female    Coronary artery disease    Endometriosis    GERD (gastroesophageal reflux disease)    Helicobacter pylori gastritis    Dr. Juanda Chance    Hiatal hernia 2003   on UGI    Hypothyroidism 2009   IBS (irritable bowel syndrome) 2003   Obesity (BMI 30-39.9) DEC 2011 145 LBS   Osteoporosis    PONV (  postoperative nausea and vomiting)    Past Surgical History:  Procedure Laterality Date   ABDOMINAL HYSTERECTOMY     adhesiolysis     dense adhesion between rectum and sigmoid,  & pelvis    BIOPSY  05/20/2022   Procedure: BIOPSY;  Surgeon: Lanelle Bal, DO;  [facility];  Service: Endoscopy;;   BRAVO PH STUDY  01/19/2012   Procedure: BRAVO PH STUDY;  Surgeon: West Bali, MD;  [facility];  Service: Endoscopy;;   CATARACT EXTRACTION W/PHACO Left 06/03/2019   Procedure: CATARACT EXTRACTION PHACO AND INTRAOCULAR LENS PLACEMENT LEFT EYE (CDE: 2.92);  Surgeon: Fabio Pierce, MD;  Location: [facility];  Service: Ophthalmology;  Laterality: Left;   COLONOSCOPY  03: [facility]/D & 08: SCREENING   WNL'S    COLONOSCOPY  07/28/2011   sessile polyp in ascending colon/internal hemorrhoids. Next colonoscopy January 2018   COLONOSCOPY N/A 08/01/2016   Procedure: COLONOSCOPY;  Surgeon: West Bali, MD;  [facility];  Service: Endoscopy;  Laterality: N/A;  930    COLONOSCOPY WITH PROPOFOL N/A 12/24/2021   Procedure: COLONOSCOPY WITH PROPOFOL;  Surgeon: Lanelle Bal, DO;  [facility];  Service: Endoscopy;  Laterality: N/A;  9:00am   ESOPHAGOGASTRODUODENOSCOPY  01/19/2012   SLF: SMALL Hiatal hernia/Mild gastritis   ESOPHAGOGASTRODUODENOSCOPY (EGD) WITH PROPOFOL N/A 05/20/2022   Procedure: ESOPHAGOGASTRODUODENOSCOPY (EGD) WITH PROPOFOL;  Surgeon: Lanelle Bal, DO;  [facility];  Service: Endoscopy;  Laterality: N/A;  12:45 pm, pt knows to arrive at 7:00   EYE SURGERY Right 10/18/2013   catarat extraction   LAPAROSCOPY     OLECRANON BURSECTOMY Left 07/01/2018   Procedure: LEFT ELBOW OLECRANON BURSECTOMY AND SPUR EXCISION;  Surgeon: Vickki Hearing, MD;  Location: [facility];  Service: Orthopedics;  Laterality: Left;   POLYPECTOMY  12/24/2021   Procedure: POLYPECTOMY INTESTINAL;  Surgeon: Lanelle Bal, DO;  [facility];  Service: Endoscopy;;   rt foot sx Bilateral    s/p hysterectomy     SALPINGOOPHORECTOMY     UPPER GASTROINTESTINAL ENDOSCOPY  DEC 2010: DYSPEPSIA/DYSPHAGIA   SCH RING/ESO DIL 16 MM, GASTRITIS/DUODENITIS 2o to Aleve/on OMP PRN   Social History   Socioeconomic History   Marital status: Divorced    Spouse name: Not on file   Number of children: Not on file   Years of education: Not on file   Highest education level: Not on file  Occupational History   Occupation: fulltime at [employer]   Tobacco Use   Smoking status: Never   Smokeless tobacco: Never   Tobacco comments:    Never smoked  Vaping Use   Vaping Use: Never used  Substance and Sexual Activity   Alcohol use: Not Currently    Comment: occasional,  maybe once/year   Drug use: No   Sexual activity: Not Currently    Birth control/protection: None  Other Topics Concern   Not on file  Social History Narrative   Not on file   Social Determinants of Health   Financial Resource Strain: Low Risk  (03/25/2022)   Overall Financial Resource Strain (CARDIA)    Difficulty of Paying Living Expenses: Not hard at all  Food Insecurity: No Food Insecurity (03/25/2022)   Hunger Vital Sign    Worried About Running Out of Food in the Last Year: Never true    Ran Out of Food in the Last Year: Never true  Transportation Needs: No Transportation Needs (  03/25/2022)   PRAPARE - Administrator, Civil Service (Medical): No    Lack of Transportation (Non-Medical): No  Physical Activity: Insufficiently Active (03/25/2022)   Exercise Vital Sign    Days of Exercise per Week: 3 days    Minutes of Exercise per Session: 30 min  Stress: No Stress Concern Present (03/25/2022)   [employer] of Occupational Health - Occupational Stress Questionnaire    Feeling of Stress : Not at all  Social Connections: Moderately Integrated (03/25/2022)   Social Connection and Isolation Panel [NHANES]    Frequency of Communication with Friends and Family: Three times a week    Frequency of Social Gatherings with Friends and Family: More than three times a week    Attends Religious Services: More than 4 times per year    Active Member of [employer] or Organizations: Yes    Attends Engineer, structural: More than 4 times per year    Marital Status: Divorced  Intimate Partner Violence: Not At Risk (03/25/2022)   Humiliation, Afraid, Rape, and Kick questionnaire    Fear of Current or Ex-Partner: No    Emotionally Abused: No    Physically Abused: No    Sexually Abused: No   Current Outpatient Medications on File Prior to Visit  Medication Sig Dispense Refill   azelastine (ASTELIN) 0.1 % nasal spray Place 1 spray into both nostrils 2 (two) times daily as needed  for rhinitis. Use in each nostril as directed     lansoprazole (PREVACID) 30 MG capsule Take 1 capsule (30 mg total) by mouth daily before breakfast. 90 capsule 3   levothyroxine (SYNTHROID) 50 MCG tablet Take 1 tablet (50 mcg total) by mouth every other day. 45 tablet 1   levothyroxine (SYNTHROID) 75 MCG tablet Take 1 tablet (75 mcg total) by mouth every other day. 45 tablet 1   montelukast (SINGULAIR) 10 MG tablet TAKE 1 TABLET BY MOUTH EVERYDAY AT BEDTIME (Patient taking differently: Take 10 mg by mouth daily as needed (allergies).) 90 tablet 1   No current facility-administered medications on file prior to visit.   Allergies  Allergen Reactions   Aspirin Other (See Comments)    "blood rushing"   Diazepam Other (See Comments)    Agitation   Prednisone Other (See Comments)    Any type of steroids. Pt states she has seen what they do to other people and she just doesn't take them   Family History  Problem Relation Age of Onset   Prostate cancer Father    Colon cancer Father        < 28 YO   Cancer Father        prostate and colon   Pancreatic cancer Mother    Cancer Mother        pancreas   Diabetes Mother    Diabetes Brother    Cancer Brother        prostate    Diabetes Sister        prediabetic   Heart disease Brother        CHF   Thyroid disease Neg Hx    PE: BP 122/80   Pulse 86   Ht 4\' 10"  (1.473 m)   Wt 154 lb 3.2 oz (69.9 kg)   SpO2 97%   BMI 32.23 kg/m  Wt Readings from Last 3 Encounters:  01/05/23 154 lb 3.2 oz (69.9 kg)  12/08/22 152 lb (68.9 kg)  08/20/22 150 lb 3.2 oz (68.1 kg)  Constitutional:  normal weight, in NAD Eyes:  EOMI, no exophthalmos ENT: no neck masses, no cervical lymphadenopathy Cardiovascular: RRR, No MRG Respiratory: CTA B Musculoskeletal: no deformities Skin:no rashes Neurological: no tremor with outstretched hands  ASSESSMENT: 1. Hypothyroidism  2.  Hair loss  PLAN:  1. Patient with longstanding hypothyroidism, on  levothyroxine therapy - she appears euthyroid -- latest thyroid labs reviewed with pt. >> normal: Lab Results  Component Value Date   TSH 1.67 12/23/2022  - she continues on LT4 75 mcg alternating with 50 mcg every other day, after she has had suppressed TSH levels on 88 and 75 mcg daily doses.  On 50 mcg daily, her TSH was 2.39, but she mentioned hair loss so I advised her to increase the dose to the equivalent of 62.5 mg daily - pt feels good on this dose. - we discussed about taking the thyroid hormone every day, with water, >30 minutes before breakfast, separated by >4 hours from acid reflux medications, calcium, iron, multivitamins. Pt. is taking it correctly.  At last visit, she was missing many doses, even sometimes when we had the so we discussed about the absolute need to improve compliance and for this, to try to move the thyroid medication to her nightstand to be able to take it first thing in the morning. She has to take Dexilant, but she was telling me that she was taking levothyroxine around 4:30 in the morning, which left enough time to take this and around 8 AM, 30 minutes before breakfast. - at this visit she tells me that when she forgets her levothyroxine dose, she returns to take it later.  I advised her not to do so, as she is taking the PPI in the morning.  I did advise her to move the dose to the next day and take the 2 doses together then.  She will do so. - will check thyroid tests in 1.5 months: TSH and fT4 - I we will see her back in 6 months but possibly sooner for labs  2.  Hair loss -She mentions that she initially had hair breakage after she started a higher dose of levothyroxine when she was diagnosed with hypothyroidism and this started again when she decreased the dose to 50 mcg daily recently.   -Per her description, the hair loss is female pattern -She feels that the hair loss improved after increasing the dose of levothyroxine. -For now we will continue the same  doses  Orders Placed This Encounter  Procedures   TSH   T4, free   Carlus Pavlov, MD PhD [facility]

## 2023-01-19 DIAGNOSIS — H43813 Vitreous degeneration, bilateral: Secondary | ICD-10-CM | POA: Diagnosis not present

## 2023-01-19 DIAGNOSIS — H524 Presbyopia: Secondary | ICD-10-CM | POA: Diagnosis not present

## 2023-01-20 ENCOUNTER — Ambulatory Visit: Payer: PPO | Admitting: Orthopaedic Surgery

## 2023-01-25 ENCOUNTER — Other Ambulatory Visit: Payer: Self-pay | Admitting: Internal Medicine

## 2023-01-25 DIAGNOSIS — E039 Hypothyroidism, unspecified: Secondary | ICD-10-CM

## 2023-01-27 ENCOUNTER — Other Ambulatory Visit (INDEPENDENT_AMBULATORY_CARE_PROVIDER_SITE_OTHER): Payer: PPO

## 2023-01-27 ENCOUNTER — Ambulatory Visit: Payer: PPO | Admitting: Orthopaedic Surgery

## 2023-01-27 ENCOUNTER — Encounter: Payer: Self-pay | Admitting: Orthopaedic Surgery

## 2023-01-27 VITALS — BP 117/80 | HR 86 | Ht <= 58 in | Wt 154.8 lb

## 2023-01-27 DIAGNOSIS — G8929 Other chronic pain: Secondary | ICD-10-CM

## 2023-01-27 DIAGNOSIS — M25562 Pain in left knee: Secondary | ICD-10-CM

## 2023-01-27 MED ORDER — METHYLPREDNISOLONE ACETATE 40 MG/ML IJ SUSP: 40.0000 mg | Freq: Once | INTRAMUSCULAR | Status: DC

## 2023-01-27 NOTE — Patient Instructions (Signed)
While we are working on your approval for MRI please go ahead and call to schedule your appointment with [facility] within at least one (1) week.   Central Scheduling [phone]  

## 2023-01-27 NOTE — Progress Notes (Signed)
My knee hurts more.  She has had more pain in the left knee with swelling and popping.  She has no giving way.  She has no new trauma.  Left knee has effusion, crepitus, ROM 0 to 110, slight limp left, NV intact, no distal edema, positive medial McMurray.  X-rays were done of the left knee, reported separately.  Encounter Diagnosis  Name Primary?   Chronic pain of left knee Yes   I will get a MRI of the left knee.  Return in one month.  Call if any problem.  Precautions discussed.  Electronically Signed Darreld Mclean, MD 7/30/20241:37 PM

## 2023-01-29 ENCOUNTER — Ambulatory Visit: Payer: PPO | Admitting: Family Medicine

## 2023-01-31 ENCOUNTER — Ambulatory Visit (HOSPITAL_COMMUNITY)
Admission: RE | Admit: 2023-01-31 | Discharge: 2023-01-31 | Disposition: A | Discharge status: Home / Self Care | Payer: PPO | Source: Ambulatory Visit | Attending: Orthopaedic Surgery | Admitting: Orthopaedic Surgery

## 2023-01-31 DIAGNOSIS — M25462 Effusion, left knee: Secondary | ICD-10-CM | POA: Diagnosis not present

## 2023-01-31 DIAGNOSIS — M25562 Pain in left knee: Secondary | ICD-10-CM | POA: Diagnosis not present

## 2023-01-31 DIAGNOSIS — G8929 Other chronic pain: Secondary | ICD-10-CM | POA: Diagnosis not present

## 2023-01-31 DIAGNOSIS — M659 Synovitis and tenosynovitis, unspecified: Secondary | ICD-10-CM | POA: Diagnosis not present

## 2023-01-31 DIAGNOSIS — M7122 Synovial cyst of popliteal space [Baker], left knee: Secondary | ICD-10-CM | POA: Diagnosis not present

## 2023-01-31 DIAGNOSIS — M1712 Unilateral primary osteoarthritis, left knee: Secondary | ICD-10-CM | POA: Diagnosis not present

## 2023-02-02 DIAGNOSIS — H02831 Dermatochalasis of right upper eyelid: Secondary | ICD-10-CM | POA: Diagnosis not present

## 2023-02-02 DIAGNOSIS — H01001 Unspecified blepharitis right upper eyelid: Secondary | ICD-10-CM | POA: Diagnosis not present

## 2023-02-02 DIAGNOSIS — H02834 Dermatochalasis of left upper eyelid: Secondary | ICD-10-CM | POA: Diagnosis not present

## 2023-02-02 DIAGNOSIS — H401131 Primary open-angle glaucoma, bilateral, mild stage: Secondary | ICD-10-CM | POA: Diagnosis not present

## 2023-02-04 ENCOUNTER — Encounter: Payer: Self-pay | Admitting: Internal Medicine

## 2023-02-04 ENCOUNTER — Ambulatory Visit (INDEPENDENT_AMBULATORY_CARE_PROVIDER_SITE_OTHER): Payer: PPO | Admitting: Internal Medicine

## 2023-02-04 VITALS — BP 128/88 | HR 84 | Temp 97.1°F | Ht <= 58 in | Wt 155.1 lb

## 2023-02-04 DIAGNOSIS — K5904 Chronic idiopathic constipation: Secondary | ICD-10-CM

## 2023-02-04 DIAGNOSIS — R1031 Right lower quadrant pain: Secondary | ICD-10-CM | POA: Diagnosis not present

## 2023-02-04 DIAGNOSIS — K219 Gastro-esophageal reflux disease without esophagitis: Secondary | ICD-10-CM

## 2023-02-04 DIAGNOSIS — K449 Diaphragmatic hernia without obstruction or gangrene: Secondary | ICD-10-CM

## 2023-02-04 MED ORDER — DEXLANSOPRAZOLE 60 MG PO CPDR: 60.0000 mg | DELAYED_RELEASE_CAPSULE | Freq: Every day | ORAL | 3 refills | Status: DC

## 2023-02-04 NOTE — Progress Notes (Unsigned)
Referring Provider: Kerri Perches, MD Primary Care Physician:  Kerri Perches, MD Primary GI:  Dr. Marletta Lor  Chief Complaint  Patient presents with   Follow-up    Patient here today due to Right lower abdominal pain. Has had an abdominal x ray on 12/08/2022 and a CT abd on 12/16/2022 which showed  IMPRESSION: *No acute inflammatory process identified within the abdomen or pelvis. Unremarkable appendix. *Other nonacute observations, as described above.Patient says she is taking miralax prn,but still having issues with constipation. Patient says she taking lansoprazole 30 mg daily this controls her symptoms for the most part but she has a lot of gas.     HPI:   Kim Grimes is a 72 y.o. female who presents to clinic today for follow-up visit.  She has a history of chronic GERD, hiatal hernia, H. pylori gastritis, constipation, chronic abdominal pain.  Previous workup below:  EGD 01/19/2012: -Small hernia -Gastritis -Fundic gland polyp and biopsies negative for H. pylori   Colonoscopy 07/2016: -2 polyps at the hepatic flexure and mid ascending colon -Internal hemorrhoids -Redundant left colon -Repeat colonoscopy in 5 years   EGD 04/2022: -Small hiatal hernia -Scattered mild inflammation characterized by erythema in the gastric body -Multiple 3-8 mm sessile polyps with no bleeding -Pathology revealed benign fundic gland polyps, gastric biopsy negative for H. Pylori   Colonoscopy 12/24/2021 for surveillance: -1 tubular adenoma removed -Due for repeat in 2028  GERD: Not currently well-controlled on lansoprazole.  Has tried Nexium and omeprazole.  Dexilant worked the best though cost has been an issue.  Was given samples of Voquezna but did not take this after reading side effect profile.  Abdominal pain, new, likely musculoskeletal.  Recent CT abdomen pelvis unremarkable.  Patient is quite active playing pickle ball.  Constipation relatively  well-controlled  Past Medical History:  Diagnosis Date   Anxiety disorder    Carotid artery stenosis    Chronic pelvic pain in female    Coronary artery disease    Endometriosis    GERD (gastroesophageal reflux disease)    Helicobacter pylori gastritis    Dr. Juanda Chance    Hiatal hernia 2003   on UGI    Hypothyroidism 2009   IBS (irritable bowel syndrome) 2003   Obesity (BMI 30-39.9) DEC 2011 145 LBS   Osteoporosis    PONV (postoperative nausea and vomiting)     Past Surgical History:  Procedure Laterality Date   ABDOMINAL HYSTERECTOMY     adhesiolysis     dense adhesion between rectum and sigmoid,  & pelvis    BIOPSY  05/20/2022   Procedure: BIOPSY;  Surgeon: Lanelle Bal, DO;  [facility]: [facility];  Service: Endoscopy;;   BRAVO PH STUDY  01/19/2012   Procedure: BRAVO PH STUDY;  Surgeon: West Bali, MD;  Location: [facility];  Service: Endoscopy;;   CATARACT EXTRACTION W/PHACO Left 06/03/2019   Procedure: CATARACT EXTRACTION PHACO AND INTRAOCULAR LENS PLACEMENT LEFT EYE (CDE: 2.92);  Surgeon: Fabio Pierce, MD;  Location: [facility];  Service: Ophthalmology;  Laterality: Left;   COLONOSCOPY  03: [facility]/D & 08: SCREENING   WNL'S   COLONOSCOPY  07/28/2011   sessile polyp in ascending colon/internal hemorrhoids. Next colonoscopy January 2018   COLONOSCOPY N/A 08/01/2016   Procedure: COLONOSCOPY;  Surgeon: West Bali, MD;  Location: [facility];  Service: Endoscopy;  Laterality: N/A;  930    COLONOSCOPY WITH PROPOFOL N/A 12/24/2021   Procedure: COLONOSCOPY WITH PROPOFOL;  Surgeon: Lanelle Bal, DO;  [facility]: [facility];  Service: Endoscopy;  Laterality: N/A;  9:00am   ESOPHAGOGASTRODUODENOSCOPY  01/19/2012   SLF: SMALL Hiatal hernia/Mild gastritis   ESOPHAGOGASTRODUODENOSCOPY (EGD) WITH PROPOFOL N/A 05/20/2022   Procedure: ESOPHAGOGASTRODUODENOSCOPY (EGD) WITH PROPOFOL;  Surgeon: Lanelle Bal, DO;  [facility];  Service: Endoscopy;  Laterality:  N/A;  12:45 pm, pt knows to arrive at 7:00   EYE SURGERY Right 10/18/2013   catarat extraction   LAPAROSCOPY     OLECRANON BURSECTOMY Left 07/01/2018   Procedure: LEFT ELBOW OLECRANON BURSECTOMY AND SPUR EXCISION;  Surgeon: Vickki Hearing, MD;  Location: [facility];  Service: Orthopedics;  Laterality: Left;   POLYPECTOMY  12/24/2021   Procedure: POLYPECTOMY INTESTINAL;  Surgeon: Lanelle Bal, DO;  [facility];  Service: Endoscopy;;   rt foot sx Bilateral    s/p hysterectomy     SALPINGOOPHORECTOMY     UPPER GASTROINTESTINAL ENDOSCOPY  DEC 2010: DYSPEPSIA/DYSPHAGIA   SCH RING/ESO DIL 16 MM, GASTRITIS/DUODENITIS 2o to Aleve/on OMP PRN    Current Outpatient Medications  Medication Sig Dispense Refill   azelastine (ASTELIN) 0.1 % nasal spray Place 1 spray into both nostrils 2 (two) times daily as needed for rhinitis. Use in each nostril as directed     lansoprazole (PREVACID) 30 MG capsule Take 1 capsule (30 mg total) by mouth daily before breakfast. 90 capsule 3   levothyroxine (SYNTHROID) 50 MCG tablet TAKE 1 TABLET BY MOUTH EVERY DAY BEFORE BREAKFAST (Patient taking differently: 50 mcg alternates with 75 mcg every other day.) 90 tablet 0   levothyroxine (SYNTHROID) 75 MCG tablet Take 1 tablet (75 mcg total) by mouth every other day. (Patient taking differently: Take 75 mcg by mouth every other day. 75 mcg alternates with 50 mcg every other day.) 45 tablet 1   montelukast (SINGULAIR) 10 MG tablet TAKE 1 TABLET BY MOUTH EVERYDAY AT BEDTIME (Patient taking differently: Take 10 mg by mouth daily as needed (allergies).) 90 tablet 1   Current Facility-Administered Medications  Medication Dose Route Frequency Provider Last Rate Last Admin   methylPREDNISolone acetate (DEPO-MEDROL) injection 40 mg  40 mg Intra-articular Once Darreld Mclean, MD        Allergies as of 02/04/2023 - Review Complete 02/04/2023  Allergen Reaction Noted   Aspirin Other (See Comments) 05/09/2014    Diazepam Other (See Comments)    Prednisone Other (See Comments) 02/21/2020    Family History  Problem Relation Age of Onset   Prostate cancer Father    Colon cancer Father        < 35 YO   Cancer Father        prostate and colon   Pancreatic cancer Mother    Cancer Mother        pancreas   Diabetes Mother    Diabetes Brother    Cancer Brother        prostate    Diabetes Sister        prediabetic   Heart disease Brother        CHF   Thyroid disease Neg Hx     Social History   Socioeconomic History   Marital status: Divorced    Spouse name: Not on file   Number of children: Not on file   Years of education: Not on file   Highest education level: Not on file  Occupational History   Occupation: fulltime at [employer]   Tobacco Use  Smoking status: Never   Smokeless tobacco: Never   Tobacco comments:    Never smoked  Vaping Use   Vaping status: Never Used  Substance and Sexual Activity   Alcohol use: Not Currently    Comment: occasional, maybe once/year   Drug use: No   Sexual activity: Not Currently    Birth control/protection: None  Other Topics Concern   Not on file  Social History Narrative   Not on file   Social Determinants of Health   Financial Resource Strain: Low Risk  (03/25/2022)   Overall Financial Resource Strain (CARDIA)    Difficulty of Paying Living Expenses: Not hard at all  Food Insecurity: No Food Insecurity (03/25/2022)   Hunger Vital Sign    Worried About Running Out of Food in the Last Year: Never true    Ran Out of Food in the Last Year: Never true  Transportation Needs: No Transportation Needs (03/25/2022)   PRAPARE - Administrator, Civil Service (Medical): No    Lack of Transportation (Non-Medical): No  Physical Activity: Insufficiently Active (03/25/2022)   Exercise Vital Sign    Days of Exercise per Week: 3 days    Minutes of Exercise per Session: 30 min  Stress: No Stress Concern Present (03/25/2022)    [employer] of Occupational Health - Occupational Stress Questionnaire    Feeling of Stress : Not at all  Social Connections: Moderately Integrated (03/25/2022)   Social Connection and Isolation Panel [NHANES]    Frequency of Communication with Friends and Family: Three times a week    Frequency of Social Gatherings with Friends and Family: More than three times a week    Attends Religious Services: More than 4 times per year    Active Member of [employer] or Organizations: Yes    Attends Engineer, structural: More than 4 times per year    Marital Status: Divorced    Subjective: Review of Systems  Constitutional:  Negative for chills and fever.  HENT:  Negative for congestion and hearing loss.   Eyes:  Negative for blurred vision and double vision.  Respiratory:  Negative for cough and shortness of breath.   Cardiovascular:  Negative for chest pain and palpitations.  Gastrointestinal:  Negative for abdominal pain, blood in stool, constipation, diarrhea, heartburn, melena and vomiting.  Genitourinary:  Negative for dysuria and urgency.  Musculoskeletal:  Negative for joint pain and myalgias.  Skin:  Negative for itching and rash.  Neurological:  Negative for dizziness and headaches.  Psychiatric/Behavioral:  Negative for depression. The patient is not nervous/anxious.      Objective: BP 128/88 (BP Location: Left Arm, Patient Position: Sitting, Cuff Size: Normal)   Pulse 84   Temp (!) 97.1 F (36.2 C) (Temporal)   Ht 4\' 10"  (1.473 m)   Wt 155 lb 1.6 oz (70.4 kg)   BMI 32.42 kg/m  Physical Exam Constitutional:      Appearance: Normal appearance.  HENT:     Head: Normocephalic and atraumatic.  Eyes:     Extraocular Movements: Extraocular movements intact.     Conjunctiva/sclera: Conjunctivae normal.  Cardiovascular:     Rate and Rhythm: Normal rate and regular rhythm.  Pulmonary:     Effort: Pulmonary effort is normal.     Breath sounds: Normal breath sounds.   Abdominal:     General: Bowel sounds are normal.     Palpations: Abdomen is soft.  Musculoskeletal:        General: No  swelling. Normal range of motion.     Cervical back: Normal range of motion and neck supple.  Skin:    General: Skin is warm and dry.     Coloration: Skin is not jaundiced.  Neurological:     General: No focal deficit present.     Mental Status: She is alert and oriented to person, place, and time.  Psychiatric:        Mood and Affect: Mood normal.        Behavior: Behavior normal.      Assessment/Plan:  1.  Chronic GERD-not well-controlled on current regimen of lansoprazole.  Previously trialed and failed Nexium and omeprazole.  Dexilant worked the best, will represcribe this.  Patient understands there may be a cost issue but she is willing to go back on this medication to have her symptoms better control.  2.  Constipation-well-controlled, continue on MiraLAX.  3.  Abdominal pain-CT abdomen pelvis reassuring.  Likely musculoskeletal as patient is quite active.  Will continue to monitor for now.  She does note this is feeling better as of late.  Follow-up in 3 to 4 months.  02/04/2023 3:21 PM   Disclaimer: This note was dictated with voice recognition software. Similar sounding words can inadvertently be transcribed and may not be corrected upon review.

## 2023-02-04 NOTE — Patient Instructions (Signed)
I will change your lansoprazole to Dexilant 60 mg daily.  Let us know if there are issues with insurance.  Continue on MiraLAX for your constipation.  Follow-up in 3 months.  It was very nice seeing you again today.  Dr. Marletta Lor

## 2023-02-05 ENCOUNTER — Ambulatory Visit: Payer: PPO | Admitting: Family Medicine

## 2023-02-10 ENCOUNTER — Encounter: Payer: Self-pay | Admitting: Orthopaedic Surgery

## 2023-02-10 ENCOUNTER — Ambulatory Visit: Payer: PPO | Admitting: Orthopaedic Surgery

## 2023-02-10 VITALS — BP 122/84 | HR 86 | Ht <= 58 in | Wt 154.0 lb

## 2023-02-10 DIAGNOSIS — G8929 Other chronic pain: Secondary | ICD-10-CM

## 2023-02-10 DIAGNOSIS — M23222 Derangement of posterior horn of medial meniscus due to old tear or injury, left knee: Secondary | ICD-10-CM

## 2023-02-10 NOTE — Progress Notes (Signed)
My knee is sore at times still.  She had MRI of the left knee showing: IMPRESSION: 1. Progressive free edge fraying of the posterior horn of the medial meniscus without well-defined radial tear or progressive meniscal extrusion. The lateral meniscus, cruciate and collateral ligaments are intact. 2. Mildly progressive tricompartmental degenerative changes, most advanced in the medial compartment. No acute osseous findings. 3. Enlarging moderate-sized joint effusion and Baker's cyst with evidence of synovitis.  I have informed her of the findings.  I have independently reviewed and interpreted x-rays of this patient done at another site by another physician or qualified health professional.  Her left knee has slight effusion, crepitus, ROM 0 to 110, stable, no distal edema, NV intact.  Encounter Diagnosis  Name Primary?   Chronic pain of left knee Yes   I will have her take Aleve two at bedtime.  She says taking it during the day makes her very sleepy.  Return in one month.  Call if any problem.  Precautions discussed.  Electronically Signed Darreld Mclean, MD 8/13/20241:58 PM

## 2023-02-13 ENCOUNTER — Ambulatory Visit (INDEPENDENT_AMBULATORY_CARE_PROVIDER_SITE_OTHER): Payer: PPO | Admitting: Family Medicine

## 2023-02-13 ENCOUNTER — Encounter: Payer: Self-pay | Admitting: Family Medicine

## 2023-02-13 VITALS — BP 120/80 | HR 76 | Ht <= 58 in | Wt 152.0 lb

## 2023-02-13 DIAGNOSIS — Z13220 Encounter for screening for lipoid disorders: Secondary | ICD-10-CM

## 2023-02-13 DIAGNOSIS — Z09 Encounter for follow-up examination after completed treatment for conditions other than malignant neoplasm: Secondary | ICD-10-CM

## 2023-02-13 DIAGNOSIS — M1712 Unilateral primary osteoarthritis, left knee: Secondary | ICD-10-CM

## 2023-02-13 DIAGNOSIS — K219 Gastro-esophageal reflux disease without esophagitis: Secondary | ICD-10-CM

## 2023-02-13 DIAGNOSIS — R7989 Other specified abnormal findings of blood chemistry: Secondary | ICD-10-CM

## 2023-02-13 DIAGNOSIS — E559 Vitamin D deficiency, unspecified: Secondary | ICD-10-CM | POA: Diagnosis not present

## 2023-02-13 DIAGNOSIS — J3089 Other allergic rhinitis: Secondary | ICD-10-CM | POA: Diagnosis not present

## 2023-02-13 DIAGNOSIS — E039 Hypothyroidism, unspecified: Secondary | ICD-10-CM | POA: Diagnosis not present

## 2023-02-13 DIAGNOSIS — M7122 Synovial cyst of popliteal space [Baker], left knee: Secondary | ICD-10-CM | POA: Diagnosis not present

## 2023-02-13 NOTE — Patient Instructions (Signed)
Please schedule annual wellness  at checkout  F/U in 6 months  You are being referred to Ortho in [place] per your request we will  reach out with appt info( message will be left on your cell)   Fasting CBC, lipid, cmp and eGFR and Vi t d level in next  to 1 to 4 weeks, 12 hours only water and pls do drink it  RFSV Vaccine is recommended one time only  Nurse pls check and document  updated shingles and covid and TDAP vaccines ([facility])  Please get  most current covid vaccine when available  Wearing mask in publicspaces with current spike is appropriate  I will het manager know of your concerns  Thanks for choosing [facility], we consider it a privelige to serve you.

## 2023-02-17 ENCOUNTER — Ambulatory Visit: Payer: PPO | Admitting: Orthopaedic Surgery

## 2023-02-18 ENCOUNTER — Telehealth: Payer: Self-pay | Admitting: Family Medicine

## 2023-02-18 NOTE — Telephone Encounter (Signed)
Patient asked for nurse to return call about the orthopedic in [place].

## 2023-02-19 ENCOUNTER — Encounter: Payer: Self-pay | Admitting: Family Medicine

## 2023-02-19 DIAGNOSIS — M7122 Synovial cyst of popliteal space [Baker], left knee: Secondary | ICD-10-CM | POA: Insufficient documentation

## 2023-02-19 DIAGNOSIS — Z1231 Encounter for screening mammogram for malignant neoplasm of breast: Secondary | ICD-10-CM | POA: Diagnosis not present

## 2023-02-19 DIAGNOSIS — M1712 Unilateral primary osteoarthritis, left knee: Secondary | ICD-10-CM | POA: Insufficient documentation

## 2023-02-19 LAB — HM MAMMOGRAPHY

## 2023-02-19 NOTE — Assessment & Plan Note (Signed)
Hyperlipidemia:Low fat diet discussed and encouraged.   Lipid Panel  Lab Results  Component Value Date   CHOL 197 04/16/2022   HDL 63 04/16/2022   LDLCALC 118 (H) 04/16/2022   TRIG 91 04/16/2022   CHOLHDL 3.1 04/16/2022     Updated lab needed at/ before next visit.

## 2023-02-19 NOTE — Assessment & Plan Note (Signed)
Updated lab needed at/ before next visit.   

## 2023-02-19 NOTE — Assessment & Plan Note (Signed)
No current / recent flare but good response to meds she has when needed

## 2023-02-19 NOTE — Assessment & Plan Note (Signed)
Worsening pain , abn mRI, refer to new Ortho per pt request

## 2023-02-19 NOTE — Assessment & Plan Note (Signed)
Managed by GI , currently controlled

## 2023-02-19 NOTE — Progress Notes (Signed)
   Kim Grimes     MRN: [id number]      DOB: [birth year]  No chief complaint on file.   HPI Kim Grimes is here for follow up and re-evaluation of chronic medical conditions, medication management and review of any available recent lab and radiology data.  Preventive health is updated, specifically  Cancer screening and Immunization.   Questions or concerns regarding consultations or procedures which the PT has had in the interim are  addressed. Wants to be evaluated by new ortho re left knee pain and swelling, recent MRI shows arthritis, baker's cyst etc and wants definitive management which she feels was not offered at her most recent Ortho visit   ROS Denies recent fever or chills. Denies sinus pressure, nasal congestion, ear pain or sore throat. Denies chest congestion, productive cough or wheezing. Denies chest pains, palpitations and leg swelling Denies abdominal pain, nausea, vomiting,diarrhea or constipation.   Denies dysuria, frequency, hesitancy or incontinence.  Denies headaches, seizures, numbness, or tingling. Denies depression, anxiety or insomnia. Denies skin break down or rash.   PE  BP 120/80   Pulse 76   Ht 4\' 10"  (1.473 m)   Wt 152 lb (68.9 kg)   SpO2 94%   BMI 31.77 kg/m   Patient alert and oriented and in no cardiopulmonary distress.  HEENT: No facial asymmetry, EOMI,     Neck supple .  Chest: Clear to auscultation bilaterally.  CVS: S1, S2 no murmurs, no S3.Regular rate.  ABD: Soft non tender.   Ext: No edema  MS: Adequate ROM spine, shoulders, hips and reduced in left  knee which is swollen and tender anterior asopect.  Skin: Intact, no ulcerations or rash noted.  Psych: Good eye contact, normal affect. Memory intact not anxious or depressed appearing.  CNS: CN 2-12 intact, power,  normal throughout.no focal deficits noted.   Assessment & Plan  Hypothyroidism Managed by Endo and controlled  GERD (gastroesophageal reflux  disease) Managed by GI , currently controlled  Vitamin D deficiency Updated lab needed at/ before next visit.   Arthritis of left knee Worsening pain , abn mRI, refer to new Ortho per pt request  Baker's cyst of knee, left Increasingly symptomatic, Ortho eval, 2nd opinion per pt request  Allergic rhinitis No current / recent flare but good response to meds she has when needed  High serum low-density lipoprotein (LDL) Hyperlipidemia:Low fat diet discussed and encouraged.   Lipid Panel  Lab Results  Component Value Date   CHOL 197 04/16/2022   HDL 63 04/16/2022   LDLCALC 118 (H) 04/16/2022   TRIG 91 04/16/2022   CHOLHDL 3.1 04/16/2022     Updated lab needed at/ before next visit.

## 2023-02-19 NOTE — Assessment & Plan Note (Signed)
Managed by Endo and controlled 

## 2023-02-19 NOTE — Assessment & Plan Note (Signed)
Increasingly symptomatic, Ortho eval, 2nd opinion per pt request

## 2023-02-19 NOTE — Telephone Encounter (Signed)
Pt wants a call back in regard to ortho referral. Has not heard anything back in regard.

## 2023-02-26 ENCOUNTER — Other Ambulatory Visit (INDEPENDENT_AMBULATORY_CARE_PROVIDER_SITE_OTHER): Payer: PPO

## 2023-02-26 DIAGNOSIS — E039 Hypothyroidism, unspecified: Secondary | ICD-10-CM

## 2023-02-27 LAB — T4, FREE: Free T4: 1.28 ng/dL (ref 0.60–1.60)

## 2023-02-27 LAB — TSH: TSH: 1.52 u[IU]/mL (ref 0.35–5.50)

## 2023-03-05 ENCOUNTER — Encounter: Payer: Self-pay | Admitting: Orthopaedic Surgery

## 2023-03-05 ENCOUNTER — Ambulatory Visit (INDEPENDENT_AMBULATORY_CARE_PROVIDER_SITE_OTHER): Payer: PPO | Admitting: Orthopaedic Surgery

## 2023-03-05 VITALS — Ht <= 58 in | Wt 154.0 lb

## 2023-03-05 DIAGNOSIS — M7122 Synovial cyst of popliteal space [Baker], left knee: Secondary | ICD-10-CM

## 2023-03-05 DIAGNOSIS — M1712 Unilateral primary osteoarthritis, left knee: Secondary | ICD-10-CM

## 2023-03-05 NOTE — Progress Notes (Signed)
Office Visit Note   Patient: Kim Grimes           Date of Birth: Nov 21, 1950           MRN: [id number] Visit Date: 03/05/2023              Requested by: Kerri Perches, MD [address], [address] [place],  [place] [address] PCP: Kerri Perches, MD   Assessment & Plan: Visit Diagnoses:  1. Arthritis of left knee   2. Baker's cyst of knee, left     Plan: Injection performed probably should get some good relief if she has ongoing symptoms she can let us know and return.  MRI findings and plain radiographs findings were reviewed in detail.  Follow-Up Instructions: No follow-ups on file.   Orders:  Orders Placed This Encounter  Procedures   Large Joint Inj   No orders of the defined types were placed in this encounter.     Procedures: Large Joint Inj: L knee on 03/10/2023 4:29 PM Indications: joint swelling and pain Details: 22 G 1.5 in needle, anterolateral approach  Arthrogram: No  Medications: 0.5 mL lidocaine 1 %; 3 mL bupivacaine 0.5 %; 40 mg methylPREDNISolone acetate 40 MG/ML Outcome: tolerated well, no immediate complications Procedure, treatment alternatives, risks and benefits explained, specific risks discussed. Consent was given by the patient. Immediately prior to procedure a time out was called to verify the correct patient, procedure, equipment, support staff and site/side marked as required. Patient was prepped and draped in the usual sterile fashion.       Clinical Data: No additional findings.   Subjective: Chief Complaint  Patient presents with   Left Knee - Pain, Edema    Patient has pain and swelling in the left knee had xrays at [facility] and mri at [facility]    HPI 72 year old female with left knee pain difficulty walking.  She had x-rays done at [facility] which showed mild tricompartmental degenerative changes no acute findings.  She saw Dr. Hilda Lias for this.  MRI scan 02/09/2023 showed progressive posterior  Nischal fraying without well-defined radial progressive meniscal extrusion.  Mild tricompartmental degenerative changes most advanced in the medial compartment.  Moderate effusion and Baker's cyst present.  Review of Systems 14 point systems are noncontributory to HPI.   Objective: Vital Signs: Ht 4\' 10"  (1.473 m)   Wt 154 lb (69.9 kg)   BMI 32.19 kg/m   Physical Exam Constitutional:      Appearance: She is well-developed.  HENT:     Head: Normocephalic.     Right Ear: External ear normal.     Left Ear: External ear normal. There is no impacted cerumen.  Eyes:     Pupils: Pupils are equal, round, and reactive to light.  Neck:     Thyroid: No thyromegaly.     Trachea: No tracheal deviation.  Cardiovascular:     Rate and Rhythm: Normal rate.  Pulmonary:     Effort: Pulmonary effort is normal.  Abdominal:     Palpations: Abdomen is soft.  Musculoskeletal:     Cervical back: No rigidity.  Skin:    General: Skin is warm and dry.  Neurological:     Mental Status: She is alert and oriented to person, place, and time.  Psychiatric:        Behavior: Behavior normal.     Ortho Exam left knee effusion more medial than lateral tenderness pain with patellofemoral loading negative logroll  hip Station the foot is intact knee and ankle jerk are intact.  Specialty Comments:  No specialty comments available.  Imaging: No results found.   PMFS History: Patient Active Problem List   Diagnosis Date Noted   Arthritis of left knee 02/19/2023   Baker's cyst of knee, left 02/19/2023   Great toe pain, left 07/30/2022   Contact dermatitis 10/24/2021   Tinea cruris 10/24/2021   Confusion 09/03/2021   Cough 12/13/2020   Skin hypopigmentation 02/03/2016   Olecranon bursitis of left elbow s/p bursectomy 07/01/18 08/31/2015   IGT (impaired glucose tolerance) 05/09/2014   High serum low-density lipoprotein (LDL) 05/09/2014   Occlusion and stenosis of carotid artery without mention of  cerebral infarction 05/16/2013   Allergic rhinitis 12/09/2012   Vitamin D deficiency 09/17/2012   LPRD (laryngopharyngeal reflux disease) 12/30/2011   Carotid artery bruit 09/18/2011   IBS (irritable bowel syndrome) 01/07/2011   GERD (gastroesophageal reflux disease) 01/07/2011   Hypothyroidism 11/09/2009   Past Medical History:  Diagnosis Date   Anxiety disorder    Carotid artery stenosis    Chronic pelvic pain in female    Coronary artery disease    Endometriosis    GERD (gastroesophageal reflux disease)    Helicobacter pylori gastritis    Dr. Juanda Chance    Hiatal hernia 2003   on UGI    Hypothyroidism 2009   IBS (irritable bowel syndrome) 2003   Obesity (BMI 30-39.9) DEC 2011 145 LBS   Osteoporosis    PONV (postoperative nausea and vomiting)     Family History  Problem Relation Age of Onset   Prostate cancer Father    Colon cancer Father        < 49 YO   Cancer Father        prostate and colon   Pancreatic cancer Mother    Cancer Mother        pancreas   Diabetes Mother    Diabetes Brother    Cancer Brother        prostate    Diabetes Sister        prediabetic   Heart disease Brother        CHF   Thyroid disease Neg Hx     Past Surgical History:  Procedure Laterality Date   ABDOMINAL HYSTERECTOMY     adhesiolysis     dense adhesion between rectum and sigmoid,  & pelvis    BIOPSY  05/20/2022   Procedure: BIOPSY;  Surgeon: Lanelle Bal, DO;  [facility];  Service: Endoscopy;;   BRAVO PH STUDY  01/19/2012   Procedure: BRAVO PH STUDY;  Surgeon: West Bali, MD;  Location: [facility];  Service: Endoscopy;;   CATARACT EXTRACTION W/PHACO Left 06/03/2019   Procedure: CATARACT EXTRACTION PHACO AND INTRAOCULAR LENS PLACEMENT LEFT EYE (CDE: 2.92);  Surgeon: Fabio Pierce, MD;  Location: [facility];  Service: Ophthalmology;  Laterality: Left;   COLONOSCOPY  03: [facility]/D & 08: SCREENING   WNL'S   COLONOSCOPY  07/28/2011   sessile polyp in ascending  colon/internal hemorrhoids. Next colonoscopy January 2018   COLONOSCOPY N/A 08/01/2016   Procedure: COLONOSCOPY;  Surgeon: West Bali, MD;  [facility]: [facility];  Service: Endoscopy;  Laterality: N/A;  930    COLONOSCOPY WITH PROPOFOL N/A 12/24/2021   Procedure: COLONOSCOPY WITH PROPOFOL;  Surgeon: Lanelle Bal, DO;  [facility];  Service: Endoscopy;  Laterality: N/A;  9:00am   ESOPHAGOGASTRODUODENOSCOPY  01/19/2012   SLF:  SMALL Hiatal hernia/Mild gastritis   ESOPHAGOGASTRODUODENOSCOPY (EGD) WITH PROPOFOL N/A 05/20/2022   Procedure: ESOPHAGOGASTRODUODENOSCOPY (EGD) WITH PROPOFOL;  Surgeon: Lanelle Bal, DO;  [facility];  Service: Endoscopy;  Laterality: N/A;  12:45 pm, pt knows to arrive at 7:00   EYE SURGERY Right 10/18/2013   catarat extraction   LAPAROSCOPY     OLECRANON BURSECTOMY Left 07/01/2018   Procedure: LEFT ELBOW OLECRANON BURSECTOMY AND SPUR EXCISION;  Surgeon: Vickki Hearing, MD;  Location: [facility];  Service: Orthopedics;  Laterality: Left;   POLYPECTOMY  12/24/2021   Procedure: POLYPECTOMY INTESTINAL;  Surgeon: Lanelle Bal, DO;  [facility];  Service: Endoscopy;;   rt foot sx Bilateral    s/p hysterectomy     SALPINGOOPHORECTOMY     UPPER GASTROINTESTINAL ENDOSCOPY  DEC 2010: DYSPEPSIA/DYSPHAGIA   SCH RING/ESO DIL 16 MM, GASTRITIS/DUODENITIS 2o to Aleve/on OMP PRN   Social History   Occupational History   Occupation: fulltime at [employer]   Tobacco Use   Smoking status: Never   Smokeless tobacco: Never   Tobacco comments:    Never smoked  Vaping Use   Vaping status: Never Used  Substance and Sexual Activity   Alcohol use: Not Currently    Comment: occasional, maybe once/year   Drug use: No   Sexual activity: Not Currently    Birth control/protection: None

## 2023-03-06 ENCOUNTER — Telehealth: Payer: Self-pay

## 2023-03-06 DIAGNOSIS — J45909 Unspecified asthma, uncomplicated: Secondary | ICD-10-CM | POA: Diagnosis not present

## 2023-03-06 DIAGNOSIS — R29890 Loss of height: Secondary | ICD-10-CM | POA: Diagnosis not present

## 2023-03-06 DIAGNOSIS — K5190 Ulcerative colitis, unspecified, without complications: Secondary | ICD-10-CM | POA: Diagnosis not present

## 2023-03-06 DIAGNOSIS — M8588 Other specified disorders of bone density and structure, other site: Secondary | ICD-10-CM | POA: Diagnosis not present

## 2023-03-06 LAB — HM DEXA SCAN

## 2023-03-09 NOTE — Telephone Encounter (Signed)
Appointment made

## 2023-03-10 ENCOUNTER — Ambulatory Visit: Payer: PPO | Admitting: Orthopaedic Surgery

## 2023-03-10 DIAGNOSIS — M7122 Synovial cyst of popliteal space [Baker], left knee: Secondary | ICD-10-CM

## 2023-03-10 DIAGNOSIS — M1712 Unilateral primary osteoarthritis, left knee: Secondary | ICD-10-CM | POA: Diagnosis not present

## 2023-03-10 MED ORDER — METHYLPREDNISOLONE ACETATE 40 MG/ML IJ SUSP
40.0000 mg | INTRAMUSCULAR | Status: AC | PRN
Start: 2023-03-10 — End: 2023-03-10
  Administered 2023-03-10: 40 mg via INTRA_ARTICULAR

## 2023-03-10 MED ORDER — LIDOCAINE HCL 1 % IJ SOLN
0.5000 mL | INTRAMUSCULAR | Status: AC | PRN
Start: 2023-03-10 — End: 2023-03-10
  Administered 2023-03-10: .5 mL

## 2023-03-10 MED ORDER — BUPIVACAINE HCL 0.5 % IJ SOLN
3.0000 mL | INTRAMUSCULAR | Status: AC | PRN
Start: 2023-03-10 — End: 2023-03-10
  Administered 2023-03-10: 3 mL via INTRA_ARTICULAR

## 2023-03-13 DIAGNOSIS — E559 Vitamin D deficiency, unspecified: Secondary | ICD-10-CM | POA: Diagnosis not present

## 2023-03-13 DIAGNOSIS — Z13220 Encounter for screening for lipoid disorders: Secondary | ICD-10-CM | POA: Diagnosis not present

## 2023-03-13 DIAGNOSIS — Z09 Encounter for follow-up examination after completed treatment for conditions other than malignant neoplasm: Secondary | ICD-10-CM | POA: Diagnosis not present

## 2023-03-14 LAB — CBC WITH DIFFERENTIAL/PLATELET
Basophils Absolute: 0 x10E3/uL (ref 0.0–0.2)
Basos: 1 %
EOS (ABSOLUTE): 0.1 x10E3/uL (ref 0.0–0.4)
Eos: 2 %
Hematocrit: 40.8 % (ref 34.0–46.6)
Hemoglobin: 13.5 g/dL (ref 11.1–15.9)
Immature Grans (Abs): 0 x10E3/uL (ref 0.0–0.1)
Immature Granulocytes: 0 %
Lymphocytes Absolute: 1.8 x10E3/uL (ref 0.7–3.1)
Lymphs: 28 %
MCH: 29 pg (ref 26.6–33.0)
MCHC: 33.1 g/dL (ref 31.5–35.7)
MCV: 88 fL (ref 79–97)
Monocytes Absolute: 0.5 x10E3/uL (ref 0.1–0.9)
Monocytes: 7 %
Neutrophils Absolute: 4 x10E3/uL (ref 1.4–7.0)
Neutrophils: 62 %
Platelets: 265 x10E3/uL (ref 150–450)
RBC: 4.66 x10E6/uL (ref 3.77–5.28)
RDW: 13.1 % (ref 11.7–15.4)
WBC: 6.4 x10E3/uL (ref 3.4–10.8)

## 2023-03-14 LAB — CMP14+EGFR
ALT: 14 IU/L (ref 0–32)
AST: 18 IU/L (ref 0–40)
Albumin: 4 g/dL (ref 3.8–4.8)
Alkaline Phosphatase: 88 IU/L (ref 44–121)
BUN/Creatinine Ratio: 19 (ref 12–28)
BUN: 17 mg/dL (ref 8–27)
Bilirubin Total: 0.3 mg/dL (ref 0.0–1.2)
CO2: 24 mmol/L (ref 20–29)
Calcium: 9.1 mg/dL (ref 8.7–10.3)
Chloride: 105 mmol/L (ref 96–106)
Creatinine, Ser: 0.9 mg/dL (ref 0.57–1.00)
Globulin, Total: 1.9 g/dL (ref 1.5–4.5)
Glucose: 81 mg/dL (ref 70–99)
Potassium: 4.1 mmol/L (ref 3.5–5.2)
Sodium: 142 mmol/L (ref 134–144)
Total Protein: 5.9 g/dL — ABNORMAL LOW (ref 6.0–8.5)
eGFR: 68 mL/min/1.73

## 2023-03-14 LAB — LIPID PANEL
Chol/HDL Ratio: 3 ratio (ref 0.0–4.4)
Cholesterol, Total: 189 mg/dL (ref 100–199)
HDL: 62 mg/dL (ref >39)
LDL Chol Calc (NIH): 116 mg/dL — ABNORMAL HIGH (ref 0–99)
Triglycerides: 57 mg/dL (ref 0–149)
VLDL Cholesterol Cal: 11 mg/dL (ref 5–40)

## 2023-03-14 LAB — VITAMIN D 25 HYDROXY (VIT D DEFICIENCY, FRACTURES): Vit D, 25-Hydroxy: 30.9 ng/mL (ref 30.0–100.0)

## 2023-03-18 ENCOUNTER — Telehealth: Payer: Self-pay | Admitting: Family Medicine

## 2023-03-18 NOTE — Telephone Encounter (Signed)
Received after call message report call back on lab and xray results.

## 2023-03-19 ENCOUNTER — Ambulatory Visit: Payer: PPO | Admitting: Orthopedic Surgery

## 2023-03-19 NOTE — Telephone Encounter (Signed)
Patient calling back about xray / lab results. Asking for a call back today if at all possible.

## 2023-03-19 NOTE — Telephone Encounter (Signed)
LVM with results and advised her that they were sent to her mychart

## 2023-03-23 ENCOUNTER — Encounter: Payer: Self-pay | Admitting: Orthopedic Surgery

## 2023-03-23 ENCOUNTER — Ambulatory Visit: Payer: PPO | Admitting: Orthopedic Surgery

## 2023-03-23 VITALS — BP 153/90 | HR 79 | Ht <= 58 in | Wt 154.0 lb

## 2023-03-23 DIAGNOSIS — M1712 Unilateral primary osteoarthritis, left knee: Secondary | ICD-10-CM | POA: Diagnosis not present

## 2023-03-23 MED ORDER — MELOXICAM 7.5 MG PO TABS
7.5000 mg | ORAL_TABLET | Freq: Two times a day (BID) | ORAL | 5 refills | Status: DC
Start: 2023-03-23 — End: 2023-05-19

## 2023-03-23 NOTE — Progress Notes (Signed)
Chief Complaint  Patient presents with   Knee Pain    Left / states had injection left knee did not help     72 year old female status post MRI and x-rays and evaluation by Dr. Ophelia Charter and Dr. Hilda Lias for osteoarthritis of the left knee with MRI showing degeneration of the meniscus but no frank tear and arthritis in all 3 compartments patient has not had a lot of treatment for her knee but she has had 2 rounds of hyaluronic acid 1 cortisone injection and she wears a knee sleeve but no bracing  Comes in with increased pain over the last 3 days  Currently she is using IcyHot  She says her pain is a 15 out of 10 see so that is 10 out of 10)  Her exam shows that she has antalgic gait  The knee is not warm or hot but is very sensitive to touch globally around the patellofemoral joint  She has full range of motion  Skin is normal  Ligaments are stable  Assessment and plan 72 year old female grade 2 arthritis on x-ray grade 3 on MRI degeneration of meniscus  She does take a Dexilant so she has a little GI issue but short of surgery (and her x-rays do not warrant) if she does not take any medication her knee pain will continue and I told her this.  I told her to think of this like she does her hypothyroidism  Start meloxicam 7.5 mg twice a day  Return in a month  Meds ordered this encounter  Medications   meloxicam (MOBIC) 7.5 MG tablet    Sig: Take 1 tablet (7.5 mg total) by mouth 2 (two) times daily.    Dispense:  30 tablet    Refill:  5

## 2023-04-21 ENCOUNTER — Other Ambulatory Visit: Payer: Self-pay | Admitting: Internal Medicine

## 2023-04-21 DIAGNOSIS — E039 Hypothyroidism, unspecified: Secondary | ICD-10-CM

## 2023-05-04 ENCOUNTER — Ambulatory Visit: Payer: PPO | Admitting: Orthopedic Surgery

## 2023-05-04 ENCOUNTER — Encounter: Payer: Self-pay | Admitting: Orthopedic Surgery

## 2023-05-04 VITALS — Ht <= 58 in | Wt 154.0 lb

## 2023-05-04 DIAGNOSIS — G8929 Other chronic pain: Secondary | ICD-10-CM

## 2023-05-04 DIAGNOSIS — M1712 Unilateral primary osteoarthritis, left knee: Secondary | ICD-10-CM | POA: Diagnosis not present

## 2023-05-04 NOTE — Progress Notes (Signed)
Follow-up visit  Encounter Diagnoses  Name Primary?   Arthritis of left knee Yes   Primary osteoarthritis of left knee    Chronic pain of left knee    Chief Complaint  Patient presents with   Knee Pain    Left / taking meloxicam, no improvement     Not any better !   MRI IMPRESSION: 1. Progressive free edge fraying of the posterior horn of the medial meniscus without well-defined radial tear or progressive meniscal extrusion. The lateral meniscus, cruciate and collateral ligaments are intact. 2. Mildly progressive tricompartmental degenerative changes, most advanced in the medial compartment. No acute osseous findings. 3. Enlarging moderate-sized joint effusion and Baker's cyst with evidence of synovitis.  Medial: Moderate to severe medial compartment osteoarthritis with diffuse chondral thinning and surface irregularity. Previously demonstrated marrow edema in the medial tibial plateau has largely resolved.   Electronically Signed   By: Carey Bullocks M.D.   On: 02/09/2023 69:80  72 year old female no improvement after meloxicam  Patient has had a full constellation of treatment for this presumed osteoarthritis  She has had cortisone injections, hyaluronic acid injection, ibuprofen, Tylenol, meloxicam.  No improvement  Patient is very reluctant to take any medications as it interferes stomach and causes her to fall asleep  She did have relief with Voltaren gel but stopped after her pain improved.  We have advised her to restart that she is agreeable to that vs knee replacement

## 2023-05-07 ENCOUNTER — Ambulatory Visit: Payer: PPO | Admitting: Gastroenterology

## 2023-05-07 NOTE — Progress Notes (Unsigned)
   Established Patient Office Visit   Subjective  Patient ID: Kim Grimes, female    DOB: 09/15/1950  Age: 72 y.o. MRN: [id number]  No chief complaint on file.   She  has a past medical history of Anxiety disorder, Carotid artery stenosis, Chronic pelvic pain in female, Coronary artery disease, Endometriosis, GERD (gastroesophageal reflux disease), Helicobacter pylori gastritis, Hiatal hernia (2003), Hypothyroidism (2009), IBS (irritable bowel syndrome) (2003), Obesity (BMI 30-39.9) (DEC 2011 145 LBS), Osteoporosis, and PONV (postoperative nausea and vomiting).  HPI  ROS    Objective:     There were no vitals taken for this visit. {Vitals History (Optional):23777}  Physical Exam   No results found for any visits on 05/08/23.  The 10-year ASCVD risk score (Arnett DK, et al., 2019) is: 16.6%    Assessment & Plan:  There are no diagnoses linked to this encounter.  No follow-ups on file.   Cruzita Lederer Newman Nip, FNP

## 2023-05-07 NOTE — Patient Instructions (Signed)

## 2023-05-08 ENCOUNTER — Other Ambulatory Visit: Payer: Self-pay

## 2023-05-08 ENCOUNTER — Ambulatory Visit: Payer: PPO | Admitting: Family Medicine

## 2023-05-08 DIAGNOSIS — L989 Disorder of the skin and subcutaneous tissue, unspecified: Secondary | ICD-10-CM

## 2023-05-18 NOTE — Progress Notes (Unsigned)
GI Office Note    Referring Provider: Kerri Perches, MD Primary Care Physician:  Kerri Perches, MD Primary Gastroenterologist: Hennie Duos. Marletta Lor, DO  Date:  05/19/2023  ID:  Kim Grimes, DOB December 31, 1950, MRN [id number]   Chief Complaint   Chief Complaint  Patient presents with   Follow-up    Follow up. No problems     History of Present Illness  Kim Grimes is a 72 y.o. female with a history of H. pylori gastritis, GERD, constipation, hypothyroidism, CAD, anxiety, and hiatal hernia presenting today for follow-up.  EGD 01/19/2012: -Small hernia -Gastritis -Fundic gland polyp and biopsies negative for H. pylori   Colonoscopy 07/2016: -2 polyps at the hepatic flexure and mid ascending colon -Internal hemorrhoids -Redundant left colon -Repeat colonoscopy in 5 years   EGD 04/2022: -Small hiatal hernia -Scattered mild inflammation characterized by erythema in the gastric body -Multiple 3-8 mm sessile polyps with no bleeding -Pathology revealed benign fundic gland polyps, gastric biopsy negative for H. Pylori   Colonoscopy 12/24/2021 for surveillance: -1 tubular adenoma removed -Due for repeat in 2028   Office visit 08/20/2022.  Feeling well.  Works full-time at [employer]  Exercises regularly.  Dexilant expensive but seems to be helping hemoglobins and regurgitation issues she was having previously.  No dysphagia.  Denied constipation, melena, or BRBPR.  Concerned about upper abdominal distention that occurs with sitting up.  Advised to continue Dexilant and return as needed.  Office visit 12/08/22.  Reported pain in her lower abdomen with walking but not on palpation, ongoing for about 2 weeks.  Pain primarily in the right lower quadrant and have been getting progressively worse, improved with laying down.  Pain does not worsen after meals.  GERD controlled on Dexilant previously, lansoprazole not helping.  Continues to feel bloated all the time.  Tried  Nexium and omeprazole in the past, unsure if she had tried Aciphex.  Had some mild diarrhea related to ice cream but bowel movements coming and going.  History of hemorrhoids.  Advise trial of low close number for GERD and if not effective consider resuming Dexilant.  Advised KUB as well as a CT of the abdomen pelvis.  Consider short course of dicyclomine if needed and advised to alternate ice and heat to the area of pain.  KUB 12/08/2022: - Non-obstructed bowel gas pattern -Stool within the cecum, sigmoid, and rectum -Degenerative changes of the lumbar  CT A/P with contrast 12/16/2022: -Diffuse hepatic steatosis -Subcentimeter hypoattenuating focus in the inferior right hepatic lobe too small to adequately characterize, appears unchanged since prior study, favored to be benign -No extrahepatic ductal dilation -No evidence of cholelithiasis or cholecystitis -Multiple left renal sinus cysts -No bowel wall thickening or inflammation  Last office visit 02/04/2023 with Dr. Marletta Lor.  GERD not well-controlled with pantoprazole, noted ongoing cost issues with Dexilant.  Did not take samples of Eliquis now after reading side effect profile.  Suspected abdominal pain was secondary to MSK in nature given CT unremarkable.  Constipation relatively well-controlled.  Patient willing to go back on Dexilant despite cost issues to have better control of symptoms.  Advised to continue MiraLAX.  Advised continue to monitor abdominal pain, patient reported it was better as of late.  Advised to follow-up in 3-4 months  Today: GERD - Has not started on Dexilant yet. Still having reflux symptoms. Has a lot of burping and bubbles that she gets in the middle of the night and it regurgitates up. $200/month for  the dexilant. Has been taking omeprazole once daily  because she had so many leftover. N/V, dysphagia. No mucous and feels it come out and up and makes her "blow bubbles". Has been drinking sprite more instead of darker  sodas. Sleeps proppped up initialyl but twists and turns at nigth off the pillow.   Constipation - Having BM not everyday, sometimes every other day. Taking Algin and that has been helpful.   Went to [employer] for 2 weeks. Walking a mile a day. Has been upset due to her weight gain.  She reports she was 148 prior.   Had Covid vaccine and Flu vaccine in October.   Wt Readings from Last 3 Encounters:  05/19/23 157 lb 3.2 oz (71.3 kg)  05/04/23 154 lb (69.9 kg)  03/23/23 154 lb (69.9 kg)    Current Outpatient Medications  Medication Sig Dispense Refill   levothyroxine (SYNTHROID) 50 MCG tablet TAKE 1 TABLET BY MOUTH EVERY DAY BEFORE BREAKFAST (Patient taking differently: 50 mcg alternates with 75 mcg every other day.) 90 tablet 0   levothyroxine (SYNTHROID) 75 MCG tablet TAKE 1 TABLET BY MOUTH EVERY DAY 90 tablet 0   azelastine (ASTELIN) 0.1 % nasal spray Place 1 spray into both nostrils 2 (two) times daily as needed for rhinitis. Use in each nostril as directed (Patient not taking: Reported on 05/19/2023)     dexlansoprazole (DEXILANT) 60 MG capsule Take 1 capsule (60 mg total) by mouth daily. 90 capsule 3   montelukast (SINGULAIR) 10 MG tablet TAKE 1 TABLET BY MOUTH EVERYDAY AT BEDTIME (Patient not taking: Reported on 05/19/2023) 90 tablet 1   Current Facility-Administered Medications  Medication Dose Route Frequency Provider Last Rate Last Admin   methylPREDNISolone acetate (DEPO-MEDROL) injection 40 mg  40 mg Intra-articular Once Darreld Mclean, MD        Past Medical History:  Diagnosis Date   Anxiety disorder    Carotid artery stenosis    Chronic pelvic pain in female    Coronary artery disease    Endometriosis    GERD (gastroesophageal reflux disease)    Helicobacter pylori gastritis    Dr. Juanda Chance    Hiatal hernia 2003   on UGI    Hypothyroidism 2009   IBS (irritable bowel syndrome) 2003   Obesity (BMI 30-39.9) DEC 2011 145 LBS   Osteoporosis    PONV (postoperative  nausea and vomiting)     Past Surgical History:  Procedure Laterality Date   ABDOMINAL HYSTERECTOMY     adhesiolysis     dense adhesion between rectum and sigmoid,  & pelvis    BIOPSY  05/20/2022   Procedure: BIOPSY;  Surgeon: Lanelle Bal, DO;  [facility];  Service: Endoscopy;;   BRAVO PH STUDY  01/19/2012   Procedure: BRAVO PH STUDY;  Surgeon: West Bali, MD;  Location: [facility];  Service: Endoscopy;;   CATARACT EXTRACTION W/PHACO Left 06/03/2019   Procedure: CATARACT EXTRACTION PHACO AND INTRAOCULAR LENS PLACEMENT LEFT EYE (CDE: 2.92);  Surgeon: Fabio Pierce, MD;  Location: [facility];  Service: Ophthalmology;  Laterality: Left;   COLONOSCOPY  03: [facility]/D & 08: SCREENING   WNL'S   COLONOSCOPY  07/28/2011   sessile polyp in ascending colon/internal hemorrhoids. Next colonoscopy January 2018   COLONOSCOPY N/A 08/01/2016   Procedure: COLONOSCOPY;  Surgeon: West Bali, MD;  [facility]: [facility];  Service: Endoscopy;  Laterality: N/A;  930    COLONOSCOPY WITH PROPOFOL N/A 12/24/2021   Procedure: COLONOSCOPY WITH  PROPOFOL;  Surgeon: Lanelle Bal, DO;  [facility];  Service: Endoscopy;  Laterality: N/A;  9:00am   ESOPHAGOGASTRODUODENOSCOPY  01/19/2012   SLF: SMALL Hiatal hernia/Mild gastritis   ESOPHAGOGASTRODUODENOSCOPY (EGD) WITH PROPOFOL N/A 05/20/2022   Procedure: ESOPHAGOGASTRODUODENOSCOPY (EGD) WITH PROPOFOL;  Surgeon: Lanelle Bal, DO;  [facility];  Service: Endoscopy;  Laterality: N/A;  12:45 pm, pt knows to arrive at 7:00   EYE SURGERY Right 10/18/2013   catarat extraction   LAPAROSCOPY     OLECRANON BURSECTOMY Left 07/01/2018   Procedure: LEFT ELBOW OLECRANON BURSECTOMY AND SPUR EXCISION;  Surgeon: Vickki Hearing, MD;  Location: [facility];  Service: Orthopedics;  Laterality: Left;   POLYPECTOMY  12/24/2021   Procedure: POLYPECTOMY INTESTINAL;  Surgeon: Lanelle Bal, DO;  [facility];  Service: Endoscopy;;    rt foot sx Bilateral    s/p hysterectomy     SALPINGOOPHORECTOMY     UPPER GASTROINTESTINAL ENDOSCOPY  DEC 2010: DYSPEPSIA/DYSPHAGIA   SCH RING/ESO DIL 16 MM, GASTRITIS/DUODENITIS 2o to Aleve/on OMP PRN    Family History  Problem Relation Age of Onset   Prostate cancer Father    Colon cancer Father        < 63 YO   Cancer Father        prostate and colon   Pancreatic cancer Mother    Cancer Mother        pancreas   Diabetes Mother    Diabetes Brother    Cancer Brother        prostate    Diabetes Sister        prediabetic   Heart disease Brother        CHF   Thyroid disease Neg Hx     Allergies as of 05/19/2023 - Review Complete 05/19/2023  Allergen Reaction Noted   Aspirin Other (See Comments) 05/09/2014   Diazepam Other (See Comments)    Prednisone Other (See Comments) 02/21/2020    Social History   Socioeconomic History   Marital status: Divorced    Spouse name: Not on file   Number of children: Not on file   Years of education: Not on file   Highest education level: Not on file  Occupational History   Occupation: fulltime at [employer]   Tobacco Use   Smoking status: Never   Smokeless tobacco: Never   Tobacco comments:    Never smoked  Vaping Use   Vaping status: Never Used  Substance and Sexual Activity   Alcohol use: Not Currently    Comment: occasional, maybe once/year   Drug use: No   Sexual activity: Not Currently    Birth control/protection: None  Other Topics Concern   Not on file  Social History Narrative   Not on file   Social Determinants of Health   Financial Resource Strain: Low Risk  (03/25/2022)   Overall Financial Resource Strain (CARDIA)    Difficulty of Paying Living Expenses: Not hard at all  Food Insecurity: No Food Insecurity (03/25/2022)   Hunger Vital Sign    Worried About Running Out of Food in the Last Year: Never true    Ran Out of Food in the Last Year: Never true  Transportation Needs: No  Transportation Needs (03/25/2022)   PRAPARE - Administrator, Civil Service (Medical): No    Lack of Transportation (Non-Medical): No  Physical Activity: Insufficiently Active (03/25/2022)   Exercise Vital Sign    Days  of Exercise per Week: 3 days    Minutes of Exercise per Session: 30 min  Stress: No Stress Concern Present (03/25/2022)   [employer] of Occupational Health - Occupational Stress Questionnaire    Feeling of Stress : Not at all  Social Connections: Moderately Integrated (03/25/2022)   Social Connection and Isolation Panel [NHANES]    Frequency of Communication with Friends and Family: Three times a week    Frequency of Social Gatherings with Friends and Family: More than three times a week    Attends Religious Services: More than 4 times per year    Active Member of [employer] or Organizations: Yes    Attends Engineer, structural: More than 4 times per year    Marital Status: Divorced     Review of Systems   Gen: Denies fever, chills, anorexia. Denies fatigue, weakness, weight loss.  CV: Denies chest pain, palpitations, syncope, peripheral edema, and claudication. Resp: Denies dyspnea at rest, cough, wheezing, coughing up blood, and pleurisy. GI: See HPI Derm: Denies rash, itching, dry skin Psych: Denies depression, anxiety, memory loss, confusion. No homicidal or suicidal ideation.  Heme: Denies bruising, bleeding, and enlarged lymph nodes.  Physical Exam   BP 126/86 (BP Location: Right Arm, Patient Position: Sitting, Cuff Size: Normal)   Pulse 73   Temp 97.7 F (36.5 C) (Temporal)   Ht 4\' 10"  (1.473 m)   Wt 157 lb 3.2 oz (71.3 kg)   BMI 32.85 kg/m   General:   Alert and oriented. No distress noted. Pleasant and cooperative.  Head:  Normocephalic and atraumatic. Eyes:  Conjuctiva clear without scleral icterus. Mouth:  Oral mucosa pink and moist. Good dentition. No lesions. Lungs:  Clear to auscultation bilaterally. No wheezes, rales, or  rhonchi. No distress.  Heart:  S1, S2 present without murmurs appreciated.  Abdomen:  +BS, soft, non-tender and non-distended. No rebound or guarding. No HSM or masses noted. Rectal: deferred Msk:  Symmetrical without gross deformities. Normal posture. Extremities:  Without edema. Neurologic:  Alert and  oriented x4 Psych:  Alert and cooperative. Normal mood and affect.  Assessment  Kim Grimes is a 72 y.o. female with a history of . pylori gastritis, GERD, constipation, hypothyroidism, CAD, anxiety, and hiatal hernia presenting today for follow-up.  Constipation: Well-controlled with MiraLAX daily as well as align probiotic.  GERD: Continues to have issues with frequent burping and " blowing bubbles", especially in the evening/early morning hours.  Has been continuing to take lansoprazole since her upper endoscopy despite having Dexilant sent in given she has such a large supply left.  We discussed resuming Dexilant as she has tolerated this well in the past.  I resent a prescription today and advised her to stop taking lansoprazole daily and start taking the Dexilant.  Also discussed with her taking a dose of Gas-X (simethicone) once nightly prior to bed to see if this also helps her with frequent gas production.  PLAN   Resume Dexilant 60 mg once daily. Refilled today. Stop lansoprazole. Continue MiraLAX and align probiotic GERD diet Follow-up in 3 months    Brooke Bonito, MSN, FNP-BC, AGACNP-BC [facility]

## 2023-05-19 ENCOUNTER — Encounter: Payer: Self-pay | Admitting: Gastroenterology

## 2023-05-19 ENCOUNTER — Ambulatory Visit: Payer: PPO | Admitting: Gastroenterology

## 2023-05-19 VITALS — BP 126/86 | HR 73 | Temp 97.7°F | Ht <= 58 in | Wt 157.2 lb

## 2023-05-19 DIAGNOSIS — R142 Eructation: Secondary | ICD-10-CM

## 2023-05-19 DIAGNOSIS — K5900 Constipation, unspecified: Secondary | ICD-10-CM | POA: Diagnosis not present

## 2023-05-19 DIAGNOSIS — K219 Gastro-esophageal reflux disease without esophagitis: Secondary | ICD-10-CM

## 2023-05-19 DIAGNOSIS — K5904 Chronic idiopathic constipation: Secondary | ICD-10-CM

## 2023-05-19 MED ORDER — DEXLANSOPRAZOLE 60 MG PO CPDR: 60.0000 mg | DELAYED_RELEASE_CAPSULE | Freq: Every day | ORAL | 3 refills | Status: DC

## 2023-05-19 NOTE — Patient Instructions (Addendum)
I have sent in Dexilant for you, please go back to trying this instead of the omeprazole to see if you have better improvement of your reflux in regards to the " blowing bubbles".   Follow a GERD diet:  Avoid fried, fatty, greasy, spicy, citrus foods. Avoid caffeine and carbonated beverages. Avoid chocolate. Try eating 4-6 small meals a day rather than 3 large meals. Do not eat within 3 hours of laying down. Prop head of bed up on wood or bricks to create a 6 inch incline.   I would recommend that you try some over-the-counter Gas-X once nightly prior to bed to see if this also helps with your symptoms.  Any of these below are good options.    Continue taking MiraLAX once daily for your constipation.  Follow up in 3 months.   It was a pleasure to see you today. I want to create trusting relationships with patients. If you receive a survey regarding your visit,  I greatly appreciate you taking time to fill this out on paper or through your MyChart. I value your feedback.  Brooke Bonito, MSN, FNP-BC, AGACNP-BC [facility]

## 2023-05-20 DIAGNOSIS — M25562 Pain in left knee: Secondary | ICD-10-CM | POA: Diagnosis not present

## 2023-05-20 DIAGNOSIS — M25662 Stiffness of left knee, not elsewhere classified: Secondary | ICD-10-CM | POA: Diagnosis not present

## 2023-05-20 DIAGNOSIS — M1712 Unilateral primary osteoarthritis, left knee: Secondary | ICD-10-CM | POA: Diagnosis not present

## 2023-05-20 DIAGNOSIS — R262 Difficulty in walking, not elsewhere classified: Secondary | ICD-10-CM | POA: Diagnosis not present

## 2023-05-27 ENCOUNTER — Ambulatory Visit: Payer: PPO

## 2023-05-27 VITALS — Ht <= 58 in | Wt 157.0 lb

## 2023-05-27 DIAGNOSIS — Z0000 Encounter for general adult medical examination without abnormal findings: Secondary | ICD-10-CM

## 2023-05-27 NOTE — Progress Notes (Signed)
Subjective:   Kim Grimes is a 72 y.o. female who presents for Medicare Annual (Subsequent) preventive examination.  Visit Complete: Virtual I connected with  Kim Grimes on 05/27/23 by a audio enabled telemedicine application and verified that I am speaking with the correct person using two identifiers.  Patient Location: Home  Provider Location: Home Office  I discussed the limitations of evaluation and management by telemedicine. The patient expressed understanding and agreed to proceed.  Vital Signs: Because this visit was a virtual/telehealth visit, some criteria may be missing or patient reported. Any vitals not documented were not able to be obtained and vitals that have been documented are patient reported.  Cardiac Risk Factors include: advanced age (>73men, >45 women)     Objective:    Today's Vitals   05/27/23 1645  Weight: 157 lb (71.2 kg)  Height: 4\' 10"  (1.473 m)   Body mass index is 32.81 kg/m.     05/27/2023    4:52 PM 05/20/2022    7:09 AM 03/25/2022    3:28 PM 12/24/2021    7:29 AM 08/30/2021   11:07 AM 02/21/2021    4:07 PM 05/31/2019    1:10 PM  Advanced Directives  Does Patient Have a Medical Advance Directive? No Yes Yes Yes No Yes No  Type of Special educational needs teacher of [place];Living will Living will;Healthcare Power of [employer] Power of Attorney  Living will;Healthcare Power of Attorney   Copy of Healthcare Power of Attorney in Chart?  No - copy requested No - copy requested      Would patient like information on creating a medical advance directive? Yes (MAU/Ambulatory/Procedural Areas - Information given)    No - Patient declined  No - Patient declined    Current Medications (verified) Outpatient Encounter Medications as of 05/27/2023  Medication Sig   dexlansoprazole (DEXILANT) 60 MG capsule Take 1 capsule (60 mg total) by mouth daily.   levothyroxine (SYNTHROID) 50 MCG tablet TAKE 1 TABLET BY MOUTH EVERY  DAY BEFORE BREAKFAST (Patient taking differently: 50 mcg alternates with 75 mcg every other day.)   levothyroxine (SYNTHROID) 75 MCG tablet TAKE 1 TABLET BY MOUTH EVERY DAY   azelastine (ASTELIN) 0.1 % nasal spray Place 1 spray into both nostrils 2 (two) times daily as needed for rhinitis. Use in each nostril as directed (Patient not taking: Reported on 05/19/2023)   montelukast (SINGULAIR) 10 MG tablet TAKE 1 TABLET BY MOUTH EVERYDAY AT BEDTIME (Patient not taking: Reported on 05/19/2023)   Facility-Administered Encounter Medications as of 05/27/2023  Medication   methylPREDNISolone acetate (DEPO-MEDROL) injection 40 mg    Allergies (verified) Aspirin, Diazepam, and Prednisone   History: Past Medical History:  Diagnosis Date   Anxiety disorder    Carotid artery stenosis    Chronic pelvic pain in female    Coronary artery disease    Endometriosis    GERD (gastroesophageal reflux disease)    Helicobacter pylori gastritis    Dr. Juanda Chance    Hiatal hernia 2003   on UGI    Hypothyroidism 2009   IBS (irritable bowel syndrome) 2003   Obesity (BMI 30-39.9) DEC 2011 145 LBS   Osteoporosis    PONV (postoperative nausea and vomiting)    Past Surgical History:  Procedure Laterality Date   ABDOMINAL HYSTERECTOMY     adhesiolysis     dense adhesion between rectum and sigmoid,  & pelvis    BIOPSY  05/20/2022   Procedure: BIOPSY;  Surgeon: Marletta Lor,  Hennie Duos, DO;  [facility];  Service: Endoscopy;;   BRAVO PH STUDY  01/19/2012   Procedure: BRAVO PH STUDY;  Surgeon: West Bali, MD;  [facility];  Service: Endoscopy;;   CATARACT EXTRACTION W/PHACO Left 06/03/2019   Procedure: CATARACT EXTRACTION PHACO AND INTRAOCULAR LENS PLACEMENT LEFT EYE (CDE: 2.92);  Surgeon: Fabio Pierce, MD;  Location: [facility];  Service: Ophthalmology;  Laterality: Left;   COLONOSCOPY  03: [facility]/D & 08: SCREENING   WNL'S   COLONOSCOPY  07/28/2011   sessile polyp in ascending colon/internal  hemorrhoids. Next colonoscopy January 2018   COLONOSCOPY N/A 08/01/2016   Procedure: COLONOSCOPY;  Surgeon: West Bali, MD;  [facility];  Service: Endoscopy;  Laterality: N/A;  930    COLONOSCOPY WITH PROPOFOL N/A 12/24/2021   Procedure: COLONOSCOPY WITH PROPOFOL;  Surgeon: Lanelle Bal, DO;  [facility];  Service: Endoscopy;  Laterality: N/A;  9:00am   ESOPHAGOGASTRODUODENOSCOPY  01/19/2012   SLF: SMALL Hiatal hernia/Mild gastritis   ESOPHAGOGASTRODUODENOSCOPY (EGD) WITH PROPOFOL N/A 05/20/2022   Procedure: ESOPHAGOGASTRODUODENOSCOPY (EGD) WITH PROPOFOL;  Surgeon: Lanelle Bal, DO;  [facility];  Service: Endoscopy;  Laterality: N/A;  12:45 pm, pt knows to arrive at 7:00   EYE SURGERY Right 10/18/2013   catarat extraction   LAPAROSCOPY     OLECRANON BURSECTOMY Left 07/01/2018   Procedure: LEFT ELBOW OLECRANON BURSECTOMY AND SPUR EXCISION;  Surgeon: Vickki Hearing, MD;  Location: [facility];  Service: Orthopedics;  Laterality: Left;   POLYPECTOMY  12/24/2021   Procedure: POLYPECTOMY INTESTINAL;  Surgeon: Lanelle Bal, DO;  [facility];  Service: Endoscopy;;   rt foot sx Bilateral    s/p hysterectomy     SALPINGOOPHORECTOMY     UPPER GASTROINTESTINAL ENDOSCOPY  DEC 2010: DYSPEPSIA/DYSPHAGIA   SCH RING/ESO DIL 16 MM, GASTRITIS/DUODENITIS 2o to Aleve/on OMP PRN   Family History  Problem Relation Age of Onset   Prostate cancer Father    Colon cancer Father        < 66 YO   Cancer Father        prostate and colon   Pancreatic cancer Mother    Cancer Mother        pancreas   Diabetes Mother    Diabetes Brother    Cancer Brother        prostate    Diabetes Sister        prediabetic   Heart disease Brother        CHF   Thyroid disease Neg Hx    Social History   Socioeconomic History   Marital status: Divorced    Spouse name: Not on file   Number of children: Not on file   Years of education: Not on file   Highest  education level: Not on file  Occupational History   Occupation: fulltime at [employer]   Tobacco Use   Smoking status: Never   Smokeless tobacco: Never   Tobacco comments:    Never smoked  Vaping Use   Vaping status: Never Used  Substance and Sexual Activity   Alcohol use: Not Currently    Comment: occasional, maybe once/year   Drug use: No   Sexual activity: Not Currently    Birth control/protection: None  Other Topics Concern   Not on file  Social History Narrative   Not on file   Social Determinants of Health   Financial Resource Strain:  Low Risk  (05/27/2023)   Overall Financial Resource Strain (CARDIA)    Difficulty of Paying Living Expenses: Not hard at all  Food Insecurity: No Food Insecurity (05/27/2023)   Hunger Vital Sign    Worried About Running Out of Food in the Last Year: Never true    Ran Out of Food in the Last Year: Never true  Transportation Needs: No Transportation Needs (05/27/2023)   PRAPARE - Administrator, Civil Service (Medical): No    Lack of Transportation (Non-Medical): No  Physical Activity: Sufficiently Active (05/27/2023)   Exercise Vital Sign    Days of Exercise per Week: 5 days    Minutes of Exercise per Session: 30 min  Stress: No Stress Concern Present (05/27/2023)   [employer] of Occupational Health - Occupational Stress Questionnaire    Feeling of Stress : Not at all  Social Connections: Moderately Integrated (05/27/2023)   Social Connection and Isolation Panel [NHANES]    Frequency of Communication with Friends and Family: More than three times a week    Frequency of Social Gatherings with Friends and Family: Three times a week    Attends Religious Services: More than 4 times per year    Active Member of Clubs or Organizations: Yes    Attends Engineer, structural: More than 4 times per year    Marital Status: Divorced    Tobacco Counseling Counseling given: Not Answered Tobacco  comments: Never smoked   Clinical Intake:  Pre-visit preparation completed: Yes  Pain : No/denies pain     Diabetes: No  How often do you need to have someone help you when you read instructions, pamphlets, or other written materials from your doctor or pharmacy?: 1 - Never  Interpreter Needed?: No  Information entered by :: Kandis Fantasia LPN   Activities of Daily Living    05/27/2023    4:51 PM  In your present state of health, do you have any difficulty performing the following activities:  Hearing? 0  Vision? 0  Difficulty concentrating or making decisions? 0  Walking or climbing stairs? 0  Dressing or bathing? 0  Doing errands, shopping? 0  Preparing Food and eating ? N  Using the Toilet? N  In the past six months, have you accidently leaked urine? N  Do you have problems with loss of bowel control? N  Managing your Medications? N  Managing your Finances? N  Housekeeping or managing your Housekeeping? N    Patient Care Team: Kerri Perches, MD as PCP - General (Family Medicine) West Bali, MD (Inactive) (Gastroenterology) Mammography, [facility] (Diagnostic Radiology) Vickki Hearing, MD as Consulting Physician (Orthopedic Surgery) Aida Raider, NP as Nurse Practitioner (Gastroenterology) Maxie Better, MD as Consulting Physician (Obstetrics and Gynecology)  Indicate any recent Medical Services you may have received from other than [facility] providers in the past year (date may be approximate).     Assessment:   This is a routine wellness examination for [place].  Hearing/Vision screen Hearing Screening - Comments:: Denies hearing difficulties   Vision Screening - Comments:: No vision problems; will schedule routine eye exam soon      Goals Addressed             This Visit's Progress    Remain active and independent        Depression Screen    05/27/2023    4:50 PM 02/13/2023    3:15 PM 07/30/2022    3:39 PM 06/25/2022  8:12 AM 03/25/2022    3:28 PM 10/24/2021    8:09 AM 09/03/2021    9:22 AM  PHQ 2/9 Scores  PHQ - 2 Score 0 0 0 0 0 0 0  PHQ- 9 Score  0         Fall Risk    05/27/2023    4:51 PM 02/13/2023    3:15 PM 07/30/2022    3:39 PM 06/25/2022    8:12 AM 03/25/2022    3:28 PM  Fall Risk   Falls in the past year? 0 0 0 1 0  Number falls in past yr: 0 0 0 0 0  Injury with Fall? 0 0 0 0 0  Risk for fall due to : No Fall Risks No Fall Risks No Fall Risks No Fall Risks   Follow up Falls prevention discussed;Education provided;Falls evaluation completed Falls evaluation completed Falls evaluation completed Falls evaluation completed Falls evaluation completed;Education provided;Falls prevention discussed    MEDICARE RISK AT HOME: Medicare Risk at Home Any stairs in or around the home?: No If so, are there any without handrails?: No Home free of loose throw rugs in walkways, pet beds, electrical cords, etc?: Yes Adequate lighting in your home to reduce risk of falls?: Yes Life alert?: No Use of a cane, walker or w/c?: No Grab bars in the bathroom?: Yes Shower chair or bench in shower?: No Elevated toilet seat or a handicapped toilet?: Yes  TIMED UP AND GO:  Was the test performed?  No    Cognitive Function:        05/27/2023    4:52 PM 03/25/2022    3:32 PM 02/21/2021    4:10 PM 02/21/2020    8:33 AM 02/17/2019    8:14 AM  6CIT Screen  What Year? 0 points 0 points 0 points  0 points  What month? 0 points 0 points 0 points  0 points  What time? 0 points 0 points 0 points 0 points 0 points  Count back from 20 0 points 0 points 0 points 0 points 0 points  Months in reverse 0 points 0 points 0 points 0 points 0 points  Repeat phrase 0 points 0 points 4 points 0 points 0 points  Total Score 0 points 0 points 4 points  0 points    Immunizations Immunization History  Administered Date(s) Administered   Fluad Quad(high Dose 65+) 02/28/2019, 03/13/2020, 02/25/2021, 04/30/2022   Fluad  Trivalent(High Dose 65+) 04/17/2023   Influenza, High Dose Seasonal PF 05/08/2017   Influenza,inj,Quad PF,6+ Mos 04/13/2013, 05/09/2014, 03/23/2015, 02/16/2018   Influenza-Unspecified 04/19/2023   Moderna Covid-19 Fall Seasonal Vaccine 29yrs & older 05/03/2022, 04/17/2023   Moderna Covid-19 Vaccine Bivalent Booster 28yrs & up 04/07/2021   Moderna Sars-Covid-2 Vaccination 08/13/2019, 09/10/2019, 04/21/2020, 02/04/2021, 05/17/2022   Pneumococcal Conjugate-13 03/15/2018   Pneumococcal Polysaccharide-23 03/22/2019   Respiratory Syncytial Virus Vaccine,Recomb Aduvanted(Arexvy) 07/17/2022   Tdap 09/18/2011, 05/01/2022   Zoster Recombinant(Shingrix) 07/25/2020, 04/07/2021   Zoster, Live 09/18/2011    TDAP status: Up to date  Flu Vaccine status: Up to date  Pneumococcal vaccine status: Up to date  Covid-19 vaccine status: Information provided on how to obtain vaccines.   Qualifies for Shingles Vaccine? Yes   Zostavax completed Yes   Shingrix Completed?: Yes  Screening Tests Health Maintenance  Topic Date Due   MAMMOGRAM  02/21/2024   Medicare Annual Wellness (AWV)  05/26/2024   Colonoscopy  12/25/2031   DTaP/Tdap/Td (3 - Td or Tdap) 05/01/2032  Pneumonia Vaccine 85+ Years old  Completed   INFLUENZA VACCINE  Completed   DEXA SCAN  Completed   COVID-19 Vaccine  Completed   Hepatitis C Screening  Completed   Zoster Vaccines- Shingrix  Completed   HPV VACCINES  Aged Out    Health Maintenance  There are no preventive care reminders to display for this patient.   Colorectal cancer screening: Type of screening: Colonoscopy. Completed 12/24/21. Repeat every 10 years  Mammogram status: Completed 2024. Repeat every year (records requested from [facility])   Bone Density status: Completed 03/06/23. Results reflect: Bone density results: OSTEOPENIA. Repeat every 2 years.  Lung Cancer Screening: (Low Dose CT Chest recommended if Age 86-80 years, 20 pack-year currently smoking OR have quit  w/in 15years.) does not qualify.   Lung Cancer Screening Referral: n/a  Additional Screening:  Hepatitis C Screening: does qualify; Completed 07/03/22  Vision Screening: Recommended annual ophthalmology exams for early detection of glaucoma and other disorders of the eye. Is the patient up to date with their annual eye exam?  No  Who is the provider or what is the name of the office in which the patient attends annual eye exams? none If pt is not established with a provider, would they like to be referred to a provider to establish care? No .   Dental Screening: Recommended annual dental exams for proper oral hygiene  Community Resource Referral / Chronic Care Management: CRR required this visit?  No   CCM required this visit?  No     Plan:     I have personally reviewed and noted the following in the patient's chart:   Medical and social history Use of alcohol, tobacco or illicit drugs  Current medications and supplements including opioid prescriptions. Patient is not currently taking opioid prescriptions. Functional ability and status Nutritional status Physical activity Advanced directives List of other physicians Hospitalizations, surgeries, and ER visits in previous 12 months Vitals Screenings to include cognitive, depression, and falls Referrals and appointments  In addition, I have reviewed and discussed with patient certain preventive protocols, quality metrics, and best practice recommendations. A written personalized care plan for preventive services as well as general preventive health recommendations were provided to patient.     Kandis Fantasia [id number], [place]   [id number]/2440   After Visit Summary: (MyChart) Due to this being a telephonic visit, the after visit summary with patients personalized plan was offered to patient via MyChart   Nurse Notes: Patient states that she is having intermittent left arm pain that is an aching pain for a few months

## 2023-05-27 NOTE — Patient Instructions (Signed)
Kim Grimes , Thank you for taking time to come for your Medicare Wellness Visit. I appreciate your ongoing commitment to your health goals. Please review the following plan we discussed and let me know if I can assist you in the future.   Referrals/Orders/Follow-Ups/Clinician Recommendations: Aim for 30 minutes of exercise or brisk walking, 6-8 glasses of water, and 5 servings of fruits and vegetables each day.  This is a list of the screening recommended for you and due dates:  Health Maintenance  Topic Date Due   Mammogram  02/21/2024   Medicare Annual Wellness Visit  05/26/2024   Colon Cancer Screening  12/25/2031   DTaP/Tdap/Td vaccine (3 - Td or Tdap) 05/01/2032   Pneumonia Vaccine  Completed   Flu Shot  Completed   DEXA scan (bone density measurement)  Completed   COVID-19 Vaccine  Completed   Hepatitis C Screening  Completed   Zoster (Shingles) Vaccine  Completed   HPV Vaccine  Aged Out    Advanced directives: (ACP Link)Information on Advanced Care Planning can be found at [facility] Advance Health Care Directives Advance Health Care Directives ([email or web address]   Next Medicare Annual Wellness Visit scheduled for next year: Yes

## 2023-06-03 DIAGNOSIS — R262 Difficulty in walking, not elsewhere classified: Secondary | ICD-10-CM | POA: Diagnosis not present

## 2023-06-03 DIAGNOSIS — M25662 Stiffness of left knee, not elsewhere classified: Secondary | ICD-10-CM | POA: Diagnosis not present

## 2023-06-03 DIAGNOSIS — M1712 Unilateral primary osteoarthritis, left knee: Secondary | ICD-10-CM | POA: Diagnosis not present

## 2023-06-03 DIAGNOSIS — M25562 Pain in left knee: Secondary | ICD-10-CM | POA: Diagnosis not present

## 2023-06-24 IMAGING — MR MR HEAD W/O CM
10 series · 48 of 48 positions shown · non-contrast
Comparison: MRI November 19, 2010.

CLINICAL DATA: Mental status change, unknown cause

EXAM:
MRI HEAD WITHOUT CONTRAST
TECHNIQUE: Multiplanar, multiecho pulse sequences of the brain and surrounding
structures were obtained without intravenous contrast.

[Series 2: T1 · sagittal · 5.0mm · 0.45mm/px · 3 of 25 slices shown]
[im 1/25]
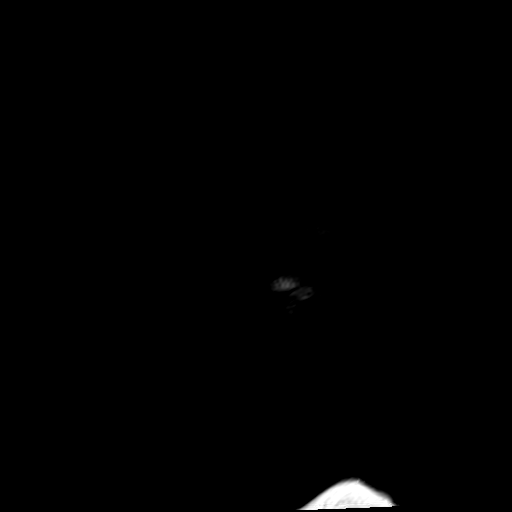
[im 13/25]
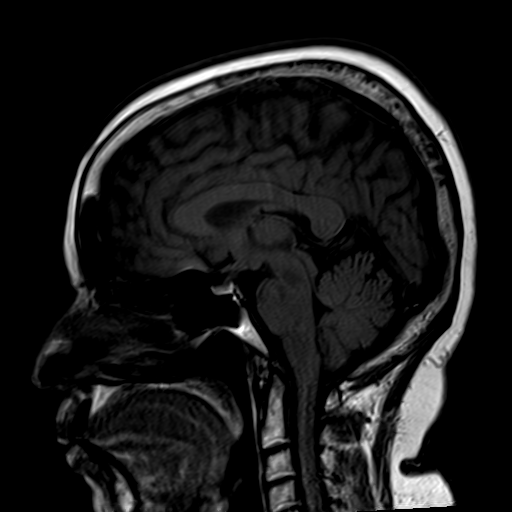
[im 25/25]
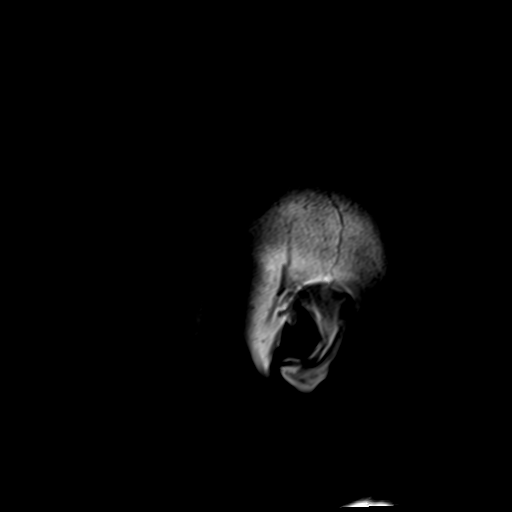

[Series 3: ax ep2d_diff_3 · axial · 3.0mm · 1.80mm/px · z∈[-50,+111]mm · 8 of 107 slices shown]
[im 1/107]
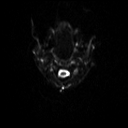
[im 16/107]
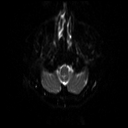
[im 31/107]
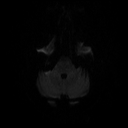
[im 46/107]
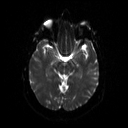
[im 61/107]
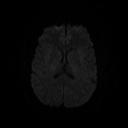
[im 76/107]
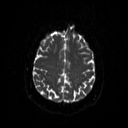
[im 91/107]
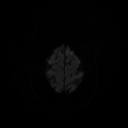
[im 107/107]
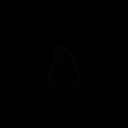

[Series 4: ax ep2d_diff_3_adc · axial · 3.0mm · 1.80mm/px · z∈[-50,+111]mm · 4 of 55 slices shown]
[im 1/55]
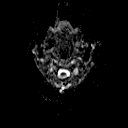
[im 19/55]
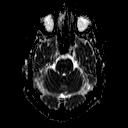
[im 37/55]
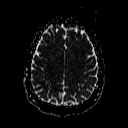
[im 55/55]
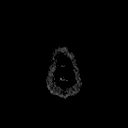

[Series 5: cor ep2d_diff · coronal · 5.0mm · 1.77mm/px · 5 of 59 slices shown]
[im 1/59]
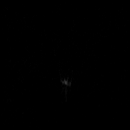
[im 15/59]
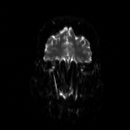
[im 30/59]
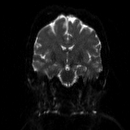
[im 44/59]
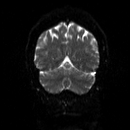
[im 59/59]
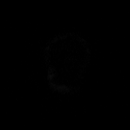

[Series 6: cor ep2d_diff_adc · coronal · 5.0mm · 1.77mm/px · 2 of 30 slices shown]
[im 1/30]
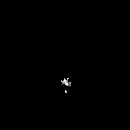
[im 30/30]
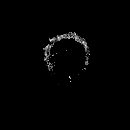

[Series 8: swi_images · axial · 2.0mm · 0.98mm/px · z∈[-49,+108]mm · 6 of 80 slices shown]
[im 1/80]
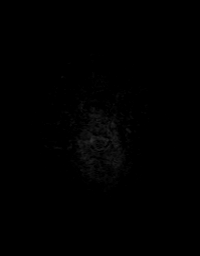
[im 16/80]
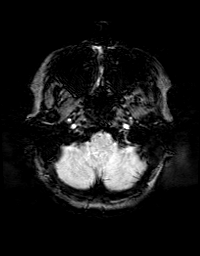
[im 32/80]
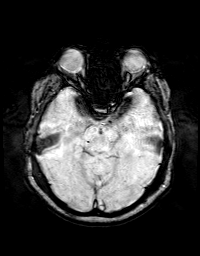
[im 48/80]
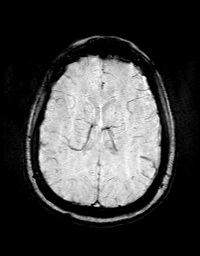
[im 64/80]
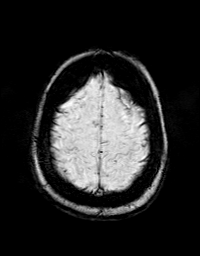
[im 80/80]
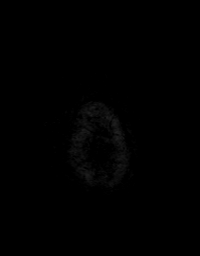

[Series 9: FLAIR · axial · 3.0mm · 0.43mm/px · z∈[-45,+106]mm · 3 of 40 slices shown]
[im 1/40]
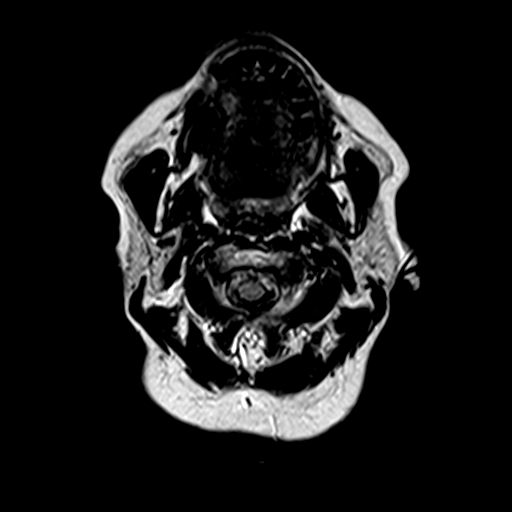
[im 20/40]
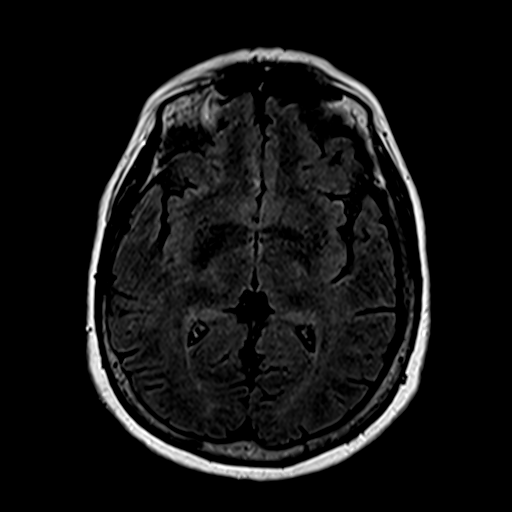
[im 40/40]
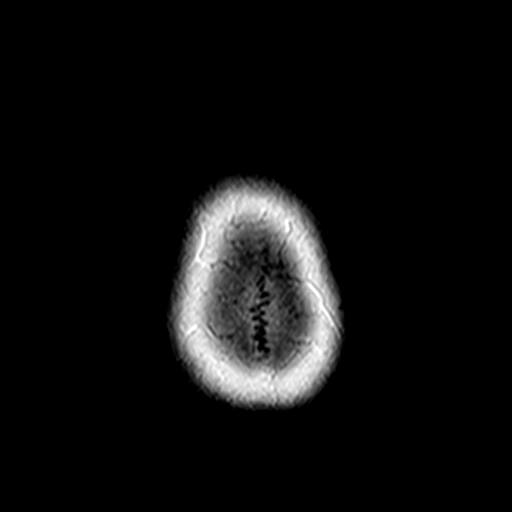

[Series 10: T2 · axial · 5.0mm · 0.65mm/px · z∈[-54,+113]mm · 2 of 29 slices shown (1 of 2)]
[im 1/29]
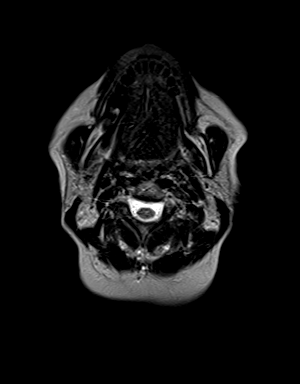
[im 29/29]
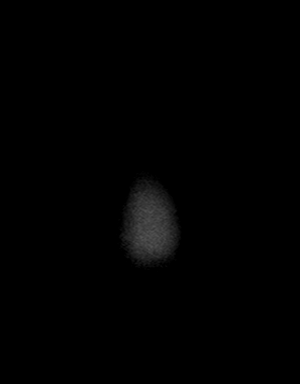

[Series 11: t1_mpr_tra · axial · 1.0mm · 0.72mm/px · z∈[-49,+109]mm · 13 of 160 slices shown]
[im 1/160]
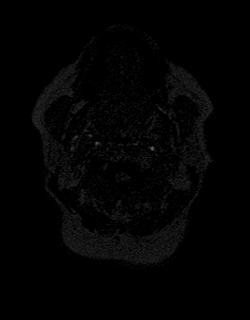
[im 14/160]
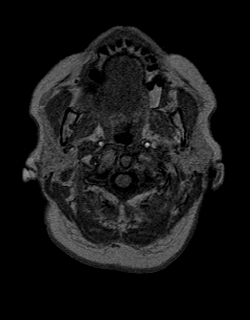
[im 27/160]
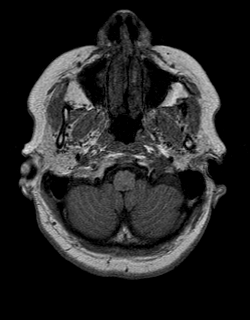
[im 40/160]
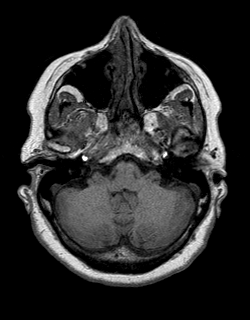
[im 54/160]
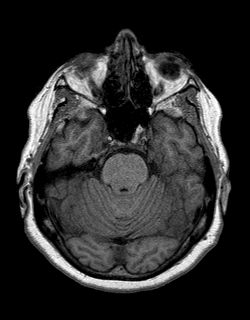
[im 67/160]
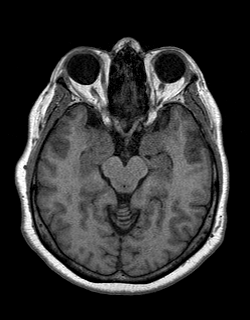
[im 80/160]
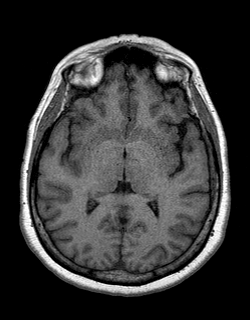
[im 93/160]
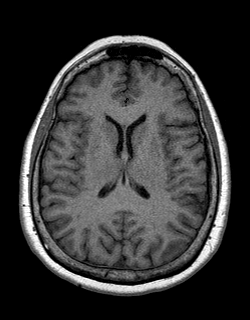
[im 107/160]
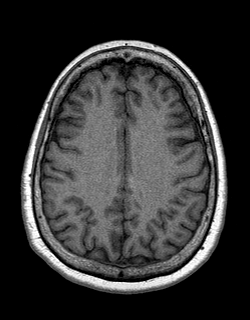
[im 120/160]
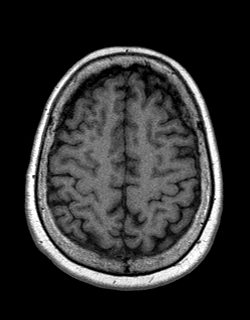
[im 133/160]
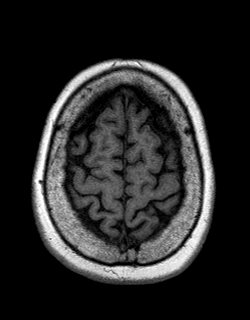
[im 146/160]
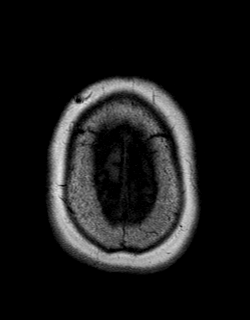
[im 160/160]
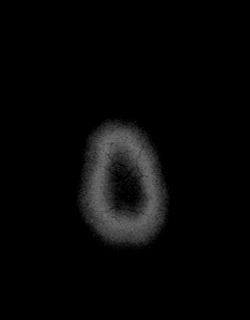

[Series 12: T2 · coronal · 5.0mm · 0.43mm/px · 2 of 28 slices shown (2 of 2)]
[im 1/28]
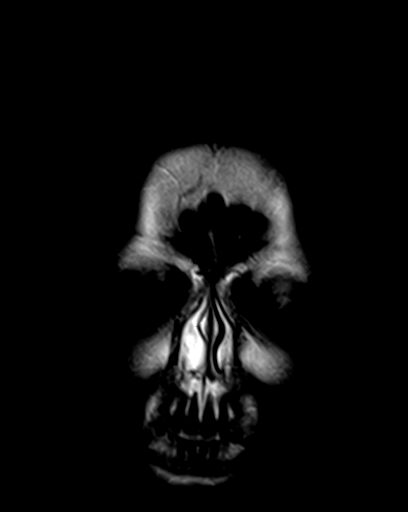
[im 28/28]
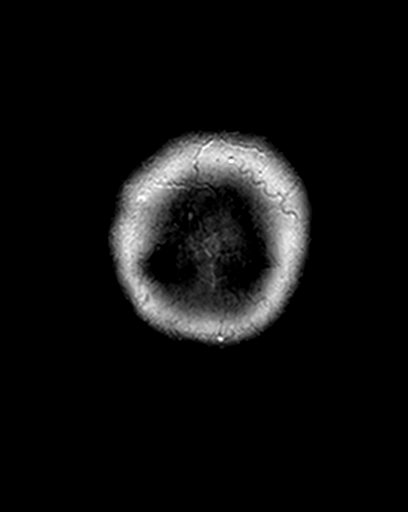

[48 of 48 positions shown; findings below may reference images not displayed]

FINDINGS: Brain: No acute infarction, hemorrhage, hydrocephalus, extra-axial
collection or mass lesion.

Vascular: Major arterial flow voids are maintained at the skull
base.

Skull and upper cervical spine: Normal marrow signal.

Sinuses/Orbits: Mild ethmoid air cell mucosal thickening. Small
retention cysts in the inferior right maxillary sinus. Otherwise,
clear sinuses. No acute orbital findings.

Other: No mastoid effusions.
IMPRESSION: Normal brain MRI.  No evidence of acute intracranial abnormality.

## 2023-06-25 ENCOUNTER — Other Ambulatory Visit: Payer: PPO

## 2023-06-26 ENCOUNTER — Other Ambulatory Visit: Payer: PPO

## 2023-06-26 ENCOUNTER — Other Ambulatory Visit: Payer: Self-pay

## 2023-06-26 ENCOUNTER — Telehealth: Payer: Self-pay

## 2023-06-26 DIAGNOSIS — E039 Hypothyroidism, unspecified: Secondary | ICD-10-CM

## 2023-06-26 NOTE — Telephone Encounter (Signed)
Orders Placed This Encounter   Procedures   • T4, free   • TSH

## 2023-06-27 LAB — T4, FREE: Free T4: 1.4 ng/dL (ref 0.8–1.8)

## 2023-06-27 LAB — TSH: TSH: 1.52 m[IU]/L (ref 0.40–4.50)

## 2023-06-29 DIAGNOSIS — H01001 Unspecified blepharitis right upper eyelid: Secondary | ICD-10-CM | POA: Diagnosis not present

## 2023-06-29 DIAGNOSIS — H01005 Unspecified blepharitis left lower eyelid: Secondary | ICD-10-CM | POA: Diagnosis not present

## 2023-06-29 DIAGNOSIS — H401131 Primary open-angle glaucoma, bilateral, mild stage: Secondary | ICD-10-CM | POA: Diagnosis not present

## 2023-06-29 DIAGNOSIS — H02834 Dermatochalasis of left upper eyelid: Secondary | ICD-10-CM | POA: Diagnosis not present

## 2023-06-29 DIAGNOSIS — H01002 Unspecified blepharitis right lower eyelid: Secondary | ICD-10-CM | POA: Diagnosis not present

## 2023-06-29 DIAGNOSIS — H43813 Vitreous degeneration, bilateral: Secondary | ICD-10-CM | POA: Diagnosis not present

## 2023-06-29 DIAGNOSIS — H02831 Dermatochalasis of right upper eyelid: Secondary | ICD-10-CM | POA: Diagnosis not present

## 2023-06-29 DIAGNOSIS — H01004 Unspecified blepharitis left upper eyelid: Secondary | ICD-10-CM | POA: Diagnosis not present

## 2023-07-02 ENCOUNTER — Encounter: Payer: Self-pay | Admitting: Gastroenterology

## 2023-07-06 DIAGNOSIS — H401131 Primary open-angle glaucoma, bilateral, mild stage: Secondary | ICD-10-CM | POA: Diagnosis not present

## 2023-07-10 ENCOUNTER — Ambulatory Visit: Payer: PPO | Admitting: Internal Medicine

## 2023-07-14 ENCOUNTER — Encounter: Payer: Self-pay | Admitting: Internal Medicine

## 2023-07-14 ENCOUNTER — Ambulatory Visit (INDEPENDENT_AMBULATORY_CARE_PROVIDER_SITE_OTHER): Payer: PPO | Admitting: Internal Medicine

## 2023-07-14 VITALS — BP 118/60 | HR 88 | Ht <= 58 in | Wt 156.2 lb

## 2023-07-14 DIAGNOSIS — E039 Hypothyroidism, unspecified: Secondary | ICD-10-CM

## 2023-07-14 MED ORDER — LEVOTHYROXINE SODIUM 75 MCG PO TABS: 75.0000 ug | ORAL_TABLET | ORAL | 3 refills | Status: DC

## 2023-07-14 MED ORDER — LEVOTHYROXINE SODIUM 50 MCG PO TABS: 50.0000 ug | ORAL_TABLET | ORAL | 3 refills | Status: DC

## 2023-07-14 NOTE — Patient Instructions (Signed)
Please continue levothyroxine 50 alternating with 75 mcg every other day.  Please come back for labs in 1.5 months  Take the thyroid hormone every day, with water, at least 30 minutes before breakfast, separated by at least 4 hours from: - acid reflux medications - calcium - iron - multivitamins  You should have an endocrinology follow-up appointment in 6 months.

## 2023-07-14 NOTE — Progress Notes (Signed)
 Patient ID: Kim Grimes, female   DOB: December 09, 1950, 73 y.o.   MRN: [id number]  HPI  Kim Grimes is a 73 y.o.-year-old female, returning for follow-up for hypothyroidism.  Kim Grimes previously saw Dr. Kassie, but last visit with me 6 months ago. Kim Grimes saw DR. Nida before.  Interim history: Kim Grimes has weight gain (net 2 pounds since last visit), but Kim Grimes heat intolerance is improved. Otherwise, Kim Grimes is feeling well, without complaints.  Reviewed and addended hx: Pt. has been dx with hypothyroidism in 2011 by Kim Grimes 2/2 fatigue >> on generic levothyroxine . Kim Grimes remembers having brittle hair after starting LT4 100 mcg daily in 2011.  In 07/2022, Kim Grimes was on LT4 88 mcg daily, and we decreased the dose to 75 mcg daily. In 08/2022, Kim Grimes was on LT4 75 mcg daily, and we decreased the dose to 50 mcg daily. In 10/2022, Kim Grimes was on LT4 50 mcg daily, and we increased the dose to 50 alternating with 75 mcg every other day  Kim Grimes takes the levothyroxine : - not forgetting it anymore >> if Kim Grimes does, Kim Grimes takes it later! - moved LT4 from the kitchen counter to Kim Grimes dresser - in am (2-5 am) -when walking out the door to go to work - coffee + creamer 3h later - fasting - at least 3h from b'fast - no calcium - no iron - no multivitamins - + PPIs (Prevacid  changed from Dexilant ) at 8 am - not on Biotin  I reviewed pt's thyroid  tests: Lab Results  Component Value Date   TSH 1.52 06/26/2023   TSH 1.52 02/26/2023   TSH 1.67 12/23/2022   TSH 2.39 10/29/2022   TSH 0.03 (L) 09/15/2022   TSH 0.03 (L) 08/13/2022   TSH 1.20 11/04/2021   TSH 3.70 05/03/2021   TSH 1.95 07/12/2020   TSH 4.01 04/10/2020   FREET4 1.4 06/26/2023   FREET4 1.28 02/26/2023   FREET4 1.16 12/23/2022   FREET4 1.15 10/29/2022   FREET4 1.65 (H) 09/15/2022   FREET4 1.78 (H) 08/13/2022   FREET4 1.25 11/04/2021   FREET4 1.2 05/03/2021   FREET4 1.30 07/12/2020   FREET4 0.91 04/10/2020   Antithyroid antibodies: Component      Latest Ref Rng 08/13/2022  Thyroperoxidase Ab SerPl-aCnc     <9 IU/mL 1   Thyroglobulin Ab     < or = 1 IU/mL <1    Pt describes: - + weight gain despite exercise and not overeating  No: - fatigue - cold intolerance - depression - constipation - dry skin - hair loss  Pt denies: - feeling nodules in neck - dysphagia - choking Occasional hoarseness - sinus drainage. Occasionally, Kim Grimes is coughing in Kim Grimes sleep.  Kim Grimes has no FH of thyroid  disorders. No FH of thyroid  cancer.  No h/o radiation tx to head or neck. No recent use of iodine  supplements. No recent steroid use. No use of herbal supplements.  Pt. also has a history of CAD and carotid artery stenosis, hyperlipidemia, GERD, hiatal hernia, IBS, vitamin D  deficiency, osteoporosis.  ROS: + see HPI  Past Medical History:  Diagnosis Date   Anxiety disorder    Carotid artery stenosis    Chronic pelvic pain in female    Coronary artery disease    Endometriosis    GERD (gastroesophageal reflux disease)    Helicobacter pylori gastritis    Dr. Obie    Hiatal hernia 2003   on UGI    Hypothyroidism 2009   IBS (irritable bowel syndrome) 2003  Obesity (BMI 30-39.9) DEC 2011 145 LBS   Osteoporosis    PONV (postoperative nausea and vomiting)    Past Surgical History:  Procedure Laterality Date   ABDOMINAL HYSTERECTOMY     adhesiolysis     dense adhesion between rectum and sigmoid,  & pelvis    BIOPSY  05/20/2022   Procedure: BIOPSY;  Surgeon: Cindie Carlin POUR, DO;  [facility];  Service: Endoscopy;;   BRAVO PH STUDY  01/19/2012   Procedure: BRAVO PH STUDY;  Surgeon: Margo LITTIE Haddock, MD;  [facility];  Service: Endoscopy;;   CATARACT EXTRACTION W/PHACO Left 06/03/2019   Procedure: CATARACT EXTRACTION PHACO AND INTRAOCULAR LENS PLACEMENT LEFT EYE (CDE: 2.92);  Surgeon: Harrie Agent, MD;  Location: [facility];  Service: Ophthalmology;  Laterality: Left;   COLONOSCOPY  03: [facility]/D & 08: SCREENING   WNL'S    COLONOSCOPY  07/28/2011   sessile polyp in ascending colon/internal hemorrhoids. Next colonoscopy January 2018   COLONOSCOPY N/A 08/01/2016   Procedure: COLONOSCOPY;  Surgeon: Margo LITTIE Haddock, MD;  [facility];  Service: Endoscopy;  Laterality: N/A;  930    COLONOSCOPY WITH PROPOFOL  N/A 12/24/2021   Procedure: COLONOSCOPY WITH PROPOFOL ;  Surgeon: Cindie Carlin POUR, DO;  [facility];  Service: Endoscopy;  Laterality: N/A;  9:00am   ESOPHAGOGASTRODUODENOSCOPY  01/19/2012   SLF: SMALL Hiatal hernia/Mild gastritis   ESOPHAGOGASTRODUODENOSCOPY (EGD) WITH PROPOFOL  N/A 05/20/2022   Procedure: ESOPHAGOGASTRODUODENOSCOPY (EGD) WITH PROPOFOL ;  Surgeon: Cindie Carlin POUR, DO;  [facility];  Service: Endoscopy;  Laterality: N/A;  12:45 pm, pt knows to arrive at 7:00   EYE SURGERY Right 10/18/2013   catarat extraction   LAPAROSCOPY     OLECRANON BURSECTOMY Left 07/01/2018   Procedure: LEFT ELBOW OLECRANON BURSECTOMY AND SPUR EXCISION;  Surgeon: Margrette Taft BRAVO, MD;  Location: [facility];  Service: Orthopedics;  Laterality: Left;   POLYPECTOMY  12/24/2021   Procedure: POLYPECTOMY INTESTINAL;  Surgeon: Cindie Carlin POUR, DO;  [facility];  Service: Endoscopy;;   rt foot sx Bilateral    s/p hysterectomy     SALPINGOOPHORECTOMY     UPPER GASTROINTESTINAL ENDOSCOPY  DEC 2010: DYSPEPSIA/DYSPHAGIA   SCH RING/ESO DIL 16 MM, GASTRITIS/DUODENITIS 2o to Aleve /on OMP PRN   Social History   Socioeconomic History   Marital status: Divorced    Spouse name: Not on file   Number of children: Not on file   Years of education: Not on file   Highest education level: Not on file  Occupational History   Occupation: fulltime at [employer]   Tobacco Use   Smoking status: Never   Smokeless tobacco: Never   Tobacco comments:    Never smoked  Vaping Use   Vaping status: Never Used  Substance and Sexual Activity   Alcohol use: Not Currently    Comment:  occasional, maybe once/year   Drug use: No   Sexual activity: Not Currently    Birth control/protection: None  Other Topics Concern   Not on file  Social History Narrative   Not on file   Social Drivers of Health   Financial Resource Strain: Low Risk  (05/27/2023)   Overall Financial Resource Strain (CARDIA)    Difficulty of Paying Living Expenses: Not hard at all  Food Insecurity: No Food Insecurity (05/27/2023)   Hunger Vital Sign    Worried About Running Out of Food in the Last Year: Never true    Ran Out  of Food in the Last Year: Never true  Transportation Needs: No Transportation Needs (05/27/2023)   PRAPARE - Administrator, Civil Service (Medical): No    Lack of Transportation (Non-Medical): No  Physical Activity: Sufficiently Active (05/27/2023)   Exercise Vital Sign    Days of Exercise per Week: 5 days    Minutes of Exercise per Session: 30 min  Stress: No Stress Concern Present (05/27/2023)   [employer] of Occupational Health - Occupational Stress Questionnaire    Feeling of Stress : Not at all  Social Connections: Moderately Integrated (05/27/2023)   Social Connection and Isolation Panel [NHANES]    Frequency of Communication with Friends and Family: More than three times a week    Frequency of Social Gatherings with Friends and Family: Three times a week    Attends Religious Services: More than 4 times per year    Active Member of Clubs or Organizations: Yes    Attends Banker Meetings: More than 4 times per year    Marital Status: Divorced  Intimate Partner Violence: Not At Risk (05/27/2023)   Humiliation, Afraid, Rape, and Kick questionnaire    Fear of Current or Ex-Partner: No    Emotionally Abused: No    Physically Abused: No    Sexually Abused: No   Current Outpatient Medications on File Prior to Visit  Medication Sig Dispense Refill   azelastine  (ASTELIN ) 0.1 % nasal spray Place 1 spray into both nostrils 2 (two) times  daily as needed for rhinitis. Use in each nostril as directed (Patient not taking: Reported on 05/19/2023)     dexlansoprazole  (DEXILANT ) 60 MG capsule Take 1 capsule (60 mg total) by mouth daily. 90 capsule 3   levothyroxine  (SYNTHROID ) 50 MCG tablet TAKE 1 TABLET BY MOUTH EVERY DAY BEFORE BREAKFAST (Patient taking differently: 50 mcg alternates with 75 mcg every other day.) 90 tablet 0   levothyroxine  (SYNTHROID ) 75 MCG tablet TAKE 1 TABLET BY MOUTH EVERY DAY 90 tablet 0   montelukast  (SINGULAIR ) 10 MG tablet TAKE 1 TABLET BY MOUTH EVERYDAY AT BEDTIME (Patient not taking: Reported on 05/19/2023) 90 tablet 1   Current Facility-Administered Medications on File Prior to Visit  Medication Dose Route Frequency Provider Last Rate Last Admin   methylPREDNISolone  acetate (DEPO-MEDROL ) injection 40 mg  40 mg Intra-articular Once Keeling, Wayne, MD       Allergies  Allergen Reactions   Aspirin Other (See Comments)    blood rushing   Diazepam  Other (See Comments)    Agitation   Prednisone Other (See Comments)    Any type of steroids. Pt states Kim Grimes has seen what they do to other people and Kim Grimes just doesn't take them   Family History  Problem Relation Age of Onset   Prostate cancer Father    Colon cancer Father        < 85 YO   Cancer Father        prostate and colon   Pancreatic cancer Mother    Cancer Mother        pancreas   Diabetes Mother    Diabetes Brother    Cancer Brother        prostate    Diabetes Sister        prediabetic   Heart disease Brother        CHF   Thyroid  disease Neg Hx    PE: BP 118/60   Pulse 88   Ht 4' 10 (1.473 m)  Wt 156 lb 3.2 oz (70.9 kg)   SpO2 97%   BMI 32.65 kg/m  Wt Readings from Last 10 Encounters:  07/14/23 156 lb 3.2 oz (70.9 kg)  05/27/23 157 lb (71.2 kg)  05/19/23 157 lb 3.2 oz (71.3 kg)  05/04/23 154 lb (69.9 kg)  03/23/23 154 lb (69.9 kg)  03/05/23 154 lb (69.9 kg)  02/13/23 152 lb (68.9 kg)  02/10/23 154 lb (69.9 kg)  02/04/23  155 lb 1.6 oz (70.4 kg)  01/27/23 154 lb 12.8 oz (70.2 kg)   Constitutional:  normal weight, in NAD Eyes:  EOMI, no exophthalmos ENT: no neck masses, no cervical lymphadenopathy Cardiovascular: RRR, No MRG, + LLE mild periankle edema Respiratory: CTA B Musculoskeletal: no deformities Skin:no rashes Neurological: no tremor with outstretched hands  ASSESSMENT: 1. Hypothyroidism  2.  Hair loss  PLAN:  1. Patient with longstanding hypothyroidism, on levothyroxine  therapy - latest thyroid  labs reviewed with pt. >> normal: Lab Results  Component Value Date   TSH 1.52 06/26/2023  - Kim Grimes continues on LT4 50 alternating with 75 mcg every other day) daily equivalent of 62.5 mcg daily).  Kim Grimes had suppressed TSH on 75 mcg of LT4 daily. - pt feels good on this dose.  Kim Grimes still mentions some weight gain, but on our scale, Kim Grimes only gained Let's 2 pounds since last visit.  Kim Grimes - we discussed about taking the thyroid  hormone every day, with water , >30 minutes before breakfast, separated by >4 hours from acid reflux medications, calcium, iron, multivitamins. Pt. is taking it correctly.  Kim Grimes was previously missing many doses but now taking it consistently or doubling up on the dose the next day if Kim Grimes forgets 1 dose.  Of note, Kim Grimes is on Dexilant  in the morning, but Kim Grimes takes LT4 at 4:30 AM and Dexilant  around 8 AM, 30 minutes before breakfast. - I will see Kim Grimes back in a year  2.  Hair loss -Female-pattern -Kim Grimes initially had hair breakage after Kim Grimes started a higher dose of levothyroxine  when Kim Grimes was diagnosed with hypothyroidism and Kim Grimes also started to have hair loss on 50 mcg LT4 daily. -Kim Grimes feels that Kim Grimes hair loss improved after switching to the above dose of T4.  At today's visit Kim Grimes is wondering whether Kim Grimes could use only 1 dose or the other but I recommended against this due to the excellent TFTs and the fact that Kim Grimes had hair loss with other doses. -Will continue the same doses for now  Lela Fendt, MD PhD [facility]

## 2023-08-18 ENCOUNTER — Encounter: Payer: Self-pay | Admitting: Family Medicine

## 2023-08-18 ENCOUNTER — Ambulatory Visit (INDEPENDENT_AMBULATORY_CARE_PROVIDER_SITE_OTHER): Payer: Medicare HMO | Admitting: Family Medicine

## 2023-08-18 VITALS — BP 130/82 | HR 70 | Ht <= 58 in | Wt 154.0 lb

## 2023-08-18 DIAGNOSIS — K219 Gastro-esophageal reflux disease without esophagitis: Secondary | ICD-10-CM | POA: Diagnosis not present

## 2023-08-18 DIAGNOSIS — R252 Cramp and spasm: Secondary | ICD-10-CM | POA: Diagnosis not present

## 2023-08-18 DIAGNOSIS — M79675 Pain in left toe(s): Secondary | ICD-10-CM

## 2023-08-18 DIAGNOSIS — E559 Vitamin D deficiency, unspecified: Secondary | ICD-10-CM

## 2023-08-18 DIAGNOSIS — Z13220 Encounter for screening for lipoid disorders: Secondary | ICD-10-CM

## 2023-08-18 DIAGNOSIS — K429 Umbilical hernia without obstruction or gangrene: Secondary | ICD-10-CM

## 2023-08-18 NOTE — Patient Instructions (Addendum)
 F/u in 6 months, call  you need me sooner  Chem 7 and eGFR, magnesium and vit d level today  You are referred to General surgery, Dr Henreitta Leber re umbilical hernia  Reduce weights to 5 pounds please, les strain on muscles  You need to have Dr Marletta Lor re evaluate your reflux since you have uncontrolled burning and  reflux symptoms I have referred you  Pls start using your allergy medication for drainage   Thanks for choosing [facility], we consider it a privelige to serve you.

## 2023-08-18 NOTE — Assessment & Plan Note (Signed)
Refer surgery eval

## 2023-08-19 LAB — BMP8+EGFR
BUN/Creatinine Ratio: 17 (ref 12–28)
BUN: 15 mg/dL (ref 8–27)
CO2: 24 mmol/L (ref 20–29)
Calcium: 9.3 mg/dL (ref 8.7–10.3)
Chloride: 105 mmol/L (ref 96–106)
Creatinine, Ser: 0.87 mg/dL (ref 0.57–1.00)
Glucose: 88 mg/dL (ref 70–99)
Potassium: 4.3 mmol/L (ref 3.5–5.2)
Sodium: 142 mmol/L (ref 134–144)
eGFR: 71 mL/min/{1.73_m2} (ref >59)

## 2023-08-19 LAB — MAGNESIUM: Magnesium: 2.1 mg/dL (ref 1.6–2.3)

## 2023-08-19 LAB — VITAMIN D 25 HYDROXY (VIT D DEFICIENCY, FRACTURES): Vit D, 25-Hydroxy: 30.1 ng/mL (ref 30.0–100.0)

## 2023-08-21 ENCOUNTER — Encounter: Payer: Self-pay | Admitting: Family Medicine

## 2023-08-23 DIAGNOSIS — R252 Cramp and spasm: Secondary | ICD-10-CM | POA: Insufficient documentation

## 2023-08-23 DIAGNOSIS — Z13220 Encounter for screening for lipoid disorders: Secondary | ICD-10-CM | POA: Insufficient documentation

## 2023-08-23 NOTE — Assessment & Plan Note (Signed)
 Updated lab shows level is low normal

## 2023-08-23 NOTE — Assessment & Plan Note (Signed)
 Needs tpo reduce weights she is lifting, and magnesium level checked and is normal

## 2023-08-23 NOTE — Progress Notes (Signed)
   Kim Grimes     MRN: [id number]      DOB: March 10, 1951  Chief Complaint  Patient presents with   Follow-up    Follow up gout flare up/ muscle cramps in legs    HPI Kim Grimes is here for follow up and re-evaluation of chronic medical conditions, medication management and review of any available recent lab and radiology data.  Preventive health is updated, specifically  Cancer screening and Immunization.   Questions or concerns regarding consultations or procedures which the PT has had in the interim are  addressed. The PT denies any adverse reactions to current medications since the last visit.  C/o uncontrolled GERD symptoms with epigastric burning, will refer to ROS Denies recent fever or chills. Dc/o sinus pressure and nasal congestion Denies chest congestion, productive cough or wheezing. Denies chest pains, palpitations and leg swelling C/o heartburn and reflux symptoms Denies dysuria, frequency, hesitancy or incontinence. Denies joint pain, swelling and limitation in mobility. Denies headaches, seizures, numbness, or tingling. Denies depression, anxiety or insomnia. C/o hernia wants it assessed by surgery considering repair, denies pain but notes its persistence/ presence C/o arm muscle pains , started doing weights at the gym and has started at 20 pounds, aching since then PE  BP 130/82   Pulse 70   Ht 4\' 10"  (1.473 m)   Wt 154 lb (69.9 kg)   SpO2 98%   BMI 32.19 kg/m   Patient alert and oriented and in no cardiopulmonary distress.  HEENT: No facial asymmetry, EOMI,     Neck supple .No sinus tenderness, positive nasal drainage, no cervical adenopathy  Chest: Clear to auscultation bilaterally.  CVS: S1, S2 no murmurs, no S3.Regular rate.  ABD: Soft mild epigastric tenderness, no guarding or rebound, umbilical hernia easily reduced present Ext: No edema  MS: Adequate ROM spine, shoulders, hips and knees.  Skin: Intact, no ulcerations or rash  noted.  Psych: Good eye contact, normal affect. Memory intact not anxious or depressed appearing.  CNS: CN 2-12 intact, power,  normal throughout.no focal deficits noted.   Assessment & Plan  Umbilical hernia Refer surgery eval  GERD (gastroesophageal reflux disease) Symptomatoic on current medication GI to re eval  Vitamin D deficiency Updated lab shows level is low normal  Muscle cramps Needs tpo reduce weights she is lifting, and magnesium level checked and is normal

## 2023-08-23 NOTE — Assessment & Plan Note (Signed)
 Symptomatoic on current medication GI to re eval

## 2023-09-23 ENCOUNTER — Ambulatory Visit: Admitting: General Surgery

## 2023-09-23 ENCOUNTER — Encounter: Payer: Self-pay | Admitting: General Surgery

## 2023-09-23 VITALS — BP 126/89 | HR 72 | Temp 97.9°F | Ht <= 58 in | Wt 152.0 lb

## 2023-09-23 DIAGNOSIS — K429 Umbilical hernia without obstruction or gangrene: Secondary | ICD-10-CM | POA: Diagnosis not present

## 2023-09-23 NOTE — Patient Instructions (Signed)
 No abdominal wall hernia in the epigastric region (upper belly) appreciated on exam or seen on CT from June 2024. Stable umbilical hernia (belly button hernia) 1cm in size with fat that has been there since a CT in 2003 and has not changed. This is not in the location of your discomfort.  Small Hiatal hernia discussed as this was noted on Endoscopy with Dr. Marletta Lor. This is not something I would recommend surgery for as it is small and not even really seen on CT scan.   If you continue to have pain, you can get a repeat CT. Offered CT today but does not want to pursue.

## 2023-09-23 NOTE — Progress Notes (Signed)
 [facility] History and Physical  Reason for Referral: Hernia  Referring Physician: Dr. Lodema Hong, MD   Chief Complaint   New Patient (Initial Visit)     Kim Grimes is a 73 y.o. female.  HPI:   Discussed the use of AI scribe software for clinical note transcription with the patient, who gave verbal consent to proceed.  History of Present Illness Kim Grimes is a 73 year old female who presents with an upper abdominal hernia. She was referred by Dr. Lodema Hong for evaluation of an abdominal wall hernia.  She has been experiencing concerns about an upper abdominal hernia for the past three to four months. Despite losing weight, the 'little pouch' where the hernia is located has not decreased in size along with the rest of her abdomen. She experiences pain when leaning over and describes the area as tender to touch, avoiding letting it be touched during examinations. However, she does not experience pain if the area is not touched or if she is not bending over, and she primarily notices it cosmetically.   Her medical history includes a small hiatal hernia, noted during an endoscopy performed by Dr. Marletta Lor in November 2023. She experiences significant gastroesophageal reflux disease (GERD) symptoms and is currently on Dexilant, a strong acid reducer, to manage these symptoms.  She has a history of hypothyroidism, for which she takes levothyroxine and alternates with another thyroid medication.  There is a family history of pancreatic cancer, which is a concern for her.       Past Medical History:  Diagnosis Date   Anxiety disorder    Carotid artery stenosis    Chronic pelvic pain in female    Coronary artery disease    Endometriosis    GERD (gastroesophageal reflux disease)    Helicobacter pylori gastritis    Dr. Juanda Chance    Hiatal hernia 2003   on UGI    Hypothyroidism 2009   IBS (irritable bowel syndrome) 2003   Obesity (BMI 30-39.9) DEC 2011 145  LBS   Osteoporosis    PONV (postoperative nausea and vomiting)     Past Surgical History:  Procedure Laterality Date   ABDOMINAL HYSTERECTOMY     adhesiolysis     dense adhesion between rectum and sigmoid,  & pelvis    BIOPSY  05/20/2022   Procedure: BIOPSY;  Surgeon: Lanelle Bal, DO;  [facility];  Service: Endoscopy;;   BRAVO PH STUDY  01/19/2012   Procedure: BRAVO PH STUDY;  Surgeon: West Bali, MD;  [facility];  Service: Endoscopy;;   CATARACT EXTRACTION W/PHACO Left 06/03/2019   Procedure: CATARACT EXTRACTION PHACO AND INTRAOCULAR LENS PLACEMENT LEFT EYE (CDE: 2.92);  Surgeon: Fabio Pierce, MD;  [facility];  Service: Ophthalmology;  Laterality: Left;   COLONOSCOPY  03: AP/D & 08: SCREENING   WNL'S   COLONOSCOPY  07/28/2011   sessile polyp in ascending colon/internal hemorrhoids. Next colonoscopy January 2018   COLONOSCOPY N/A 08/01/2016   Procedure: COLONOSCOPY;  Surgeon: West Bali, MD;  [facility];  Service: Endoscopy;  Laterality: N/A;  930    COLONOSCOPY WITH PROPOFOL N/A 12/24/2021   Procedure: COLONOSCOPY WITH PROPOFOL;  Surgeon: Lanelle Bal, DO;  [facility];  Service: Endoscopy;  Laterality: N/A;  9:00am   ESOPHAGOGASTRODUODENOSCOPY  01/19/2012   SLF: SMALL Hiatal hernia/Mild gastritis   ESOPHAGOGASTRODUODENOSCOPY (EGD) WITH PROPOFOL N/A 05/20/2022   Procedure: ESOPHAGOGASTRODUODENOSCOPY (EGD) WITH PROPOFOL;  Surgeon: Marletta Lor,  Hennie Duos, DO;  [facility];  Service: Endoscopy;  Laterality: N/A;  12:45 pm, pt knows to arrive at 7:00   EYE SURGERY Right 10/18/2013   catarat extraction   LAPAROSCOPY     OLECRANON BURSECTOMY Left 07/01/2018   Procedure: LEFT ELBOW OLECRANON BURSECTOMY AND SPUR EXCISION;  Surgeon: Vickki Hearing, MD;  [facility];  Service: Orthopedics;  Laterality: Left;   POLYPECTOMY  12/24/2021   Procedure: POLYPECTOMY INTESTINAL;  Surgeon: Lanelle Bal, DO;   [facility];  Service: Endoscopy;;   rt foot sx Bilateral    s/p hysterectomy     SALPINGOOPHORECTOMY     UPPER GASTROINTESTINAL ENDOSCOPY  DEC 2010: DYSPEPSIA/DYSPHAGIA   SCH RING/ESO DIL 16 MM, GASTRITIS/DUODENITIS 2o to Aleve/on OMP PRN    Family History  Problem Relation Age of Onset   Prostate cancer Father    Colon cancer Father        < 43 YO   Cancer Father        prostate and colon   Pancreatic cancer Mother    Cancer Mother        pancreas   Diabetes Mother    Diabetes Brother    Cancer Brother        prostate    Diabetes Sister        prediabetic   Heart disease Brother        CHF   Thyroid disease Neg Hx     Social History   Tobacco Use   Smoking status: Never   Smokeless tobacco: Never   Tobacco comments:    Never smoked  Vaping Use   Vaping status: Never Used  Substance Use Topics   Alcohol use: Not Currently    Comment: occasional, maybe once/year   Drug use: No    Medications: I have reviewed the patient's current medications. Allergies as of 09/23/2023       Reactions   Aspirin Other (See Comments)   "blood rushing"   Diazepam Other (See Comments)   Agitation   Prednisone Other (See Comments)   Any type of steroids. Pt states she has seen what they do to other people and she just doesn't take them        Medication List        Accurate as of September 23, 2023 11:20 AM. If you have any questions, ask your nurse or doctor.          azelastine 0.1 % nasal spray Commonly known as: ASTELIN Place 1 spray into both nostrils 2 (two) times daily as needed for rhinitis. Use in each nostril as directed   dexlansoprazole 60 MG capsule Commonly known as: DEXILANT Take 1 capsule (60 mg total) by mouth daily.   levothyroxine 75 MCG tablet Commonly known as: SYNTHROID Take 1 tablet (75 mcg total) by mouth every other day.   levothyroxine 50 MCG tablet Commonly known as: SYNTHROID Take 1 tablet (50 mcg total) by mouth every  other day.   montelukast 10 MG tablet Commonly known as: SINGULAIR TAKE 1 TABLET BY MOUTH EVERYDAY AT BEDTIME         ROS:  A comprehensive review of systems was negative except for: Ears, nose, mouth, throat, and face: positive for sore throat, sinus problems Respiratory: positive for cough Gastrointestinal: positive for abdominal pain and reflux symptoms  Blood pressure 126/89, pulse 72, temperature 97.9 F (36.6 C), temperature source Oral, height 4\' 10"  (1.473 m), weight 152 lb (  68.9 kg), SpO2 92%.  Physical Exam GENERAL: Alert, cooperative, well developed, no acute distress. HEENT: Normocephalic, normal oropharynx, moist mucous membranes. CHEST: Clear to auscultation bilaterally, no wheezes, rhonchi, or crackles. CARDIOVASCULAR: Regular rate and rhythm, normal heart sounds, S1 and S2 normal without murmurs. ABDOMEN: Soft, non-tender, non-distended, without organomegaly, normal bowel sounds. No abdominal wall hernia in the epigastric region. Stable umbilical hernia about 1 cm in size with fat. EXTREMITIES: No cyanosis or edema. NEUROLOGICAL: Cranial nerves grossly intact, moves all extremities without gross motor or sensory deficit.  Results: CT scans 2024 back to 2003 reviewed and showed patient and sister No epigastric hernia noted and small stable umbilical hernia with fat 1cm in size    Assessment & Plan Epigastric Pain No abdominal wall epigastric hernia or significant findings on CT scan. Some minor diastasis possible but nothing extreme on CT scan; Pain unrelated to umbilical hernia. - Offered repeat CT scan, declined. - Consult primary care if symptoms persist.  Hiatal Hernia Small hiatal hernia identified on endoscopy. Managed with reflux medication. Surgical repair not recommended for small hernias given risk of recurrence - Continue Dexilant. - Consult gastroenterologist if needed.  Umbilical Hernia Tiny umbilical hernia present since 2003, unlikely to  cause symptoms unless it enlarges or becomes painful. - Monitor for changes. - Seek medical attention if painful or enlarges.     All questions were answered to the satisfaction of the patient and family  Lucretia Roers 09/23/2023, 11:20 AM

## 2023-09-24 ENCOUNTER — Ambulatory Visit: Payer: Medicare HMO | Admitting: General Surgery

## 2023-09-24 ENCOUNTER — Other Ambulatory Visit: Payer: Self-pay | Admitting: Gastroenterology

## 2023-09-24 ENCOUNTER — Telehealth: Payer: Self-pay | Admitting: *Deleted

## 2023-09-24 NOTE — Telephone Encounter (Signed)
 Patient called in stating her dexilant is cost $600 a month. She wants to know if he medication can be changed. She does having upcoming OV 4/7

## 2023-09-25 ENCOUNTER — Other Ambulatory Visit: Payer: Self-pay | Admitting: Gastroenterology

## 2023-09-25 DIAGNOSIS — K219 Gastro-esophageal reflux disease without esophagitis: Secondary | ICD-10-CM

## 2023-09-25 MED ORDER — ESOMEPRAZOLE MAGNESIUM 40 MG PO CPDR
40.0000 mg | DELAYED_RELEASE_CAPSULE | Freq: Two times a day (BID) | ORAL | 1 refills | Status: DC
Start: 2023-09-25 — End: 2023-10-02

## 2023-09-25 NOTE — Telephone Encounter (Signed)
 Spoke to pt, she would like to try esomeprazole. She states she called her insurance company and they told her that she could get that medications for free.

## 2023-09-28 NOTE — Telephone Encounter (Signed)
 Noted. Pt was approved for esomeprazole. Informed pt.

## 2023-10-02 ENCOUNTER — Other Ambulatory Visit: Payer: Self-pay | Admitting: Gastroenterology

## 2023-10-02 DIAGNOSIS — K219 Gastro-esophageal reflux disease without esophagitis: Secondary | ICD-10-CM

## 2023-10-05 ENCOUNTER — Ambulatory Visit: Admitting: Gastroenterology

## 2023-10-05 ENCOUNTER — Encounter: Payer: Self-pay | Admitting: Gastroenterology

## 2023-10-05 VITALS — BP 126/80 | HR 82 | Temp 98.2°F | Ht <= 58 in | Wt 152.6 lb

## 2023-10-05 DIAGNOSIS — K5900 Constipation, unspecified: Secondary | ICD-10-CM | POA: Diagnosis not present

## 2023-10-05 DIAGNOSIS — M6208 Separation of muscle (nontraumatic), other site: Secondary | ICD-10-CM

## 2023-10-05 DIAGNOSIS — K5904 Chronic idiopathic constipation: Secondary | ICD-10-CM

## 2023-10-05 DIAGNOSIS — K219 Gastro-esophageal reflux disease without esophagitis: Secondary | ICD-10-CM

## 2023-10-05 DIAGNOSIS — K429 Umbilical hernia without obstruction or gangrene: Secondary | ICD-10-CM

## 2023-10-05 DIAGNOSIS — Z860101 Personal history of adenomatous and serrated colon polyps: Secondary | ICD-10-CM | POA: Diagnosis not present

## 2023-10-05 MED ORDER — SUCRALFATE 1 G PO TABS
1.0000 g | ORAL_TABLET | Freq: Three times a day (TID) | ORAL | 0 refills | Status: DC
Start: 2023-10-05 — End: 2024-01-20

## 2023-10-05 MED ORDER — LUBIPROSTONE 8 MCG PO CAPS
8.0000 ug | ORAL_CAPSULE | Freq: Two times a day (BID) | ORAL | 1 refills | Status: DC
Start: 2023-10-05 — End: 2023-10-08

## 2023-10-05 NOTE — Patient Instructions (Addendum)
 Please see attached information regarding diastasis recti.  Please start carafate 1g four times daily (with meals and at bedtime) - you can take the tablet alone or let it dissolve in 1-2 oz water and drink as a slurry.   Start amtitiza 8 mcg  (1 tablet) twice daily with food to avoid nausea. This is to help with constipation. Please call or message if there is any issue with cost or if alternative is preferred by insurance.   Follow a GERD diet:  Avoid fried, fatty, greasy, spicy, citrus foods. Avoid caffeine and carbonated beverages. Avoid chocolate. Try eating 4-6 small meals a day rather than 3 large meals. Do not eat within 3 hours of laying down. Prop head of bed up on wood or bricks to create a 6 inch incline.  It was a pleasure to see you today. I want to create trusting relationships with patients. If you receive a survey regarding your visit,  I greatly appreciate you taking time to fill this out on paper or through your MyChart. I value your feedback.  Brooke Bonito, MSN, FNP-BC, AGACNP-BC [facility]

## 2023-10-05 NOTE — Progress Notes (Signed)
 GI Office Note    Referring Provider: Towanda Fret, MD Primary Care Physician:  Towanda Fret, MD Primary Gastroenterologist: Rolando Cliche. Mordechai April, DO  Date:  10/05/2023  ID:  Kim Grimes, DOB 1951/01/08, MRN [id number]  Chief Complaint   Chief Complaint  Patient presents with   Follow-up    Pt here for follow up and she wants to change her medication. Different abd issues   History of Present Illness  Kim Grimes is a 73 y.o. female with a history of H. pylori gastritis, GERD, constipation, hypothyroidism, CAD, anxiety, hiatal hernia presenting today with multiple complaints.   EGD 01/19/2012: -Small hernia -Gastritis -Fundic gland polyp and biopsies negative for H. pylori   Colonoscopy 07/2016: -2 polyps at the hepatic flexure and mid ascending colon -Internal hemorrhoids -Redundant left colon -Repeat colonoscopy in 5 years   EGD 04/2022: -Small hiatal hernia -Scattered mild inflammation characterized by erythema in the gastric body -Multiple 3-8 mm sessile polyps with no bleeding -Pathology revealed benign fundic gland polyps, gastric biopsy negative for H. Pylori   Colonoscopy 12/24/2021 for surveillance: -1 tubular adenoma removed -Due for repeat in 2028   Office visit 08/20/2022.  Feeling well.  Works full-time at [employer]  Exercises regularly.  Dexilant expensive but seems to be helping hemoglobins and regurgitation issues she was having previously.  No dysphagia.  Denied constipation, melena, or BRBPR.  Concerned about upper abdominal distention that occurs with sitting up.  Advised to continue Dexilant and return as needed.   Office visit 12/08/22.  Reported pain in her lower abdomen with walking but not on palpation, ongoing for about 2 weeks.  Pain primarily in the right lower quadrant and have been getting progressively worse, improved with laying down.  Pain does not worsen after meals.  GERD controlled on Dexilant previously, lansoprazole  not helping.  Continues to feel bloated all the time.  Tried Nexium and omeprazole in the past, unsure if she had tried Aciphex.  Had some mild diarrhea related to ice cream but bowel movements coming and going.  History of hemorrhoids.  Advise trial of low close number for GERD and if not effective consider resuming Dexilant.  Advised KUB as well as a CT of the abdomen pelvis.  Consider short course of dicyclomine if needed and advised to alternate ice and heat to the area of pain.   KUB 12/08/2022: - Non-obstructed bowel gas pattern -Stool within the cecum, sigmoid, and rectum -Degenerative changes of the lumbar   CT A/P with contrast 12/16/2022: -Diffuse hepatic steatosis -Subcentimeter hypoattenuating focus in the inferior right hepatic lobe too small to adequately characterize, appears unchanged since prior study, favored to be benign -No extrahepatic ductal dilation -No evidence of cholelithiasis or cholecystitis -Multiple left renal sinus cysts -No bowel wall thickening or inflammation   Last office visit 02/04/2023 with Dr. Mordechai April.  GERD not well-controlled with pantoprazole, noted ongoing cost issues with Dexilant.  Did not take samples of Eliquis now after reading side effect profile.  Suspected abdominal pain was secondary to MSK in nature given CT unremarkable.  Constipation relatively well-controlled.  Patient willing to go back on Dexilant despite cost issues to have better control of symptoms.  Advised to continue MiraLAX.  Advised continue to monitor abdominal pain, patient reported it was better as of late.  Advised to follow-up in 3-4 months  Last office visit 05/19/23.  Not yet started Dexilant, still having reflux symptoms.  Having lots of burping and bubbles that occur  in the melanite and feels that regurgitate up.  Dexilant had become $200 a month, had been taking omeprazole once daily.  No mucus present, blowing bubbles.  Drinking Sprite more instead of darker sodas.  Twist and  turns at night off her pillows, attempting to sleep propped up.  Reported some nausea vomiting and dysphagia.  BM every other day was upset over her weight gain.  Advised to resume Dexilant 60 mg once daily, stop lansoprazole.  Continue MiraLAX and align probiotic.  She reported that Dexilant was too expensive.  Patient reported she wanted to try Nexium given she called insurance and they stated that she could get this for free.  Seen general surge/abdominal pouchry in regards to hernia.  Noted had not decrease in size despite weight loss.  Experiencing pain with leaning over and describes the area is tender to touch..  Advised that possible diastases is present as well.  Advised to consult primary care if symptoms persist.  CT scan offered but she declined.  Today:  GERD - still having blowing bubbles sensation. Has been taking medication before breakfast and dinner. Sleeping with head elevated. No sour taste. States all of a sudden she starts "Blowing bubbles)"  Mostly bothering her in the abdominal bulge. Feels like it goes up in the middle and knot on the left side.  Bubbles can come first thing in the morning. If she drinks coke it will settle her stomach. She can feel it when she puts her bra on as well. Does about 100 sit ups per day and plays pickle ball.   Constipation - what she eats the night before she will have diarrhea and states her stools are thin. Sometimes not much substance to her stools and sometimes thin. Has not had normal stool in a while. Did not like miralax in the past. Has tried align. She states she has tried Linzess.   Wt Readings from Last 3 Encounters:  10/05/23 152 lb 9.6 oz (69.2 kg)  09/23/23 152 lb (68.9 kg)  08/18/23 154 lb (69.9 kg)    Current Outpatient Medications  Medication Sig Dispense Refill   azelastine (ASTELIN) 0.1 % nasal spray Place 1 spray into both nostrils 2 (two) times daily as needed for rhinitis. Use in each nostril as directed      esomeprazole (NEXIUM) 40 MG capsule TAKE 1 CAPSULE (40 MG TOTAL) BY MOUTH 2 (TWO) TIMES DAILY BEFORE A MEAL. 180 capsule 1   levothyroxine (SYNTHROID) 50 MCG tablet Take 1 tablet (50 mcg total) by mouth every other day. 45 tablet 3   levothyroxine (SYNTHROID) 75 MCG tablet Take 1 tablet (75 mcg total) by mouth every other day. 45 tablet 3   montelukast (SINGULAIR) 10 MG tablet TAKE 1 TABLET BY MOUTH EVERYDAY AT BEDTIME 90 tablet 1   No current facility-administered medications for this visit.    Past Medical History:  Diagnosis Date   Anxiety disorder    Carotid artery stenosis    Chronic pelvic pain in female    Coronary artery disease    Endometriosis    GERD (gastroesophageal reflux disease)    Helicobacter pylori gastritis    Dr. Grandville Lax    Hiatal hernia 2003   on UGI    Hypothyroidism 2009   IBS (irritable bowel syndrome) 2003   Obesity (BMI 30-39.9) DEC 2011 145 LBS   Osteoporosis    PONV (postoperative nausea and vomiting)     Past Surgical History:  Procedure Laterality Date   ABDOMINAL HYSTERECTOMY  adhesiolysis     dense adhesion between rectum and sigmoid,  & pelvis    BIOPSY  05/20/2022   Procedure: BIOPSY;  Surgeon: Vinetta Greening, DO;  [facility];  Service: Endoscopy;;   BRAVO PH STUDY  01/19/2012   Procedure: BRAVO PH STUDY;  Surgeon: Alyce Jubilee, MD;  [facility]: [facility];  Service: Endoscopy;;   CATARACT EXTRACTION W/PHACO Left 06/03/2019   Procedure: CATARACT EXTRACTION PHACO AND INTRAOCULAR LENS PLACEMENT LEFT EYE (CDE: 2.92);  Surgeon: Tarri Farm, MD;  Location: [facility];  Service: Ophthalmology;  Laterality: Left;   COLONOSCOPY  03: [facility]/D & 08: SCREENING   WNL'S   COLONOSCOPY  07/28/2011   sessile polyp in ascending colon/internal hemorrhoids. Next colonoscopy January 2018   COLONOSCOPY N/A 08/01/2016   Procedure: COLONOSCOPY;  Surgeon: Alyce Jubilee, MD;  [facility]: [facility];  Service: Endoscopy;  Laterality: N/A;  930     COLONOSCOPY WITH PROPOFOL N/A 12/24/2021   Procedure: COLONOSCOPY WITH PROPOFOL;  Surgeon: Vinetta Greening, DO;  [facility];  Service: Endoscopy;  Laterality: N/A;  9:00am   ESOPHAGOGASTRODUODENOSCOPY  01/19/2012   SLF: SMALL Hiatal hernia/Mild gastritis   ESOPHAGOGASTRODUODENOSCOPY (EGD) WITH PROPOFOL N/A 05/20/2022   Procedure: ESOPHAGOGASTRODUODENOSCOPY (EGD) WITH PROPOFOL;  Surgeon: Vinetta Greening, DO;  [facility];  Service: Endoscopy;  Laterality: N/A;  12:45 pm, pt knows to arrive at 7:00   EYE SURGERY Right 10/18/2013   catarat extraction   LAPAROSCOPY     OLECRANON BURSECTOMY Left 07/01/2018   Procedure: LEFT ELBOW OLECRANON BURSECTOMY AND SPUR EXCISION;  Surgeon: Darrin Emerald, MD;  Location: [facility];  Service: Orthopedics;  Laterality: Left;   POLYPECTOMY  12/24/2021   Procedure: POLYPECTOMY INTESTINAL;  Surgeon: Vinetta Greening, DO;  [facility];  Service: Endoscopy;;   rt foot sx Bilateral    s/p hysterectomy     SALPINGOOPHORECTOMY     UPPER GASTROINTESTINAL ENDOSCOPY  DEC 2010: DYSPEPSIA/DYSPHAGIA   SCH RING/ESO DIL 16 MM, GASTRITIS/DUODENITIS 2o to Aleve/on OMP PRN    Family History  Problem Relation Age of Onset   Prostate cancer Father    Colon cancer Father        < 37 YO   Cancer Father        prostate and colon   Pancreatic cancer Mother    Cancer Mother        pancreas   Diabetes Mother    Diabetes Brother    Cancer Brother        prostate    Diabetes Sister        prediabetic   Heart disease Brother        CHF   Thyroid disease Neg Hx     Allergies as of 10/05/2023 - Review Complete 10/05/2023  Allergen Reaction Noted   Aspirin Other (See Comments) 05/09/2014   Diazepam Other (See Comments)    Prednisone Other (See Comments) 02/21/2020    Social History   Socioeconomic History   Marital status: Divorced    Spouse name: Not on file   Number of children: Not on file   Years of education: Not on file    Highest education level: Not on file  Occupational History   Occupation: fulltime at [employer]   Tobacco Use   Smoking status: Never   Smokeless tobacco: Never   Tobacco comments:    Never smoked  Vaping Use   Vaping status: Never  Used  Substance and Sexual Activity   Alcohol use: Not Currently    Comment: occasional, maybe once/year   Drug use: No   Sexual activity: Not Currently    Birth control/protection: None  Other Topics Concern   Not on file  Social History Narrative   Not on file   Social Drivers of Health   Financial Resource Strain: Low Risk  (05/27/2023)   Overall Financial Resource Strain (CARDIA)    Difficulty of Paying Living Expenses: Not hard at all  Food Insecurity: No Food Insecurity (05/27/2023)   Hunger Vital Sign    Worried About Running Out of Food in the Last Year: Never true    Ran Out of Food in the Last Year: Never true  Transportation Needs: No Transportation Needs (05/27/2023)   PRAPARE - Administrator, Civil Service (Medical): No    Lack of Transportation (Non-Medical): No  Physical Activity: Sufficiently Active (05/27/2023)   Exercise Vital Sign    Days of Exercise per Week: 5 days    Minutes of Exercise per Session: 30 min  Stress: No Stress Concern Present (05/27/2023)   [employer] of Occupational Health - Occupational Stress Questionnaire    Feeling of Stress : Not at all  Social Connections: Moderately Integrated (05/27/2023)   Social Connection and Isolation Panel [NHANES]    Frequency of Communication with Friends and Family: More than three times a week    Frequency of Social Gatherings with Friends and Family: Three times a week    Attends Religious Services: More than 4 times per year    Active Member of Clubs or Organizations: Yes    Attends Banker Meetings: More than 4 times per year    Marital Status: Divorced     Review of Systems   Gen: Denies fever, chills, anorexia.  Denies fatigue, weakness, weight loss.  CV: Denies chest pain, palpitations, syncope, peripheral edema, and claudication. Resp: Denies dyspnea at rest, cough, wheezing, coughing up blood, and pleurisy. GI: See HPI Derm: Denies rash, itching, dry skin Psych: Denies depression, anxiety, memory loss, confusion. No homicidal or suicidal ideation.  Heme: Denies bruising, bleeding, and enlarged lymph nodes.  Physical Exam   BP 126/80   Pulse 82   Temp 98.2 F (36.8 C)   Ht 4\' 10"  (1.473 m)   Wt 152 lb 9.6 oz (69.2 kg)   BMI 31.89 kg/m   General:   Alert and oriented. No distress noted. Pleasant and cooperative.  Head:  Normocephalic and atraumatic. Eyes:  Conjuctiva clear without scleral icterus. Mouth:  Oral mucosa pink and moist. Good dentition. No lesions. Abdomen:  +BS, soft, non-tender and non-distended.  Mildly TTP to LUQ. no rebound or guarding. No HSM or masses noted. Rectal: deferred Msk:  Symmetrical without gross deformities. Normal posture. Extremities:  Without edema. Neurologic:  Alert and  oriented x4 Psych:  Alert and cooperative. Normal mood and affect.  Assessment  Kim Grimes is a 73 y.o. female with a history of H. pylori gastritis, GERD, constipation, hypothyroidism, CAD, anxiety, hiatal hernia presenting today with ongoing reflux, upper abdominal bulge, and constipation.  GERD:  - Observed " bubble" episodes in the office visit, belching -Denies sour taste, sleeping with head elevated -Continues to take PPI twice daily - Will give Carafate short-term to see if this helps with belching and reflux episodes - GERD diet reinforced, continue to sleep with head elevated - If symptoms continue to occur, may need to consider manometry  for possible esophageal spasms as cause of symptoms.  Diastasis Recti: - Notes tiny umbilical hernia present since 2003, not causing much issue currently. -Possible diastases recti but no significant finding on prior CT  scan -General Surgery offered repeat CTs however patient declined -Advised to consult primary care if symptoms persisted -Nothing appreciated on my exam today. -Provided education about diastasis recti and exercises  Constipation:  -Reporting thin stools, not much substance -Denies normal stool in " a while" -Colonoscopy in 2023 with 1 tubular adenoma removed -States MiraLAX does not work and does not like the side effects -Tried align in the past as well as Linzess with either poor relief or side effects - Advised to start Amitiza 8 mcg twice daily with food  PLAN   Continue Nexium 40mg  BID Carafate 1g QID Amitiza 8 mcg BID GERD diet Diastasis education and exercises provided May need to consider manometry for upper GI symptoms Follow up in 2 months.     Julian Obey, MSN, FNP-BC, AGACNP-BC [facility]

## 2023-10-08 ENCOUNTER — Other Ambulatory Visit: Payer: Self-pay | Admitting: Gastroenterology

## 2023-10-08 DIAGNOSIS — K5904 Chronic idiopathic constipation: Secondary | ICD-10-CM

## 2023-10-15 ENCOUNTER — Telehealth: Payer: Self-pay | Admitting: *Deleted

## 2023-10-15 NOTE — Telephone Encounter (Signed)
 Pt called and states that Amitiza is 254$ and she can't afford it. She states she is not having any problems with bowel movements at this time. She states she has been taking Align Probiotic. I informed her that I could try to get her patient assistance, but she states that she thinks she makes to much money, because she works a full time job and gets [employer]. Informed to call if she starts to have any issues.

## 2023-10-15 NOTE — Telephone Encounter (Signed)
error 

## 2023-11-02 ENCOUNTER — Encounter: Payer: Self-pay | Admitting: Gastroenterology

## 2023-11-05 DIAGNOSIS — H401131 Primary open-angle glaucoma, bilateral, mild stage: Secondary | ICD-10-CM | POA: Diagnosis not present

## 2023-12-14 DIAGNOSIS — H401131 Primary open-angle glaucoma, bilateral, mild stage: Secondary | ICD-10-CM | POA: Diagnosis not present

## 2023-12-30 ENCOUNTER — Encounter: Payer: Self-pay | Admitting: Internal Medicine

## 2023-12-30 ENCOUNTER — Ambulatory Visit (INDEPENDENT_AMBULATORY_CARE_PROVIDER_SITE_OTHER): Payer: Self-pay | Admitting: Internal Medicine

## 2023-12-30 VITALS — BP 116/77 | HR 88 | Ht <= 58 in | Wt 153.2 lb

## 2023-12-30 DIAGNOSIS — L739 Follicular disorder, unspecified: Secondary | ICD-10-CM | POA: Diagnosis not present

## 2023-12-30 DIAGNOSIS — L219 Seborrheic dermatitis, unspecified: Secondary | ICD-10-CM | POA: Diagnosis not present

## 2023-12-30 NOTE — Assessment & Plan Note (Signed)
 Scalp lesion in the occipital area appears to be folliculitis Advised to avoid draining it Can use warm compresses Referred to dermatology as per patient request

## 2023-12-30 NOTE — Progress Notes (Signed)
 Acute Office Visit  Subjective:    Patient ID: Kim Grimes, female    DOB: 1950-08-27, 73 y.o.   MRN: [id number]  Chief Complaint  Patient presents with   Cyst    PT reports sx of knot on the back of her head and the other spot is on the top of her head. Noticed this 1 month ago.     HPI Patient is in today for evaluation of a scalp lesion, that she has noticed for the last 1 month.  She has mild soreness in the area.  Her hairstylist had tried to take some drain it, but has been recurrent since then.  She denies any local bleeding or puslike discharge currently.  She also reports noticing an erythematous patch on the scalp in the parietal area in the past, but denies patchy hair loss or itching.  Past Medical History:  Diagnosis Date   Anxiety disorder    Carotid artery stenosis    Chronic pelvic pain in female    Coronary artery disease    Endometriosis    GERD (gastroesophageal reflux disease)    Helicobacter pylori gastritis    Dr. Obie    Hiatal hernia 2003   on UGI    Hypothyroidism 2009   IBS (irritable bowel syndrome) 2003   Obesity (BMI 30-39.9) DEC 2011 145 LBS   Osteoporosis    PONV (postoperative nausea and vomiting)     Past Surgical History:  Procedure Laterality Date   ABDOMINAL HYSTERECTOMY     adhesiolysis     dense adhesion between rectum and sigmoid,  & pelvis    BIOPSY  05/20/2022   Procedure: BIOPSY;  Surgeon: Cindie Carlin POUR, DO;  [facility];  Service: Endoscopy;;   BRAVO PH STUDY  01/19/2012   Procedure: BRAVO PH STUDY;  Surgeon: Margo LITTIE Haddock, MD;  [facility];  Service: Endoscopy;;   CATARACT EXTRACTION W/PHACO Left 06/03/2019   Procedure: CATARACT EXTRACTION PHACO AND INTRAOCULAR LENS PLACEMENT LEFT EYE (CDE: 2.92);  Surgeon: Harrie Agent, MD;  Location: [facility];  Service: Ophthalmology;  Laterality: Left;   COLONOSCOPY  03: [facility]/D & 08: SCREENING   WNL'S   COLONOSCOPY  07/28/2011   sessile polyp in ascending  colon/internal hemorrhoids. Next colonoscopy January 2018   COLONOSCOPY N/A 08/01/2016   Procedure: COLONOSCOPY;  Surgeon: Margo LITTIE Haddock, MD;  [facility];  Service: Endoscopy;  Laterality: N/A;  930    COLONOSCOPY WITH PROPOFOL  N/A 12/24/2021   Procedure: COLONOSCOPY WITH PROPOFOL ;  Surgeon: Cindie Carlin POUR, DO;  [facility];  Service: Endoscopy;  Laterality: N/A;  9:00am   ESOPHAGOGASTRODUODENOSCOPY  01/19/2012   SLF: SMALL Hiatal hernia/Mild gastritis   ESOPHAGOGASTRODUODENOSCOPY (EGD) WITH PROPOFOL  N/A 05/20/2022   Procedure: ESOPHAGOGASTRODUODENOSCOPY (EGD) WITH PROPOFOL ;  Surgeon: Cindie Carlin POUR, DO;  [facility];  Service: Endoscopy;  Laterality: N/A;  12:45 pm, pt knows to arrive at 7:00   EYE SURGERY Right 10/18/2013   catarat extraction   LAPAROSCOPY     OLECRANON BURSECTOMY Left 07/01/2018   Procedure: LEFT ELBOW OLECRANON BURSECTOMY AND SPUR EXCISION;  Surgeon: Margrette Taft BRAVO, MD;  Location: [facility];  Service: Orthopedics;  Laterality: Left;   POLYPECTOMY  12/24/2021   Procedure: POLYPECTOMY INTESTINAL;  Surgeon: Cindie Carlin POUR, DO;  [facility];  Service: Endoscopy;;   rt foot sx Bilateral    s/p hysterectomy     SALPINGOOPHORECTOMY     UPPER  GASTROINTESTINAL ENDOSCOPY  DEC 2010: DYSPEPSIA/DYSPHAGIA   SCH RING/ESO DIL 16 MM, GASTRITIS/DUODENITIS 2o to Aleve /on OMP PRN    Family History  Problem Relation Age of Onset   Prostate cancer Father    Colon cancer Father        < 49 YO   Cancer Father        prostate and colon   Pancreatic cancer Mother    Cancer Mother        pancreas   Diabetes Mother    Diabetes Brother    Cancer Brother        prostate    Diabetes Sister        prediabetic   Heart disease Brother        CHF   Thyroid  disease Neg Hx     Social History   Socioeconomic History   Marital status: Divorced    Spouse name: Not on file   Number of children: Not on file   Years of education: Not  on file   Highest education level: Not on file  Occupational History   Occupation: fulltime at [employer]   Tobacco Use   Smoking status: Never   Smokeless tobacco: Never   Tobacco comments:    Never smoked  Vaping Use   Vaping status: Never Used  Substance and Sexual Activity   Alcohol use: Not Currently    Comment: occasional, maybe once/year   Drug use: No   Sexual activity: Not Currently    Birth control/protection: None  Other Topics Concern   Not on file  Social History Narrative   Not on file   Social Drivers of Health   Financial Resource Strain: Low Risk  (05/27/2023)   Overall Financial Resource Strain (CARDIA)    Difficulty of Paying Living Expenses: Not hard at all  Food Insecurity: No Food Insecurity (05/27/2023)   Hunger Vital Sign    Worried About Running Out of Food in the Last Year: Never true    Ran Out of Food in the Last Year: Never true  Transportation Needs: No Transportation Needs (05/27/2023)   PRAPARE - Administrator, Civil Service (Medical): No    Lack of Transportation (Non-Medical): No  Physical Activity: Sufficiently Active (05/27/2023)   Exercise Vital Sign    Days of Exercise per Week: 5 days    Minutes of Exercise per Session: 30 min  Stress: No Stress Concern Present (05/27/2023)   [employer] of Occupational Health - Occupational Stress Questionnaire    Feeling of Stress : Not at all  Social Connections: Moderately Integrated (05/27/2023)   Social Connection and Isolation Panel    Frequency of Communication with Friends and Family: More than three times a week    Frequency of Social Gatherings with Friends and Family: Three times a week    Attends Religious Services: More than 4 times per year    Active Member of Clubs or Organizations: Yes    Attends Banker Meetings: More than 4 times per year    Marital Status: Divorced  Intimate Partner Violence: Not At Risk (05/27/2023)   Humiliation,  Afraid, Rape, and Kick questionnaire    Fear of Current or Ex-Partner: No    Emotionally Abused: No    Physically Abused: No    Sexually Abused: No    Outpatient Medications Prior to Visit  Medication Sig Dispense Refill   azelastine  (ASTELIN ) 0.1 % nasal spray Place 1 spray into both nostrils 2 (  two) times daily as needed for rhinitis. Use in each nostril as directed     esomeprazole  (NEXIUM ) 40 MG capsule TAKE 1 CAPSULE (40 MG TOTAL) BY MOUTH 2 (TWO) TIMES DAILY BEFORE A MEAL. 180 capsule 1   levothyroxine  (SYNTHROID ) 50 MCG tablet Take 1 tablet (50 mcg total) by mouth every other day. 45 tablet 3   levothyroxine  (SYNTHROID ) 75 MCG tablet Take 1 tablet (75 mcg total) by mouth every other day. 45 tablet 3   lubiprostone  (AMITIZA ) 8 MCG capsule Take 1 capsule (8 mcg total) by mouth 2 (two) times daily with a meal. 180 capsule 1   montelukast  (SINGULAIR ) 10 MG tablet TAKE 1 TABLET BY MOUTH EVERYDAY AT BEDTIME 90 tablet 1   sucralfate  (CARAFATE ) 1 g tablet Take 1 tablet (1 g total) by mouth 4 (four) times daily -  with meals and at bedtime. 120 tablet 0   No facility-administered medications prior to visit.    Allergies  Allergen Reactions   Aspirin Other (See Comments)    blood rushing   Diazepam  Other (See Comments)    Agitation   Prednisone Other (See Comments)    Any type of steroids. Pt states she has seen what they do to other people and she just doesn't take them    Review of Systems  Constitutional:  Negative for chills and fever.  HENT:  Negative for congestion, sinus pressure, sinus pain and sore throat.   Eyes:  Negative for pain and discharge.  Respiratory:  Negative for cough and shortness of breath.   Cardiovascular:  Negative for chest pain and palpitations.  Gastrointestinal:  Negative for abdominal pain, diarrhea, nausea and vomiting.  Endocrine: Negative for polydipsia and polyuria.  Genitourinary:  Negative for dysuria and hematuria.  Musculoskeletal:   Negative for neck pain and neck stiffness.  Skin:  Negative for rash.       Skin bump over occipital area  Neurological:  Negative for dizziness and weakness.  Psychiatric/Behavioral:  Negative for agitation and behavioral problems.        Objective:    Physical Exam Constitutional:      General: She is not in acute distress.    Appearance: She is not diaphoretic.  HENT:     Head: Normocephalic and atraumatic.     Nose: No congestion.     Mouth/Throat:     Mouth: Mucous membranes are moist.     Pharynx: No posterior oropharyngeal erythema.  Skin:    Findings: Lesion (About 1 cm in diameter lesion over occipital area, no surrounding erythema) present.  Neurological:     General: No focal deficit present.     Mental Status: She is alert and oriented to person, place, and time.  Psychiatric:        Mood and Affect: Mood normal.        Behavior: Behavior normal.     BP 116/77   Pulse 88   Ht 4' 10 (1.473 m)   Wt 153 lb 3.2 oz (69.5 kg)   SpO2 95%   BMI 32.02 kg/m  Wt Readings from Last 3 Encounters:  12/30/23 153 lb 3.2 oz (69.5 kg)  10/05/23 152 lb 9.6 oz (69.2 kg)  09/23/23 152 lb (68.9 kg)        Assessment & Plan:   Problem List Items Addressed This Visit       Musculoskeletal and Integument   Folliculitis - Primary   Scalp lesion in the occipital area appears to be folliculitis  Advised to avoid draining it Can use warm compresses Referred to dermatology as per patient request      Relevant Orders   Ambulatory referral to Dermatology   Seborrheic dermatitis   Patchy erythema in the scalp area could be suberic dermatitis, although improved currently If recurrent, can use OTC antifungal shampoo If persistent, can prescribe ketoconazole  shampoo        No orders of the defined types were placed in this encounter.    Suzzane MARLA Blanch, MD

## 2023-12-30 NOTE — Assessment & Plan Note (Signed)
 Patchy erythema in the scalp area could be suberic dermatitis, although improved currently If recurrent, can use OTC antifungal shampoo If persistent, can prescribe ketoconazole  shampoo

## 2024-01-18 DIAGNOSIS — H401131 Primary open-angle glaucoma, bilateral, mild stage: Secondary | ICD-10-CM | POA: Diagnosis not present

## 2024-01-20 ENCOUNTER — Other Ambulatory Visit: Payer: Self-pay | Admitting: Internal Medicine

## 2024-01-20 ENCOUNTER — Ambulatory Visit (INDEPENDENT_AMBULATORY_CARE_PROVIDER_SITE_OTHER): Admitting: Internal Medicine

## 2024-01-20 ENCOUNTER — Encounter: Payer: Self-pay | Admitting: Internal Medicine

## 2024-01-20 VITALS — BP 123/74 | HR 80 | Temp 97.9°F | Ht 59.0 in | Wt 153.5 lb

## 2024-01-20 DIAGNOSIS — K219 Gastro-esophageal reflux disease without esophagitis: Secondary | ICD-10-CM

## 2024-01-20 DIAGNOSIS — M6208 Separation of muscle (nontraumatic), other site: Secondary | ICD-10-CM

## 2024-01-20 DIAGNOSIS — K5900 Constipation, unspecified: Secondary | ICD-10-CM | POA: Diagnosis not present

## 2024-01-20 DIAGNOSIS — K5904 Chronic idiopathic constipation: Secondary | ICD-10-CM

## 2024-01-20 DIAGNOSIS — R109 Unspecified abdominal pain: Secondary | ICD-10-CM

## 2024-01-20 DIAGNOSIS — K449 Diaphragmatic hernia without obstruction or gangrene: Secondary | ICD-10-CM

## 2024-01-20 MED ORDER — ESOMEPRAZOLE MAGNESIUM 40 MG PO CPDR
40.0000 mg | DELAYED_RELEASE_CAPSULE | Freq: Two times a day (BID) | ORAL | 3 refills | Status: DC
Start: 2024-01-20 — End: 2024-01-20

## 2024-01-20 NOTE — Progress Notes (Signed)
 Referring Provider: Antonetta Rollene BRAVO, MD Primary Care Physician:  Antonetta Rollene BRAVO, MD Primary GI:  Dr. Cindie  Chief Complaint  Patient presents with   Gastroesophageal Reflux    Patient here today due to issues with Kim Grimes, and a burning sensation in mid abdomen. She is taking Esomeprazole  40 mg bid. She says she had been just taking one per day before breakfast. She says she increased around a week ago. She says since increasing the medication to bid this has gotten better.     HPI:   Kim Grimes is a 73 y.o. female who presents to clinic today for follow-up visit.  She has a history of chronic GERD, hiatal hernia, H. pylori gastritis, constipation, chronic abdominal pain.  Previous workup below:  EGD 01/19/2012: -Small hernia -Gastritis -Fundic gland polyp and biopsies negative for H. pylori   Colonoscopy 07/2016: -2 polyps at the hepatic flexure and mid ascending colon -Internal hemorrhoids -Redundant left colon -Repeat colonoscopy in 5 years   EGD 04/2022: -Small hiatal hernia -Scattered mild inflammation characterized by erythema in the gastric body -Multiple 3-8 mm sessile polyps with no bleeding -Pathology revealed benign fundic gland polyps, gastric biopsy negative for H. Pylori   Colonoscopy 12/24/2021 for surveillance: -1 tubular adenoma removed -Due for repeat in 2028  GERD: Previously trialed and failed lansoprazole , omeprazole , currently on esomeprazole  40 mg.  Supposed to be taking twice daily though this only been taking once daily.  Dexilant  worked the best though cost has been an issue.  Was given samples of Voquezna but did not take this after reading side effect profile.  Constipation relatively well-controlled.  Insurance did not cover Amitiza .  Currently on align.  Past Medical History:  Diagnosis Date   Anxiety disorder    Carotid artery stenosis    Chronic pelvic pain in female    Coronary artery disease    Endometriosis    GERD  (gastroesophageal reflux disease)    Helicobacter pylori gastritis    Dr. Obie    Hiatal hernia 2003   on UGI    Hypothyroidism 2009   IBS (irritable bowel syndrome) 2003   Obesity (BMI 30-39.9) DEC 2011 145 LBS   Osteoporosis    PONV (postoperative nausea and vomiting)     Past Surgical History:  Procedure Laterality Date   ABDOMINAL HYSTERECTOMY     adhesiolysis     dense adhesion between rectum and sigmoid,  & pelvis    BIOPSY  05/20/2022   Procedure: BIOPSY;  Surgeon: Cindie Carlin POUR, DO;  [facility];  Service: Endoscopy;;   BRAVO PH STUDY  01/19/2012   Procedure: BRAVO PH STUDY;  Surgeon: Margo LITTIE Haddock, MD;  [facility];  Service: Endoscopy;;   CATARACT EXTRACTION W/PHACO Left 06/03/2019   Procedure: CATARACT EXTRACTION PHACO AND INTRAOCULAR LENS PLACEMENT LEFT EYE (CDE: 2.92);  Surgeon: Harrie Agent, MD;  [facility];  Service: Ophthalmology;  Laterality: Left;   COLONOSCOPY  03: AP/D & 08: SCREENING   WNL'S   COLONOSCOPY  07/28/2011   sessile polyp in ascending colon/internal hemorrhoids. Next colonoscopy January 2018   COLONOSCOPY N/A 08/01/2016   Procedure: COLONOSCOPY;  Surgeon: Margo LITTIE Haddock, MD;  [facility];  Service: Endoscopy;  Laterality: N/A;  930    COLONOSCOPY WITH PROPOFOL  N/A 12/24/2021   Procedure: COLONOSCOPY WITH PROPOFOL ;  Surgeon: Cindie Carlin POUR, DO;  [facility];  Service: Endoscopy;  Laterality: N/A;  9:00am  ESOPHAGOGASTRODUODENOSCOPY  01/19/2012   SLF: SMALL Hiatal hernia/Mild gastritis   ESOPHAGOGASTRODUODENOSCOPY (EGD) WITH PROPOFOL  N/A 05/20/2022   Procedure: ESOPHAGOGASTRODUODENOSCOPY (EGD) WITH PROPOFOL ;  Surgeon: Cindie Carlin POUR, DO;  [facility];  Service: Endoscopy;  Laterality: N/A;  12:45 pm, pt knows to arrive at 7:00   EYE SURGERY Right 10/18/2013   catarat extraction   LAPAROSCOPY     OLECRANON BURSECTOMY Left 07/01/2018   Procedure: LEFT ELBOW OLECRANON BURSECTOMY  AND SPUR EXCISION;  Surgeon: Margrette Taft BRAVO, MD;  [facility];  Service: Orthopedics;  Laterality: Left;   POLYPECTOMY  12/24/2021   Procedure: POLYPECTOMY INTESTINAL;  Surgeon: Cindie Carlin POUR, DO;  [facility];  Service: Endoscopy;;   rt foot sx Bilateral    s/p hysterectomy     SALPINGOOPHORECTOMY     UPPER GASTROINTESTINAL ENDOSCOPY  DEC 2010: DYSPEPSIA/DYSPHAGIA   SCH RING/ESO DIL 16 MM, GASTRITIS/DUODENITIS 2o to Aleve /on OMP PRN    Current Outpatient Medications  Medication Sig Dispense Refill   azelastine  (ASTELIN ) 0.1 % nasal spray Place 1 spray into both nostrils 2 (two) times daily as needed for rhinitis. Use in each nostril as directed     esomeprazole  (NEXIUM ) 40 MG capsule TAKE 1 CAPSULE (40 MG TOTAL) BY MOUTH 2 (TWO) TIMES DAILY BEFORE A MEAL. 180 capsule 1   levothyroxine  (SYNTHROID ) 50 MCG tablet Take 1 tablet (50 mcg total) by mouth every other day. 45 tablet 3   levothyroxine  (SYNTHROID ) 75 MCG tablet Take 1 tablet (75 mcg total) by mouth every other day. 45 tablet 3   montelukast  (SINGULAIR ) 10 MG tablet TAKE 1 TABLET BY MOUTH EVERYDAY AT BEDTIME 90 tablet 1   lubiprostone  (AMITIZA ) 8 MCG capsule Take 1 capsule (8 mcg total) by mouth 2 (two) times daily with a meal. (Patient not taking: Reported on 01/20/2024) 180 capsule 1   sucralfate  (CARAFATE ) 1 g tablet Take 1 tablet (1 g total) by mouth 4 (four) times daily -  with meals and at bedtime. (Patient not taking: Reported on 01/20/2024) 120 tablet 0   No current facility-administered medications for this visit.    Allergies as of 01/20/2024 - Review Complete 01/20/2024  Allergen Reaction Noted   Aspirin Other (See Comments) 05/09/2014   Diazepam  Other (See Comments)    Prednisone Other (See Comments) 02/21/2020    Family History  Problem Relation Age of Onset   Prostate cancer Father    Colon cancer Father        < 29 YO   Cancer Father        prostate and colon   Pancreatic cancer Mother     Cancer Mother        pancreas   Diabetes Mother    Diabetes Brother    Cancer Brother        prostate    Diabetes Sister        prediabetic   Heart disease Brother        CHF   Thyroid  disease Neg Hx     Social History   Socioeconomic History   Marital status: Divorced    Spouse name: Not on file   Number of children: Not on file   Years of education: Not on file   Highest education level: Not on file  Occupational History   Occupation: fulltime at [employer]   Tobacco Use   Smoking status: Never   Smokeless tobacco: Never   Tobacco comments:    Never smoked  Vaping Use   Vaping status: Never Used  Substance and Sexual Activity   Alcohol use: Not Currently    Comment: occasional, maybe once/year   Drug use: No   Sexual activity: Not Currently    Birth control/protection: None  Other Topics Concern   Not on file  Social History Narrative   Not on file   Social Drivers of Health   Financial Resource Strain: Low Risk  (05/27/2023)   Overall Financial Resource Strain (CARDIA)    Difficulty of Paying Living Expenses: Not hard at all  Food Insecurity: No Food Insecurity (05/27/2023)   Hunger Vital Sign    Worried About Running Out of Food in the Last Year: Never true    Ran Out of Food in the Last Year: Never true  Transportation Needs: No Transportation Needs (05/27/2023)   PRAPARE - Administrator, Civil Service (Medical): No    Lack of Transportation (Non-Medical): No  Physical Activity: Sufficiently Active (05/27/2023)   Exercise Vital Sign    Days of Exercise per Week: 5 days    Minutes of Exercise per Session: 30 min  Stress: No Stress Concern Present (05/27/2023)   [employer] of Occupational Health - Occupational Stress Questionnaire    Feeling of Stress : Not at all  Social Connections: Moderately Integrated (05/27/2023)   Social Connection and Isolation Panel    Frequency of Communication with Friends and Family: More  than three times a week    Frequency of Social Gatherings with Friends and Family: Three times a week    Attends Religious Services: More than 4 times per year    Active Member of Clubs or Organizations: Yes    Attends Banker Meetings: More than 4 times per year    Marital Status: Divorced    Subjective: Review of Systems  Constitutional:  Negative for chills and fever.  HENT:  Negative for congestion and hearing loss.   Eyes:  Negative for blurred vision and double vision.  Respiratory:  Negative for cough and shortness of breath.   Cardiovascular:  Negative for chest pain and palpitations.  Gastrointestinal:  Negative for abdominal pain, blood in stool, constipation, diarrhea, heartburn, melena and vomiting.  Genitourinary:  Negative for dysuria and urgency.  Musculoskeletal:  Negative for joint pain and myalgias.  Skin:  Negative for itching and rash.  Neurological:  Negative for dizziness and headaches.  Psychiatric/Behavioral:  Negative for depression. The patient is not nervous/anxious.      Objective: BP 123/74 (BP Location: Left Arm, Patient Position: Sitting, Cuff Size: Normal)   Pulse 80   Temp 97.9 F (36.6 C) (Oral)   Ht 4' 11 (1.499 m)   Wt 153 lb 8 oz (69.6 kg)   BMI 31.00 kg/m  Physical Exam Constitutional:      Appearance: Normal appearance.  HENT:     Head: Normocephalic and atraumatic.  Eyes:     Extraocular Movements: Extraocular movements intact.     Conjunctiva/sclera: Conjunctivae normal.  Cardiovascular:     Rate and Rhythm: Normal rate and regular rhythm.  Pulmonary:     Effort: Pulmonary effort is normal.     Breath sounds: Normal breath sounds.  Abdominal:     General: Bowel sounds are normal.     Palpations: Abdomen is soft.  Musculoskeletal:        General: No swelling. Normal range of motion.     Cervical back: Normal range of motion and neck supple.  Skin:  General: Skin is warm and dry.     Coloration: Skin is not  jaundiced.  Neurological:     General: No focal deficit present.     Mental Status: She is alert and oriented to person, place, and time.  Psychiatric:        Mood and Affect: Mood normal.        Behavior: Behavior normal.      Assessment/Plan:  1.  Chronic GERD-previously trialed and failed lansoprazole , omeprazole .  Dexilant  worked the best the cost is an issue.  Did not try samples of Voquezna after reading potential side effect profile.  Currently on esomeprazole  once daily, supposed be taking twice daily.  Counseled her to take twice daily and see how she does.  2.  Constipation-well-controlled, continue on align.  3.  Abdominal pain, diastases recti-CT abdomen pelvis reassuring.  Likely musculoskeletal as patient is quite active.  Will continue to monitor for now.  She does note this is feeling better as of late.  Has met with general surgery who offered repeat CT however patient declined.  Continue to monitor.  Follow-up in 3 to 4 months.  01/20/2024 3:10 PM   Disclaimer: This note was dictated with voice recognition software. Similar sounding words can inadvertently be transcribed and may not be corrected upon review.

## 2024-01-20 NOTE — Patient Instructions (Signed)
 Continue on esomeprazole  twice daily for your chronic acid reflux.  I have sent in a year supply refills today.  Follow-up in 3 months.  It is always a pleasure seeing you.  Dr. Cindie

## 2024-01-21 ENCOUNTER — Other Ambulatory Visit: Payer: Self-pay | Admitting: Gastroenterology

## 2024-01-21 DIAGNOSIS — H43811 Vitreous degeneration, right eye: Secondary | ICD-10-CM | POA: Diagnosis not present

## 2024-01-21 DIAGNOSIS — K219 Gastro-esophageal reflux disease without esophagitis: Secondary | ICD-10-CM

## 2024-01-21 DIAGNOSIS — H524 Presbyopia: Secondary | ICD-10-CM | POA: Diagnosis not present

## 2024-01-22 DIAGNOSIS — M25662 Stiffness of left knee, not elsewhere classified: Secondary | ICD-10-CM | POA: Diagnosis not present

## 2024-01-22 DIAGNOSIS — R262 Difficulty in walking, not elsewhere classified: Secondary | ICD-10-CM | POA: Diagnosis not present

## 2024-01-22 DIAGNOSIS — M25562 Pain in left knee: Secondary | ICD-10-CM | POA: Diagnosis not present

## 2024-01-22 DIAGNOSIS — M1712 Unilateral primary osteoarthritis, left knee: Secondary | ICD-10-CM | POA: Diagnosis not present

## 2024-01-25 ENCOUNTER — Telehealth: Payer: Self-pay | Admitting: Orthopedic Surgery

## 2024-01-25 NOTE — Telephone Encounter (Signed)
 Patient called and wants to know to ask a medical question. [phone]

## 2024-01-25 NOTE — Telephone Encounter (Signed)
 I called her she is asking about a procedure for her knee arthritis pain clinic can do it if you think it will help its a GAE / Genicular Artery Embolization (GAE): she wants to know If you think it will help her

## 2024-01-25 NOTE — Telephone Encounter (Signed)
 Thanks. I have advised her she voiced understanding.

## 2024-02-19 ENCOUNTER — Encounter: Payer: Self-pay | Admitting: Radiology

## 2024-02-22 ENCOUNTER — Telehealth: Payer: Self-pay | Admitting: Family Medicine

## 2024-02-22 DIAGNOSIS — Z1231 Encounter for screening mammogram for malignant neoplasm of breast: Secondary | ICD-10-CM | POA: Diagnosis not present

## 2024-02-22 NOTE — Telephone Encounter (Signed)
 Referral redirected to: [facility] [address] [place], [place] - [place] [address] [phone]   No new referral needed at this time.

## 2024-02-22 NOTE — Telephone Encounter (Signed)
 Patient came by the office, Dr Norleen Hurst dermatologist does not accept her insurance, contact patient at [phone] for another referral to a dermatologist

## 2024-02-24 NOTE — Telephone Encounter (Signed)
 FYI:  Phoned and advised the pt that her insurance will not pay for her to take  2 a day of Esomeprazole . I thought maybe you could do a letter to them (because there are times Kim Grimes will do a letter for a pt) but the pt told me not to worry about it because in October / November she is switching back to her insurance she had before now.

## 2024-03-01 ENCOUNTER — Ambulatory Visit: Payer: Self-pay

## 2024-03-01 NOTE — Telephone Encounter (Signed)
 FYI Only or Action Required?: Action required by provider: Pt has appt for ENT - but it is far off..  Patient was last seen in primary care on 12/30/2023 by Tobie Suzzane POUR, MD.  Called Nurse Triage reporting Otalgia.  Symptoms began Ongoing ear issues. Sharp pain is new.  Interventions attempted: Nothing.  Symptoms are: gradually worsening.  Triage Disposition: See Within 3 Days in Office  Patient/caregiver understands and will follow disposition?: Yes               Copied from CRM #[id number]. Topic: Clinical - Red Word Triage >> Mar 01, 2024  9:15 AM Donna BRAVO wrote: Red Word that prompted transfer to Nurse Triage: patient has spatic right ear pain that lasts for a few seconds, there is no rhyme or reason, it just happens and happening more frequently      ----------------------------------------------------------------------- From previous Reason for Contact - Pink Word Triage: Reason for Triage: Reason for Disposition  Earache lasts < 60 minutes  Answer Assessment - Initial Assessment Questions 1. LOCATION: Which ear is involved?     Right pain 2. ONSET: When did the ear pain start?      ongoing 3. SEVERITY: How bad is the pain?  (Scale 1-10; mild, moderate or severe)     Sharp - but brief pain - last for 6 seconds 4. URI SYMPTOMS: Do you have a runny nose or cough?     cough 5. FEVER: Do you have a fever? If Yes, ask: What is your temperature, how was it measured, and when did it start?     no 6. CAUSE: Have you been swimming recently?, How often do you use Q-TIPS?, Have you had any recent air travel or scuba diving?     no 7. OTHER SYMPTOMS: Do you have any other symptoms? (e.g., decreased hearing, dizziness, headache, stiff neck, vomiting)     cough  Protocols used: Earache-A-AH

## 2024-03-01 NOTE — Telephone Encounter (Signed)
 Appt made.

## 2024-03-03 ENCOUNTER — Encounter: Payer: Self-pay | Admitting: Internal Medicine

## 2024-03-03 ENCOUNTER — Ambulatory Visit (INDEPENDENT_AMBULATORY_CARE_PROVIDER_SITE_OTHER): Payer: Self-pay | Admitting: Internal Medicine

## 2024-03-03 VITALS — BP 130/76 | HR 79 | Ht 59.0 in | Wt 154.0 lb

## 2024-03-03 DIAGNOSIS — J309 Allergic rhinitis, unspecified: Secondary | ICD-10-CM | POA: Diagnosis not present

## 2024-03-03 DIAGNOSIS — H6504 Acute serous otitis media, recurrent, right ear: Secondary | ICD-10-CM | POA: Insufficient documentation

## 2024-03-03 MED ORDER — OFLOXACIN 0.3 % OT SOLN
5.0000 [drp] | Freq: Every day | OTIC | 0 refills | Status: DC
Start: 2024-03-03 — End: 2024-05-10

## 2024-03-03 NOTE — Assessment & Plan Note (Signed)
 Continue azelastine  nasal spray Continue Singulair  Advised to use sinus inhaler or vaporizer as needed for nasal congestion

## 2024-03-03 NOTE — Patient Instructions (Signed)
 Please apply Ofloxacin  ear drop as prescribed.  Please avoid using any sharp objects for cleaning purposes.

## 2024-03-03 NOTE — Progress Notes (Signed)
 Acute Office Visit  Subjective:    Patient ID: Kim Grimes, female    DOB: Aug 30, 1950, 73 y.o.   MRN: [id number]  Chief Complaint  Patient presents with   Ear Pain    Right ear pain ongoing for 3 weeks or longer.     HPI Patient is in today for complaint of right ear pain for the last 3 weeks.  Her pain is intermittent, sharp, and nonradiating.  She has noticed left ear pain for the last 2 days on intermittent basis as well.  She has mild nasal congestion and postnasal drip, but denies sore throat, fever, chills, dyspnea or wheezing.  She has history of otitis media in the past.  She tried to contact her ENT specialist, and has an appointment in the next month.  She uses Astelin  nasal spray and takes Singulair  for allergies.  Past Medical History:  Diagnosis Date   Anxiety disorder    Carotid artery stenosis    Chronic pelvic pain in female    Coronary artery disease    Endometriosis    GERD (gastroesophageal reflux disease)    Helicobacter pylori gastritis    Dr. Obie    Hiatal hernia 2003   on UGI    Hypothyroidism 2009   IBS (irritable bowel syndrome) 2003   Obesity (BMI 30-39.9) DEC 2011 145 LBS   Osteoporosis    PONV (postoperative nausea and vomiting)     Past Surgical History:  Procedure Laterality Date   ABDOMINAL HYSTERECTOMY     adhesiolysis     dense adhesion between rectum and sigmoid,  & pelvis    BIOPSY  05/20/2022   Procedure: BIOPSY;  Surgeon: Cindie Carlin POUR, DO;  [facility];  Service: Endoscopy;;   BRAVO PH STUDY  01/19/2012   Procedure: BRAVO PH STUDY;  Surgeon: Margo LITTIE Haddock, MD;  [facility];  Service: Endoscopy;;   CATARACT EXTRACTION W/PHACO Left 06/03/2019   Procedure: CATARACT EXTRACTION PHACO AND INTRAOCULAR LENS PLACEMENT LEFT EYE (CDE: 2.92);  Surgeon: Harrie Agent, MD;  Location: [facility];  Service: Ophthalmology;  Laterality: Left;   COLONOSCOPY  03: AP/D & 08: SCREENING   WNL'S   COLONOSCOPY  07/28/2011    sessile polyp in ascending colon/internal hemorrhoids. Next colonoscopy January 2018   COLONOSCOPY N/A 08/01/2016   Procedure: COLONOSCOPY;  Surgeon: Margo LITTIE Haddock, MD;  [facility];  Service: Endoscopy;  Laterality: N/A;  930    COLONOSCOPY WITH PROPOFOL  N/A 12/24/2021   Procedure: COLONOSCOPY WITH PROPOFOL ;  Surgeon: Cindie Carlin POUR, DO;  [facility];  Service: Endoscopy;  Laterality: N/A;  9:00am   ESOPHAGOGASTRODUODENOSCOPY  01/19/2012   SLF: SMALL Hiatal hernia/Mild gastritis   ESOPHAGOGASTRODUODENOSCOPY (EGD) WITH PROPOFOL  N/A 05/20/2022   Procedure: ESOPHAGOGASTRODUODENOSCOPY (EGD) WITH PROPOFOL ;  Surgeon: Cindie Carlin POUR, DO;  [facility];  Service: Endoscopy;  Laterality: N/A;  12:45 pm, pt knows to arrive at 7:00   EYE SURGERY Right 10/18/2013   catarat extraction   LAPAROSCOPY     OLECRANON BURSECTOMY Left 07/01/2018   Procedure: LEFT ELBOW OLECRANON BURSECTOMY AND SPUR EXCISION;  Surgeon: Margrette Taft BRAVO, MD;  Location: [facility];  Service: Orthopedics;  Laterality: Left;   POLYPECTOMY  12/24/2021   Procedure: POLYPECTOMY INTESTINAL;  Surgeon: Cindie Carlin POUR, DO;  [facility];  Service: Endoscopy;;   rt foot sx Bilateral    s/p hysterectomy     SALPINGOOPHORECTOMY     UPPER  GASTROINTESTINAL ENDOSCOPY  DEC 2010: DYSPEPSIA/DYSPHAGIA   SCH RING/ESO DIL 16 MM, GASTRITIS/DUODENITIS 2o to Aleve /on OMP PRN    Family History  Problem Relation Age of Onset   Prostate cancer Father    Colon cancer Father        < 75 YO   Cancer Father        prostate and colon   Pancreatic cancer Mother    Cancer Mother        pancreas   Diabetes Mother    Diabetes Brother    Cancer Brother        prostate    Diabetes Sister        prediabetic   Heart disease Brother        CHF   Thyroid  disease Neg Hx     Social History   Socioeconomic History   Marital status: Divorced    Spouse name: Not on file   Number of children: Not on  file   Years of education: Not on file   Highest education level: Not on file  Occupational History   Occupation: fulltime at [employer]   Tobacco Use   Smoking status: Never   Smokeless tobacco: Never   Tobacco comments:    Never smoked  Vaping Use   Vaping status: Never Used  Substance and Sexual Activity   Alcohol use: Not Currently    Comment: occasional, maybe once/year   Drug use: No   Sexual activity: Not Currently    Birth control/protection: None  Other Topics Concern   Not on file  Social History Narrative   Not on file   Social Drivers of Health   Financial Resource Strain: Low Risk  (05/27/2023)   Overall Financial Resource Strain (CARDIA)    Difficulty of Paying Living Expenses: Not hard at all  Food Insecurity: No Food Insecurity (05/27/2023)   Hunger Vital Sign    Worried About Running Out of Food in the Last Year: Never true    Ran Out of Food in the Last Year: Never true  Transportation Needs: No Transportation Needs (05/27/2023)   PRAPARE - Administrator, Civil Service (Medical): No    Lack of Transportation (Non-Medical): No  Physical Activity: Sufficiently Active (05/27/2023)   Exercise Vital Sign    Days of Exercise per Week: 5 days    Minutes of Exercise per Session: 30 min  Stress: No Stress Concern Present (05/27/2023)   [employer] of Occupational Health - Occupational Stress Questionnaire    Feeling of Stress : Not at all  Social Connections: Moderately Integrated (05/27/2023)   Social Connection and Isolation Panel    Frequency of Communication with Friends and Family: More than three times a week    Frequency of Social Gatherings with Friends and Family: Three times a week    Attends Religious Services: More than 4 times per year    Active Member of Clubs or Organizations: Yes    Attends Banker Meetings: More than 4 times per year    Marital Status: Divorced  Intimate Partner Violence: Not At  Risk (05/27/2023)   Humiliation, Afraid, Rape, and Kick questionnaire    Fear of Current or Ex-Partner: No    Emotionally Abused: No    Physically Abused: No    Sexually Abused: No    Outpatient Medications Prior to Visit  Medication Sig Dispense Refill   azelastine  (ASTELIN ) 0.1 % nasal spray Place 1 spray into both nostrils 2 (  two) times daily as needed for rhinitis. Use in each nostril as directed     esomeprazole  (NEXIUM ) 40 MG capsule TAKE 1 CAPSULE (40 MG TOTAL) BY MOUTH 2 (TWO) TIMES DAILY BEFORE A MEAL. 180 capsule 3   levothyroxine  (SYNTHROID ) 50 MCG tablet Take 1 tablet (50 mcg total) by mouth every other day. 45 tablet 3   levothyroxine  (SYNTHROID ) 75 MCG tablet Take 1 tablet (75 mcg total) by mouth every other day. 45 tablet 3   montelukast  (SINGULAIR ) 10 MG tablet TAKE 1 TABLET BY MOUTH EVERYDAY AT BEDTIME 90 tablet 1   No facility-administered medications prior to visit.    Allergies  Allergen Reactions   Aspirin Other (See Comments)    blood rushing   Diazepam  Other (See Comments)    Agitation   Prednisone Other (See Comments)    Any type of steroids. Pt states she has seen what they do to other people and she just doesn't take them    Review of Systems  Constitutional:  Negative for chills and fever.  HENT:  Positive for congestion, ear pain and postnasal drip. Negative for ear discharge and sore throat.   Eyes:  Negative for pain and discharge.  Respiratory:  Negative for cough and shortness of breath.   Cardiovascular:  Negative for chest pain and palpitations.  Gastrointestinal:  Negative for abdominal pain, diarrhea, nausea and vomiting.  Endocrine: Negative for polydipsia and polyuria.  Genitourinary:  Negative for dysuria and hematuria.  Musculoskeletal:  Negative for neck pain and neck stiffness.  Skin:  Negative for rash.  Neurological:  Negative for dizziness and weakness.  Psychiatric/Behavioral:  Negative for agitation and behavioral problems.         Objective:    Physical Exam Vitals reviewed.  Constitutional:      General: She is not in acute distress.    Appearance: She is not diaphoretic.  HENT:     Head: Normocephalic and atraumatic.     Right Ear: Tenderness present. A middle ear effusion is present. There is no impacted cerumen.     Left Ear: No tenderness. There is no impacted cerumen.     Nose: Congestion present.     Mouth/Throat:     Mouth: Mucous membranes are moist.  Eyes:     General: No scleral icterus.    Extraocular Movements: Extraocular movements intact.  Cardiovascular:     Rate and Rhythm: Normal rate and regular rhythm.     Heart sounds: Normal heart sounds. No murmur heard. Pulmonary:     Breath sounds: Normal breath sounds. No wheezing or rales.  Musculoskeletal:     Cervical back: Neck supple. No tenderness.     Right lower leg: No edema.     Left lower leg: No edema.  Skin:    General: Skin is warm.     Findings: No rash.  Neurological:     General: No focal deficit present.     Mental Status: She is alert and oriented to person, place, and time.  Psychiatric:        Mood and Affect: Mood normal.        Behavior: Behavior normal.     BP 130/76   Pulse 79   Ht 4' 11 (1.499 m)   Wt 154 lb (69.9 kg)   SpO2 99%   BMI 31.10 kg/m  Wt Readings from Last 3 Encounters:  03/03/24 154 lb (69.9 kg)  01/20/24 153 lb 8 oz (69.6 kg)  12/30/23 153 lb  3.2 oz (69.5 kg)        Assessment & Plan:   Problem List Items Addressed This Visit       Respiratory   Allergic sinusitis   Continue azelastine  nasal spray Continue Singulair  Advised to use sinus inhaler or vaporizer as needed for nasal congestion        Nervous and Auditory   Recurrent acute serous otitis media of right ear - Primary   Started ofloxacin  eardrops With persistent symptoms, may need to consider oral antibiotic Advised to keep follow-up with ENT specialist      Relevant Medications   ofloxacin  (FLOXIN ) 0.3  % OTIC solution     Meds ordered this encounter  Medications   ofloxacin  (FLOXIN ) 0.3 % OTIC solution    Sig: Place 5 drops into both ears daily.    Dispense:  5 mL    Refill:  0     Brandonn Capelli MARLA Blanch, MD

## 2024-03-03 NOTE — Assessment & Plan Note (Signed)
 Started ofloxacin  eardrops With persistent symptoms, may need to consider oral antibiotic Advised to keep follow-up with ENT specialist

## 2024-03-07 DIAGNOSIS — B9689 Other specified bacterial agents as the cause of diseases classified elsewhere: Secondary | ICD-10-CM | POA: Diagnosis not present

## 2024-03-07 DIAGNOSIS — L02229 Furuncle of trunk, unspecified: Secondary | ICD-10-CM | POA: Diagnosis not present

## 2024-03-07 DIAGNOSIS — L02821 Furuncle of head [any part, except face]: Secondary | ICD-10-CM | POA: Diagnosis not present

## 2024-03-10 ENCOUNTER — Encounter: Payer: Self-pay | Admitting: Internal Medicine

## 2024-04-04 ENCOUNTER — Ambulatory Visit (INDEPENDENT_AMBULATORY_CARE_PROVIDER_SITE_OTHER): Admitting: Otolaryngology

## 2024-04-04 ENCOUNTER — Encounter (INDEPENDENT_AMBULATORY_CARE_PROVIDER_SITE_OTHER): Payer: Self-pay | Admitting: Otolaryngology

## 2024-04-04 VITALS — BP 131/80 | HR 72 | Temp 97.4°F | Ht <= 58 in | Wt 153.0 lb

## 2024-04-04 DIAGNOSIS — H9201 Otalgia, right ear: Secondary | ICD-10-CM

## 2024-04-06 DIAGNOSIS — H9201 Otalgia, right ear: Secondary | ICD-10-CM | POA: Insufficient documentation

## 2024-04-06 NOTE — Progress Notes (Signed)
 Patient ID: Kim Grimes, female   DOB: 01-29-1951, 73 y.o.   MRN: [id number]  New complaint: Right ear pain  HPI: The patient is a 73 year old female who presents today with a new complaint of right ear pain for the past month.  The pain is intermittent.  She denies any otorrhea or change in her hearing.  She has a history of bruxism.  She was fitted with a mouthguard.  The patient was previously seen for chronic nasal congestion and nasal drainage.  She was treated with Atrovent and nasal saline irrigation.  Exam: General: Communicates without difficulty, well nourished, no acute distress. Head: Normocephalic, no evidence injury, no tenderness, facial buttresses intact without stepoff. Face/sinus: No tenderness to palpation and percussion. Facial movement is normal and symmetric. Eyes: PERRL, EOMI. No scleral icterus, conjunctivae clear. Neuro: CN II exam reveals vision grossly intact.  No nystagmus at any point of gaze. Ears: Auricles well formed without lesions.  Ear canals are intact without mass or lesion.  No erythema or edema is appreciated.  The TMs are intact without fluid.  The right TMJ area is tender to touch.  Nose: External evaluation reveals normal support and skin without lesions.  Dorsum is intact.  Anterior rhinoscopy reveals congested mucosa over anterior aspect of inferior turbinates and intact septum.  No purulence noted. Oral:  Oral cavity and oropharynx are intact, symmetric, without erythema or edema.  Mucosa is moist without lesions. Neck: Full range of motion without pain.  There is no significant lymphadenopathy.  No masses palpable.  Thyroid  bed within normal limits to palpation.  Parotid glands and submandibular glands equal bilaterally without mass.  Trachea is midline. Neuro:  CN 2-12 grossly intact.   Assessment: 1.  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 2.  Her ear canals, tympanic membranes, and middle ear spaces are normal.  Plan: 1.  The  physical exam findings are reviewed with the patient. 2.  Continue the use of her mouth guards. 3.  NSAID as needed to treat the referred otalgia. 4.  The patient is reassured and no ear infection is noted today. 5.  The patient will be seeing her dentist for possible referral to an oral surgeon.

## 2024-04-12 ENCOUNTER — Telehealth: Payer: Self-pay | Admitting: Family Medicine

## 2024-04-12 DIAGNOSIS — H26492 Other secondary cataract, left eye: Secondary | ICD-10-CM | POA: Diagnosis not present

## 2024-04-12 NOTE — Telephone Encounter (Signed)
 Please advies Copied from CRM [id number]. Topic: General - Call Back - No Documentation >> Apr 12, 2024 10:39 AM Olam RAMAN wrote: Reason for CRM: pt would like a cb to see if an order can or needs for pancreas lab work

## 2024-04-13 ENCOUNTER — Telehealth: Payer: Self-pay

## 2024-04-13 NOTE — Telephone Encounter (Signed)
 Returned the pt's call and was advised she needed an appt to see Dr Cindie. I advised the pt that she needs to call the front desk and if he has no appts she would be put on the cancellation list to be called if we have any. Pt transferred to the front desk

## 2024-04-13 NOTE — Telephone Encounter (Signed)
Pt scheduled for November

## 2024-04-29 ENCOUNTER — Other Ambulatory Visit: Payer: Self-pay | Admitting: Orthopedic Surgery

## 2024-04-29 ENCOUNTER — Other Ambulatory Visit (INDEPENDENT_AMBULATORY_CARE_PROVIDER_SITE_OTHER): Payer: Self-pay

## 2024-04-29 ENCOUNTER — Ambulatory Visit: Admitting: Orthopedic Surgery

## 2024-04-29 DIAGNOSIS — M1712 Unilateral primary osteoarthritis, left knee: Secondary | ICD-10-CM | POA: Diagnosis not present

## 2024-04-29 DIAGNOSIS — G8929 Other chronic pain: Secondary | ICD-10-CM

## 2024-04-29 NOTE — Progress Notes (Signed)
    04/29/2024   Chief Complaint  Patient presents with   Knee Pain    Left knee pain. Is better but is steps the wrong way hurts. Hurts worse at night. Wants to talk about injection    No diagnosis found.  What pharmacy do you use ? [facility]______  DOI/DOS/ Date:   Did you get better, worse or no change (Answer below)   Unchanged

## 2024-04-29 NOTE — Progress Notes (Signed)
   Encounter Diagnoses  Name Primary?   Chronic pain of left knee Yes   Primary osteoarthritis of left knee    Chief Complaint  Patient presents with   Knee Pain    Left knee pain. Is better but is steps the wrong way hurts. Hurts worse at night. Wants to talk about injection    Kim Grimes would like to discuss injection options  She cannot take aspirin and does not like to take medication  She is inquiring about which injections she is eligible to get We discussed cortisone injection Hyaluronic acid injection PRP Stem cell  She has not had an x-ray in a year Her last x-ray did not show a significant amount of disease although her MRI did show major cartilage loss especially in the medial compartment and somewhat in the patellofemoral compartment    MRI report IMPRESSION: 1. Progressive free edge fraying of the posterior horn of the medial meniscus without well-defined radial tear or progressive meniscal extrusion. The lateral meniscus, cruciate and collateral ligaments are intact. 2. Mildly progressive tricompartmental degenerative changes, most advanced in the medial compartment. No acute osseous findings. 3. Enlarging moderate-sized joint effusion and Baker's cyst with evidence of synovitis.     Electronically Signed   By: Elsie Perone M.D.   On: 02/09/2023 14:35  CARTILAGE   Patellofemoral: Mildly progressive patellofemoral degenerative changes with patellar chondral thinning and surface irregularity. There is mild subchondral cyst formation in the medial facet.   Medial: Moderate to severe medial compartment osteoarthritis with diffuse chondral thinning and surface irregularity. Previously demonstrated marrow edema in the medial tibial plateau has largely resolved.   Lateral: Stable mild lateral compartment degenerative chondrosis without full-thickness chondral defect or subchondral signal abnormality.   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

## 2024-05-02 ENCOUNTER — Telehealth: Payer: Self-pay | Admitting: Radiology

## 2024-05-02 ENCOUNTER — Encounter: Payer: Self-pay | Admitting: Radiology

## 2024-05-02 DIAGNOSIS — M1712 Unilateral primary osteoarthritis, left knee: Secondary | ICD-10-CM

## 2024-05-02 DIAGNOSIS — G8929 Other chronic pain: Secondary | ICD-10-CM

## 2024-05-02 NOTE — Telephone Encounter (Signed)
-----   Message from [facility] May sent at 04/29/2024  1:39 PM EDT ----- Regarding: Gel injections order April J, this one needs gel, please enter order and start process?    Dr VEAR, will you please let Harvie Morua know when these are needed?  There is an ambulatory referral she will enter and send to April J.    Thanks! ----- Message ----- From: Margrette Taft BRAVO, MD Sent: 04/29/2024  10:33 AM EDT To: Sari May, RT; Hope H Surrett  Order HA

## 2024-05-02 NOTE — Telephone Encounter (Signed)
 I put them in He must have forgot to tell Samule I was in xray on Friday

## 2024-05-05 ENCOUNTER — Telehealth: Payer: Self-pay

## 2024-05-05 NOTE — Telephone Encounter (Signed)
 Patient called this morning to see if we had gotten approval from her insurance company for her gel injection. I explained to her yesterday when she stopped in the office to see how the process works and she stated she understands. I explained to her again this morning about the process and she stated she understands.

## 2024-05-06 ENCOUNTER — Ambulatory Visit: Admitting: Family Medicine

## 2024-05-06 ENCOUNTER — Encounter: Payer: Self-pay | Admitting: Family Medicine

## 2024-05-06 VITALS — BP 118/70 | HR 72 | Ht <= 58 in | Wt 155.0 lb

## 2024-05-06 DIAGNOSIS — D126 Benign neoplasm of colon, unspecified: Secondary | ICD-10-CM

## 2024-05-06 DIAGNOSIS — E785 Hyperlipidemia, unspecified: Secondary | ICD-10-CM

## 2024-05-06 DIAGNOSIS — Z800 Family history of malignant neoplasm of digestive organs: Secondary | ICD-10-CM | POA: Diagnosis not present

## 2024-05-06 DIAGNOSIS — R195 Other fecal abnormalities: Secondary | ICD-10-CM

## 2024-05-06 DIAGNOSIS — E038 Other specified hypothyroidism: Secondary | ICD-10-CM | POA: Diagnosis not present

## 2024-05-06 NOTE — Patient Instructions (Signed)
 F/U in 6 monhts  Fasting lab order will be at front desk for you to get labs next week  You are referred to Dr Cindie  It is important that you exercise regularly at least 30 minutes 5 times a week. If you develop chest pain, have severe difficulty breathing, or feel very tired, stop exercising immediately and seek medical attention   Think about what you will eat, plan ahead. Choose  clean, green, fresh or frozen over canned, processed or packaged foods which are more sugary, salty and fatty. 70 to 75% of food eaten should be vegetables and fruit. Three meals at set times with snacks allowed between meals, but they must be fruit or vegetables. Aim to eat over a 12 hour period , example 7 am to 7 pm, and STOP after  your last meal of the day. Drink water ,generally about 64 ounces per day, no other drink is as healthy. Fruit juice is best enjoyed in a healthy way, by EATING the fruit. Thanks for choosing [facility], we consider it a privelige to serve you.

## 2024-05-06 NOTE — Assessment & Plan Note (Addendum)
 Mother died in her 29's with pancreatic cancer , also brother in his 54's after 5 years, GI to evaluate pt as has concerns as to whethewr she also has the disease, wants any tests available done

## 2024-05-10 ENCOUNTER — Encounter: Payer: Self-pay | Admitting: Family Medicine

## 2024-05-10 DIAGNOSIS — D126 Benign neoplasm of colon, unspecified: Secondary | ICD-10-CM | POA: Insufficient documentation

## 2024-05-10 DIAGNOSIS — E785 Hyperlipidemia, unspecified: Secondary | ICD-10-CM | POA: Insufficient documentation

## 2024-05-10 NOTE — Assessment & Plan Note (Signed)
 Hyperlipidemia:Low fat diet discussed and encouraged.   Lipid Panel  Lab Results  Component Value Date   CHOL 189 03/13/2023   HDL 62 03/13/2023   LDLCALC 116 (H) 03/13/2023   TRIG 57 03/13/2023   CHOLHDL 3.0 03/13/2023     Updated lab needed at/ before next visit.

## 2024-05-10 NOTE — Assessment & Plan Note (Signed)
Managed by Endo and controlled 

## 2024-05-10 NOTE — Progress Notes (Signed)
   Kim Grimes     MRN: [id number]      DOB: August 17, 1950  Chief Complaint  Patient presents with   Labs Only    Lab to check pancreas    Headache    Headache due to blood pressure being elevated    HPI [name] is here and  her main concern that she wants to be evaluated for pancreatic cancer as her Mother and a brother both had this disease. Wants ' blood test Also reports change in stool caliber over past  approximately 3 months. C/o frontal pressure since coming to office , getting an elevated blood pressure reading when her pressure is never high , and feeling aggravated at having to wait. Denies nasal drainage or sinus symptoms, denies fever or chills  ROS Denies recent fever or chills. Denies sinus pressure, nasal congestion, ear pain or sore throat. Denies chest congestion, productive cough or wheezing. Denies chest pains, palpitations and leg swelling Denies abdominal pain, nausea, vomiting.   Denies dysuria, frequency, hesitancy or incontinence. Denies joint pain, swelling and limitation in mobility. Denies headaches, seizures, numbness, or tingling. Denies depressionDenies skin break down or rash.   PE  BP 118/70   Pulse 72   Ht 4' 10 (1.473 m)   Wt 155 lb (70.3 kg)   SpO2 96%   BMI 32.40 kg/m   Patient alert and oriented and in no cardiopulmonary distress.  HEENT: No facial asymmetry, EOMI,     Neck supple .  Chest: Clear to auscultation bilaterally.  CVS: S1, S2 no murmurs, no S3.Regular rate.  ABD: Soft superficial tender.ness   Ext: No edema  MS: Adequate ROM spine, shoulders, hips and knees.  Skin: Intact, no ulcerations or rash noted.  Psych: Good eye contact, normal affect. Memory intact not anxious or depressed appearing.  CNS: CN 2-12 intact, power,  normal throughout.no focal deficits noted.   Assessment & Plan Family history of pancreatic cancer Mother died in her 35's with pancreatic cancer , also brother in his 43's after  5 years, GI to evaluate pt as has concerns as to whethewr she also has the disease, wants any tests available done  Tubular adenoma of colon Refer GI due to report of recent change in stool caliber  Change in stool caliber 3 monht h/o change  with stringy stool, refer GI for eval  Hypothyroidism Managed by Endo and controlled  Dyslipidemia Hyperlipidemia:Low fat diet discussed and encouraged.   Lipid Panel  Lab Results  Component Value Date   CHOL 189 03/13/2023   HDL 62 03/13/2023   LDLCALC 116 (H) 03/13/2023   TRIG 57 03/13/2023   CHOLHDL 3.0 03/13/2023     Updated lab needed at/ before next visit.

## 2024-05-10 NOTE — Assessment & Plan Note (Signed)
 Refer GI due to report of recent change in stool caliber

## 2024-05-10 NOTE — Assessment & Plan Note (Signed)
 3 monht h/o change  with stringy stool, refer GI for eval

## 2024-05-11 DIAGNOSIS — D126 Benign neoplasm of colon, unspecified: Secondary | ICD-10-CM | POA: Diagnosis not present

## 2024-05-11 DIAGNOSIS — Z800 Family history of malignant neoplasm of digestive organs: Secondary | ICD-10-CM | POA: Diagnosis not present

## 2024-05-11 DIAGNOSIS — R195 Other fecal abnormalities: Secondary | ICD-10-CM | POA: Diagnosis not present

## 2024-05-12 ENCOUNTER — Ambulatory Visit: Payer: Self-pay | Admitting: Family Medicine

## 2024-05-12 LAB — CMP14+EGFR
ALT: 14 IU/L (ref 0–32)
AST: 19 IU/L (ref 0–40)
Albumin: 3.9 g/dL (ref 3.8–4.8)
Alkaline Phosphatase: 83 IU/L (ref 49–135)
BUN/Creatinine Ratio: 14 (ref 12–28)
BUN: 12 mg/dL (ref 8–27)
Bilirubin Total: 0.5 mg/dL (ref 0.0–1.2)
CO2: 24 mmol/L (ref 20–29)
Calcium: 9.3 mg/dL (ref 8.7–10.3)
Chloride: 105 mmol/L (ref 96–106)
Creatinine, Ser: 0.84 mg/dL (ref 0.57–1.00)
Globulin, Total: 2.4 g/dL (ref 1.5–4.5)
Glucose: 81 mg/dL (ref 70–99)
Potassium: 3.8 mmol/L (ref 3.5–5.2)
Sodium: 141 mmol/L (ref 134–144)
Total Protein: 6.3 g/dL (ref 6.0–8.5)
eGFR: 74 mL/min/1.73 (ref >59)

## 2024-05-12 LAB — LIPID PANEL W/O CHOL/HDL RATIO
Cholesterol, Total: 198 mg/dL (ref 100–199)
HDL: 57 mg/dL (ref >39)
LDL Chol Calc (NIH): 125 mg/dL — ABNORMAL HIGH (ref 0–99)
Triglycerides: 86 mg/dL (ref 0–149)
VLDL Cholesterol Cal: 16 mg/dL (ref 5–40)

## 2024-05-12 LAB — CBC
Hematocrit: 41.2 % (ref 34.0–46.6)
Hemoglobin: 13.8 g/dL (ref 11.1–15.9)
MCH: 30 pg (ref 26.6–33.0)
MCHC: 33.5 g/dL (ref 31.5–35.7)
MCV: 90 fL (ref 79–97)
Platelets: 279 x10E3/uL (ref 150–450)
RBC: 4.6 x10E6/uL (ref 3.77–5.28)
RDW: 13.1 % (ref 11.7–15.4)
WBC: 4.8 x10E3/uL (ref 3.4–10.8)

## 2024-05-16 DIAGNOSIS — H401131 Primary open-angle glaucoma, bilateral, mild stage: Secondary | ICD-10-CM | POA: Diagnosis not present

## 2024-06-02 ENCOUNTER — Ambulatory Visit: Admitting: Orthopedic Surgery

## 2024-06-02 DIAGNOSIS — M1712 Unilateral primary osteoarthritis, left knee: Secondary | ICD-10-CM

## 2024-06-02 MED ORDER — SODIUM HYALURONATE (VISCOSUP) 20 MG/2ML IX SOSY
20.0000 mg | PREFILLED_SYRINGE | INTRA_ARTICULAR | Status: AC
  Administered 2024-06-02 – 2024-06-16 (×3): 20 mg via INTRA_ARTICULAR

## 2024-06-02 NOTE — Progress Notes (Signed)
 Patient: Kim Grimes           Date of Birth: [birth year]           MRN: [id number] Visit Date: 06/02/2024 Requested by: Antonetta Rollene BRAVO, MD [address] [place],  [place] [address] PCP: Antonetta Rollene BRAVO, MD  Chief Complaint  Patient presents with   Injections    Left knee    Encounter Diagnosis  Name Primary?   Arthritis of left knee Yes    The patient has consented to and requested hyaluronic acid injection   MEDICATION: Euflexxa 1 of 3  left  THE knee is prepped with alcohol and ethyl chloride  The injection is performed with a 21-gauge needle, via inferolateral approach  No complications were noted  Appropriate instructions post injection were given   Injection next week

## 2024-06-09 ENCOUNTER — Ambulatory Visit: Admitting: Orthopedic Surgery

## 2024-06-09 ENCOUNTER — Encounter: Payer: Self-pay | Admitting: Orthopedic Surgery

## 2024-06-09 DIAGNOSIS — M1712 Unilateral primary osteoarthritis, left knee: Secondary | ICD-10-CM | POA: Diagnosis not present

## 2024-06-09 NOTE — Progress Notes (Signed)
 Patient: Kim Grimes           Date of Birth: 1951-02-24           MRN: [id number] Visit Date: 06/09/2024 Requested by: Antonetta Rollene BRAVO, MD [address] [place],  [place] [address] PCP: Antonetta Rollene BRAVO, MD  Chief Complaint  Patient presents with   Injections    Left knee Euflexxa #2    Encounter Diagnosis  Name Primary?   Arthritis of left knee Yes    The patient has consented to and requested hyaluronic acid injection     left  THE knee is prepped with alcohol and ethyl chloride  The injection is performed with a 21-gauge needle, via inferolateral approach  No complications were noted  Appropriate instructions post injection were given   Repeat in 1 week #3

## 2024-06-10 ENCOUNTER — Encounter: Payer: Self-pay | Admitting: Internal Medicine

## 2024-06-16 ENCOUNTER — Encounter: Payer: Self-pay | Admitting: Orthopedic Surgery

## 2024-06-16 ENCOUNTER — Ambulatory Visit: Admitting: Orthopedic Surgery

## 2024-06-16 DIAGNOSIS — M1712 Unilateral primary osteoarthritis, left knee: Secondary | ICD-10-CM

## 2024-06-16 NOTE — Patient Instructions (Signed)
 Gel injections may be repeated in 6 months

## 2024-06-16 NOTE — Progress Notes (Signed)
 Patient: Kim Grimes           Date of Birth: 1951/04/11           MRN: [id number] Visit Date: 06/16/2024 Requested by: Antonetta Rollene BRAVO, MD [address] [place],  [place] [address] PCP: Antonetta Rollene BRAVO, MD  Chief Complaint  Patient presents with   Injections    Left knee Euflexxa 3    No diagnosis found.  The patient has consented to and requested hyaluronic acid injection    left  THE knee is prepped with alcohol and ethyl chloride  The injection is performed with a 21-gauge needle, via inferolateral approach  No complications were noted  Appropriate instructions post injection were given   Repeat 6 months

## 2024-07-01 ENCOUNTER — Other Ambulatory Visit

## 2024-07-14 ENCOUNTER — Encounter: Payer: Self-pay | Admitting: Internal Medicine

## 2024-07-14 ENCOUNTER — Ambulatory Visit (INDEPENDENT_AMBULATORY_CARE_PROVIDER_SITE_OTHER): Payer: Medicare HMO | Admitting: Internal Medicine

## 2024-07-14 ENCOUNTER — Other Ambulatory Visit

## 2024-07-14 VITALS — BP 120/60 | HR 75 | Ht <= 58 in | Wt 152.2 lb

## 2024-07-14 DIAGNOSIS — E039 Hypothyroidism, unspecified: Secondary | ICD-10-CM

## 2024-07-14 NOTE — Progress Notes (Signed)
 Patient ID: Kim Grimes, female   DOB: 1950/08/01, 74 y.o.   MRN: [id number]  HPI  Kim Grimes is a 74 y.o.-year-old female, returning for follow-up for hypothyroidism.  She previously saw Dr. Kassie, but last visit with me 1 year ago. She saw DR. Nida before.  Interim history: She feels well today, without complaints.  She was able to lose 4 pounds since last visit, despite the holidays.  Reviewed hx: Pt. has been dx with hypothyroidism in 2011 by Dr. Rutherford 2/2 fatigue >> on generic levothyroxine . She remembers having brittle hair after starting LT4 100 mcg daily in 2011.  In 07/2022, she was on LT4 88 mcg daily, and we decreased the dose to 75 mcg daily. In 08/2022, she was on LT4 75 mcg daily, and we decreased the dose to 50 mcg daily. In 10/2022, she was on LT4 50 mcg daily, and we increased the dose to 50 alternating with 75 mcg every other day  She takes the levothyroxine : - not forgetting it anymore >> if she does, she takes it later! - moved LT4 from the kitchen counter to her dresser - in am (mostly 2 am) - stopped coffee + creamer >> now drinking hot water  - fasting - at least 3h from b'fast - no calcium - no iron - no multivitamins - + PPIs (Esomeprazole ) at 5:30 am  - not on Biotin  I reviewed pt's thyroid  tests: Lab Results  Component Value Date   TSH 1.52 06/26/2023   TSH 1.52 02/26/2023   TSH 1.67 12/23/2022   TSH 2.39 10/29/2022   TSH 0.03 (L) 09/15/2022   TSH 0.03 (L) 08/13/2022   TSH 1.20 11/04/2021   TSH 3.70 05/03/2021   TSH 1.95 07/12/2020   TSH 4.01 04/10/2020   FREET4 1.4 06/26/2023   FREET4 1.28 02/26/2023   FREET4 1.16 12/23/2022   FREET4 1.15 10/29/2022   FREET4 1.65 (H) 09/15/2022   FREET4 1.78 (H) 08/13/2022   FREET4 1.25 11/04/2021   FREET4 1.2 05/03/2021   FREET4 1.30 07/12/2020   FREET4 0.91 04/10/2020   Antithyroid antibodies: Component     Latest Ref Rng 08/13/2022  Thyroperoxidase Ab SerPl-aCnc     <9 IU/mL 1    Thyroglobulin Ab     < or = 1 IU/mL <1    She denies: - weight gain - fatigue - cold intolerance - constipation - hair loss  Pt denies: - feeling nodules in neck - dysphagia - choking Occasional hoarseness - sinus drainage.  Coughing in her sleep.  She has no FH of thyroid  disorders. No FH of thyroid  cancer.  No h/o radiation tx to head or neck. No recent use of iodine  supplements. No recent steroid use. No use of herbal supplements.  Pt. also has a history of CAD and carotid artery stenosis, hyperlipidemia, GERD, hiatal hernia, IBS, vitamin D  deficiency, osteoporosis.  ROS: + see HPI  Past Medical History:  Diagnosis Date   Anxiety disorder    Carotid artery stenosis    Chronic pelvic pain in female    Coronary artery disease    Endometriosis    GERD (gastroesophageal reflux disease)    Helicobacter pylori gastritis    Dr. Obie    Hiatal hernia 2003   on UGI    Hypothyroidism 2009   IBS (irritable bowel syndrome) 2003   Obesity (BMI 30-39.9) DEC 2011 145 LBS   Osteoporosis    PONV (postoperative nausea and vomiting)    Past Surgical History:  Procedure Laterality Date   ABDOMINAL HYSTERECTOMY     adhesiolysis     dense adhesion between rectum and sigmoid,  & pelvis    BIOPSY  05/20/2022   Procedure: BIOPSY;  Surgeon: Cindie Carlin POUR, DO;  [facility];  Service: Endoscopy;;   BRAVO PH STUDY  01/19/2012   Procedure: BRAVO PH STUDY;  Surgeon: Margo LITTIE Haddock, MD;  [facility];  Service: Endoscopy;;   CATARACT EXTRACTION W/PHACO Left 06/03/2019   Procedure: CATARACT EXTRACTION PHACO AND INTRAOCULAR LENS PLACEMENT LEFT EYE (CDE: 2.92);  Surgeon: Harrie Agent, MD;  Location: [facility];  Service: Ophthalmology;  Laterality: Left;   COLONOSCOPY  03: AP/D & 08: SCREENING   WNL'S   COLONOSCOPY  07/28/2011   sessile polyp in ascending colon/internal hemorrhoids. Next colonoscopy January 2018   COLONOSCOPY N/A 08/01/2016   Procedure:  COLONOSCOPY;  Surgeon: Margo LITTIE Haddock, MD;  [facility];  Service: Endoscopy;  Laterality: N/A;  930    COLONOSCOPY WITH PROPOFOL  N/A 12/24/2021   Procedure: COLONOSCOPY WITH PROPOFOL ;  Surgeon: Cindie Carlin POUR, DO;  [facility];  Service: Endoscopy;  Laterality: N/A;  9:00am   ESOPHAGOGASTRODUODENOSCOPY  01/19/2012   SLF: SMALL Hiatal hernia/Mild gastritis   ESOPHAGOGASTRODUODENOSCOPY (EGD) WITH PROPOFOL  N/A 05/20/2022   Procedure: ESOPHAGOGASTRODUODENOSCOPY (EGD) WITH PROPOFOL ;  Surgeon: Cindie Carlin POUR, DO;  [facility];  Service: Endoscopy;  Laterality: N/A;  12:45 pm, pt knows to arrive at 7:00   EYE SURGERY Right 10/18/2013   catarat extraction   LAPAROSCOPY     OLECRANON BURSECTOMY Left 07/01/2018   Procedure: LEFT ELBOW OLECRANON BURSECTOMY AND SPUR EXCISION;  Surgeon: Margrette Taft BRAVO, MD;  Location: [facility];  Service: Orthopedics;  Laterality: Left;   POLYPECTOMY  12/24/2021   Procedure: POLYPECTOMY INTESTINAL;  Surgeon: Cindie Carlin POUR, DO;  [facility];  Service: Endoscopy;;   rt foot sx Bilateral    s/p hysterectomy     SALPINGOOPHORECTOMY     UPPER GASTROINTESTINAL ENDOSCOPY  DEC 2010: DYSPEPSIA/DYSPHAGIA   SCH RING/ESO DIL 16 MM, GASTRITIS/DUODENITIS 2o to Aleve /on OMP PRN   Social History   Socioeconomic History   Marital status: Divorced    Spouse name: Not on file   Number of children: Not on file   Years of education: Not on file   Highest education level: Not on file  Occupational History   Occupation: fulltime at [employer]   Tobacco Use   Smoking status: Never   Smokeless tobacco: Never   Tobacco comments:    Never smoked  Vaping Use   Vaping status: Never Used  Substance and Sexual Activity   Alcohol use: Not Currently    Comment: occasional, maybe once/year   Drug use: No   Sexual activity: Not Currently    Birth control/protection: None  Other Topics Concern   Not on file  Social  History Narrative   Not on file   Social Drivers of Health   Tobacco Use: Low Risk (06/16/2024)   Patient History    Smoking Tobacco Use: Never    Smokeless Tobacco Use: Never    Passive Exposure: Not on file  Financial Resource Strain: Low Risk (05/27/2023)   Overall Financial Resource Strain (CARDIA)    Difficulty of Paying Living Expenses: Not hard at all  Food Insecurity: No Food Insecurity (05/27/2023)   Hunger Vital Sign    Worried About Running Out of Food in the Last Year: Never  true    Ran Out of Food in the Last Year: Never true  Transportation Needs: No Transportation Needs (05/27/2023)   PRAPARE - Administrator, Civil Service (Medical): No    Lack of Transportation (Non-Medical): No  Physical Activity: Sufficiently Active (05/27/2023)   Exercise Vital Sign    Days of Exercise per Week: 5 days    Minutes of Exercise per Session: 30 min  Stress: No Stress Concern Present (05/27/2023)   [employer] of Occupational Health - Occupational Stress Questionnaire    Feeling of Stress : Not at all  Social Connections: Moderately Integrated (05/27/2023)   Social Connection and Isolation Panel    Frequency of Communication with Friends and Family: More than three times a week    Frequency of Social Gatherings with Friends and Family: Three times a week    Attends Religious Services: More than 4 times per year    Active Member of Clubs or Organizations: Yes    Attends Banker Meetings: More than 4 times per year    Marital Status: Divorced  Intimate Partner Violence: Not At Risk (05/27/2023)   Humiliation, Afraid, Rape, and Kick questionnaire    Fear of Current or Ex-Partner: No    Emotionally Abused: No    Physically Abused: No    Sexually Abused: No  Depression (PHQ2-9): Low Risk (03/03/2024)   Depression (PHQ2-9)    PHQ-2 Score: 0  Alcohol Screen: Low Risk (05/27/2023)   Alcohol Screen    Last Alcohol Screening Score (AUDIT): 0  Housing:  Low Risk (05/27/2023)   Housing    Last Housing Risk Score: 0  Utilities: Not At Risk (05/27/2023)   AHC Utilities    Threatened with loss of utilities: No  Health Literacy: Adequate Health Literacy (05/27/2023)   B1300 Health Literacy    Frequency of need for help with medical instructions: Never   Current Outpatient Medications on File Prior to Visit  Medication Sig Dispense Refill   azelastine  (ASTELIN ) 0.1 % nasal spray Place 1 spray into both nostrils 2 (two) times daily as needed for rhinitis. Use in each nostril as directed     esomeprazole  (NEXIUM ) 40 MG capsule TAKE 1 CAPSULE (40 MG TOTAL) BY MOUTH 2 (TWO) TIMES DAILY BEFORE A MEAL. 180 capsule 3   levothyroxine  (SYNTHROID ) 50 MCG tablet Take 1 tablet (50 mcg total) by mouth every other day. 45 tablet 3   levothyroxine  (SYNTHROID ) 75 MCG tablet Take 1 tablet (75 mcg total) by mouth every other day. 45 tablet 3   No current facility-administered medications on file prior to visit.   Allergies  Allergen Reactions   Aspirin Other (See Comments)    blood rushing   Diazepam  Other (See Comments)    Agitation   Prednisone Other (See Comments)    Any type of steroids. Pt states she has seen what they do to other people and she just doesn't take them   Family History  Problem Relation Age of Onset   Prostate cancer Father    Colon cancer Father        < 53 YO   Cancer Father        prostate and colon   Pancreatic cancer Mother    Cancer Mother        pancreas   Diabetes Mother    Diabetes Brother    Cancer Brother        prostate    Diabetes Sister  prediabetic   Heart disease Brother        CHF   Thyroid  disease Neg Hx    PE: BP 120/60   Pulse 75   Ht 4' 10 (1.473 m)   Wt 152 lb 3.2 oz (69 kg)   SpO2 95%   BMI 31.81 kg/m  Wt Readings from Last 10 Encounters:  07/14/24 152 lb 3.2 oz (69 kg)  05/06/24 155 lb (70.3 kg)  04/04/24 153 lb (69.4 kg)  03/03/24 154 lb (69.9 kg)  01/20/24 153 lb 8 oz (69.6  kg)  12/30/23 153 lb 3.2 oz (69.5 kg)  10/05/23 152 lb 9.6 oz (69.2 kg)  09/23/23 152 lb (68.9 kg)  08/18/23 154 lb (69.9 kg)  07/14/23 156 lb 3.2 oz (70.9 kg)   Constitutional:  normal weight, in NAD Eyes:  EOMI, no exophthalmos ENT: no neck masses, no cervical lymphadenopathy Cardiovascular: RRR, No MRG, + LLE mild periankle edema Respiratory: CTA B Musculoskeletal: no deformities Skin:no rashes Neurological: no tremor with outstretched hands  ASSESSMENT: 1. Hypothyroidism  PLAN:  1. Patient with longstanding hypothyroidism, on levothyroxine  therapy - latest thyroid  labs reviewed with pt. >> normal: Lab Results  Component Value Date   TSH 1.52 06/26/2023  - she continues on LT4 62.5 mcg daily (alternating 75 and 50 mcg every other day).  She would be interested in taking a single dose but I explained that the tablet does not come in this dosage. - pt feels good on this dose, without complaints today - we discussed about taking the thyroid  hormone every day, with water , >30 minutes before breakfast, separated by >4 hours from acid reflux medications, calcium, iron, multivitamins. Pt. is taking it correctly.  She was previously missing many doses but not taking it consistently or remembering to double the dose the next day if she forgets 1 day.   Of note, she is on esomeprazole , which she takes at 5:30 in the morning.  However, she normally takes LT4 around 2 AM, so there is enough separation from the PPI. - will check thyroid  tests today: TSH and fT4 - If labs are abnormal, she will need to return for repeat TFTs in 1.5 months - I we will see her back in a year  Orders Placed This Encounter  Procedures   TSH   T4, free   Lela Fendt, MD PhD [facility] Endocrinology

## 2024-07-14 NOTE — Patient Instructions (Signed)
 Please stop at the lab.  Please continue levothyroxine  50 alternating with 75 mcg every other day.  Take the thyroid  hormone every day, with water , at least 30 minutes before breakfast, separated by at least 4 hours from: - acid reflux medications - calcium - iron - multivitamins  You should have an endocrinology follow-up appointment in 1 year.

## 2024-07-15 ENCOUNTER — Ambulatory Visit: Payer: Self-pay | Admitting: Internal Medicine

## 2024-07-15 LAB — TSH: TSH: 0.85 m[IU]/L (ref 0.40–4.50)

## 2024-07-15 LAB — T4, FREE: Free T4: 1.6 ng/dL (ref 0.8–1.8)

## 2024-07-15 MED ORDER — LEVOTHYROXINE SODIUM 75 MCG PO TABS: 75.0000 ug | ORAL_TABLET | ORAL | 3 refills | Status: AC

## 2024-07-15 MED ORDER — LEVOTHYROXINE SODIUM 50 MCG PO TABS: 50.0000 ug | ORAL_TABLET | ORAL | 3 refills | Status: AC

## 2024-07-15 NOTE — Addendum Note (Signed)
 Addended by: TRIXIE FILE on: 07/15/2024 09:00 AM   Modules accepted: Orders

## 2024-07-18 ENCOUNTER — Ambulatory Visit (INDEPENDENT_AMBULATORY_CARE_PROVIDER_SITE_OTHER): Admitting: Internal Medicine

## 2024-07-18 ENCOUNTER — Encounter: Payer: Self-pay | Admitting: Internal Medicine

## 2024-07-18 VITALS — BP 133/82 | HR 76 | Temp 97.3°F | Wt 152.9 lb

## 2024-07-18 DIAGNOSIS — K219 Gastro-esophageal reflux disease without esophagitis: Secondary | ICD-10-CM

## 2024-07-18 DIAGNOSIS — K5900 Constipation, unspecified: Secondary | ICD-10-CM

## 2024-07-18 DIAGNOSIS — R1032 Left lower quadrant pain: Secondary | ICD-10-CM

## 2024-07-18 DIAGNOSIS — R197 Diarrhea, unspecified: Secondary | ICD-10-CM

## 2024-07-18 DIAGNOSIS — K5904 Chronic idiopathic constipation: Secondary | ICD-10-CM

## 2024-07-18 DIAGNOSIS — Z800 Family history of malignant neoplasm of digestive organs: Secondary | ICD-10-CM

## 2024-07-18 NOTE — Patient Instructions (Signed)
 I am going to order a CT scan of your abdomen pelvis to further evaluate your abdominal pain and diarrhea.  This will also look at your pancreas.  I am going to order blood work at [facility] to test for genetic abnormalities given your family history of pancreatic cancer.  Follow-up in 3 months.  It was very nice seeing you again today.  Dr. Cindie

## 2024-07-18 NOTE — Progress Notes (Signed)
 "   Referring Provider: Antonetta Rollene BRAVO, MD Primary Care Physician:  Antonetta Rollene BRAVO, MD Primary GI:  Dr. Cindie  Chief Complaint  Patient presents with   Follow-up    Patient here today says she wants to be checked for issues with her pancreas,as pancreatic cancer runs in her family. Both her mother and brother had pancreatic cancer.  She is having some LLQ pain, which started last weeks. Patient says she has a history of diverticulitis.  She has also had issues for a while with diarrhea, depending on what she eats.     HPI:   Kim Grimes is a 74 y.o. female who presents to clinic today for follow-up visit.  She has a history of chronic GERD, hiatal hernia, H. pylori gastritis, constipation, chronic abdominal pain.  Previous workup below:  EGD 01/19/2012: -Small hernia -Gastritis -Fundic gland polyp and biopsies negative for H. pylori   Colonoscopy 07/2016: -2 polyps at the hepatic flexure and mid ascending colon -Internal hemorrhoids -Redundant left colon -Repeat colonoscopy in 5 years   EGD 04/2022: -Small hiatal hernia -Scattered mild inflammation characterized by erythema in the gastric body -Multiple 3-8 mm sessile polyps with no bleeding -Pathology revealed benign fundic gland polyps, gastric biopsy negative for H. Pylori   Colonoscopy 12/24/2021 for surveillance: -1 tubular adenoma removed -Due for repeat in 2028  Abdominal pain, diarrhea: Patient states she was in her normal state of health until Friday when she had sudden onset of left lower quadrant abdominal pain.  She has also had multiple episodes of diarrhea which is different than her normal.  No prior history of diverticulitis.  Denies any melena hematochezia.  GERD: Previously trialed and failed lansoprazole , omeprazole , currently on esomeprazole  40 mg.  Supposed to be taking twice daily though this only been taking once daily.  Dexilant  worked the best though cost has been an issue.  Was given  samples of Voquezna but did not take this after reading side effect profile.  Constipation: Previously well-controlled on align.  Insurance did not cover Amitiza .  Family history of pancreatic cancer: Reports family history of pancreatic cancer and her brother and mother.  No formal genetic testing in the past.  Past Medical History:  Diagnosis Date   Anxiety disorder    Carotid artery stenosis    Chronic pelvic pain in female    Coronary artery disease    Endometriosis    GERD (gastroesophageal reflux disease)    Helicobacter pylori gastritis    Dr. Obie    Hiatal hernia 2003   on UGI    Hypothyroidism 2009   IBS (irritable bowel syndrome) 2003   Obesity (BMI 30-39.9) DEC 2011 145 LBS   Osteoporosis    PONV (postoperative nausea and vomiting)     Past Surgical History:  Procedure Laterality Date   ABDOMINAL HYSTERECTOMY     adhesiolysis     dense adhesion between rectum and sigmoid,  & pelvis    BIOPSY  05/20/2022   Procedure: BIOPSY;  Surgeon: Cindie Carlin POUR, DO;  [facility];  Service: Endoscopy;;   BRAVO PH STUDY  01/19/2012   Procedure: BRAVO PH STUDY;  Surgeon: Margo LITTIE Haddock, MD;  [facility];  Service: Endoscopy;;   CATARACT EXTRACTION W/PHACO Left 06/03/2019   Procedure: CATARACT EXTRACTION PHACO AND INTRAOCULAR LENS PLACEMENT LEFT EYE (CDE: 2.92);  Surgeon: Harrie Agent, MD;  Location: [facility];  Service: Ophthalmology;  Laterality: Left;   COLONOSCOPY  03: AP/D &  08: SCREENING   WNL'S   COLONOSCOPY  07/28/2011   sessile polyp in ascending colon/internal hemorrhoids. Next colonoscopy January 2018   COLONOSCOPY N/A 08/01/2016   Procedure: COLONOSCOPY;  Surgeon: Margo LITTIE Haddock, MD;  [facility];  Service: Endoscopy;  Laterality: N/A;  930    COLONOSCOPY WITH PROPOFOL  N/A 12/24/2021   Procedure: COLONOSCOPY WITH PROPOFOL ;  Surgeon: Cindie Carlin POUR, DO;  [facility];  Service: Endoscopy;  Laterality: N/A;  9:00am    ESOPHAGOGASTRODUODENOSCOPY  01/19/2012   SLF: SMALL Hiatal hernia/Mild gastritis   ESOPHAGOGASTRODUODENOSCOPY (EGD) WITH PROPOFOL  N/A 05/20/2022   Procedure: ESOPHAGOGASTRODUODENOSCOPY (EGD) WITH PROPOFOL ;  Surgeon: Cindie Carlin POUR, DO;  [facility];  Service: Endoscopy;  Laterality: N/A;  12:45 pm, pt knows to arrive at 7:00   EYE SURGERY Right 10/18/2013   catarat extraction   LAPAROSCOPY     OLECRANON BURSECTOMY Left 07/01/2018   Procedure: LEFT ELBOW OLECRANON BURSECTOMY AND SPUR EXCISION;  Surgeon: Margrette Taft BRAVO, MD;  [facility]: [facility];  Service: Orthopedics;  Laterality: Left;   POLYPECTOMY  12/24/2021   Procedure: POLYPECTOMY INTESTINAL;  Surgeon: Cindie Carlin POUR, DO;  [facility];  Service: Endoscopy;;   rt foot sx Bilateral    s/p hysterectomy     SALPINGOOPHORECTOMY     UPPER GASTROINTESTINAL ENDOSCOPY  DEC 2010: DYSPEPSIA/DYSPHAGIA   SCH RING/ESO DIL 16 MM, GASTRITIS/DUODENITIS 2o to Aleve /on OMP PRN    Current Outpatient Medications  Medication Sig Dispense Refill   azelastine  (ASTELIN ) 0.1 % nasal spray Place 1 spray into both nostrils 2 (two) times daily as needed for rhinitis. Use in each nostril as directed     esomeprazole  (NEXIUM ) 40 MG capsule TAKE 1 CAPSULE (40 MG TOTAL) BY MOUTH 2 (TWO) TIMES DAILY BEFORE A MEAL. (Patient taking differently: Take 40 mg by mouth daily. Says she supposed to be on bid, but insurance only allows 30 day supply.) 180 capsule 3   levothyroxine  (SYNTHROID ) 50 MCG tablet Take 1 tablet (50 mcg total) by mouth every other day. 45 tablet 3   levothyroxine  (SYNTHROID ) 75 MCG tablet Take 1 tablet (75 mcg total) by mouth every other day. 45 tablet 3   No current facility-administered medications for this visit.    Allergies as of 07/18/2024 - Review Complete 07/18/2024  Allergen Reaction Noted   Aspirin Other (See Comments) 05/09/2014   Diazepam  Other (See Comments)    Prednisone Other (See Comments) 02/21/2020     Family History  Problem Relation Age of Onset   Pancreatic cancer Mother    Cancer Mother        pancreas   Diabetes Mother    Prostate cancer Father    Colon cancer Father        < 24 YO   Cancer Father        prostate and colon   Diabetes Sister        prediabetic   Pancreatic cancer Brother    Diabetes Brother    Cancer Brother        prostate    Heart disease Brother        CHF   Thyroid  disease Neg Hx     Social History   Socioeconomic History   Marital status: Divorced    Spouse name: Not on file   Number of children: Not on file   Years of education: Not on file   Highest education level: Not on file  Occupational History  Occupation: fulltime at [employer]   Tobacco Use   Smoking status: Never   Smokeless tobacco: Never   Tobacco comments:    Never smoked  Vaping Use   Vaping status: Never Used  Substance and Sexual Activity   Alcohol use: Not Currently    Comment: occasional, maybe once/year   Drug use: No   Sexual activity: Not Currently    Birth control/protection: None  Other Topics Concern   Not on file  Social History Narrative   Not on file   Social Drivers of Health   Tobacco Use: Low Risk (07/18/2024)   Patient History    Smoking Tobacco Use: Never    Smokeless Tobacco Use: Never    Passive Exposure: Not on file  Financial Resource Strain: Low Risk (05/27/2023)   Overall Financial Resource Strain (CARDIA)    Difficulty of Paying Living Expenses: Not hard at all  Food Insecurity: No Food Insecurity (05/27/2023)   Hunger Vital Sign    Worried About Running Out of Food in the Last Year: Never true    Ran Out of Food in the Last Year: Never true  Transportation Needs: No Transportation Needs (05/27/2023)   PRAPARE - Administrator, Civil Service (Medical): No    Lack of Transportation (Non-Medical): No  Physical Activity: Sufficiently Active (05/27/2023)   Exercise Vital Sign    Days of Exercise per Week:  5 days    Minutes of Exercise per Session: 30 min  Stress: No Stress Concern Present (05/27/2023)   [employer] of Occupational Health - Occupational Stress Questionnaire    Feeling of Stress : Not at all  Social Connections: Moderately Integrated (05/27/2023)   Social Connection and Isolation Panel    Frequency of Communication with Friends and Family: More than three times a week    Frequency of Social Gatherings with Friends and Family: Three times a week    Attends Religious Services: More than 4 times per year    Active Member of Clubs or Organizations: Yes    Attends Banker Meetings: More than 4 times per year    Marital Status: Divorced  Depression (PHQ2-9): Low Risk (03/03/2024)   Depression (PHQ2-9)    PHQ-2 Score: 0  Alcohol Screen: Low Risk (05/27/2023)   Alcohol Screen    Last Alcohol Screening Score (AUDIT): 0  Housing: Low Risk (05/27/2023)   Housing    Last Housing Risk Score: 0  Utilities: Not At Risk (05/27/2023)   AHC Utilities    Threatened with loss of utilities: No  Health Literacy: Adequate Health Literacy (05/27/2023)   B1300 Health Literacy    Frequency of need for help with medical instructions: Never    Subjective: Review of Systems  Constitutional:  Negative for chills and fever.  HENT:  Negative for congestion and hearing loss.   Eyes:  Negative for blurred vision and double vision.  Respiratory:  Negative for cough and shortness of breath.   Cardiovascular:  Negative for chest pain and palpitations.  Gastrointestinal:  Negative for abdominal pain, blood in stool, constipation, diarrhea, heartburn, melena and vomiting.  Genitourinary:  Negative for dysuria and urgency.  Musculoskeletal:  Negative for joint pain and myalgias.  Skin:  Negative for itching and rash.  Neurological:  Negative for dizziness and headaches.  Psychiatric/Behavioral:  Negative for depression. The patient is not nervous/anxious.      Objective: BP  133/82 (BP Location: Left Arm, Patient Position: Sitting, Cuff Size: Normal)  Pulse 76   Temp (!) 97.3 F (36.3 C) (Temporal)   Wt 152 lb 14.4 oz (69.4 kg)   BMI 31.96 kg/m  Physical Exam Constitutional:      Appearance: Normal appearance.  HENT:     Head: Normocephalic and atraumatic.  Eyes:     Extraocular Movements: Extraocular movements intact.     Conjunctiva/sclera: Conjunctivae normal.  Cardiovascular:     Rate and Rhythm: Normal rate and regular rhythm.  Pulmonary:     Effort: Pulmonary effort is normal.     Breath sounds: Normal breath sounds.  Abdominal:     General: Bowel sounds are normal.     Palpations: Abdomen is soft.  Musculoskeletal:        General: No swelling. Normal range of motion.     Cervical back: Normal range of motion and neck supple.  Skin:    General: Skin is warm and dry.     Coloration: Skin is not jaundiced.  Neurological:     General: No focal deficit present.     Mental Status: She is alert and oriented to person, place, and time.  Psychiatric:        Mood and Affect: Mood normal.        Behavior: Behavior normal.      Assessment/Plan:  1.  Chronic GERD-previously trialed and failed lansoprazole , omeprazole .  Dexilant  worked the best the cost is an issue.  Did not try samples of Voquezna after reading potential side effect profile.  Currently on esomeprazole  once daily relatively well-controlled.  Will continue.  2.  Constipation-recommended she go back on her align.  Amitiza  not covered by insurance.  3.  Lower quadrant abdominal pain, diarrhea-will order CT abdomen pelvis with contrast to evaluate for diverticulitis.  4.  Family history of pancreatic cancer-reports history in both her mother and brother.  No formal genetic testing.  Will order genetic testing at [facility] today.  Call with results.  May benefit from genetic counselor pending results.  Follow-up in 3   07/18/2024 10:12 AM   Disclaimer: This note was dictated with  voice recognition software. Similar sounding words can inadvertently be transcribed and may not be corrected upon review.  "

## 2024-07-27 ENCOUNTER — Telehealth (INDEPENDENT_AMBULATORY_CARE_PROVIDER_SITE_OTHER): Payer: Self-pay

## 2024-07-27 NOTE — Telephone Encounter (Signed)
 PA on Availity for CT abd/pelv: Procedure code 25822 does not require an authorization.

## 2024-07-27 NOTE — Telephone Encounter (Signed)
 ATC patient to give CT appt, no answer. LVM for call back. Sending appt note to mychart.

## 2024-08-05 ENCOUNTER — Other Ambulatory Visit (INDEPENDENT_AMBULATORY_CARE_PROVIDER_SITE_OTHER): Payer: Self-pay

## 2024-08-05 ENCOUNTER — Telehealth (INDEPENDENT_AMBULATORY_CARE_PROVIDER_SITE_OTHER): Payer: Self-pay

## 2024-08-05 DIAGNOSIS — Z800 Family history of malignant neoplasm of digestive organs: Secondary | ICD-10-CM

## 2024-08-05 DIAGNOSIS — Z860100 Personal history of colon polyps, unspecified: Secondary | ICD-10-CM

## 2024-08-05 LAB — VISTASEQ PANCREATIC CANCER

## 2024-08-05 NOTE — Telephone Encounter (Signed)
 Testing reordered to submit with clinical information, so [facility] can get approved before having the lab drawn.

## 2024-08-05 NOTE — Telephone Encounter (Signed)
"   I spoke with the patient made her aware she will have to sign a consent from [facility] to have the cancelled genetic testing approved by insurance before we have her redraw it. Patient aware and will come by the office to sign this on 08/08/2024.  "

## 2024-08-05 NOTE — Telephone Encounter (Signed)
 Per Ricka FALCON, with [employer] [phone] they had to cancel the genetic Pancreatic cancer testing as they state it needs a Prior Authorization. Patient had this lab drawn on 07/18/2024, they say they tried to do a retro Prior authorization on this patient's blood test,but they were unable too, as the patient insurance is requiring we submit a prior authorization prior to the lab being drawn.   They will fax us  a form to fill out with the patient family history, and the need for  medical necessity, we will need to fill this out and submit her clinical records stating the medical necessity for this test including the family history, we will have to reorder the test, and email all of this back to them at [email or web address], including the printed out order. In the subject line, we will need to place in capital letters [place] ONLY (no sample). Once they get this information they will submit to the patient's insurance and will let us  know if approved and then the patient will have to go back to have redrawn if approved.

## 2024-08-09 ENCOUNTER — Ambulatory Visit (HOSPITAL_COMMUNITY)

## 2025-07-10 ENCOUNTER — Ambulatory Visit: Admitting: Internal Medicine
